# Patient Record
Sex: Male | Born: 1965 | ZIP: ~
Health system: Southern US, Community
[De-identification: ages and names within clinical notes are randomized; demographics above are authoritative.]

## PROBLEM LIST (undated history)

## (undated) DIAGNOSIS — K219 Gastro-esophageal reflux disease without esophagitis: Secondary | ICD-10-CM

## (undated) DIAGNOSIS — H35719 Central serous chorioretinopathy, unspecified eye: Secondary | ICD-10-CM

## (undated) DIAGNOSIS — J189 Pneumonia, unspecified organism: Secondary | ICD-10-CM

## (undated) DIAGNOSIS — E785 Hyperlipidemia, unspecified: Secondary | ICD-10-CM

## (undated) DIAGNOSIS — E859 Amyloidosis, unspecified: Secondary | ICD-10-CM

## (undated) DIAGNOSIS — I82890 Acute embolism and thrombosis of other specified veins: Secondary | ICD-10-CM

## (undated) DIAGNOSIS — R011 Cardiac murmur, unspecified: Secondary | ICD-10-CM

## (undated) DIAGNOSIS — H04129 Dry eye syndrome of unspecified lacrimal gland: Secondary | ICD-10-CM

## (undated) DIAGNOSIS — N289 Disorder of kidney and ureter, unspecified: Secondary | ICD-10-CM

## (undated) DIAGNOSIS — D759 Disease of blood and blood-forming organs, unspecified: Secondary | ICD-10-CM

## (undated) DIAGNOSIS — I10 Essential (primary) hypertension: Secondary | ICD-10-CM

## (undated) HISTORY — DX: Dry eye syndrome of unspecified lacrimal gland: H04.129

## (undated) HISTORY — DX: Central serous chorioretinopathy, unspecified eye: H35.719

## (undated) HISTORY — PX: EXPLORATORY LAPAROTOMY: SUR591

## (undated) HISTORY — PX: TONSILLECTOMY: SUR1361

## (undated) HISTORY — DX: Hyperlipidemia, unspecified: E78.5

---

## 2014-04-06 ENCOUNTER — Emergency Department: Payer: Self-pay | Admitting: Emergency Medicine

## 2014-04-06 LAB — BASIC METABOLIC PANEL
Anion Gap: 6 — ABNORMAL LOW (ref 7–16)
BUN: 39 mg/dL — AB (ref 7–18)
CALCIUM: 8.4 mg/dL — AB (ref 8.5–10.1)
Chloride: 111 mmol/L — ABNORMAL HIGH (ref 98–107)
Co2: 24 mmol/L (ref 21–32)
Creatinine: 2.76 mg/dL — ABNORMAL HIGH (ref 0.60–1.30)
EGFR (Non-African Amer.): 26 — ABNORMAL LOW
GFR CALC AF AMER: 32 — AB
Glucose: 104 mg/dL — ABNORMAL HIGH (ref 65–99)
Osmolality: 291 (ref 275–301)
Potassium: 5.2 mmol/L — ABNORMAL HIGH (ref 3.5–5.1)
Sodium: 141 mmol/L (ref 136–145)

## 2014-04-06 LAB — CBC WITH DIFFERENTIAL/PLATELET
BASOS ABS: 0.1 10*3/uL (ref 0.0–0.1)
Basophil %: 1.3 %
EOS ABS: 0.6 10*3/uL (ref 0.0–0.7)
Eosinophil %: 7.1 %
HCT: 30.7 % — ABNORMAL LOW (ref 40.0–52.0)
HGB: 9.9 g/dL — AB (ref 13.0–18.0)
Lymphocyte #: 2 10*3/uL (ref 1.0–3.6)
Lymphocyte %: 25.1 %
MCH: 27.2 pg (ref 26.0–34.0)
MCHC: 32.2 g/dL (ref 32.0–36.0)
MCV: 84 fL (ref 80–100)
MONOS PCT: 9 %
Monocyte #: 0.7 x10 3/mm (ref 0.2–1.0)
NEUTROS PCT: 57.5 %
Neutrophil #: 4.6 10*3/uL (ref 1.4–6.5)
PLATELETS: 273 10*3/uL (ref 150–440)
RBC: 3.64 10*6/uL — ABNORMAL LOW (ref 4.40–5.90)
RDW: 16 % — ABNORMAL HIGH (ref 11.5–14.5)
WBC: 8.1 10*3/uL (ref 3.8–10.6)

## 2014-04-06 LAB — MAGNESIUM: MAGNESIUM: 1.8 mg/dL

## 2014-04-06 LAB — PHOSPHORUS: Phosphorus: 3.9 mg/dL (ref 2.5–4.9)

## 2014-06-26 HISTORY — PX: IMPLANTATION / PLACEMENT PERMANENT EPIDURAL CATHETER: SUR689

## 2014-08-25 ENCOUNTER — Ambulatory Visit: Payer: Self-pay | Admitting: Nephrology

## 2014-08-28 ENCOUNTER — Ambulatory Visit (INDEPENDENT_AMBULATORY_CARE_PROVIDER_SITE_OTHER): Payer: BLUE CROSS/BLUE SHIELD | Admitting: Internal Medicine

## 2014-08-28 DIAGNOSIS — Z7189 Other specified counseling: Secondary | ICD-10-CM

## 2014-08-28 DIAGNOSIS — Z7184 Encounter for health counseling related to travel: Secondary | ICD-10-CM | POA: Insufficient documentation

## 2014-08-28 DIAGNOSIS — Z23 Encounter for immunization: Secondary | ICD-10-CM | POA: Diagnosis not present

## 2014-08-28 MED ORDER — DEXTRAN 70-DEXTROSE SOLN: 1.0000 mL | Freq: Once | Status: DC

## 2014-08-28 MED ORDER — PANTOPRAZOLE SODIUM 40 MG PO TBEC: 40.0000 mg | DELAYED_RELEASE_TABLET | Freq: Every day | ORAL | Status: DC

## 2014-08-28 MED ORDER — ROSUVASTATIN CALCIUM 10 MG PO TABS: 10.0000 mg | ORAL_TABLET | Freq: Every day | ORAL | Status: DC

## 2014-08-28 MED ORDER — CYCLOSPORINE 0.05 % OP EMUL: 1.0000 [drp] | Freq: Two times a day (BID) | OPHTHALMIC | Status: DC

## 2014-08-28 MED ORDER — FERROUS SULFATE 325 (65 FE) MG PO TABS: 325.0000 mg | ORAL_TABLET | Freq: Three times a day (TID) | ORAL | Status: DC

## 2014-08-28 MED ORDER — IRBESARTAN 150 MG PO TABS: 150.0000 mg | ORAL_TABLET | Freq: Every day | ORAL | Status: DC

## 2014-08-28 MED ORDER — AZITHROMYCIN 500 MG PO TABS: 1000.0000 mg | ORAL_TABLET | Freq: Once | ORAL | Status: DC

## 2014-08-28 NOTE — Patient Instructions (Signed)
[facility]                [address], [place]                   [place], [place] [address]                      Phone: [phone]                        Fax: [phone]   Planned departure date: August 29, 2014          Planned return date: September 12, 2014 Countries of travel: [place]   Guidelines for the Prevention & Treatment of Traveler's Diarrhea  Prevention: "Boil it, Peel it, Lacinda Axon it, or Forget it"   the fewer chances -> lower risk: try to stick to food & water precautions as much as possible"   If it's "piping hot"; it is probably okay, if not, it may not be   Treatment   1) You should always take care to drink lots of fluids in order to avoid dehydration   2) You should bring medications with you in case you come down with a case of diarrhea   3) OTC = bring pepto-bismol - can take with initial abdominal symptoms;                    Imodium - can help slow down your intestinal tract, can help relief cramps                    and diarrhea, can take if no bloody diarrhea  Use azithromycin if needed for traveler's diarrhea  Guidelines for the Prevention of Malaria  Avoidance:  -fewer mosquito bites = lower risk. Mosquitos can bite at night as well as daytime  -cover up (long sleeve clothing), mosquito nets, screens  -Insect repellent for your skin ( DEET containing lotion > 20%): for clothes ( permethrin spray)    medication not indicated for malaria prevention.   Immunizations received today: Typhoid (parenteral)  Future immunizations, if indicated none indicated   Prior to travel:  1) Be sure to pick up appropriate prescriptions, including medicine you take daily. Do not expect to be able to fill your prescriptions abroad.  2) Strongly consider obtaining traveler's insurance, including emergency evacuation insurance. Most plans in the [place] do not cover participants abroad. (see below for resources)  3) Register at  the appropriate [place] embassy or consulate with travel dates so they are aware of your presence in-country and for helpful advice during travel using the [employer] (STEP, GreenNylon.com.cy).  4) Leave contact information with a relative or friend.  5) Keep a Research officer, political party, credit cards in case they become lost or stolen  6) Inform your credit card company that you will be travelling abroad   During travel:  1) If you become ill and need medical advice, the [place] [employer]	[place] of the country you are traveling in general provides a list of [place] speaking doctors.  We are also available on MyChart for remote consultation if you register prior to travel. 2) Avoid motorcycles or scooters when at all possible. Traffic laws in many countries are lax and accidents occur frequently.  3) Do not take any unnecessary risks that you wouldn't do at home.   Resources:  -Country specific information:  BlindResource.ca or GreenNylon.com.cy  -Travel Supplies (DEET, mosquito nets): [employer], [employer] store, [employer], [place] insurance options: [place].com; http://clayton-rivera.info/; travelguard.com or [employer], gninsurance.com or info@gninsurance .com, H1235423.   Post Travel:  If you return from your trip ill, call your primary care doctor or our travel clinic @ [phone].   Enjoy your trip and know that with proper pre-travel preparation, most people have an enjoyable and uninterrupted trip!

## 2014-08-28 NOTE — Progress Notes (Signed)
Subjective:   Joseph [name] is a 49 y.o. male who presents to the Infectious Disease clinic for travel consultation. Planned departure date: March 5          Planned return date: March 19 Countries of travel: [place] Areas in country: rural   Accommodations: hotel Purpose of travel: vacation Prior travel out of [place]: yes     Objective:   Medications:reviewed    Assessment:    No contraindications to travel. none     Plan:    Issues discussed: altitude illness, environmental concerns, future shots, insect-borne illnesses, Japanese encephalitis, malaria, MVA safety, rabies, safe food/water, traveler's diarrhea, website/handouts for more information, what to do if ill upon return and what to do if ill while there. Immunizations recommended: Typhoid (parenteral). Malaria prophylaxis: not indicated Traveler's diarrhea prophylaxis: azithromycin. Total duration of visit: 1 Hour. Total time spent on education, counseling, coordination of care: 30 Minutes.

## 2015-03-22 ENCOUNTER — Other Ambulatory Visit (HOSPITAL_COMMUNITY): Payer: Self-pay | Admitting: Nephrology

## 2015-03-22 DIAGNOSIS — R809 Proteinuria, unspecified: Secondary | ICD-10-CM

## 2015-03-22 DIAGNOSIS — N184 Chronic kidney disease, stage 4 (severe): Secondary | ICD-10-CM

## 2015-04-05 ENCOUNTER — Encounter
Admission: RE | Admit: 2015-04-05 | Discharge: 2015-04-05 | Disposition: A | Discharge status: Home / Self Care | Payer: BLUE CROSS/BLUE SHIELD | Source: Ambulatory Visit | Attending: Nephrology | Admitting: Nephrology

## 2015-04-05 LAB — DIFFERENTIAL
Basophils Absolute: 0.1 10*3/uL (ref 0–0.1)
Basophils Relative: 1 %
EOS PCT: 7 %
Eosinophils Absolute: 0.7 10*3/uL (ref 0–0.7)
LYMPHS ABS: 2.5 10*3/uL (ref 1.0–3.6)
LYMPHS PCT: 26 %
MONO ABS: 0.6 10*3/uL (ref 0.2–1.0)
MONOS PCT: 7 %
Neutro Abs: 5.6 10*3/uL (ref 1.4–6.5)
Neutrophils Relative %: 59 %

## 2015-04-05 LAB — URINALYSIS COMPLETE WITH MICROSCOPIC (ARMC ONLY)
BACTERIA UA: NONE SEEN
Bilirubin Urine: NEGATIVE
GLUCOSE, UA: NEGATIVE mg/dL
Hgb urine dipstick: NEGATIVE
Ketones, ur: NEGATIVE mg/dL
Leukocytes, UA: NEGATIVE
Nitrite: NEGATIVE
PROTEIN: 100 mg/dL — AB
SQUAMOUS EPITHELIAL / LPF: NONE SEEN
Specific Gravity, Urine: 1.011 (ref 1.005–1.030)
pH: 5 (ref 5.0–8.0)

## 2015-04-05 LAB — COMPREHENSIVE METABOLIC PANEL
ALT: 29 U/L (ref 17–63)
AST: 20 U/L (ref 15–41)
Albumin: 4.1 g/dL (ref 3.5–5.0)
Alkaline Phosphatase: 129 U/L — ABNORMAL HIGH (ref 38–126)
Anion gap: 4 — ABNORMAL LOW (ref 5–15)
BUN: 49 mg/dL — AB (ref 6–20)
CHLORIDE: 114 mmol/L — AB (ref 101–111)
CO2: 23 mmol/L (ref 22–32)
Calcium: 9.3 mg/dL (ref 8.9–10.3)
Creatinine, Ser: 2.79 mg/dL — ABNORMAL HIGH (ref 0.61–1.24)
GFR calc non Af Amer: 25 mL/min — ABNORMAL LOW (ref >60)
GFR, EST AFRICAN AMERICAN: 29 mL/min — AB (ref >60)
Glucose, Bld: 106 mg/dL — ABNORMAL HIGH (ref 65–99)
Potassium: 5.3 mmol/L — ABNORMAL HIGH (ref 3.5–5.1)
Sodium: 141 mmol/L (ref 135–145)
Total Bilirubin: 0.5 mg/dL (ref 0.3–1.2)
Total Protein: 7.5 g/dL (ref 6.5–8.1)

## 2015-04-05 LAB — CBC
HEMATOCRIT: 32 % — AB (ref 40.0–52.0)
Hemoglobin: 10.2 g/dL — ABNORMAL LOW (ref 13.0–18.0)
MCH: 27.5 pg (ref 26.0–34.0)
MCHC: 31.8 g/dL — ABNORMAL LOW (ref 32.0–36.0)
MCV: 86.6 fL (ref 80.0–100.0)
PLATELETS: 246 10*3/uL (ref 150–440)
RBC: 3.7 MIL/uL — AB (ref 4.40–5.90)
RDW: 15 % — ABNORMAL HIGH (ref 11.5–14.5)
WBC: 9.4 10*3/uL (ref 3.8–10.6)

## 2015-04-05 LAB — PROTEIN / CREATININE RATIO, URINE
Creatinine, Urine: 111 mg/dL
PROTEIN CREATININE RATIO: 0.89 mg/mg{creat} — AB (ref 0.00–0.15)
Total Protein, Urine: 99 mg/dL

## 2015-04-05 LAB — PROTIME-INR
INR: 1.04
Prothrombin Time: 13.8 seconds (ref 11.4–15.0)

## 2015-04-05 LAB — ABO/RH: ABO/RH(D): O POS

## 2015-04-06 ENCOUNTER — Observation Stay: Payer: BLUE CROSS/BLUE SHIELD

## 2015-04-06 ENCOUNTER — Inpatient Hospital Stay
Admission: AD | Admit: 2015-04-06 | Discharge: 2015-04-21 | DRG: 907 | Disposition: A | Discharge status: Home / Self Care | Payer: BLUE CROSS/BLUE SHIELD | Source: Ambulatory Visit | Attending: Internal Medicine | Admitting: Internal Medicine

## 2015-04-06 DIAGNOSIS — N179 Acute kidney failure, unspecified: Secondary | ICD-10-CM | POA: Diagnosis present

## 2015-04-06 DIAGNOSIS — R109 Unspecified abdominal pain: Secondary | ICD-10-CM

## 2015-04-06 DIAGNOSIS — D65 Disseminated intravascular coagulation [defibrination syndrome]: Secondary | ICD-10-CM | POA: Diagnosis not present

## 2015-04-06 DIAGNOSIS — S37002A Unspecified injury of left kidney, initial encounter: Secondary | ICD-10-CM

## 2015-04-06 DIAGNOSIS — E875 Hyperkalemia: Secondary | ICD-10-CM | POA: Diagnosis present

## 2015-04-06 DIAGNOSIS — N059 Unspecified nephritic syndrome with unspecified morphologic changes: Secondary | ICD-10-CM

## 2015-04-06 DIAGNOSIS — E859 Amyloidosis, unspecified: Secondary | ICD-10-CM

## 2015-04-06 DIAGNOSIS — R809 Proteinuria, unspecified: Secondary | ICD-10-CM

## 2015-04-06 DIAGNOSIS — E872 Acidosis: Secondary | ICD-10-CM | POA: Diagnosis present

## 2015-04-06 DIAGNOSIS — Y838 Other surgical procedures as the cause of abnormal reaction of the patient, or of later complication, without mention of misadventure at the time of the procedure: Secondary | ICD-10-CM | POA: Diagnosis present

## 2015-04-06 DIAGNOSIS — Z8711 Personal history of peptic ulcer disease: Secondary | ICD-10-CM

## 2015-04-06 DIAGNOSIS — I120 Hypertensive chronic kidney disease with stage 5 chronic kidney disease or end stage renal disease: Secondary | ICD-10-CM | POA: Diagnosis present

## 2015-04-06 DIAGNOSIS — Z881 Allergy status to other antibiotic agents status: Secondary | ICD-10-CM

## 2015-04-06 DIAGNOSIS — E785 Hyperlipidemia, unspecified: Secondary | ICD-10-CM | POA: Diagnosis present

## 2015-04-06 DIAGNOSIS — I10 Essential (primary) hypertension: Secondary | ICD-10-CM | POA: Diagnosis not present

## 2015-04-06 DIAGNOSIS — N184 Chronic kidney disease, stage 4 (severe): Secondary | ICD-10-CM

## 2015-04-06 DIAGNOSIS — S37019A Minor contusion of unspecified kidney, initial encounter: Secondary | ICD-10-CM | POA: Diagnosis present

## 2015-04-06 DIAGNOSIS — J90 Pleural effusion, not elsewhere classified: Secondary | ICD-10-CM

## 2015-04-06 DIAGNOSIS — N99840 Postprocedural hematoma of a genitourinary system organ or structure following a genitourinary system procedure: Principal | ICD-10-CM | POA: Diagnosis present

## 2015-04-06 DIAGNOSIS — R05 Cough: Secondary | ICD-10-CM

## 2015-04-06 DIAGNOSIS — D631 Anemia in chronic kidney disease: Secondary | ICD-10-CM | POA: Diagnosis present

## 2015-04-06 DIAGNOSIS — Z87891 Personal history of nicotine dependence: Secondary | ICD-10-CM

## 2015-04-06 DIAGNOSIS — D62 Acute posthemorrhagic anemia: Secondary | ICD-10-CM | POA: Diagnosis not present

## 2015-04-06 DIAGNOSIS — R0902 Hypoxemia: Secondary | ICD-10-CM

## 2015-04-06 DIAGNOSIS — R739 Hyperglycemia, unspecified: Secondary | ICD-10-CM | POA: Diagnosis not present

## 2015-04-06 DIAGNOSIS — R578 Other shock: Secondary | ICD-10-CM | POA: Diagnosis not present

## 2015-04-06 DIAGNOSIS — R059 Cough, unspecified: Secondary | ICD-10-CM

## 2015-04-06 DIAGNOSIS — E854 Organ-limited amyloidosis: Secondary | ICD-10-CM | POA: Diagnosis present

## 2015-04-06 DIAGNOSIS — N186 End stage renal disease: Secondary | ICD-10-CM | POA: Diagnosis present

## 2015-04-06 DIAGNOSIS — S37012A Minor contusion of left kidney, initial encounter: Secondary | ICD-10-CM | POA: Diagnosis present

## 2015-04-06 HISTORY — PX: RENAL BIOPSY, PERCUTANEOUS: SUR144

## 2015-04-06 HISTORY — DX: Essential (primary) hypertension: I10

## 2015-04-06 HISTORY — DX: Cardiac murmur, unspecified: R01.1

## 2015-04-06 LAB — CBC
HCT: 21.3 % — ABNORMAL LOW (ref 40.0–52.0)
HEMATOCRIT: 28.8 % — AB (ref 40.0–52.0)
HEMOGLOBIN: 9.2 g/dL — AB (ref 13.0–18.0)
Hemoglobin: 6.6 g/dL — ABNORMAL LOW (ref 13.0–18.0)
MCH: 27.5 pg (ref 26.0–34.0)
MCH: 27.3 pg (ref 26.0–34.0)
MCHC: 31.8 g/dL — ABNORMAL LOW (ref 32.0–36.0)
MCHC: 31 g/dL — AB (ref 32.0–36.0)
MCV: 86.4 fL (ref 80.0–100.0)
MCV: 88 fL (ref 80.0–100.0)
PLATELETS: 190 10*3/uL (ref 150–440)
Platelets: 244 10*3/uL (ref 150–440)
RBC: 3.34 MIL/uL — AB (ref 4.40–5.90)
RBC: 2.42 MIL/uL — ABNORMAL LOW (ref 4.40–5.90)
RDW: 15.3 % — AB (ref 11.5–14.5)
RDW: 15.3 % — AB (ref 11.5–14.5)
WBC: 18.6 10*3/uL — AB (ref 3.8–10.6)
WBC: 15.6 10*3/uL — ABNORMAL HIGH (ref 3.8–10.6)

## 2015-04-06 LAB — PREPARE RBC (CROSSMATCH)

## 2015-04-06 MED ORDER — HYDROMORPHONE HCL 1 MG/ML IJ SOLN
1.0000 mg | INTRAMUSCULAR | Status: DC | PRN
  Administered 2015-04-06: 16:00:00 1 mg via INTRAVENOUS
  Filled 2015-04-06: qty 1

## 2015-04-06 MED ORDER — SODIUM CHLORIDE 0.9 % IV SOLN
Freq: Once | INTRAVENOUS | Status: AC
  Administered 2015-04-06: 23:00:00 via INTRAVENOUS

## 2015-04-06 MED ORDER — MORPHINE SULFATE (PF) 2 MG/ML IV SOLN: 2.0000 mg | INTRAVENOUS | Status: DC | PRN

## 2015-04-06 MED ORDER — ONDANSETRON HCL 4 MG/2ML IJ SOLN
4.0000 mg | Freq: Three times a day (TID) | INTRAMUSCULAR | Status: DC | PRN
  Administered 2015-04-06 (×2): 4 mg via INTRAVENOUS
  Administered 2015-04-07: 8 mg via INTRAVENOUS
  Administered 2015-04-07: 4 mg via INTRAVENOUS
  Filled 2015-04-06 (×3): qty 2

## 2015-04-06 MED ORDER — SODIUM CHLORIDE 0.9 % IV SOLN
INTRAVENOUS | Status: DC
  Administered 2015-04-06: 10:00:00 via INTRAVENOUS

## 2015-04-06 MED ORDER — MORPHINE SULFATE (PF) 2 MG/ML IV SOLN
INTRAVENOUS | Status: AC
  Filled 2015-04-06: qty 1

## 2015-04-06 MED ORDER — ACETAMINOPHEN 500 MG PO TABS: 500.0000 mg | ORAL_TABLET | Freq: Four times a day (QID) | ORAL | Status: DC | PRN

## 2015-04-06 MED ORDER — SODIUM CHLORIDE 0.9 % IV BOLUS (SEPSIS)
1000.0000 mL | Freq: Once | INTRAVENOUS | Status: AC
  Administered 2015-04-06: 22:00:00 1000 mL via INTRAVENOUS

## 2015-04-06 MED ORDER — HYDROMORPHONE HCL 1 MG/ML IJ SOLN
1.0000 mg | INTRAMUSCULAR | Status: DC | PRN
  Filled 2015-04-06: qty 2

## 2015-04-06 MED ORDER — SODIUM CHLORIDE 0.9 % IV SOLN
INTRAVENOUS | Status: DC
  Administered 2015-04-06: 15:00:00 via INTRAVENOUS

## 2015-04-06 MED ORDER — MORPHINE SULFATE (PF) 2 MG/ML IV SOLN
2.0000 mg | INTRAVENOUS | Status: DC | PRN
  Administered 2015-04-06: 2 mg via INTRAVENOUS

## 2015-04-06 MED ORDER — MORPHINE SULFATE (PF) 4 MG/ML IV SOLN
INTRAVENOUS | Status: AC
  Filled 2015-04-06: qty 1

## 2015-04-06 MED ORDER — ACETAMINOPHEN-CODEINE #3 300-30 MG PO TABS
1.0000 | ORAL_TABLET | ORAL | Status: DC | PRN
Start: 2015-04-06 — End: 2015-04-06
  Administered 2015-04-06: 13:00:00 2 via ORAL
  Filled 2015-04-06: qty 2

## 2015-04-06 MED ORDER — OXYCODONE-ACETAMINOPHEN 5-325 MG PO TABS
1.0000 | ORAL_TABLET | ORAL | Status: DC | PRN
  Administered 2015-04-10 – 2015-04-14 (×3): 1 via ORAL
  Administered 2015-04-15 (×2): 2 via ORAL
  Administered 2015-04-15: 1 via ORAL
  Administered 2015-04-15 – 2015-04-17 (×9): 2 via ORAL
  Administered 2015-04-17: 1 via ORAL
  Administered 2015-04-18 – 2015-04-20 (×12): 2 via ORAL
  Administered 2015-04-20: 1 via ORAL
  Administered 2015-04-20 – 2015-04-21 (×4): 2 via ORAL
  Filled 2015-04-06 (×5): qty 2
  Filled 2015-04-06: qty 1
  Filled 2015-04-06 (×4): qty 2
  Filled 2015-04-06: qty 1
  Filled 2015-04-06 (×10): qty 2
  Filled 2015-04-06: qty 1
  Filled 2015-04-06 (×8): qty 2
  Filled 2015-04-06: qty 1
  Filled 2015-04-06 (×2): qty 2

## 2015-04-06 MED ORDER — HYDROMORPHONE HCL 1 MG/ML IJ SOLN
1.0000 mg | INTRAMUSCULAR | Status: DC | PRN
  Administered 2015-04-07 – 2015-04-15 (×15): 1 mg via INTRAVENOUS
  Filled 2015-04-06 (×17): qty 1

## 2015-04-06 MED ORDER — MORPHINE SULFATE (PF) 4 MG/ML IV SOLN: 4.0000 mg | INTRAVENOUS | Status: DC | PRN

## 2015-04-06 MED ORDER — HYDROMORPHONE HCL 1 MG/ML IJ SOLN
1.5000 mg | INTRAMUSCULAR | Status: DC | PRN
  Administered 2015-04-06 (×3): 1.5 mg via INTRAVENOUS
  Filled 2015-04-06 (×3): qty 2

## 2015-04-06 NOTE — Consult Note (Signed)
[facility] NAME: Joseph [name]    MR#:  [id number]  DATE OF BIRTH:  21-Dec-1965  DATE OF ADMISSION:  04/06/2015  PRIMARY CARE PHYSICIAN: Pcp Not In System   REQUESTING/REFERRING PHYSICIAN: Lateef  CHIEF COMPLAINT:  No chief complaint on file. Hypotension after renal biopsy  HISTORY OF PRESENT ILLNESS:  Joseph [name]  is a 49 y.o. male with a known history of essential hypertension who originally presented 10/11 for routine renal biopsy. Underwent procedure without immediate complication however, unfortunately developed pain in the left flank CT imaging revealed expanding subcapsular hematoma. They had difficulty managing his pain throughout the day until he finally ended on 1.5 mg of Dilaudid IV every 2 hours. Fortunately this controlled his pain however he is now hypotensive.Evaluated At bedside he is currently without any complaints  PAST MEDICAL HISTORY:   Past Medical History  Diagnosis Date  . Allergy   . Heart murmur   . Hypertension     PAST SURGICAL HISTOIRY:   Past Surgical History  Procedure Laterality Date  . Renal biopsy, percutaneous  04/06/2015         SOCIAL HISTORY:   Social History  Substance Use Topics  . Smoking status: Former Smoker    Quit date: 11/04/2006  . Smokeless tobacco: Not on file  . Alcohol Use: Not on file    FAMILY HISTORY:  History reviewed. No pertinent family history.  DRUG ALLERGIES:   Allergies  Allergen Reactions  . Ciprofloxacin Hcl   . Doxycycline Hyclate     REVIEW OF SYSTEMS:  CONSTITUTIONAL: No fever, fatigue or weakness.  EYES: No blurred or double vision.  EARS, NOSE, AND THROAT: No tinnitus or ear pain.  RESPIRATORY: No cough, shortness of breath, wheezing or hemoptysis.  CARDIOVASCULAR: No chest pain, orthopnea, edema.  GASTROINTESTINAL: No nausea, vomiting, diarrhea or abdominal pain.  GENITOURINARY: No dysuria, hematuria.  ENDOCRINE: No polyuria,  nocturia,  HEMATOLOGY: No anemia, easy bruising or bleeding SKIN: No rash or lesion. MUSCULOSKELETAL: No joint pain or arthritis.   NEUROLOGIC: No tingling, numbness, weakness.  PSYCHIATRY: No anxiety or depression.   MEDICATIONS AT HOME:   Prior to Admission medications   Medication Sig Start Date End Date Taking? Authorizing Provider  cycloSPORINE (RESTASIS) 0.05 % ophthalmic emulsion Place 1 drop into both eyes 2 (two) times daily. 08/28/14  Yes Thayer Headings, MD  ferrous sulfate 325 (65 FE) MG tablet Take 1 tablet (325 mg total) by mouth 3 (three) times daily with meals. 08/28/14  Yes Thayer Headings, MD  irbesartan (AVAPRO) 150 MG tablet Take 1 tablet (150 mg total) by mouth daily. 08/28/14  Yes Thayer Headings, MD  pantoprazole (PROTONIX) 40 MG tablet Take 1 tablet (40 mg total) by mouth daily. 08/28/14  Yes Thayer Headings, MD  Patiromer Sorbitex Calcium 25.2 G PACK Take 25.2 mg by mouth 1 day or 1 dose.   Yes Historical Provider, MD  rosuvastatin (CRESTOR) 10 MG tablet Take 1 tablet (10 mg total) by mouth daily. 08/28/14  Yes Thayer Headings, MD  sodium bicarbonate 650 MG tablet Take 650 mg by mouth 4 (four) times daily.   Yes Historical Provider, MD      VITAL SIGNS:  Blood pressure 76/41, pulse 92, temperature 97.8 F (36.6 C), temperature source Oral, resp. rate 20, height 5\' 11"  (1.803 m), weight 198 lb (89.812 kg), SpO2 94 %.  PHYSICAL EXAMINATION:  GENERAL:  49 y.o.-year-old patient lying  in the bed with no acute distress.  EYES: Pupils equal, round, reactive to light and accommodation. No scleral icterus. Extraocular muscles intact.  HEENT: Head atraumatic, normocephalic. Oropharynx and nasopharynx clear.  NECK:  Supple, no jugular venous distention. No thyroid enlargement, no tenderness.  LUNGS: Normal breath sounds bilaterally, no wheezing, rales,rhonchi or crepitation. No use of accessory muscles of respiration.  CARDIOVASCULAR: S1, S2 normal. No murmurs, rubs, or gallops.   ABDOMEN: Soft, tender as expected without rebound or guarding, nondistended. Bowel sounds present. No organomegaly or mass.  EXTREMITIES: No pedal edema, cyanosis, or clubbing.  NEUROLOGIC: Cranial nerves II through XII are intact. Muscle strength 5/5 in all extremities. Sensation intact. Gait not checked.  PSYCHIATRIC: The patient is alert and oriented x 3.  SKIN: No obvious rash, lesion, or ulcer.   LABORATORY PANEL:   CBC  Recent Labs Lab 04/06/15 1458  WBC 18.6*  HGB 9.2*  HCT 28.8*  PLT 244   ------------------------------------------------------------------------------------------------------------------  Chemistries   Recent Labs Lab 04/05/15 0838  NA 141  K 5.3*  CL 114*  CO2 23  GLUCOSE 106*  BUN 49*  CREATININE 2.79*  CALCIUM 9.3  AST 20  ALT 29  ALKPHOS 129*  BILITOT 0.5   ------------------------------------------------------------------------------------------------------------------  Cardiac Enzymes No results for input(s): TROPONINI in the last 168 hours. ------------------------------------------------------------------------------------------------------------------  RADIOLOGY:  Ct Abdomen Pelvis Wo Contrast  04/06/2015   CLINICAL DATA:  Left sided severe pain after percutaneous renal biopsy today  EXAM: CT ABDOMEN AND PELVIS WITHOUT CONTRAST  TECHNIQUE: Multidetector CT imaging of the abdomen and pelvis was performed following the standard protocol without IV contrast.  COMPARISON:  Ultrasound from earlier the same day  FINDINGS: There is a moderately large mixed attenuation left subcapsular hematoma around the kidney laterally from upper to lower pole, measuring approximately 3.2 cm maximum thickness, resulting in mild compression of the left renal parenchyma. There are streaky left perinephric retroperitoneal opacities, extending primarily medially and superiorly toward the adrenal gland.  Visualized lung bases clear. Unremarkable liver,  gallbladder, spleen, pancreas, adrenal glands. Unenhanced CT was performed per clinician order. Lack of IV contrast limits sensitivity and specificity, especially for evaluation of abdominal/pelvic solid viscera. There is a fluid attenuation exophytic 3.9 lesion from the upper pole right kidney presumably renal cyst but incompletely characterized. No hydronephrosis. Stomach, small bowel, and colon are nondilated. Urinary bladder physiologically distended. Mild prostatic enlargement. Bilateral pelvic phleboliths. Scattered aortoiliac arterial plaque without aneurysm. Increased number of prominent para-aortic and aortocaval lymph nodes, none greater than 1 cm short axis diameter. No ascites. No free air. Minimal spondylitic changes in the lumbar spine. Prominent protrusion L1-2.  IMPRESSION: 1. Moderately large left subcapsular hematoma with mild compression of the left renal parenchyma. 2. Para-aortic and aortocaval retroperitoneal adenopathy, possibly reactive but nonspecific ; lymphoproliferative disease could have similar appearance. Critical Value/emergent results were called by telephone at the time of interpretation on 04/06/2015 at 3:11 pm to Dr. Inocente Salles LATEEF , who verbally acknowledged these results.   Electronically Signed   By: Lucrezia Europe M.D.   On: 04/06/2015 15:13   US Biopsy-no Radiologist  04/06/2015   CLINICAL DATA:    Ultrasound was utilized for biopsy by the requesting physician.     EKG:   Orders placed or performed in visit on 04/06/14  . EKG 12-Lead    IMPRESSION AND PLAN:   49 year old Caucasian gentleman history of essential hypertension presenting for routine kidney biopsy complicated by subcapsular hematoma  1. Subcapsular hematoma with hypotension:  Blood pressure is responsive to fluid bolus will continue IV fluids, follow blood pressure trend repeat CBC checking for worsening anemia. The fact that his pain is now well controlled and seems reassuring this is not worsening  of his bleeding. However if becomes unresponsive to IV fluids or drastically different CBC will have to repeat a CAT scan and possibly involve vascular surgery. In the meantime decrease the dose just pain medications as previously stated frontal water 1.5 mg 1 mg as he is narcotic nave. If pain control does become an issue started on PCA pump  2. GERD without esophagitis Protonix 3. Hyperlipidemia unspecified statin therapy 4. Venous thromboembolism prophylactic: SCDs avoid heparin given active bleed  All the records are reviewed and case discussed with Consulting provider. Management plans discussed with the patient, family and they are in agreement.  CODE STATUS: Full  TOTAL TIME TAKING CARE OF THIS PATIENT: 45 minutes.    Leeloo Silverthorne,  Karenann Cai.D on 04/06/2015 at 10:13 PM  Between 7am to 6pm - Pager - [phone]  After 6pm: House Pager: - [phone]  [facility]  Office  [phone]  CC: Primary care Physician: [place] Not In System

## 2015-04-06 NOTE — Progress Notes (Signed)
   04/06/15 0930  Clinical Encounter Type  Visited With Patient and family together  Visit Type Initial  Referral From Nurse  Consult/Referral To Chaplain  Spiritual Encounters  Spiritual Needs Literature  Advance Directives (For Healthcare)  Does patient have an advance directive? No  Would patient like information on creating an advanced directive? Yes - Educational materials given  Pt requested AD material. Instructions and material given. Pt will call when ready. Burley Saver [phone]

## 2015-04-06 NOTE — Plan of Care (Signed)
Problem: Discharge Progression Outcomes Goal: Other Discharge Outcomes/Goals Outcome: Progressing Plan of care progress to goal for: 1. Discharge Plan:         Plan to Return Home with wife after Discharge. 2. Pain:         Dilaudid 1.5mg  IV used for pain management after other pain alternative medications used without          success.   3. Hemodynamically Stable:         VSS.         Afebrile.         IVF's Initiated.           4. Complications:         Pain Management.         Zofran 4mg  IV given for N/V. 5. Diet:         Regular Diet. Zofran 4mg  IV given for N/V. 6. Activity:         Bedrest.

## 2015-04-06 NOTE — Progress Notes (Signed)
Pt seen at bedside, had severe acute left flank pain, CT scan abd/pelvis w/o contrast ordered, progression of hematoma noted, 3.2cm in maximal diameter.  Obtaining CBC now.  Was given morphine without much pain relief, just now gave dilaudid 1mg  IV x one.

## 2015-04-06 NOTE — Progress Notes (Addendum)
Informed by nursing staff patient asymptomatically hypotensive with systolic blood pressure into the 50s, with mild tachycardia heart rate of 103 He has been receiving IV Dilaudid 1.5 mg The patient has a known left subcapsular hematoma   IV fluid bolus, check CBC Possible etiology of hypotension includes pain medication versus worsening bleed We'll continue to follow closely  Patient evaluated at bedside - he is actually without complaint at this time Recheck blood pressure - while still receiving fluid bolus  Will decrease dosage of pain medicine to 1mg  as he is narcotic naive. If pain control become a problem will place on PCA pump

## 2015-04-06 NOTE — Procedures (Signed)
After obtaining informed consent, the patient was brought down to the ultrasound suite.  Subsequently the left kidney was identified under ultrasound.  The left flank was prepped and draped in standard sterile fashion.  Local anesthesia was achieved using 1% lidocaine.  Subsequently using an 18-gauge biopsy device and ultraound guidance, a total of three passes were made into the left kidney.  The specimens were submitted to pathology and deemed to be adequate for submission.  Small perinephric hematoma noted on post-biopsy images.    The patient tolerated the procedure very well.  He will return to his room for continued observation.   Estimated blood loss: None.  Complications:  None.

## 2015-04-07 ENCOUNTER — Observation Stay: Payer: BLUE CROSS/BLUE SHIELD

## 2015-04-07 DIAGNOSIS — N2889 Other specified disorders of kidney and ureter: Secondary | ICD-10-CM | POA: Diagnosis not present

## 2015-04-07 DIAGNOSIS — R809 Proteinuria, unspecified: Secondary | ICD-10-CM | POA: Diagnosis present

## 2015-04-07 DIAGNOSIS — J90 Pleural effusion, not elsewhere classified: Secondary | ICD-10-CM | POA: Diagnosis not present

## 2015-04-07 DIAGNOSIS — R578 Other shock: Secondary | ICD-10-CM | POA: Diagnosis not present

## 2015-04-07 DIAGNOSIS — N186 End stage renal disease: Secondary | ICD-10-CM | POA: Diagnosis present

## 2015-04-07 DIAGNOSIS — N99840 Postprocedural hematoma of a genitourinary system organ or structure following a genitourinary system procedure: Secondary | ICD-10-CM | POA: Diagnosis present

## 2015-04-07 DIAGNOSIS — Y838 Other surgical procedures as the cause of abnormal reaction of the patient, or of later complication, without mention of misadventure at the time of the procedure: Secondary | ICD-10-CM | POA: Diagnosis present

## 2015-04-07 DIAGNOSIS — N179 Acute kidney failure, unspecified: Secondary | ICD-10-CM | POA: Diagnosis present

## 2015-04-07 DIAGNOSIS — D631 Anemia in chronic kidney disease: Secondary | ICD-10-CM | POA: Diagnosis present

## 2015-04-07 DIAGNOSIS — J942 Hemothorax: Secondary | ICD-10-CM | POA: Diagnosis not present

## 2015-04-07 DIAGNOSIS — E875 Hyperkalemia: Secondary | ICD-10-CM | POA: Diagnosis present

## 2015-04-07 DIAGNOSIS — E785 Hyperlipidemia, unspecified: Secondary | ICD-10-CM | POA: Diagnosis present

## 2015-04-07 DIAGNOSIS — Z87891 Personal history of nicotine dependence: Secondary | ICD-10-CM | POA: Diagnosis not present

## 2015-04-07 DIAGNOSIS — R579 Shock, unspecified: Secondary | ICD-10-CM

## 2015-04-07 DIAGNOSIS — E854 Organ-limited amyloidosis: Secondary | ICD-10-CM | POA: Diagnosis present

## 2015-04-07 DIAGNOSIS — R5383 Other fatigue: Secondary | ICD-10-CM | POA: Diagnosis not present

## 2015-04-07 DIAGNOSIS — Z881 Allergy status to other antibiotic agents status: Secondary | ICD-10-CM | POA: Diagnosis not present

## 2015-04-07 DIAGNOSIS — D65 Disseminated intravascular coagulation [defibrination syndrome]: Secondary | ICD-10-CM | POA: Diagnosis not present

## 2015-04-07 DIAGNOSIS — R0902 Hypoxemia: Secondary | ICD-10-CM | POA: Diagnosis not present

## 2015-04-07 DIAGNOSIS — I120 Hypertensive chronic kidney disease with stage 5 chronic kidney disease or end stage renal disease: Secondary | ICD-10-CM | POA: Diagnosis present

## 2015-04-07 DIAGNOSIS — D500 Iron deficiency anemia secondary to blood loss (chronic): Secondary | ICD-10-CM | POA: Diagnosis present

## 2015-04-07 DIAGNOSIS — R0600 Dyspnea, unspecified: Secondary | ICD-10-CM | POA: Diagnosis not present

## 2015-04-07 DIAGNOSIS — R739 Hyperglycemia, unspecified: Secondary | ICD-10-CM | POA: Diagnosis not present

## 2015-04-07 DIAGNOSIS — I959 Hypotension, unspecified: Secondary | ICD-10-CM | POA: Diagnosis not present

## 2015-04-07 DIAGNOSIS — E872 Acidosis: Secondary | ICD-10-CM | POA: Diagnosis present

## 2015-04-07 DIAGNOSIS — Z8711 Personal history of peptic ulcer disease: Secondary | ICD-10-CM | POA: Diagnosis not present

## 2015-04-07 DIAGNOSIS — D62 Acute posthemorrhagic anemia: Secondary | ICD-10-CM

## 2015-04-07 DIAGNOSIS — S37019A Minor contusion of unspecified kidney, initial encounter: Secondary | ICD-10-CM | POA: Diagnosis present

## 2015-04-07 DIAGNOSIS — R531 Weakness: Secondary | ICD-10-CM | POA: Diagnosis not present

## 2015-04-07 DIAGNOSIS — S37012A Minor contusion of left kidney, initial encounter: Secondary | ICD-10-CM | POA: Diagnosis present

## 2015-04-07 LAB — COMPREHENSIVE METABOLIC PANEL
ALBUMIN: 3.4 g/dL — AB (ref 3.5–5.0)
ALT: 380 U/L — ABNORMAL HIGH (ref 17–63)
ANION GAP: 12 (ref 5–15)
AST: 223 U/L — AB (ref 15–41)
Alkaline Phosphatase: 92 U/L (ref 38–126)
BUN: 59 mg/dL — AB (ref 6–20)
CHLORIDE: 119 mmol/L — AB (ref 101–111)
CO2: 13 mmol/L — ABNORMAL LOW (ref 22–32)
Calcium: 8.2 mg/dL — ABNORMAL LOW (ref 8.9–10.3)
Creatinine, Ser: 4.86 mg/dL — ABNORMAL HIGH (ref 0.61–1.24)
GFR calc Af Amer: 15 mL/min — ABNORMAL LOW (ref >60)
GFR calc non Af Amer: 13 mL/min — ABNORMAL LOW (ref >60)
GLUCOSE: 161 mg/dL — AB (ref 65–99)
POTASSIUM: 7.4 mmol/L — AB (ref 3.5–5.1)
SODIUM: 144 mmol/L (ref 135–145)
Total Bilirubin: 0.4 mg/dL (ref 0.3–1.2)
Total Protein: 6.2 g/dL — ABNORMAL LOW (ref 6.5–8.1)

## 2015-04-07 LAB — CBC
HCT: 24.4 % — ABNORMAL LOW (ref 40.0–52.0)
HEMATOCRIT: 25.4 % — AB (ref 40.0–52.0)
HEMATOCRIT: 23 % — AB (ref 40.0–52.0)
HEMOGLOBIN: 8.2 g/dL — AB (ref 13.0–18.0)
HEMOGLOBIN: 7.5 g/dL — AB (ref 13.0–18.0)
Hemoglobin: 8.1 g/dL — ABNORMAL LOW (ref 13.0–18.0)
MCH: 27.7 pg (ref 26.0–34.0)
MCH: 28.3 pg (ref 26.0–34.0)
MCH: 28.1 pg (ref 26.0–34.0)
MCHC: 32.1 g/dL (ref 32.0–36.0)
MCHC: 32.4 g/dL (ref 32.0–36.0)
MCHC: 33.1 g/dL (ref 32.0–36.0)
MCV: 86.5 fL (ref 80.0–100.0)
MCV: 87.6 fL (ref 80.0–100.0)
MCV: 85 fL (ref 80.0–100.0)
PLATELETS: 93 10*3/uL — AB (ref 150–440)
Platelets: 180 10*3/uL (ref 150–440)
Platelets: 177 10*3/uL (ref 150–440)
RBC: 2.94 MIL/uL — AB (ref 4.40–5.90)
RBC: 2.63 MIL/uL — ABNORMAL LOW (ref 4.40–5.90)
RBC: 2.87 MIL/uL — AB (ref 4.40–5.90)
RDW: 15.6 % — ABNORMAL HIGH (ref 11.5–14.5)
RDW: 15.6 % — AB (ref 11.5–14.5)
RDW: 14.8 % — ABNORMAL HIGH (ref 11.5–14.5)
WBC: 16.4 10*3/uL — ABNORMAL HIGH (ref 3.8–10.6)
WBC: 13.7 10*3/uL — ABNORMAL HIGH (ref 3.8–10.6)
WBC: 17.4 10*3/uL — ABNORMAL HIGH (ref 3.8–10.6)

## 2015-04-07 LAB — RENAL FUNCTION PANEL
ALBUMIN: 3.4 g/dL — AB (ref 3.5–5.0)
ALBUMIN: 3 g/dL — AB (ref 3.5–5.0)
ANION GAP: 13 (ref 5–15)
Anion gap: 5 (ref 5–15)
BUN: 61 mg/dL — AB (ref 6–20)
BUN: 49 mg/dL — AB (ref 6–20)
CALCIUM: 8 mg/dL — AB (ref 8.9–10.3)
CO2: 11 mmol/L — AB (ref 22–32)
CO2: 26 mmol/L (ref 22–32)
CREATININE: 4.3 mg/dL — AB (ref 0.61–1.24)
Calcium: 8 mg/dL — ABNORMAL LOW (ref 8.9–10.3)
Chloride: 119 mmol/L — ABNORMAL HIGH (ref 101–111)
Chloride: 110 mmol/L (ref 101–111)
Creatinine, Ser: 5.11 mg/dL — ABNORMAL HIGH (ref 0.61–1.24)
GFR calc Af Amer: 14 mL/min — ABNORMAL LOW (ref >60)
GFR calc Af Amer: 17 mL/min — ABNORMAL LOW (ref >60)
GFR calc non Af Amer: 12 mL/min — ABNORMAL LOW (ref >60)
GFR, EST NON AFRICAN AMERICAN: 15 mL/min — AB (ref >60)
GLUCOSE: 157 mg/dL — AB (ref 65–99)
GLUCOSE: 124 mg/dL — AB (ref 65–99)
PHOSPHORUS: 5 mg/dL — AB (ref 2.5–4.6)
POTASSIUM: 7.4 mmol/L — AB (ref 3.5–5.1)
Phosphorus: 5.6 mg/dL — ABNORMAL HIGH (ref 2.5–4.6)
Potassium: 4.7 mmol/L (ref 3.5–5.1)
SODIUM: 143 mmol/L (ref 135–145)
SODIUM: 141 mmol/L (ref 135–145)

## 2015-04-07 LAB — FIBRIN DERIVATIVES D-DIMER (ARMC ONLY)

## 2015-04-07 LAB — MAGNESIUM
MAGNESIUM: 1.8 mg/dL (ref 1.7–2.4)
Magnesium: 1.6 mg/dL — ABNORMAL LOW (ref 1.7–2.4)
Magnesium: 2.3 mg/dL (ref 1.7–2.4)

## 2015-04-07 LAB — PROTIME-INR
INR: 1.28
INR: 1.34
PROTHROMBIN TIME: 16.2 s — AB (ref 11.4–15.0)
PROTHROMBIN TIME: 16.8 s — AB (ref 11.4–15.0)

## 2015-04-07 LAB — PREPARE RBC (CROSSMATCH)

## 2015-04-07 LAB — GLUCOSE, CAPILLARY: GLUCOSE-CAPILLARY: 107 mg/dL — AB (ref 65–99)

## 2015-04-07 LAB — PLATELET COUNT: PLATELETS: 148 10*3/uL — AB (ref 150–440)

## 2015-04-07 LAB — FIBRIN DEGRADATION PROD.(ARMC ONLY): FIBRIN DEGRADATION PROD.: POSITIVE ug/mL — AB (ref <10)

## 2015-04-07 LAB — APTT: aPTT: 28 seconds (ref 24–36)

## 2015-04-07 LAB — MRSA PCR SCREENING: MRSA BY PCR: NEGATIVE

## 2015-04-07 LAB — FIBRINOGEN: Fibrinogen: 332 mg/dL (ref 210–470)

## 2015-04-07 MED ORDER — SODIUM CHLORIDE 0.9 % IV SOLN
8.0000 mg | Freq: Once | INTRAVENOUS | Status: AC
  Administered 2015-04-07: 8 mg via INTRAVENOUS
  Filled 2015-04-07: qty 4

## 2015-04-07 MED ORDER — SODIUM CHLORIDE 0.9 % IV SOLN
8.0000 mg | Freq: Three times a day (TID) | INTRAVENOUS | Status: DC | PRN
  Filled 2015-04-07: qty 4

## 2015-04-07 MED ORDER — PUREFLOW DIALYSIS SOLUTION
INTRAVENOUS | Status: DC
  Administered 2015-04-07: 15000 mL via INTRAVENOUS_CENTRAL

## 2015-04-07 MED ORDER — SODIUM CHLORIDE 0.9 % IV BOLUS (SEPSIS)
1000.0000 mL | Freq: Once | INTRAVENOUS | Status: AC
  Administered 2015-04-07: 1000 mL via INTRAVENOUS

## 2015-04-07 MED ORDER — SODIUM CHLORIDE 0.9 % IV SOLN
Freq: Once | INTRAVENOUS | Status: AC
  Administered 2015-04-07: 11:00:00 via INTRAVENOUS

## 2015-04-07 MED ORDER — HEPARIN SODIUM (PORCINE) 1000 UNIT/ML DIALYSIS
1000.0000 [IU] | INTRAMUSCULAR | Status: DC | PRN
  Administered 2015-04-09: 1000 [IU] via INTRAVENOUS_CENTRAL
  Filled 2015-04-07 (×5): qty 6

## 2015-04-07 MED ORDER — MAGNESIUM SULFATE 2 GM/50ML IV SOLN
2.0000 g | Freq: Once | INTRAVENOUS | Status: AC
  Administered 2015-04-07: 2 g via INTRAVENOUS
  Filled 2015-04-07: qty 50

## 2015-04-07 MED ORDER — STERILE WATER FOR INJECTION IV SOLN
INTRAVENOUS | Status: DC
  Administered 2015-04-07 (×2): via INTRAVENOUS
  Filled 2015-04-07 (×4): qty 850

## 2015-04-07 MED ORDER — SODIUM CHLORIDE 0.9 % IV SOLN
1.0000 g | Freq: Once | INTRAVENOUS | Status: AC
  Administered 2015-04-07: 06:00:00 1 g via INTRAVENOUS
  Filled 2015-04-07: qty 10

## 2015-04-07 MED ORDER — CETYLPYRIDINIUM CHLORIDE 0.05 % MT LIQD
7.0000 mL | Freq: Two times a day (BID) | OROMUCOSAL | Status: DC
  Administered 2015-04-08 – 2015-04-20 (×19): 7 mL via OROMUCOSAL

## 2015-04-07 MED ORDER — INFLUENZA VAC SPLIT QUAD 0.5 ML IM SUSY: 0.5000 mL | PREFILLED_SYRINGE | INTRAMUSCULAR | Status: DC

## 2015-04-07 MED ORDER — PUREFLOW DIALYSIS SOLUTION
INTRAVENOUS | Status: DC
  Administered 2015-04-07 – 2015-04-08 (×2): via INTRAVENOUS_CENTRAL

## 2015-04-07 MED ORDER — ONDANSETRON HCL 4 MG/2ML IJ SOLN
INTRAMUSCULAR | Status: AC
  Administered 2015-04-07: 8 mg via INTRAVENOUS
  Filled 2015-04-07: qty 4

## 2015-04-07 MED ORDER — SODIUM BICARBONATE 8.4 % IV SOLN
100.0000 meq | INTRAVENOUS | Status: AC
  Administered 2015-04-07: 100 meq via INTRAVENOUS
  Filled 2015-04-07 (×2): qty 100

## 2015-04-07 NOTE — Progress Notes (Signed)
Spoke with Dr. Holley Raring about pt hemoglobin increase to 8.2 but BP is 63/44. Stat CT scan of abdomen and pelvis , CMP ordered and another 1L NS bolus. Per MD, may consider transferring pt to ICU.

## 2015-04-07 NOTE — Progress Notes (Addendum)
[place] [facility] [place] NAME: Joseph [name]    MR#:  [id number]  DATE OF BIRTH:  [birth year]  SUBJECTIVE:  CHIEF COMPLAINT:  No chief complaint on file.  Nausea, but no abdominal pain or flank pain, no dysuria or hematuria. REVIEW OF SYSTEMS:  CONSTITUTIONAL: No fever, generalized weakness.  EYES: No blurred or double vision.  EARS, NOSE, AND THROAT: No tinnitus or ear pain.  RESPIRATORY: No cough, shortness of breath, wheezing or hemoptysis.  CARDIOVASCULAR: No chest pain, orthopnea, edema.  GASTROINTESTINAL: has nausea, no vomiting, diarrhea or abdominal pain.  GENITOURINARY: No dysuria, hematuria.  ENDOCRINE: No polyuria, nocturia,  HEMATOLOGY: No anemia, easy bruising or bleeding SKIN: No rash or lesion. MUSCULOSKELETAL: No joint pain or arthritis.   NEUROLOGIC: No tingling, numbness, weakness.  PSYCHIATRY: No anxiety or depression.   DRUG ALLERGIES:   Allergies  Allergen Reactions  . Ciprofloxacin Hcl   . Doxycycline Hyclate     VITALS:  Blood pressure 91/64, pulse 94, temperature 97.6 F (36.4 C), temperature source Axillary, resp. rate 21, height 5\' 11"  (1.803 m), weight 92.2 kg (203 lb 4.2 oz), SpO2 97 %.  PHYSICAL EXAMINATION:  GENERAL:  49 y.o.-year-old patient lying in the bed with no acute distress.  EYES: Pupils equal, round, reactive to light and accommodation. No scleral icterus. Extraocular muscles intact.  HEENT: Head atraumatic, normocephalic. Oropharynx and nasopharynx clear. Moist oral mucosa. NECK:  Supple, no jugular venous distention. No thyroid enlargement, no tenderness.  LUNGS: Normal breath sounds bilaterally, no wheezing, rales,rhonchi or crepitation. No use of accessory muscles of respiration.  CARDIOVASCULAR: S1, S2 normal. No murmurs, rubs, or gallops.  ABDOMEN: Soft, nontender, nondistended. Bowel sounds present. No organomegaly or mass.  EXTREMITIES: No pedal edema, cyanosis, or clubbing.   NEUROLOGIC: Cranial nerves II through XII are intact. Muscle strength 4/5 in all extremities. Sensation intact. Gait not checked.  PSYCHIATRIC: The patient is alert and oriented x 3.  SKIN: No obvious rash, lesion, or ulcer.    LABORATORY PANEL:   CBC  Recent Labs Lab 04/07/15 0439 04/07/15 1055  WBC 13.7*  --   HGB 7.5*  --   HCT 23.0*  --   PLT 177 148*   ------------------------------------------------------------------------------------------------------------------  Chemistries   Recent Labs Lab 04/07/15 0439 04/07/15 1055  NA 144 143  K 7.4* 7.4*  CL 119* 119*  CO2 13* 11*  GLUCOSE 161* 157*  BUN 59* 61*  CREATININE 4.86* 5.11*  CALCIUM 8.2* 8.0*  MG 1.6* 2.3  AST 223*  --   ALT 380*  --   ALKPHOS 92  --   BILITOT 0.4  --    ------------------------------------------------------------------------------------------------------------------  Cardiac Enzymes No results for input(s): TROPONINI in the last 168 hours. ------------------------------------------------------------------------------------------------------------------  RADIOLOGY:  Ct Abdomen Pelvis Wo Contrast  04/07/2015  CLINICAL DATA:  Decreased blood pressure. Flank pain. Left renal biopsy yesterday. EXAM: CT ABDOMEN AND PELVIS WITHOUT CONTRAST TECHNIQUE: Multidetector CT imaging of the abdomen and pelvis was performed following the standard protocol without IV contrast. COMPARISON:  04/06/2015 FINDINGS: New development of small left pleural effusion with atelectasis in the left lung base. Linear atelectasis in both lung bases. Probable focal calcification in the left pleural space. Near circumferential acute appearing subcapsular hematoma around the left kidney. Maximal depth measures about 2.5 cm. Hematoma appears slightly smaller than on previous study. There is hemorrhage and infiltration diffusely around the left perirenal fat. This is also similar to prior study. Moderately enlarged lymph  nodes  in the retroperitoneum and periaortic region. These are nonspecific and could be reactive or metastatic. There is progression of hematoma and infiltration into the upper aspect of the retroperitoneum and in the gastrohepatic ligament as well as around the crus of the diaphragm and posterior subdiaphragmatic space since previous study. Small amount of new free fluid along the pericolic gutters and around the liver edge suggesting development of intraperitoneal hemorrhage. Small amount of fluid in the pelvis. The unenhanced appearance of the liver, spleen, gallbladder, pancreas, inferior vena cava are unremarkable. Calcification of the abdominal aorta without aneurysm. Exophytic cyst off the right kidney. No hydronephrosis in either kidney. Calcification of the abdominal aorta without aneurysm. Stomach, small bowel, and colon are not abnormally distended. Residual contrast material in the colon. No free air in the abdomen. Right adrenal gland is not enlarged. Left adrenal gland is obscured by the hemorrhage. Pelvis: Prostate gland appears mildly enlarged. Bladder wall is not thickened. No loculated pelvic fluid collections. No pelvic mass or lymphadenopathy. Appendix is not identified. No evidence of diverticulitis. With no destructive bone lesions. IMPRESSION: Moderately large subcapsular hematoma around the left kidney appears about the same as yesterday. Hemorrhagic stranding in the left perirenal fat is about the same. There is increasing hemorrhage in the retroperitoneum, celiac axis, gastrohepatic ligament, posterior subdiaphragmatic space, and with development of free fluid, likely intraperitoneal hemorrhage around the liver and extending into the pelvis. Enlarged retroperitoneal lymph nodes similar to previous study. New left pleural effusion with basilar atelectasis. These results were called by telephone at the time of interpretation on 04/07/2015 at 6:24 am to Dr. Anthonette Legato , who verbally acknowledged  these results. Electronically Signed   By: Lucienne Capers M.D.   On: 04/07/2015 06:31   Ct Abdomen Pelvis Wo Contrast  04/06/2015  CLINICAL DATA:  Left sided severe pain after percutaneous renal biopsy today EXAM: CT ABDOMEN AND PELVIS WITHOUT CONTRAST TECHNIQUE: Multidetector CT imaging of the abdomen and pelvis was performed following the standard protocol without IV contrast. COMPARISON:  Ultrasound from earlier the same day FINDINGS: There is a moderately large mixed attenuation left subcapsular hematoma around the kidney laterally from upper to lower pole, measuring approximately 3.2 cm maximum thickness, resulting in mild compression of the left renal parenchyma. There are streaky left perinephric retroperitoneal opacities, extending primarily medially and superiorly toward the adrenal gland. Visualized lung bases clear. Unremarkable liver, gallbladder, spleen, pancreas, adrenal glands. Unenhanced CT was performed per clinician order. Lack of IV contrast limits sensitivity and specificity, especially for evaluation of abdominal/pelvic solid viscera. There is a fluid attenuation exophytic 3.9 lesion from the upper pole right kidney presumably renal cyst but incompletely characterized. No hydronephrosis. Stomach, small bowel, and colon are nondilated. Urinary bladder physiologically distended. Mild prostatic enlargement. Bilateral pelvic phleboliths. Scattered aortoiliac arterial plaque without aneurysm. Increased number of prominent para-aortic and aortocaval lymph nodes, none greater than 1 cm short axis diameter. No ascites. No free air. Minimal spondylitic changes in the lumbar spine. Prominent protrusion L1-2. IMPRESSION: 1. Moderately large left subcapsular hematoma with mild compression of the left renal parenchyma. 2. Para-aortic and aortocaval retroperitoneal adenopathy, possibly reactive but nonspecific ; lymphoproliferative disease could have similar appearance. Critical Value/emergent results  were called by telephone at the time of interpretation on 04/06/2015 at 3:11 pm to Dr. Inocente Salles LATEEF , who verbally acknowledged these results. Electronically Signed   By: Lucrezia Europe M.D.   On: 04/06/2015 15:13   US Biopsy-no Radiologist  04/06/2015  CLINICAL DATA:  Ultrasound was utilized for biopsy by the requesting physician.    EKG:   Orders placed or performed in visit on 04/06/14  . EKG 12-Lead    ASSESSMENT AND PLAN:   1. Acute renal failure/CKD stage IV with left scapsular hematoma after renal biopsy.  Perinephric hematoma developed which was initially subcapsular, now with extracapsular bleeding.Per Dr. Holley Raring. Start CRRT today and may need to embolize kidney if bleeding doesn't stop.   * Hyperkalemia. K 7.4,  Calcium gluconate and bicarb iv, Continue CRRT and follow-up BMP Q4H.  2.  Hpotension: Blood pressure is better after fluid bolus.  3.Acute blood loss anemia due to 1. Hb increased from 6.6 to 7.5 after one unit prbc transfusion. Her CBC.  4. Hypomagnesemia. Improved after supplement.  I discussed with Dr. Holley Raring.  All the records are reviewed and case discussed with Care Management/Social Workerr. Management plans discussed with the patient, his wife and mother, and they are in agreement.  CODE STATUS: Full code.  TOTAL CRITICAL TIME TAKING CARE OF THIS PATIENT: 43 minutes.   POSSIBLE D/C IN 3 DAYS, DEPENDING ON CLINICAL CONDITION.   Demetrios Loll M.D on 04/07/2015 at 1:38 PM  Between 7am to 6pm - Pager - [phone]  After 6pm go to [email or web address] - password [email or web address] [facility]  Office  [phone]  CC: Primary care physician; Pcp Not In System

## 2015-04-07 NOTE — Progress Notes (Signed)
Paged and spoke with Dr Lavetta Nielsen about pt BP of 55/35. 1L NS  Bolus ordered and administered. CBC was ordered but pt already had CBC ordered for 2200. Lab was called and asked to expedite the blood draw.

## 2015-04-07 NOTE — Progress Notes (Signed)
Dr. Holley Raring came and saw pt and another liter bolus of NS was ordered and administered. Pt was transferred to [facility]. Report called and pt transported to the unit with mom in tow. Mom and wife is at bedside.

## 2015-04-07 NOTE — Plan of Care (Signed)
Problem: Discharge Progression Outcomes Goal: Other Discharge Outcomes/Goals Plan of Care Progress to Goal:   Pt BP was low all shift. Pt was asymptomatic. Pt report pain once during shift and was administered dilaudid. Pt hemoglobin was 6.6 and infuse 1 unit blood. Repeat CT of abdomen and pelvis was done this am. Pt was transfer to ICU at shift change this am.

## 2015-04-07 NOTE — Consult Note (Signed)
PULMONARY / CRITICAL CARE MEDICINE   Name: Joseph [name] MRN: [id number] DOB: 09-19-1965    ADMISSION DATE:  04/06/2015  INITIAL PRESENTATION:  67 M underwent elective L kidney bx 10/10 c/b severe hemorrhage and hemorrhagic shock  MAJOR EVENTS/TEST RESULTS: 10/11 CTAP: Moderately large left subcapsular hematoma with mild compression of the left renal parenchyma 10/12 CTAP: Moderately large subcapsular hematoma around the left kidney appears about the same as yesterday. Hemorrhagic stranding in the left perirenal fat is about the same. There is increasing hemorrhage in the retroperitoneum, celiac axis, gastrohepatic ligament, posterior subdiaphragmatic space, and with development of free fluid, likely intraperitoneal hemorrhage around the liver and extending into the pelvis. Enlarged retroperitoneal lymph nodes similar to previous study. New left pleural effusion with basilar atelectasis 10/12 CRRT initiated  INDWELLING DEVICES:: R femoral HS cath 10/11 >>    MICRO DATA:   ANTIMICROBIALS:    HISTORY OF PRESENT ILLNESS:   As above. Pt complains of flank and ab pain. Was in [place] prior to procedure  PAST MEDICAL HISTORY :   has a past medical history of Allergy; Heart murmur; and Hypertension.  has past surgical history that includes Renal biopsy, percutaneous (04/06/2015). Prior to Admission medications   Medication Sig Start Date End Date Taking? Authorizing Provider  cycloSPORINE (RESTASIS) 0.05 % ophthalmic emulsion Place 1 drop into both eyes 2 (two) times daily. 08/28/14  Yes Thayer Headings, MD  ferrous sulfate 325 (65 FE) MG tablet Take 1 tablet (325 mg total) by mouth 3 (three) times daily with meals. 08/28/14  Yes Thayer Headings, MD  irbesartan (AVAPRO) 150 MG tablet Take 1 tablet (150 mg total) by mouth daily. 08/28/14  Yes Thayer Headings, MD  pantoprazole (PROTONIX) 40 MG tablet Take 1 tablet (40 mg total) by mouth daily. 08/28/14  Yes Thayer Headings, MD  Patiromer Sorbitex Calcium  25.2 G PACK Take 25.2 mg by mouth 1 day or 1 dose.   Yes Historical Provider, MD  rosuvastatin (CRESTOR) 10 MG tablet Take 1 tablet (10 mg total) by mouth daily. 08/28/14  Yes Thayer Headings, MD  sodium bicarbonate 650 MG tablet Take 650 mg by mouth 4 (four) times daily.   Yes Historical Provider, MD   Allergies  Allergen Reactions  . Ciprofloxacin Hcl   . Doxycycline Hyclate     FAMILY HISTORY:  has no family status information on file.  SOCIAL HISTORY:  reports that he quit smoking about 8 years ago. He does not have any smokeless tobacco history on file. He reports that he does not drink alcohol.  REVIEW OF SYSTEMS:  Otherwise unremarkable  SUBJECTIVE:   VITAL SIGNS: Temp:  [97.6 F (36.4 C)-98.4 F (36.9 C)] 97.7 F (36.5 C) (10/12 1430) Pulse Rate:  [83-107] 96 (10/12 1500) Resp:  [10-31] 13 (10/12 1500) BP: (54-112)/(33-83) 112/63 mmHg (10/12 1500) SpO2:  [89 %-100 %] 99 % (10/12 1500) Weight:  [92.2 kg (203 lb 4.2 oz)] 92.2 kg (203 lb 4.2 oz) (10/12 0700) HEMODYNAMICS:   VENTILATOR SETTINGS:   INTAKE / OUTPUT:  Intake/Output Summary (Last 24 hours) at 04/07/15 1506 Last data filed at 04/07/15 1430  Gross per 24 hour  Intake 2366.67 ml  Output    450 ml  Net 1916.67 ml    PHYSICAL EXAMINATION: General: WDWN, NAD Neuro: No focal deficits HEENT: NCAT, WNL Cardiovascular: mildly tachy, reg, no M Lungs: clear anteriorly Abdomen: nondistended, mildy tender on L Ext: no edema  LABS:  CBC  Recent Labs  Lab 04/06/15 2158 04/07/15 0338 04/07/15 0439 04/07/15 1055  WBC 15.6* 16.4* 13.7*  --   HGB 6.6* 8.2* 7.5*  --   HCT 21.3* 25.4* 23.0*  --   PLT 190 180 177 148*   Coag's  Recent Labs Lab 04/05/15 0838 04/07/15 0439 04/07/15 1055  APTT  --   --  28  INR 1.04 1.28 1.34   BMET  Recent Labs Lab 04/05/15 0838 04/07/15 0439 04/07/15 1055  NA 141 144 143  K 5.3* 7.4* 7.4*  CL 114* 119* 119*  CO2 23 13* 11*  BUN 49* 59* 61*  CREATININE  2.79* 4.86* 5.11*  GLUCOSE 106* 161* 157*   Electrolytes  Recent Labs Lab 04/05/15 0838 04/07/15 0439 04/07/15 1055  CALCIUM 9.3 8.2* 8.0*  MG  --  1.6* 2.3  PHOS  --   --  5.6*   Sepsis Markers No results for input(s): LATICACIDVEN, PROCALCITON, O2SATVEN in the last 168 hours. ABG No results for input(s): PHART, PCO2ART, PO2ART in the last 168 hours. Liver Enzymes  Recent Labs Lab 04/05/15 0838 04/07/15 0439 04/07/15 1055  AST 20 223*  --   ALT 29 380*  --   ALKPHOS 129* 92  --   BILITOT 0.5 0.4  --   ALBUMIN 4.1 3.4* 3.4*   Cardiac Enzymes No results for input(s): TROPONINI, PROBNP in the last 168 hours. Glucose No results for input(s): GLUCAP in the last 168 hours.  CXR: no film    ASSESSMENT / PLAN:  PULMONARY A: No issues P:   Monitor SpO2  CARDIOVASCULAR A:  Hemorrhagic shock P:  Support with volume and blood products  RENAL A:   CKD - etiology unclear AKI due to shock P:   Monitor BMET intermittently Monitor I/Os Correct electrolytes as indicated CRRT per renal  GASTROINTESTINAL A:  Abd pain de to intra-abd hemorrhage Severe nausea P:   SUP: N/I PRN anti-emetics NPO for now  HEMATOLOGIC A:   Acute blood loss anemia Consumptive/dilutional coagulopathy P:  Transfuse PRBCs for shock and to maintain Hgb > 8.5 Transfuse FFP 2 units Keep PT INR < 1.4 Monitor cbc, coags  INFECTIOUS A:   No issues P:   Monitor temp, WBC count Micro and abx as above  ENDOCRINE A:   Mild stress induced hyperglycemia P:   Monitor glu on chem panels Consider SSI for glu > 180  NEUROLOGIC A:   Pain P:   PRN dilaudid   Merton Border, MD PCCM service Mobile [phone] Pager [phone]  04/07/2015, 3:06 PM

## 2015-04-07 NOTE — Progress Notes (Addendum)
Paged and spoke with Dr. Holley Raring about pt K 7.2. Calcium gluconate was ordered and administered.

## 2015-04-07 NOTE — Progress Notes (Signed)
MEDICATION RELATED CONSULT NOTE - INITIAL   Pharmacy Consult for CRRT dosing  Indication: CRRT dosing adjustments  Allergies  Allergen Reactions  . Ciprofloxacin Hcl   . Doxycycline Hyclate     Patient Measurements: Height: 5\' 11"  (180.3 cm) Weight: 198 lb (89.812 kg) IBW/kg (Calculated) : 75.3 Adjusted Body Weight:   Vital Signs: Temp: 98.2 F (36.8 C) (10/12 0510) Temp Source: Oral (10/12 0510) BP: 86/54 mmHg (10/12 0606) Pulse Rate: 91 (10/12 0606) Intake/Output from previous day: 10/11 0701 - 10/12 0700 In: 789.7 [P.O.:240; I.V.:229.7; Blood:320] Out: -  Intake/Output from this shift:    Labs:  Recent Labs  04/05/15 0838  04/06/15 2158 04/07/15 0338 04/07/15 0439  WBC 9.4  < > 15.6* 16.4* 13.7*  HGB 10.2*  < > 6.6* 8.2* 7.5*  HCT 32.0*  < > 21.3* 25.4* 23.0*  PLT 246  < > 190 180 177  CREATININE 2.79*  --   --   --   --   LABCREA 111  --   --   --   --   MG  --   --   --   --  1.6*  ALBUMIN 4.1  --   --   --   --   PROT 7.5  --   --   --   --   AST 20  --   --   --   --   ALT 29  --   --   --   --   ALKPHOS 129*  --   --   --   --   BILITOT 0.5  --   --   --   --   < > = values in this interval not displayed. Estimated Creatinine Clearance: 33.4 mL/min (by C-G formula based on Cr of 2.79).   Microbiology: No results found for this or any previous visit (from the past 720 hour(s)).  Medical History: Past Medical History  Diagnosis Date  . Allergy   . Heart murmur   . Hypertension     Medications:  Scheduled:  . ondansetron      . ondansetron (ZOFRAN) IV  8 mg Intravenous Once    Assessment: Patient has been transitioned to CRRT per nephrology  Goal of Therapy:  CRRT dosing adjustments  Plan:  Currently there are no medications needed dosing adjustments for CRRT.  Pharmacy to follow.   Coralyn Roselli D 04/07/2015,8:49 AM

## 2015-04-07 NOTE — Progress Notes (Signed)
[facility]  ROUNDING NOTE   Subjective:  Patient developed perinephric hematoma post left renal biopsy. There is some extracapsular bleeding now. Drop in hgb was noted yesterday, pt was transfused 1 unit PRBC. Hgb did come up to 8.2.  Hypotension also developed.  We transferred him to the CCU. Awaiting vascular surgery arrival to place temp HD catheter to proceed with CRRT given ARF and hyperkalemia. All this was explained to patient.    Objective:  Vital signs in last 24 hours:  Temp:  [97.7 F (36.5 C)-98.5 F (36.9 C)] 98.2 F (36.8 C) (10/12 0510) Pulse Rate:  [66-104] 91 (10/12 0606) Resp:  [13-20] 18 (10/12 0510) BP: (54-137)/(33-85) 86/54 mmHg (10/12 0606) SpO2:  [89 %-100 %] 99 % (10/12 0606) Weight:  [89.812 kg (198 lb)] 89.812 kg (198 lb) (10/11 0850)  Weight change:  Filed Weights   04/06/15 0850  Weight: 89.812 kg (198 lb)    Intake/Output: I/O last 3 completed shifts: In: 789.7 [P.O.:240; I.V.:229.7; Blood:320] Out: -    Intake/Output this shift:     Physical Exam: General: NAD  Head: Normocephalic, atraumatic. Moist oral mucosal membranes  Eyes: Anicteric, pallor noted  Neck: Supple, trachea midline  Lungs:  Clear to auscultation normal effort  Heart: Regular rate and rhythm no rubs  Abdomen:  Soft, mild left sided tenderness noted  Extremities:  no peripheral edema.  Neurologic: Nonfocal, moving all four extremities  Skin: No lesions  Access: None at the moment    Basic Metabolic Panel:  Recent Labs Lab 04/05/15 0838  NA 141  K 5.3*  CL 114*  CO2 23  GLUCOSE 106*  BUN 49*  CREATININE 2.79*  CALCIUM 9.3    Liver Function Tests:  Recent Labs Lab 04/05/15 0838  AST 20  ALT 29  ALKPHOS 129*  BILITOT 0.5  PROT 7.5  ALBUMIN 4.1   No results for input(s): LIPASE, AMYLASE in the last 168 hours. No results for input(s): AMMONIA in the last 168 hours.  CBC:  Recent Labs Lab 04/05/15 0838 04/06/15 1458  04/06/15 2158 04/07/15 0338  WBC 9.4 18.6* 15.6* 16.4*  NEUTROABS 5.6  --   --   --   HGB 10.2* 9.2* 6.6* 8.2*  HCT 32.0* 28.8* 21.3* 25.4*  MCV 86.6 86.4 88.0 86.5  PLT 246 244 190 180    Cardiac Enzymes: No results for input(s): CKTOTAL, CKMB, CKMBINDEX, TROPONINI in the last 168 hours.  BNP: Invalid input(s): POCBNP  CBG: No results for input(s): GLUCAP in the last 168 hours.  Microbiology: No results found for this or any previous visit.  Coagulation Studies:  Recent Labs  04/05/15 0838  LABPROT 13.8  INR 1.04    Urinalysis:  Recent Labs  04/05/15 0838  COLORURINE STRAW*  LABSPEC 1.011  PHURINE 5.0  GLUCOSEU NEGATIVE  HGBUR NEGATIVE  BILIRUBINUR NEGATIVE  KETONESUR NEGATIVE  PROTEINUR 100*  NITRITE NEGATIVE  LEUKOCYTESUR NEGATIVE      Imaging: Ct Abdomen Pelvis Wo Contrast  04/07/2015  CLINICAL DATA:  Decreased blood pressure. Flank pain. Left renal biopsy yesterday. EXAM: CT ABDOMEN AND PELVIS WITHOUT CONTRAST TECHNIQUE: Multidetector CT imaging of the abdomen and pelvis was performed following the standard protocol without IV contrast. COMPARISON:  04/06/2015 FINDINGS: New development of small left pleural effusion with atelectasis in the left lung base. Linear atelectasis in both lung bases. Probable focal calcification in the left pleural space. Near circumferential acute appearing subcapsular hematoma around the left kidney. Maximal depth measures about 2.5  cm. Hematoma appears slightly smaller than on previous study. There is hemorrhage and infiltration diffusely around the left perirenal fat. This is also similar to prior study. Moderately enlarged lymph nodes in the retroperitoneum and periaortic region. These are nonspecific and could be reactive or metastatic. There is progression of hematoma and infiltration into the upper aspect of the retroperitoneum and in the gastrohepatic ligament as well as around the crus of the diaphragm and posterior  subdiaphragmatic space since previous study. Small amount of new free fluid along the pericolic gutters and around the liver edge suggesting development of intraperitoneal hemorrhage. Small amount of fluid in the pelvis. The unenhanced appearance of the liver, spleen, gallbladder, pancreas, inferior vena cava are unremarkable. Calcification of the abdominal aorta without aneurysm. Exophytic cyst off the right kidney. No hydronephrosis in either kidney. Calcification of the abdominal aorta without aneurysm. Stomach, small bowel, and colon are not abnormally distended. Residual contrast material in the colon. No free air in the abdomen. Right adrenal gland is not enlarged. Left adrenal gland is obscured by the hemorrhage. Pelvis: Prostate gland appears mildly enlarged. Bladder wall is not thickened. No loculated pelvic fluid collections. No pelvic mass or lymphadenopathy. Appendix is not identified. No evidence of diverticulitis. With no destructive bone lesions. IMPRESSION: Moderately large subcapsular hematoma around the left kidney appears about the same as yesterday. Hemorrhagic stranding in the left perirenal fat is about the same. There is increasing hemorrhage in the retroperitoneum, celiac axis, gastrohepatic ligament, posterior subdiaphragmatic space, and with development of free fluid, likely intraperitoneal hemorrhage around the liver and extending into the pelvis. Enlarged retroperitoneal lymph nodes similar to previous study. New left pleural effusion with basilar atelectasis. These results were called by telephone at the time of interpretation on 04/07/2015 at 6:24 am to Dr. Anthonette Legato , who verbally acknowledged these results. Electronically Signed   By: Lucienne Capers M.D.   On: 04/07/2015 06:31   Ct Abdomen Pelvis Wo Contrast  04/06/2015  CLINICAL DATA:  Left sided severe pain after percutaneous renal biopsy today EXAM: CT ABDOMEN AND PELVIS WITHOUT CONTRAST TECHNIQUE: Multidetector CT  imaging of the abdomen and pelvis was performed following the standard protocol without IV contrast. COMPARISON:  Ultrasound from earlier the same day FINDINGS: There is a moderately large mixed attenuation left subcapsular hematoma around the kidney laterally from upper to lower pole, measuring approximately 3.2 cm maximum thickness, resulting in mild compression of the left renal parenchyma. There are streaky left perinephric retroperitoneal opacities, extending primarily medially and superiorly toward the adrenal gland. Visualized lung bases clear. Unremarkable liver, gallbladder, spleen, pancreas, adrenal glands. Unenhanced CT was performed per clinician order. Lack of IV contrast limits sensitivity and specificity, especially for evaluation of abdominal/pelvic solid viscera. There is a fluid attenuation exophytic 3.9 lesion from the upper pole right kidney presumably renal cyst but incompletely characterized. No hydronephrosis. Stomach, small bowel, and colon are nondilated. Urinary bladder physiologically distended. Mild prostatic enlargement. Bilateral pelvic phleboliths. Scattered aortoiliac arterial plaque without aneurysm. Increased number of prominent para-aortic and aortocaval lymph nodes, none greater than 1 cm short axis diameter. No ascites. No free air. Minimal spondylitic changes in the lumbar spine. Prominent protrusion L1-2. IMPRESSION: 1. Moderately large left subcapsular hematoma with mild compression of the left renal parenchyma. 2. Para-aortic and aortocaval retroperitoneal adenopathy, possibly reactive but nonspecific ; lymphoproliferative disease could have similar appearance. Critical Value/emergent results were called by telephone at the time of interpretation on 04/06/2015 at 3:11 pm to Dr. Inocente Salles Achaia Garlock ,  who verbally acknowledged these results. Electronically Signed   By: Lucrezia Europe M.D.   On: 04/06/2015 15:13   US Biopsy-no Radiologist  04/06/2015  CLINICAL DATA:  Ultrasound was  utilized for biopsy by the requesting physician.     Medications:   . sodium chloride 40 mL/hr at 04/06/15 1455  . pureflow     . calcium gluconate  1 g Intravenous Once   acetaminophen, heparin, HYDROmorphone (DILAUDID) injection, ondansetron (ZOFRAN) IV, oxyCODONE-acetaminophen  Assessment/ Plan:  49 y.o. male with past medical history of hypertension, hyperlipidemia, anemia chronic kidney disease, peptic ulcer disease, CKD stage IV, anemia of CKD proteinuria, hyperkalemia s/p renal bioy 04/06/15 with subsequent left perinephric hematoma.  1.  Acute renal failure/CKD stage IV/Proteinuria:  Pt s/p left renal biopsy. Perinephric hematoma developed which was initially subcapsular, now with extracapsular bleeding.   -K has risen quite high, renal function also worse, will proceed with CRRT, risks/benefits/alternatives discussed, pt wishes to proceed at this time.    2.  Left perinephric hematoma:  Noted post biopsy, was initially subcapsular, now extracapsular.  Discussed in depth with pt and Dr. Lucky Cowboy.  May need to embolize kidney if bleeding doesn't stop.  Will follow hgb q6 hours for now.    3.  Post hemorrhagic anemia:  Hgb was as low as 6.6, was given one unit prbc transfusion, will check another CBC now and every 6 hours.  If hgb continues to drift down will transfuse.    4.  Hyperkalemia:  Will dialzye against at 2k bath on CRRT.    5.  Will follow very closely today, will also consult pulm/cc.  Dr. Lucky Cowboy already on case.      LOS: 0 Annely Sliva 10/12/20167:01 AM

## 2015-04-07 NOTE — Op Note (Signed)
  OPERATIVE NOTE   PROCEDURE: 1. Ultrasound guidance for vascular access right femoral vein 2. Placement of a 30 cm Trialysis type dialysis catheter right femoral vein  PRE-OPERATIVE DIAGNOSIS: 1. Acute renal failure 2. Hemorrhage from renal biopsy  POST-OPERATIVE DIAGNOSIS: Same  SURGEON: Leotis Pain, MD  ASSISTANT(S): None  ANESTHESIA: local  ESTIMATED BLOOD LOSS: Minimal   FINDING(S): 1. None  SPECIMEN(S): None  INDICATIONS:  Patient is a 49 yo male who presents with hemorrhage from a renal biopsy and now has hyperkalemia and ARF and needs dialysis.  Risks and benefits were discussed, and informed consent was obtained..  DESCRIPTION: After obtaining full informed written consent, the patient was laid flat in the bed. The right groin was sterilely prepped and draped in a sterile surgical field was created. The right femoral vein was visualized with ultrasound and found to be widely patent. It was then accessed under direct guidance without difficulty with a Seldinger needle and a permanent image was recorded. A J-wire was then placed. After skin nick and dilatation, a 30 cm Trialysis dialysis catheter was placed over the wire and the wire was removed. The lumens withdrew dark red nonpulsatile blood and flushed easily with sterile saline. The catheter was secured to the skin with 3 nylon sutures. Sterile dressing was placed.  COMPLICATIONS: None  CONDITION: Stable  Whitney Hillegass 04/07/2015 8:15 AM

## 2015-04-07 NOTE — Progress Notes (Addendum)
Paged and spopke with Dr. Holley Raring about pt BP 54/34 and Hemoglobin of 6.6. 1 unit pRBC ordered and administered.

## 2015-04-07 NOTE — H&P (Signed)
[facility] Consult Note  MRN : [id number]  Joseph [name] is a 49 y.o. (1965/10/18) male who presents with chief complaint of No chief complaint on file. Marland Kitchen  History of Present Illness: Patient is admitted to the hospital with renal biopsy and has developed acute renal failure.  The patient reports pain after his biopsy, and has developed hemorrhage after the procedure.  He has gotten transfusions and has developed hypotension and hyperkalemia.  The patient is critically ill with the hemorrhage, and now needs dialysis. The nephrology service has decided to initiate dialysis at this time, and we are asked to place a temporary dialysis catheter for immediate dialysis use.    Current Facility-Administered Medications  Medication Dose Route Frequency Provider Last Rate Last Dose  . 0.9 %  sodium chloride infusion   Intravenous Continuous Munsoor Lateef, MD 40 mL/hr at 04/06/15 1455    . acetaminophen (TYLENOL) tablet 500-1,000 mg  500-1,000 mg Oral Q6H PRN Munsoor Lateef, MD      . heparin injection 1,000-6,000 Units  1,000-6,000 Units CRRT PRN Munsoor Lateef, MD      . HYDROmorphone (DILAUDID) injection 1 mg  1 mg Intravenous Q2H PRN Lytle Butte, MD      . ondansetron [facility]) injection 4 mg  4 mg Intravenous Q8H PRN Munsoor Lateef, MD   4 mg at 04/07/15 0448  . oxyCODONE-acetaminophen (PERCOCET/ROXICET) 5-325 MG per tablet 1-2 tablet  1-2 tablet Oral Q4H PRN Munsoor Lateef, MD      . pureflow IV solution for Dialysis   CRRT Continuous Munsoor Lateef, MD        Past Medical History  Diagnosis Date  . Allergy   . Heart murmur   . Hypertension     Past Surgical History  Procedure Laterality Date  . Renal biopsy, percutaneous  04/06/2015         Social History Social History  Substance Use Topics  . Smoking status: Former Smoker    Quit date: 11/04/2006  . Smokeless tobacco: None  . Alcohol Use: No  No IVDU  Family History Family History   Problem Relation Age of Onset  . Hypertension Other   father with renal failure No bleeding or clotting disorders, no aneurysms  Allergies  Allergen Reactions  . Ciprofloxacin Hcl   . Doxycycline Hyclate      REVIEW OF SYSTEMS (Negative unless checked)  Constitutional: [] Weight loss  [] Fever  [] Chills Cardiac: [] Chest pain   [] Chest pressure   [] Palpitations   [] Shortness of breath when laying flat   [] Shortness of breath at rest   [] Shortness of breath with exertion. Vascular:  [] Pain in legs with walking   [] Pain in legs at rest   [] Pain in legs when laying flat   [] Claudication   [] Pain in feet when walking  [] Pain in feet at rest  [] Pain in feet when laying flat   [] History of DVT   [] Phlebitis   [] Swelling in legs   [] Varicose veins   [] Non-healing ulcers Pulmonary:   [] Uses home oxygen   [] Productive cough   [] Hemoptysis   [] Wheeze  [] COPD   [] Asthma Neurologic:  [] Dizziness  [] Blackouts   [] Seizures   [] History of stroke   [] History of TIA  [] Aphasia   [] Temporary blindness   [] Dysphagia   [] Weakness or numbness in arms   [] Weakness or numbness in legs Musculoskeletal:  [] Arthritis   [] Joint swelling   [] Joint pain   [] Low back pain Hematologic:  [] Easy bruising  []   Easy bleeding   [] Hypercoagulable state   [x] Anemic  [] Hepatitis Gastrointestinal:  [] Blood in stool   [] Vomiting blood  [] Gastroesophageal reflux/heartburn   [] Difficulty swallowing. Genitourinary:  [x] Chronic kidney disease   [] Difficult urination  [] Frequent urination  [] Burning with urination   [] Blood in urine Skin:  [] Rashes   [] Ulcers   [] Wounds Psychological:  [] History of anxiety   []  History of major depression.       Physical Examination  Filed Vitals:   04/07/15 0323 04/07/15 0510 04/07/15 0525 04/07/15 0606  BP: 63/44 72/48 84/58  86/54  Pulse: 104 84 87 91  Temp:  98.2 F (36.8 C)    TempSrc:  Oral    Resp:  18    Height:      Weight:      SpO2: 96% 97%  99%   Body mass index is 27.63  kg/(m^2). Gen: WD/WN Head: [place]/AT, No temporalis wasting Ear/Nose/Throat: Hearing grossly intact, nares w/o erythema or drainage,  Eyes: PERRLA, EOMI.  Neck: Supple, no nuchal rigidity.  No bruit or JVD.  Pulmonary:  Good air movement, clear to auscultation bilaterally.  Cardiac: RRR, normal S1, S2, no Murmurs, rubs or gallops.  Gastrointestinal: soft, non-tender/non-distended. No guarding/reflex.  Musculoskeletal: M/S 5/5 throughout.  Extremities without ischemic changes.  No deformity or atrophy. Mild Edema in the lower extremities bilaterally Neurologic: awake, alert, follows commands and moves all four without obvious deficits Psychiatric: normal affect and mood Dermatologic: No rashes or ulcers noted.   Lymph : No Cervical, Axillary, or Inguinal lymphadenopathy.       CBC Lab Results  Component Value Date   WBC 13.7* 04/07/2015   HGB 7.5* 04/07/2015   HCT 23.0* 04/07/2015   MCV 87.6 04/07/2015   PLT 177 04/07/2015    BMET    Component Value Date/Time   NA 141 04/05/2015 0838   NA 141 04/06/2014 0754   K 5.3* 04/05/2015 0838   K 5.2* 04/06/2014 0754   CL 114* 04/05/2015 0838   CL 111* 04/06/2014 0754   CO2 23 04/05/2015 0838   CO2 24 04/06/2014 0754   GLUCOSE 106* 04/05/2015 0838   GLUCOSE 104* 04/06/2014 0754   BUN 49* 04/05/2015 0838   BUN 39* 04/06/2014 0754   CREATININE 2.79* 04/05/2015 0838   CREATININE 2.76* 04/06/2014 0754   CALCIUM 9.3 04/05/2015 0838   CALCIUM 8.4* 04/06/2014 0754   GFRNONAA 25* 04/05/2015 0838   GFRNONAA 26* 04/06/2014 0754   GFRAA 29* 04/05/2015 0838   GFRAA 32* 04/06/2014 0754   Estimated Creatinine Clearance: 33.4 mL/min (by C-G formula based on Cr of 2.79).  COAG Lab Results  Component Value Date   INR 1.04 04/05/2015    Radiology Ct Abdomen Pelvis Wo Contrast  04/07/2015  CLINICAL DATA:  Decreased blood pressure. Flank pain. Left renal biopsy yesterday. EXAM: CT ABDOMEN AND PELVIS WITHOUT CONTRAST TECHNIQUE:  Multidetector CT imaging of the abdomen and pelvis was performed following the standard protocol without IV contrast. COMPARISON:  04/06/2015 FINDINGS: New development of small left pleural effusion with atelectasis in the left lung base. Linear atelectasis in both lung bases. Probable focal calcification in the left pleural space. Near circumferential acute appearing subcapsular hematoma around the left kidney. Maximal depth measures about 2.5 cm. Hematoma appears slightly smaller than on previous study. There is hemorrhage and infiltration diffusely around the left perirenal fat. This is also similar to prior study. Moderately enlarged lymph nodes in the retroperitoneum and periaortic region. These are nonspecific and could be reactive  or metastatic. There is progression of hematoma and infiltration into the upper aspect of the retroperitoneum and in the gastrohepatic ligament as well as around the crus of the diaphragm and posterior subdiaphragmatic space since previous study. Small amount of new free fluid along the pericolic gutters and around the liver edge suggesting development of intraperitoneal hemorrhage. Small amount of fluid in the pelvis. The unenhanced appearance of the liver, spleen, gallbladder, pancreas, inferior vena cava are unremarkable. Calcification of the abdominal aorta without aneurysm. Exophytic cyst off the right kidney. No hydronephrosis in either kidney. Calcification of the abdominal aorta without aneurysm. Stomach, small bowel, and colon are not abnormally distended. Residual contrast material in the colon. No free air in the abdomen. Right adrenal gland is not enlarged. Left adrenal gland is obscured by the hemorrhage. Pelvis: Prostate gland appears mildly enlarged. Bladder wall is not thickened. No loculated pelvic fluid collections. No pelvic mass or lymphadenopathy. Appendix is not identified. No evidence of diverticulitis. With no destructive bone lesions. IMPRESSION: Moderately  large subcapsular hematoma around the left kidney appears about the same as yesterday. Hemorrhagic stranding in the left perirenal fat is about the same. There is increasing hemorrhage in the retroperitoneum, celiac axis, gastrohepatic ligament, posterior subdiaphragmatic space, and with development of free fluid, likely intraperitoneal hemorrhage around the liver and extending into the pelvis. Enlarged retroperitoneal lymph nodes similar to previous study. New left pleural effusion with basilar atelectasis. These results were called by telephone at the time of interpretation on 04/07/2015 at 6:24 am to Dr. Anthonette Legato , who verbally acknowledged these results. Electronically Signed   By: Lucienne Capers M.D.   On: 04/07/2015 06:31   Ct Abdomen Pelvis Wo Contrast  04/06/2015  CLINICAL DATA:  Left sided severe pain after percutaneous renal biopsy today EXAM: CT ABDOMEN AND PELVIS WITHOUT CONTRAST TECHNIQUE: Multidetector CT imaging of the abdomen and pelvis was performed following the standard protocol without IV contrast. COMPARISON:  Ultrasound from earlier the same day FINDINGS: There is a moderately large mixed attenuation left subcapsular hematoma around the kidney laterally from upper to lower pole, measuring approximately 3.2 cm maximum thickness, resulting in mild compression of the left renal parenchyma. There are streaky left perinephric retroperitoneal opacities, extending primarily medially and superiorly toward the adrenal gland. Visualized lung bases clear. Unremarkable liver, gallbladder, spleen, pancreas, adrenal glands. Unenhanced CT was performed per clinician order. Lack of IV contrast limits sensitivity and specificity, especially for evaluation of abdominal/pelvic solid viscera. There is a fluid attenuation exophytic 3.9 lesion from the upper pole right kidney presumably renal cyst but incompletely characterized. No hydronephrosis. Stomach, small bowel, and colon are nondilated. Urinary  bladder physiologically distended. Mild prostatic enlargement. Bilateral pelvic phleboliths. Scattered aortoiliac arterial plaque without aneurysm. Increased number of prominent para-aortic and aortocaval lymph nodes, none greater than 1 cm short axis diameter. No ascites. No free air. Minimal spondylitic changes in the lumbar spine. Prominent protrusion L1-2. IMPRESSION: 1. Moderately large left subcapsular hematoma with mild compression of the left renal parenchyma. 2. Para-aortic and aortocaval retroperitoneal adenopathy, possibly reactive but nonspecific ; lymphoproliferative disease could have similar appearance. Critical Value/emergent results were called by telephone at the time of interpretation on 04/06/2015 at 3:11 pm to Dr. Inocente Salles LATEEF , who verbally acknowledged these results. Electronically Signed   By: Lucrezia Europe M.D.   On: 04/06/2015 15:13   US Biopsy-no Radiologist  04/06/2015  CLINICAL DATA:  Ultrasound was utilized for biopsy by the requesting physician.  Assessment/Plan 1. ARF 2. Hemorrhage from renal biopsy.  There is blood in retroperitoneum, and he is likely still bleeding somewhat.  His clinical condition has stabilized, and have discussed with Nephrology service the plan.  Will monitor hemodynamics closely and follow CBC.  If bleeding continues, may require embolization to stop.  This would result in loss of renal function permanently in that kidney, so will try to avoid that if possible 3. Acute blood loss anemia.  From number two.  Transfuse as needed.   We will proceed with temporary dialysis catheter placement at this time.  Risks and benefits discussed with patient and/or family, and the catheter will be placed to allow immediate initiation of dialysis.  If the patient's renal function does not improve throughout the hospital course, we will be happy to place a tunneled dialysis catheter for long term use prior to discharge.     Clennon Nasca, MD  04/07/2015 8:03  AM

## 2015-04-07 NOTE — Progress Notes (Signed)
K still noted to be high.  Will switch to 0K bath now.

## 2015-04-07 NOTE — Progress Notes (Addendum)
Initial Nutrition Assessment   INTERVENTION:   Meals and Snacks: Cater to patient preferences Medical Food Supplement Therapy: pt may benefit from Nepro on follow if intake poor   NUTRITION DIAGNOSIS:   Increased nutrient needs related to acute illness as evidenced by estimated needs.  GOAL:   Patient will meet greater than or equal to 90% of their needs  MONITOR:    (Energy Intake, Electrolyte and Renal Profile, Anthropometrics, UOP, Digestive system)  REASON FOR ASSESSMENT:   Malnutrition Screening Tool    ASSESSMENT:   Pt admitted yesterday for renal biopsy, developed hemorrhage with blood in retroperitoneum after procedure. Pt transferred to ICU with acute renal failure and hypotensive, now requiring tempcath placement and intitiation of CRRT per MD note.  Past Medical History  Diagnosis Date  . Allergy   . Heart murmur   . Hypertension      Diet Order:  Diet regular Room service appropriate?: Yes; Fluid consistency:: Thin    Current Nutrition: Unable to clarify with pt this am on visit, no family present. RD notes pt ate 100% of lunch yesterday per documentation.  Food/Nutrition-Related History: Per MST pt with decreased appetite PTA.   Medications: NS at 64m.hr, Zofran, dilaudid, MgS, calcium gluconate  Electrolyte/Renal Profile and Glucose Profile:   Recent Labs Lab 04/05/15 0838 04/07/15 0439 04/07/15 1055  NA 141 144 143  K 5.3* 7.4* 7.4*  CL 114* 119* 119*  CO2 23 13* 11*  BUN 49* 59* 61*  CREATININE 2.79* 4.86* 5.11*  CALCIUM 9.3 8.2* 8.0*  MG  --  1.6* 2.3  PHOS  --   --  5.6*  GLUCOSE 106* 161* 157*   Protein Profile:  Recent Labs Lab 04/05/15 0838 04/07/15 0439 04/07/15 1055  ALBUMIN 4.1 3.4* 3.4*   Nutritional Anemia Profile:  CBC Latest Ref Rng 04/07/2015 04/07/2015 04/07/2015  WBC 3.8 - 10.6 K/uL - 13.7(H) 16.4(H)  Hemoglobin 13.0 - 18.0 g/dL - 7.5(L) 8.2(L)  Hematocrit 40.0 - 52.0 % - 23.0(L) 25.4(L)  Platelets 150 -  440 K/uL 148(L) 177 180   Hepatic Profile: Hepatic Function Latest Ref Rng 04/07/2015 04/07/2015 04/05/2015  Total Protein 6.5 - 8.1 g/dL - 6.2(L) 7.5  Albumin 3.5 - 5.0 g/dL 3.4(L) 3.4(L) 4.1  AST 15 - 41 U/L - 223(H) 20  ALT 17 - 63 U/L - 380(H) 29  Alk Phosphatase 38 - 126 U/L - 92 129(H)  Total Bilirubin 0.3 - 1.2 mg/dL - 0.4 0.5    Gastrointestinal Profile: Last BM:  04/04/2015   Nutrition-Focused Physical Exam Findings:  Unable to complete Nutrition-Focused physical exam at this time.     Weight Change: Per MST pt with weight loss PTA. No further weights in [facility]. RD notes weight recorded yesterday of 198lbs.   Skin:  Reviewed, no issues   Height:   Ht Readings from Last 1 Encounters:  04/06/15 5' 11"  (1.803 m)    Weight:   Wt Readings from Last 1 Encounters:  04/07/15 203 lb 4.2 oz (92.2 kg)    BMI:  Body mass index is 28.36 kg/(m^2).   Estimated Nutritional Needs:   Kcal:  BEE: 1610kcals, TEE: (IF 1.1-1.3)(AF 1.2) 2125-2511kcals  Protein:  107-135g protein (1.2-1.5g/kg)  Fluid:  UOP+1L   EDUCATION NEEDS:   Education needs no appropriate at this time   HColman RD, LDN Pager [phone]

## 2015-04-07 NOTE — Progress Notes (Signed)
Mary nurse called to inform me of the patient's systolic blood pressure which is still 60 following a liter bolus earlier in the evening. The patient's pressure had decreased following multiple doses of Dilaudid for flank pain. He is currently invading well and does not feel faint. Heart rate remains between 100 - 110. Instructed nurse to give another liter bolus and place patient in Trendelenburg. Discussed case with Dr. Holley Raring who was ordered a stat CT scan to check for retroperitoneal bleeding. Pressure does not respond to fluid resuscitation or if bleeding found we'll moved to ICU and initiate pressors.

## 2015-04-08 LAB — CBC
HCT: 23.4 % — ABNORMAL LOW (ref 40.0–52.0)
HEMATOCRIT: 21.6 % — AB (ref 40.0–52.0)
HEMATOCRIT: 22.2 % — AB (ref 40.0–52.0)
HEMATOCRIT: 23.3 % — AB (ref 40.0–52.0)
HEMATOCRIT: 23.5 % — AB (ref 40.0–52.0)
HEMOGLOBIN: 7 g/dL — AB (ref 13.0–18.0)
HEMOGLOBIN: 7.7 g/dL — AB (ref 13.0–18.0)
HEMOGLOBIN: 7.6 g/dL — AB (ref 13.0–18.0)
Hemoglobin: 7.8 g/dL — ABNORMAL LOW (ref 13.0–18.0)
Hemoglobin: 7.1 g/dL — ABNORMAL LOW (ref 13.0–18.0)
MCH: 28.2 pg (ref 26.0–34.0)
MCH: 27.8 pg (ref 26.0–34.0)
MCH: 27.6 pg (ref 26.0–34.0)
MCH: 28.2 pg (ref 26.0–34.0)
MCH: 27.7 pg (ref 26.0–34.0)
MCHC: 32 g/dL (ref 32.0–36.0)
MCHC: 32.5 g/dL (ref 32.0–36.0)
MCHC: 32.6 g/dL (ref 32.0–36.0)
MCHC: 33 g/dL (ref 32.0–36.0)
MCHC: 33.1 g/dL (ref 32.0–36.0)
MCV: 85.1 fL (ref 80.0–100.0)
MCV: 85.3 fL (ref 80.0–100.0)
MCV: 85.3 fL (ref 80.0–100.0)
MCV: 85.5 fL (ref 80.0–100.0)
MCV: 86 fL (ref 80.0–100.0)
PLATELETS: 73 10*3/uL — AB (ref 150–440)
PLATELETS: 64 10*3/uL — AB (ref 150–440)
Platelets: 89 10*3/uL — ABNORMAL LOW (ref 150–440)
Platelets: 78 10*3/uL — ABNORMAL LOW (ref 150–440)
Platelets: 65 10*3/uL — ABNORMAL LOW (ref 150–440)
RBC: 2.52 MIL/uL — AB (ref 4.40–5.90)
RBC: 2.58 MIL/uL — AB (ref 4.40–5.90)
RBC: 2.74 MIL/uL — ABNORMAL LOW (ref 4.40–5.90)
RBC: 2.75 MIL/uL — AB (ref 4.40–5.90)
RBC: 2.76 MIL/uL — ABNORMAL LOW (ref 4.40–5.90)
RDW: 14.4 % (ref 11.5–14.5)
RDW: 14.6 % — AB (ref 11.5–14.5)
RDW: 14.8 % — AB (ref 11.5–14.5)
RDW: 14.8 % — AB (ref 11.5–14.5)
RDW: 15.2 % — ABNORMAL HIGH (ref 11.5–14.5)
WBC: 17.3 10*3/uL — AB (ref 3.8–10.6)
WBC: 17.5 10*3/uL — AB (ref 3.8–10.6)
WBC: 18.1 10*3/uL — AB (ref 3.8–10.6)
WBC: 18.4 10*3/uL — AB (ref 3.8–10.6)
WBC: 18.4 10*3/uL — ABNORMAL HIGH (ref 3.8–10.6)

## 2015-04-08 LAB — RENAL FUNCTION PANEL
ALBUMIN: 3 g/dL — AB (ref 3.5–5.0)
ALBUMIN: 2.8 g/dL — AB (ref 3.5–5.0)
ALBUMIN: 3 g/dL — AB (ref 3.5–5.0)
ALBUMIN: 3 g/dL — AB (ref 3.5–5.0)
ANION GAP: 6 (ref 5–15)
ANION GAP: 7 (ref 5–15)
ANION GAP: 6 (ref 5–15)
Albumin: 3.1 g/dL — ABNORMAL LOW (ref 3.5–5.0)
Anion gap: 4 — ABNORMAL LOW (ref 5–15)
Anion gap: 5 (ref 5–15)
BUN: 29 mg/dL — AB (ref 6–20)
BUN: 30 mg/dL — AB (ref 6–20)
BUN: 33 mg/dL — ABNORMAL HIGH (ref 6–20)
BUN: 36 mg/dL — AB (ref 6–20)
BUN: 44 mg/dL — ABNORMAL HIGH (ref 6–20)
CALCIUM: 8 mg/dL — AB (ref 8.9–10.3)
CALCIUM: 8.2 mg/dL — AB (ref 8.9–10.3)
CALCIUM: 8.4 mg/dL — AB (ref 8.9–10.3)
CHLORIDE: 107 mmol/L (ref 101–111)
CHLORIDE: 102 mmol/L (ref 101–111)
CO2: 27 mmol/L (ref 22–32)
CO2: 29 mmol/L (ref 22–32)
CO2: 30 mmol/L (ref 22–32)
CO2: 31 mmol/L (ref 22–32)
CO2: 31 mmol/L (ref 22–32)
CREATININE: 2.87 mg/dL — AB (ref 0.61–1.24)
CREATININE: 2.91 mg/dL — AB (ref 0.61–1.24)
Calcium: 7.9 mg/dL — ABNORMAL LOW (ref 8.9–10.3)
Calcium: 8.2 mg/dL — ABNORMAL LOW (ref 8.9–10.3)
Chloride: 103 mmol/L (ref 101–111)
Chloride: 105 mmol/L (ref 101–111)
Chloride: 105 mmol/L (ref 101–111)
Creatinine, Ser: 3.88 mg/dL — ABNORMAL HIGH (ref 0.61–1.24)
Creatinine, Ser: 3.28 mg/dL — ABNORMAL HIGH (ref 0.61–1.24)
Creatinine, Ser: 3.07 mg/dL — ABNORMAL HIGH (ref 0.61–1.24)
GFR calc Af Amer: 19 mL/min — ABNORMAL LOW (ref >60)
GFR calc Af Amer: 25 mL/min — ABNORMAL LOW (ref >60)
GFR calc Af Amer: 27 mL/min — ABNORMAL LOW (ref >60)
GFR calc non Af Amer: 17 mL/min — ABNORMAL LOW (ref >60)
GFR calc non Af Amer: 23 mL/min — ABNORMAL LOW (ref >60)
GFR, EST AFRICAN AMERICAN: 24 mL/min — AB (ref >60)
GFR, EST AFRICAN AMERICAN: 28 mL/min — AB (ref >60)
GFR, EST NON AFRICAN AMERICAN: 20 mL/min — AB (ref >60)
GFR, EST NON AFRICAN AMERICAN: 22 mL/min — AB (ref >60)
GFR, EST NON AFRICAN AMERICAN: 24 mL/min — AB (ref >60)
GLUCOSE: 119 mg/dL — AB (ref 65–99)
GLUCOSE: 119 mg/dL — AB (ref 65–99)
GLUCOSE: 133 mg/dL — AB (ref 65–99)
Glucose, Bld: 116 mg/dL — ABNORMAL HIGH (ref 65–99)
Glucose, Bld: 125 mg/dL — ABNORMAL HIGH (ref 65–99)
PHOSPHORUS: 3.9 mg/dL (ref 2.5–4.6)
PHOSPHORUS: 3.5 mg/dL (ref 2.5–4.6)
PHOSPHORUS: 3.2 mg/dL (ref 2.5–4.6)
POTASSIUM: 4 mmol/L (ref 3.5–5.1)
POTASSIUM: 4.6 mmol/L (ref 3.5–5.1)
Phosphorus: 4.2 mg/dL (ref 2.5–4.6)
Phosphorus: 2.6 mg/dL (ref 2.5–4.6)
Potassium: 4.5 mmol/L (ref 3.5–5.1)
Potassium: 4.9 mmol/L (ref 3.5–5.1)
Potassium: 4.6 mmol/L (ref 3.5–5.1)
SODIUM: 140 mmol/L (ref 135–145)
SODIUM: 140 mmol/L (ref 135–145)
Sodium: 138 mmol/L (ref 135–145)
Sodium: 140 mmol/L (ref 135–145)
Sodium: 140 mmol/L (ref 135–145)

## 2015-04-08 LAB — PREPARE FRESH FROZEN PLASMA
UNIT DIVISION: 0
Unit division: 0

## 2015-04-08 LAB — PREPARE RBC (CROSSMATCH)

## 2015-04-08 LAB — GLUCOSE, CAPILLARY
Glucose-Capillary: 117 mg/dL — ABNORMAL HIGH (ref 65–99)
Glucose-Capillary: 135 mg/dL — ABNORMAL HIGH (ref 65–99)
Glucose-Capillary: 130 mg/dL — ABNORMAL HIGH (ref 65–99)
Glucose-Capillary: 125 mg/dL — ABNORMAL HIGH (ref 65–99)

## 2015-04-08 LAB — MAGNESIUM
MAGNESIUM: 1.7 mg/dL (ref 1.7–2.4)
MAGNESIUM: 1.7 mg/dL (ref 1.7–2.4)
Magnesium: 1.6 mg/dL — ABNORMAL LOW (ref 1.7–2.4)
Magnesium: 1.6 mg/dL — ABNORMAL LOW (ref 1.7–2.4)

## 2015-04-08 LAB — APTT: aPTT: 37 seconds — ABNORMAL HIGH (ref 24–36)

## 2015-04-08 MED ORDER — PANTOPRAZOLE SODIUM 40 MG IV SOLR
40.0000 mg | INTRAVENOUS | Status: DC
  Administered 2015-04-08: 40 mg via INTRAVENOUS
  Filled 2015-04-08: qty 40

## 2015-04-08 MED ORDER — ONDANSETRON HCL 4 MG/2ML IJ SOLN
INTRAMUSCULAR | Status: AC
  Administered 2015-04-08: 4 mg via INTRAVENOUS
  Filled 2015-04-08: qty 2

## 2015-04-08 MED ORDER — PUREFLOW DIALYSIS SOLUTION
INTRAVENOUS | Status: DC
  Administered 2015-04-08 (×2): 15000 mL via INTRAVENOUS_CENTRAL

## 2015-04-08 MED ORDER — SODIUM CHLORIDE 0.9 % IV SOLN
Freq: Once | INTRAVENOUS | Status: AC
  Administered 2015-04-08: 12:00:00 via INTRAVENOUS

## 2015-04-08 MED ORDER — ONDANSETRON HCL 4 MG/2ML IJ SOLN
4.0000 mg | Freq: Once | INTRAMUSCULAR | Status: AC
  Administered 2015-04-08: 4 mg via INTRAVENOUS

## 2015-04-08 NOTE — Progress Notes (Signed)
[facility]  Daily Progress Note   Subjective  - * No surgery found *   Patient feeling better. Less pain. Hemoglobin seems to have stabilized. On CRRT. No other major events overnight  Objective Filed Vitals:   04/08/15 1400 04/08/15 1415 04/08/15 1500 04/08/15 1600  BP: 121/76 128/80 133/80 120/78  Pulse: 91 94 96 95  Temp:  98.5 F (36.9 C)  98.4 F (36.9 C)  TempSrc:  Oral  Oral  Resp: 17 27 24 13   Height:      Weight:      SpO2: 95% 95% 90% 90%    Intake/Output Summary (Last 24 hours) at 04/08/15 1723 Last data filed at 04/08/15 1230  Gross per 24 hour  Intake 2131.67 ml  Output    400 ml  Net 1731.67 ml    PULM  CTAB CV  RRR   Laboratory CBC    Component Value Date/Time   WBC 18.4* 04/08/2015 0952   WBC 8.1 04/06/2014 0754   HGB 7.0* 04/08/2015 0952   HGB 9.9* 04/06/2014 0754   HCT 21.6* 04/08/2015 0952   HCT 30.7* 04/06/2014 0754   PLT 78* 04/08/2015 0952   PLT 273 04/06/2014 0754    BMET    Component Value Date/Time   NA 140 04/08/2015 0952   NA 141 04/06/2014 0754   K 4.6 04/08/2015 0952   K 5.2* 04/06/2014 0754   CL 103 04/08/2015 0952   CL 111* 04/06/2014 0754   CO2 31 04/08/2015 0952   CO2 24 04/06/2014 0754   GLUCOSE 133* 04/08/2015 0952   GLUCOSE 104* 04/06/2014 0754   BUN 33* 04/08/2015 0952   BUN 39* 04/06/2014 0754   CREATININE 3.07* 04/08/2015 0952   CREATININE 2.76* 04/06/2014 0754   CALCIUM 8.2* 04/08/2015 0952   CALCIUM 8.4* 04/06/2014 0754   GFRNONAA 22* 04/08/2015 0952   GFRNONAA 26* 04/06/2014 0754   GFRAA 25* 04/08/2015 0952   GFRAA 32* 04/06/2014 0754    Assessment/Planning: Renal failure. Doing better with CRRT. K down. Acidosis better   Hemorrhage from kidney biopsy. Bleeding seems to have slowed or stopped. Platelets are down, and if they do not improve I would consider transfusion if his hemoglobin continues to drift.  No role for embolization at  current.    Ethie Curless  04/08/2015, 5:23 PM

## 2015-04-08 NOTE — Progress Notes (Signed)
Pt rested overnight, CRRT running. Labs improving, potassium normal, BUN and Cr coming down. Urine output marginal. BP stable. No acute issues.

## 2015-04-08 NOTE — Progress Notes (Signed)
[facility] [place] NAME: Joseph [name]    MR#:  [id number]  DATE OF BIRTH:  Oct 25, 1965  SUBJECTIVE:  CHIEF COMPLAINT:  No chief complaint on file.  Back pain  REVIEW OF SYSTEMS:  CONSTITUTIONAL: No fever, generalized weakness.  EYES: No blurred or double vision.  EARS, NOSE, AND THROAT: No tinnitus or ear pain.  RESPIRATORY: No cough, shortness of breath, wheezing or hemoptysis.  CARDIOVASCULAR: No chest pain, orthopnea, edema.  GASTROINTESTINAL: has nausea, no vomiting, diarrhea or abdominal pain.  GENITOURINARY: No dysuria, hematuria.  ENDOCRINE: No polyuria, nocturia,  HEMATOLOGY: No anemia, easy bruising or bleeding SKIN: No rash or lesion. MUSCULOSKELETAL: back pain.   NEUROLOGIC: No tingling, numbness, weakness.  PSYCHIATRY: No anxiety or depression.   DRUG ALLERGIES:   Allergies  Allergen Reactions  . Ciprofloxacin Hcl   . Doxycycline Hyclate     VITALS:  Blood pressure 122/72, pulse 92, temperature 98.2 F (36.8 C), temperature source Oral, resp. rate 17, height 5\' 11"  (1.803 m), weight 96.8 kg (213 lb 6.5 oz), SpO2 97 %.  PHYSICAL EXAMINATION:  GENERAL:  49 y.o.-year-old patient lying in the bed with no acute distress.  EYES: Pupils equal, round, reactive to light and accommodation. No scleral icterus. Extraocular muscles intact.  HEENT: Head atraumatic, normocephalic. Oropharynx and nasopharynx clear. Moist oral mucosa. NECK:  Supple, no jugular venous distention. No thyroid enlargement, no tenderness.  LUNGS: Normal breath sounds bilaterally, no wheezing, rales,rhonchi or crepitation. No use of accessory muscles of respiration.  CARDIOVASCULAR: S1, S2 normal. No murmurs, rubs, or gallops.  ABDOMEN: Soft, nontender, nondistended. Bowel sounds present. No organomegaly or mass.  EXTREMITIES: No pedal edema, cyanosis, or clubbing.  NEUROLOGIC: Cranial nerves II through XII are intact. Muscle strength 4/5 in all  extremities. Sensation intact. Gait not checked.  PSYCHIATRIC: The patient is alert and oriented x 3.  SKIN: No obvious rash, lesion, or ulcer.    LABORATORY PANEL:   CBC  Recent Labs Lab 04/08/15 0620  WBC 18.1*  HGB 7.1*  HCT 22.2*  PLT 73*   ------------------------------------------------------------------------------------------------------------------  Chemistries   Recent Labs Lab 04/07/15 0439  04/08/15 0952  NA 144  < > 140  K 7.4*  < > 4.6  CL 119*  < > 103  CO2 13*  < > 31  GLUCOSE 161*  < > 133*  BUN 59*  < > 33*  CREATININE 4.86*  < > 3.07*  CALCIUM 8.2*  < > 8.2*  MG 1.6*  < > 1.6*  AST 223*  --   --   ALT 380*  --   --   ALKPHOS 92  --   --   BILITOT 0.4  --   --   < > = values in this interval not displayed. ------------------------------------------------------------------------------------------------------------------  Cardiac Enzymes No results for input(s): TROPONINI in the last 168 hours. ------------------------------------------------------------------------------------------------------------------  RADIOLOGY:  Ct Abdomen Pelvis Wo Contrast  04/07/2015  CLINICAL DATA:  Decreased blood pressure. Flank pain. Left renal biopsy yesterday. EXAM: CT ABDOMEN AND PELVIS WITHOUT CONTRAST TECHNIQUE: Multidetector CT imaging of the abdomen and pelvis was performed following the standard protocol without IV contrast. COMPARISON:  04/06/2015 FINDINGS: New development of small left pleural effusion with atelectasis in the left lung base. Linear atelectasis in both lung bases. Probable focal calcification in the left pleural space. Near circumferential acute appearing subcapsular hematoma around the left kidney. Maximal depth measures about 2.5 cm. Hematoma appears slightly smaller than  on previous study. There is hemorrhage and infiltration diffusely around the left perirenal fat. This is also similar to prior study. Moderately enlarged lymph nodes in the  retroperitoneum and periaortic region. These are nonspecific and could be reactive or metastatic. There is progression of hematoma and infiltration into the upper aspect of the retroperitoneum and in the gastrohepatic ligament as well as around the crus of the diaphragm and posterior subdiaphragmatic space since previous study. Small amount of new free fluid along the pericolic gutters and around the liver edge suggesting development of intraperitoneal hemorrhage. Small amount of fluid in the pelvis. The unenhanced appearance of the liver, spleen, gallbladder, pancreas, inferior vena cava are unremarkable. Calcification of the abdominal aorta without aneurysm. Exophytic cyst off the right kidney. No hydronephrosis in either kidney. Calcification of the abdominal aorta without aneurysm. Stomach, small bowel, and colon are not abnormally distended. Residual contrast material in the colon. No free air in the abdomen. Right adrenal gland is not enlarged. Left adrenal gland is obscured by the hemorrhage. Pelvis: Prostate gland appears mildly enlarged. Bladder wall is not thickened. No loculated pelvic fluid collections. No pelvic mass or lymphadenopathy. Appendix is not identified. No evidence of diverticulitis. With no destructive bone lesions. IMPRESSION: Moderately large subcapsular hematoma around the left kidney appears about the same as yesterday. Hemorrhagic stranding in the left perirenal fat is about the same. There is increasing hemorrhage in the retroperitoneum, celiac axis, gastrohepatic ligament, posterior subdiaphragmatic space, and with development of free fluid, likely intraperitoneal hemorrhage around the liver and extending into the pelvis. Enlarged retroperitoneal lymph nodes similar to previous study. New left pleural effusion with basilar atelectasis. These results were called by telephone at the time of interpretation on 04/07/2015 at 6:24 am to Dr. Anthonette Legato , who verbally acknowledged these  results. Electronically Signed   By: Lucienne Capers M.D.   On: 04/07/2015 06:31   Ct Abdomen Pelvis Wo Contrast  04/06/2015  CLINICAL DATA:  Left sided severe pain after percutaneous renal biopsy today EXAM: CT ABDOMEN AND PELVIS WITHOUT CONTRAST TECHNIQUE: Multidetector CT imaging of the abdomen and pelvis was performed following the standard protocol without IV contrast. COMPARISON:  Ultrasound from earlier the same day FINDINGS: There is a moderately large mixed attenuation left subcapsular hematoma around the kidney laterally from upper to lower pole, measuring approximately 3.2 cm maximum thickness, resulting in mild compression of the left renal parenchyma. There are streaky left perinephric retroperitoneal opacities, extending primarily medially and superiorly toward the adrenal gland. Visualized lung bases clear. Unremarkable liver, gallbladder, spleen, pancreas, adrenal glands. Unenhanced CT was performed per clinician order. Lack of IV contrast limits sensitivity and specificity, especially for evaluation of abdominal/pelvic solid viscera. There is a fluid attenuation exophytic 3.9 lesion from the upper pole right kidney presumably renal cyst but incompletely characterized. No hydronephrosis. Stomach, small bowel, and colon are nondilated. Urinary bladder physiologically distended. Mild prostatic enlargement. Bilateral pelvic phleboliths. Scattered aortoiliac arterial plaque without aneurysm. Increased number of prominent para-aortic and aortocaval lymph nodes, none greater than 1 cm short axis diameter. No ascites. No free air. Minimal spondylitic changes in the lumbar spine. Prominent protrusion L1-2. IMPRESSION: 1. Moderately large left subcapsular hematoma with mild compression of the left renal parenchyma. 2. Para-aortic and aortocaval retroperitoneal adenopathy, possibly reactive but nonspecific ; lymphoproliferative disease could have similar appearance. Critical Value/emergent results were  called by telephone at the time of interpretation on 04/06/2015 at 3:11 pm to Dr. Inocente Salles LATEEF , who verbally acknowledged these results.  Electronically Signed   By: Lucrezia Europe M.D.   On: 04/06/2015 15:13    EKG:   Orders placed or performed in visit on 04/06/14  . EKG 12-Lead    ASSESSMENT AND PLAN:   1. Acute renal failure/CKD stage IV with left scapsular hematoma after renal biopsy.  Perinephric hematoma developed which was initially subcapsular, later with extracapsular bleeding.Per Dr. Holley Raring. continue CRRT today and may need to embolize kidney if bleeding doesn't stop. It appears bleeding stabilized, doesn't appear we will need to embolize left kidney at this time  per Dr. Holley Raring.   * Hyperkalemia. Improved. K 4.7.  2.  Hpotension: improved.  3.Acute blood loss anemia due to 1. Hb increased from 6.6 to 7.5 after one unit prbc transfusion, but down to 7.1 today. PRBC tranfusion and F/u CBC.  4. Hypomagnesemia. Mag supplement.f/u level.  * Leukocytosis. Possible due to hematoma. F/u CBC. * Thrombocytopenia. Worsening, f/u CBC.  I discussed with Dr. Holley Raring.  All the records are reviewed and case discussed with Care Management/Social Workerr. Management plans discussed with the patient, his mother, and they are in agreement.  CODE STATUS: Full code.  TOTAL TIME TAKING CARE OF THIS PATIENT: 38 minutes.   POSSIBLE D/C IN 3 DAYS, DEPENDING ON CLINICAL CONDITION.   Demetrios Loll M.D on 04/08/2015 at 2:12 PM  Between 7am to 6pm - Pager - [phone]  After 6pm go to [email or web address] - password [email or web address] [place]  Office  [phone]  CC: Primary care physician; Pcp Not In System

## 2015-04-08 NOTE — Progress Notes (Signed)
PULMONARY / CRITICAL CARE MEDICINE   Name: Joseph [name] MRN: [id number] DOB: 06-12-1966    ADMISSION DATE:  04/06/2015  INITIAL PRESENTATION:  42 M underwent elective L kidney bx 10/10 c/b severe hemorrhage and hemorrhagic shock  MAJOR EVENTS/TEST RESULTS: 10/11 CTAP: Moderately large left subcapsular hematoma with mild compression of the left renal parenchyma 10/12 CTAP: Moderately large subcapsular hematoma around the left kidney appears about the same as yesterday. Hemorrhagic stranding in the left perirenal fat is about the same. There is increasing hemorrhage in the retroperitoneum, celiac axis, gastrohepatic ligament, posterior subdiaphragmatic space, and with development of free fluid, likely intraperitoneal hemorrhage around the liver and extending into the pelvis. Enlarged retroperitoneal lymph nodes similar to previous study. New left pleural effusion with basilar atelectasis 10/12 CRRT initiated  INDWELLING DEVICES:: R femoral HS cath 10/11 >>    MICRO DATA:   ANTIMICROBIALS:    SUBJECTIVE:  No new complaints. Still with mild flank pain and abd pain   VITAL SIGNS: Temp:  [97.7 F (36.5 C)-98.7 F (37.1 C)] 98.7 F (37.1 C) (10/13 0700) Pulse Rate:  [87-111] 100 (10/13 1200) Resp:  [9-30] 28 (10/13 1200) BP: (87-133)/(58-89) 121/84 mmHg (10/13 1200) SpO2:  [92 %-100 %] 98 % (10/13 1200) Weight:  [96.8 kg (213 lb 6.5 oz)] 96.8 kg (213 lb 6.5 oz) (10/13 0500) HEMODYNAMICS:   VENTILATOR SETTINGS:   INTAKE / OUTPUT:  Intake/Output Summary (Last 24 hours) at 04/08/15 1239 Last data filed at 04/08/15 0919  Gross per 24 hour  Intake 2771.67 ml  Output    775 ml  Net 1996.67 ml    PHYSICAL EXAMINATION: General: WDWN, NAD Neuro: No focal deficits HEENT: NCAT, WNL Cardiovascular: mildly tachy, reg, no M Lungs: clear anteriorly Abdomen: nondistended, mildy tender on L Ext: no edema  LABS:  CBC  Recent Labs Lab 04/07/15 1809 04/08/15 0030  04/08/15 0620  WBC 17.4* 18.4* 18.1*  HGB 8.1* 7.8* 7.1*  HCT 24.4* 23.5* 22.2*  PLT 93* 89* 73*   Coag's  Recent Labs Lab 04/05/15 0838 04/07/15 0439 04/07/15 1055 04/08/15 0620  APTT  --   --  28 37*  INR 1.04 1.28 1.34  --    BMET  Recent Labs Lab 04/08/15 0030 04/08/15 0620 04/08/15 0952  NA 140 138 140  K 4.5 4.0 4.6  CL 107 102 103  CO2 27 29 31   BUN 44* 36* 33*  CREATININE 3.88* 3.28* 3.07*  GLUCOSE 119* 116* 133*   Electrolytes  Recent Labs Lab 04/08/15 0030 04/08/15 0620 04/08/15 0952  CALCIUM 8.0* 7.9* 8.2*  MG 1.7 1.6* 1.6*  PHOS 4.2 3.9 3.5   Sepsis Markers No results for input(s): LATICACIDVEN, PROCALCITON, O2SATVEN in the last 168 hours. ABG No results for input(s): PHART, PCO2ART, PO2ART in the last 168 hours. Liver Enzymes  Recent Labs Lab 04/05/15 0838 04/07/15 0439  04/08/15 0030 04/08/15 0620 04/08/15 0952  AST 20 223*  --   --   --   --   ALT 29 380*  --   --   --   --   ALKPHOS 129* 92  --   --   --   --   BILITOT 0.5 0.4  --   --   --   --   ALBUMIN 4.1 3.4*  < > 3.1* 3.0* 2.8*  < > = values in this interval not displayed. Cardiac Enzymes No results for input(s): TROPONINI, PROBNP in the last 168 hours. Glucose  Recent Labs Lab  04/07/15 1625 04/08/15 0748 04/08/15 1142  GLUCAP 107* 117* 135*    CXR: no film   ASSESSMENT / PLAN: CARDIOVASCULAR A:  Hemorrhagic shock, resolved P:  Support with volume and blood products  RENAL A:   CKD - etiology unclear AKI due to shock P:   Monitor BMET intermittently Monitor I/Os Correct electrolytes as indicated CRRT through today per renal  GASTROINTESTINAL A:  Abd pain de to intra-abd hemorrhage Severe nausea P:   SUP: N/I Cont PRN anti-emetics Reg diet ordered  HEMATOLOGIC A:   Acute blood loss anemia Thrombocytopenia  Consumptive/dilutional coagulopathy P:  Transfuse PRBCs for shock and to maintain Hgb > 8.0 Consider transfusing platelets if  evidence of ongoing bleeding on subsequent CBCs  INFECTIOUS A:   No issues P:   Monitor temp, WBC count Micro and abx as above  ENDOCRINE A:   Mild stress induced hyperglycemia P:   Monitor glu on chem panels Consider SSI for glu > 180  NEUROLOGIC A:   Pain P:   Cont PRN dilaudid   Merton Border, MD PCCM service Mobile [phone] Pager [phone]  04/08/2015, 12:39 PM

## 2015-04-08 NOTE — Clinical Documentation Improvement (Addendum)
Internal Medicine  (if you agree please document your response in the progress notes and discharge summary, not on the query form itself.)  Please document if a condition below provides greater specificity regarding the patient's "AKI due to shock":  Possible Clinical Conditions:  - Acute Tubular Necrosis  (ATN)  - Other condition  - Unable to clinically determine  Clinical Information: "AKI due to shock' is documented by Dr. Simonds in PCCM consult note 04/07/15 at 3:18 pm.  BUNCR/GFR trend this admission    (white male) Component     Latest Ref Rng 04/05/2015 04/07/2015 04/07/2015 04/07/2015          4:39 AM 10:55 AM  6:09 PM  BUN     6 - 20 mg/dL 49 (H) 59 (H) 61 (H) 49 (H)  Creatinine     0.61 - 1.24 mg/dL 2.79 (H) 4.86 (H) 5.11 (H) 4.30 (H)  EGFR (Non-African Amer.)     >60 mL/min 25 (L) 13 (L) 12 (L) 15 (L)   Component     Latest Ref Rng 04/08/2015 04/08/2015        12:30 AM  6:20 AM  BUN     6 - 20 mg/dL 44 (H) 36 (H)  Creatinine     0.61 - 1.24 mg/dL 3.88 (H) 3.28 (H)  EGFR (Non-African Amer.)     >60 mL/min 17 (L) 20 (L)    Please exercise your independent, professional judgment when responding. A specific answer is not anticipated or expected.   Thank You,  R   RN BSN CCDS [phone] Health Information Management [place]      

## 2015-04-08 NOTE — Progress Notes (Signed)
[facility]  ROUNDING NOTE   Subjective:  Significant improvement noted today. Still on CRRT. K has normalized now.  Acidosis also corrected.  No abdominal pain at the moment.  Last hgb was 7.8.   Awaiting repeat hgb this AM.   Objective:  Vital signs in last 24 hours:  Temp:  [97.6 F (36.4 C)-98.7 F (37.1 C)] 98.7 F (37.1 C) (10/13 0700) Pulse Rate:  [83-111] 100 (10/13 0700) Resp:  [9-30] 19 (10/13 0700) BP: (61-133)/(43-83) 116/77 mmHg (10/13 0700) SpO2:  [92 %-100 %] 95 % (10/13 0700) Weight:  [96.8 kg (213 lb 6.5 oz)] 96.8 kg (213 lb 6.5 oz) (10/13 0500)  Weight change: 6.988 kg (15 lb 6.5 oz) Filed Weights   04/06/15 0850 04/07/15 0700 04/08/15 0500  Weight: 89.812 kg (198 lb) 92.2 kg (203 lb 4.2 oz) 96.8 kg (213 lb 6.5 oz)    Intake/Output: I/O last 3 completed shifts: In: 3954 [I.V.:2374; Blood:1430; Other:100; IV Piggyback:50] Out: [id number] [Urine:775]   Intake/Output this shift:     Physical Exam: General: NAD  Head: Normocephalic, atraumatic. Moist oral mucosal membranes  Eyes: Anicteric, pallor noted  Neck: Supple, trachea midline  Lungs:  Clear to auscultation normal effort  Heart: Regular rate and rhythm no rubs  Abdomen:  Soft, NTND, BS present  Extremities:  no peripheral edema.  Neurologic: Nonfocal, moving all four extremities  Skin: No lesions  Access: Right femoral temp HD catheter.    Basic Metabolic Panel:  Recent Labs Lab 04/07/15 0439 04/07/15 1055 04/07/15 1809 04/08/15 0030 04/08/15 0620  NA 144 143 141 140 138  K 7.4* 7.4* 4.7 4.5 4.0  CL 119* 119* 110 107 102  CO2 13* 11* 26 27 29   GLUCOSE 161* 157* 124* 119* 116*  BUN 59* 61* 49* 44* 36*  CREATININE 4.86* 5.11* 4.30* 3.88* 3.28*  CALCIUM 8.2* 8.0* 8.0* 8.0* 7.9*  MG 1.6* 2.3 1.8 1.7 1.6*  PHOS  --  5.6* 5.0* 4.2 3.9    Liver Function Tests:  Recent Labs Lab 04/05/15 0838 04/07/15 0439 04/07/15 1055 04/07/15 1809 04/08/15 0030 04/08/15 0620  AST  20 223*  --   --   --   --   ALT 29 380*  --   --   --   --   ALKPHOS 129* 92  --   --   --   --   BILITOT 0.5 0.4  --   --   --   --   PROT 7.5 6.2*  --   --   --   --   ALBUMIN 4.1 3.4* 3.4* 3.0* 3.1* 3.0*   No results for input(s): LIPASE, AMYLASE in the last 168 hours. No results for input(s): AMMONIA in the last 168 hours.  CBC:  Recent Labs Lab 04/05/15 0838  04/06/15 2158 04/07/15 0338 04/07/15 0439 04/07/15 1055 04/07/15 1809 04/08/15 0030  WBC 9.4  < > 15.6* 16.4* 13.7*  --  17.4* 18.4*  NEUTROABS 5.6  --   --   --   --   --   --   --   HGB 10.2*  < > 6.6* 8.2* 7.5*  --  8.1* 7.8*  HCT 32.0*  < > 21.3* 25.4* 23.0*  --  24.4* 23.5*  MCV 86.6  < > 88.0 86.5 87.6  --  85.0 85.3  PLT 246  < > 190 180 177 148* 93* 89*  < > = values in this interval not displayed.  Cardiac Enzymes:  No results for input(s): CKTOTAL, CKMB, CKMBINDEX, TROPONINI in the last 168 hours.  BNP: Invalid input(s): POCBNP  CBG:  Recent Labs Lab 04/07/15 1625 04/08/15 0748  GLUCAP 107* 117*    Microbiology: Results for orders placed or performed during the hospital encounter of 04/06/15  MRSA PCR Screening     Status: None   Collection Time: 04/07/15  9:49 AM  Result Value Ref Range Status   MRSA by PCR NEGATIVE NEGATIVE Final    Comment:        The GeneXpert MRSA Assay (FDA approved for NASAL specimens only), is one component of a comprehensive MRSA colonization surveillance program. It is not intended to diagnose MRSA infection nor to guide or monitor treatment for MRSA infections.     Coagulation Studies:  Recent Labs  04/07/15 0439 04/07/15 1055  LABPROT 16.2* 16.8*  INR 1.28 1.34    Urinalysis: No results for input(s): COLORURINE, LABSPEC, PHURINE, GLUCOSEU, HGBUR, BILIRUBINUR, KETONESUR, PROTEINUR, UROBILINOGEN, NITRITE, LEUKOCYTESUR in the last 72 hours.  Invalid input(s): APPERANCEUR    Imaging: Ct Abdomen Pelvis Wo Contrast  04/07/2015  CLINICAL DATA:   Decreased blood pressure. Flank pain. Left renal biopsy yesterday. EXAM: CT ABDOMEN AND PELVIS WITHOUT CONTRAST TECHNIQUE: Multidetector CT imaging of the abdomen and pelvis was performed following the standard protocol without IV contrast. COMPARISON:  04/06/2015 FINDINGS: New development of small left pleural effusion with atelectasis in the left lung base. Linear atelectasis in both lung bases. Probable focal calcification in the left pleural space. Near circumferential acute appearing subcapsular hematoma around the left kidney. Maximal depth measures about 2.5 cm. Hematoma appears slightly smaller than on previous study. There is hemorrhage and infiltration diffusely around the left perirenal fat. This is also similar to prior study. Moderately enlarged lymph nodes in the retroperitoneum and periaortic region. These are nonspecific and could be reactive or metastatic. There is progression of hematoma and infiltration into the upper aspect of the retroperitoneum and in the gastrohepatic ligament as well as around the crus of the diaphragm and posterior subdiaphragmatic space since previous study. Small amount of new free fluid along the pericolic gutters and around the liver edge suggesting development of intraperitoneal hemorrhage. Small amount of fluid in the pelvis. The unenhanced appearance of the liver, spleen, gallbladder, pancreas, inferior vena cava are unremarkable. Calcification of the abdominal aorta without aneurysm. Exophytic cyst off the right kidney. No hydronephrosis in either kidney. Calcification of the abdominal aorta without aneurysm. Stomach, small bowel, and colon are not abnormally distended. Residual contrast material in the colon. No free air in the abdomen. Right adrenal gland is not enlarged. Left adrenal gland is obscured by the hemorrhage. Pelvis: Prostate gland appears mildly enlarged. Bladder wall is not thickened. No loculated pelvic fluid collections. No pelvic mass or  lymphadenopathy. Appendix is not identified. No evidence of diverticulitis. With no destructive bone lesions. IMPRESSION: Moderately large subcapsular hematoma around the left kidney appears about the same as yesterday. Hemorrhagic stranding in the left perirenal fat is about the same. There is increasing hemorrhage in the retroperitoneum, celiac axis, gastrohepatic ligament, posterior subdiaphragmatic space, and with development of free fluid, likely intraperitoneal hemorrhage around the liver and extending into the pelvis. Enlarged retroperitoneal lymph nodes similar to previous study. New left pleural effusion with basilar atelectasis. These results were called by telephone at the time of interpretation on 04/07/2015 at 6:24 am to Dr. Anthonette Legato , who verbally acknowledged these results. Electronically Signed   By: Oren Beckmann.D.  On: 04/07/2015 06:31   Ct Abdomen Pelvis Wo Contrast  04/06/2015  CLINICAL DATA:  Left sided severe pain after percutaneous renal biopsy today EXAM: CT ABDOMEN AND PELVIS WITHOUT CONTRAST TECHNIQUE: Multidetector CT imaging of the abdomen and pelvis was performed following the standard protocol without IV contrast. COMPARISON:  Ultrasound from earlier the same day FINDINGS: There is a moderately large mixed attenuation left subcapsular hematoma around the kidney laterally from upper to lower pole, measuring approximately 3.2 cm maximum thickness, resulting in mild compression of the left renal parenchyma. There are streaky left perinephric retroperitoneal opacities, extending primarily medially and superiorly toward the adrenal gland. Visualized lung bases clear. Unremarkable liver, gallbladder, spleen, pancreas, adrenal glands. Unenhanced CT was performed per clinician order. Lack of IV contrast limits sensitivity and specificity, especially for evaluation of abdominal/pelvic solid viscera. There is a fluid attenuation exophytic 3.9 lesion from the upper pole right  kidney presumably renal cyst but incompletely characterized. No hydronephrosis. Stomach, small bowel, and colon are nondilated. Urinary bladder physiologically distended. Mild prostatic enlargement. Bilateral pelvic phleboliths. Scattered aortoiliac arterial plaque without aneurysm. Increased number of prominent para-aortic and aortocaval lymph nodes, none greater than 1 cm short axis diameter. No ascites. No free air. Minimal spondylitic changes in the lumbar spine. Prominent protrusion L1-2. IMPRESSION: 1. Moderately large left subcapsular hematoma with mild compression of the left renal parenchyma. 2. Para-aortic and aortocaval retroperitoneal adenopathy, possibly reactive but nonspecific ; lymphoproliferative disease could have similar appearance. Critical Value/emergent results were called by telephone at the time of interpretation on 04/06/2015 at 3:11 pm to Dr. Inocente Salles Zariana Strub , who verbally acknowledged these results. Electronically Signed   By: Lucrezia Europe M.D.   On: 04/06/2015 15:13   US Biopsy-no Radiologist  04/06/2015  CLINICAL DATA:  Ultrasound was utilized for biopsy by the requesting physician.     Medications:   . pureflow 2,500 mL/hr at 04/08/15 0220  .  sodium bicarbonate 150 mEq in sterile water 1000 mL infusion 100 mL/hr at 04/07/15 2348   . antiseptic oral rinse  7 mL Mouth Rinse BID  . Influenza vac split quadrivalent PF  0.5 mL Intramuscular Tomorrow-1000   acetaminophen, heparin, HYDROmorphone (DILAUDID) injection, ondansetron (ZOFRAN) IV, oxyCODONE-acetaminophen  Assessment/ Plan:  49 y.o. male with past medical history of hypertension, hyperlipidemia, anemia chronic kidney disease, peptic ulcer disease, CKD stage IV, anemia of CKD proteinuria, hyperkalemia s/p renal bioy 04/06/15 with subsequent left perinephric hematoma.  1.  Acute renal failure/CKD stage IV/Proteinuria:  Pt s/p left renal biopsy. Perinephric hematoma developed which was initially subcapsular,  subsequently extracapsular bleeding noted. -metabolic parameters all improved, K down to 4.0, acidosis corrected.  UOP 775cc thus far.  Will continue CRRT for the moment.  Hopefully UOP will improve during the course of the day.  Suspect bleeding has stopped.   2.  Left perinephric hematoma:  Noted post biopsy, was initially subcapsular, now extracapsular.   -It appears bleeding stabilized.  Will continue to monitor H/H for now.  Transfuse prbcs/ffp as necessary.  Doesn't appear we will need to embolize left kidney at this time, vascular surgery following.   3.  Post hemorrhagic anemia:  hgb at last check was 7.8, awaiting new CBC this AM.    4.  Hyperkalemia:  Resolved, remains on 2k bath during CRRT.      LOS: 1 Soul Deveney 10/13/20168:59 AM

## 2015-04-09 LAB — CBC
HCT: 20.6 % — ABNORMAL LOW (ref 40.0–52.0)
HEMATOCRIT: 22.7 % — AB (ref 40.0–52.0)
Hemoglobin: 7.5 g/dL — ABNORMAL LOW (ref 13.0–18.0)
Hemoglobin: 6.7 g/dL — ABNORMAL LOW (ref 13.0–18.0)
MCH: 28.5 pg (ref 26.0–34.0)
MCH: 28 pg (ref 26.0–34.0)
MCHC: 33.1 g/dL (ref 32.0–36.0)
MCHC: 32.7 g/dL (ref 32.0–36.0)
MCV: 86.2 fL (ref 80.0–100.0)
MCV: 85.6 fL (ref 80.0–100.0)
PLATELETS: 89 10*3/uL — AB (ref 150–440)
Platelets: 57 10*3/uL — ABNORMAL LOW (ref 150–440)
RBC: 2.64 MIL/uL — ABNORMAL LOW (ref 4.40–5.90)
RBC: 2.4 MIL/uL — AB (ref 4.40–5.90)
RDW: 14.5 % (ref 11.5–14.5)
RDW: 14.4 % (ref 11.5–14.5)
WBC: 17.4 10*3/uL — ABNORMAL HIGH (ref 3.8–10.6)
WBC: 12.5 10*3/uL — AB (ref 3.8–10.6)

## 2015-04-09 LAB — RENAL FUNCTION PANEL
ANION GAP: 4 — AB (ref 5–15)
Albumin: 2.9 g/dL — ABNORMAL LOW (ref 3.5–5.0)
BUN: 30 mg/dL — ABNORMAL HIGH (ref 6–20)
CALCIUM: 8.3 mg/dL — AB (ref 8.9–10.3)
CHLORIDE: 108 mmol/L (ref 101–111)
CO2: 30 mmol/L (ref 22–32)
Creatinine, Ser: 2.94 mg/dL — ABNORMAL HIGH (ref 0.61–1.24)
GFR calc Af Amer: 27 mL/min — ABNORMAL LOW (ref >60)
GFR calc non Af Amer: 23 mL/min — ABNORMAL LOW (ref >60)
GLUCOSE: 119 mg/dL — AB (ref 65–99)
Phosphorus: 2.6 mg/dL (ref 2.5–4.6)
Potassium: 4.5 mmol/L (ref 3.5–5.1)
SODIUM: 142 mmol/L (ref 135–145)

## 2015-04-09 LAB — GLUCOSE, CAPILLARY
GLUCOSE-CAPILLARY: 110 mg/dL — AB (ref 65–99)
GLUCOSE-CAPILLARY: 110 mg/dL — AB (ref 65–99)
GLUCOSE-CAPILLARY: 164 mg/dL — AB (ref 65–99)
Glucose-Capillary: 103 mg/dL — ABNORMAL HIGH (ref 65–99)
Glucose-Capillary: 113 mg/dL — ABNORMAL HIGH (ref 65–99)

## 2015-04-09 LAB — PREPARE RBC (CROSSMATCH)

## 2015-04-09 LAB — PROTIME-INR
INR: 1.54
Prothrombin Time: 18.7 seconds — ABNORMAL HIGH (ref 11.4–15.0)

## 2015-04-09 LAB — APTT: aPTT: 43 seconds — ABNORMAL HIGH (ref 24–36)

## 2015-04-09 LAB — MAGNESIUM: MAGNESIUM: 1.7 mg/dL (ref 1.7–2.4)

## 2015-04-09 MED ORDER — LIDOCAINE HCL (PF) 1 % IJ SOLN: 5.0000 mL | INTRAMUSCULAR | Status: DC | PRN

## 2015-04-09 MED ORDER — PENTAFLUOROPROP-TETRAFLUOROETH EX AERO: 1.0000 "application " | INHALATION_SPRAY | CUTANEOUS | Status: DC | PRN

## 2015-04-09 MED ORDER — SODIUM CHLORIDE 0.9 % IV SOLN
Freq: Once | INTRAVENOUS | Status: AC
  Administered 2015-04-09: 22:00:00 via INTRAVENOUS

## 2015-04-09 MED ORDER — LIDOCAINE-PRILOCAINE 2.5-2.5 % EX CREA: 1.0000 "application " | TOPICAL_CREAM | CUTANEOUS | Status: DC | PRN

## 2015-04-09 MED ORDER — SODIUM CHLORIDE 0.9 % IV SOLN: 100.0000 mL | INTRAVENOUS | Status: DC | PRN

## 2015-04-09 MED ORDER — SODIUM CHLORIDE 0.9 % IV SOLN
Freq: Once | INTRAVENOUS | Status: AC
  Administered 2015-04-09: 10:00:00 via INTRAVENOUS

## 2015-04-09 MED ORDER — ALTEPLASE 2 MG IJ SOLR: 2.0000 mg | Freq: Once | INTRAMUSCULAR | Status: DC | PRN

## 2015-04-09 MED ORDER — PANTOPRAZOLE SODIUM 40 MG IV SOLR
40.0000 mg | Freq: Two times a day (BID) | INTRAVENOUS | Status: DC
  Administered 2015-04-09 – 2015-04-12 (×7): 40 mg via INTRAVENOUS
  Filled 2015-04-09 (×7): qty 40

## 2015-04-09 MED ORDER — HEPARIN SODIUM (PORCINE) 1000 UNIT/ML DIALYSIS
1000.0000 [IU] | INTRAMUSCULAR | Status: DC | PRN
  Administered 2015-04-11: 2800 [IU] via INTRAVENOUS_CENTRAL

## 2015-04-09 MED ORDER — HEPARIN SODIUM (PORCINE) 1000 UNIT/ML IJ SOLN
1000.0000 [IU] | Freq: Once | INTRAMUSCULAR | Status: AC
  Administered 2015-04-09: 3400 [IU]

## 2015-04-09 MED ORDER — SODIUM CHLORIDE 0.9 % IV SOLN
100.0000 mL | INTRAVENOUS | Status: DC | PRN
  Administered 2015-04-10: 100 mL via INTRAVENOUS

## 2015-04-09 NOTE — Progress Notes (Signed)
[facility] [place] NAME: Joseph [name]    MR#:  [id number]  DATE OF BIRTH:  [birth year]  SUBJECTIVE:  CHIEF COMPLAINT:  No chief complaint on file.  no complaint REVIEW OF SYSTEMS:  CONSTITUTIONAL: No fever, no weakness.  EYES: No blurred or double vision.  EARS, NOSE, AND THROAT: No tinnitus or ear pain.  RESPIRATORY: No cough, shortness of breath, wheezing or hemoptysis.  CARDIOVASCULAR: No chest pain, orthopnea, edema.  GASTROINTESTINAL: has nausea, no vomiting, diarrhea or abdominal pain.  GENITOURINARY: No dysuria, hematuria.  ENDOCRINE: No polyuria, nocturia,  HEMATOLOGY: No anemia, easy bruising or bleeding SKIN: No rash or lesion. MUSCULOSKELETAL: back pain.   NEUROLOGIC: No tingling, numbness, weakness.  PSYCHIATRY: No anxiety or depression.   DRUG ALLERGIES:   Allergies  Allergen Reactions  . Ciprofloxacin Hcl   . Doxycycline Hyclate     VITALS:  Blood pressure 133/83, pulse 104, temperature 98.4 F (36.9 C), temperature source Oral, resp. rate 30, height 5\' 11"  (1.803 m), weight 97.7 kg (215 lb 6.2 oz), SpO2 91 %.  PHYSICAL EXAMINATION:  GENERAL:  49 y.o.-year-old patient lying in the bed with no acute distress.  EYES: Pupils equal, round, reactive to light and accommodation. No scleral icterus. Extraocular muscles intact.  HEENT: Head atraumatic, normocephalic. Oropharynx and nasopharynx clear. Moist oral mucosa. NECK:  Supple, no jugular venous distention. No thyroid enlargement, no tenderness.  LUNGS: Normal breath sounds bilaterally, no wheezing, rales,rhonchi or crepitation. No use of accessory muscles of respiration.  CARDIOVASCULAR: S1, S2 normal. No murmurs, rubs, or gallops.  ABDOMEN: Soft, nontender, nondistended. Bowel sounds present. No organomegaly or mass.  EXTREMITIES: No pedal edema, cyanosis, or clubbing.  NEUROLOGIC: Cranial nerves II through XII are intact. Muscle strength 4/5 in all  extremities. Sensation intact. Gait not checked.  PSYCHIATRIC: The patient is alert and oriented x 3.  SKIN: No obvious rash, lesion, or ulcer.    LABORATORY PANEL:   CBC  Recent Labs Lab 04/09/15 0431  WBC 17.4*  HGB 7.5*  HCT 22.7*  PLT 57*   ------------------------------------------------------------------------------------------------------------------  Chemistries   Recent Labs Lab 04/07/15 0439  04/09/15 0431 04/09/15 0432  NA 144  < >  --  142  K 7.4*  < >  --  4.5  CL 119*  < >  --  108  CO2 13*  < >  --  30  GLUCOSE 161*  < >  --  119*  BUN 59*  < >  --  30*  CREATININE 4.86*  < >  --  2.94*  CALCIUM 8.2*  < >  --  8.3*  MG 1.6*  < > 1.7  --   AST 223*  --   --   --   ALT 380*  --   --   --   ALKPHOS 92  --   --   --   BILITOT 0.4  --   --   --   < > = values in this interval not displayed. ------------------------------------------------------------------------------------------------------------------  Cardiac Enzymes No results for input(s): TROPONINI in the last 168 hours. ------------------------------------------------------------------------------------------------------------------  RADIOLOGY:  No results found.  EKG:   Orders placed or performed in visit on 04/06/14  . EKG 12-Lead    ASSESSMENT AND PLAN:   1. Acute renal failure/CKD stage IV with left scapsular hematoma after renal biopsy.  Perinephric hematoma developed which was initially subcapsular, later with extracapsular bleeding.Per Dr. Holley Raring. continue CRRT and need  HD. It appears bleeding stabilized, no need to embolize left kidney at this time  per Dr. Holley Raring.   * Hyperkalemia. Improved. K 4.5.  2.  Hpotension: improved.  3.Acute blood loss anemia due to 1. Hb increased from 6.6 to 7.5 after 2 unit PRBC tranfusion and F/u CBC.  4. Hypomagnesemia. Mag supplement. Improved.  * Leukocytosis. Possible due to hematoma. F/u CBC. * Thrombocytopenia. Worsening, f/u  CBC.  I discussed with Dr. Holley Raring.  All the records are reviewed and case discussed with Care Management/Social Workerr. Management plans discussed with the patient, his wife and mother, and they are in agreement.  CODE STATUS: Full code.  TOTAL TIME TAKING CARE OF THIS PATIENT: 37 minutes.   POSSIBLE D/C IN 3 DAYS, DEPENDING ON CLINICAL CONDITION.   Demetrios Loll M.D on 04/09/2015 at 1:06 PM  Between 7am to 6pm - Pager - [phone]  After 6pm go to [email or web address] - password [email or web address] [facility]  Office  [phone]  CC: Primary care physician; Pcp Not In System

## 2015-04-09 NOTE — Progress Notes (Signed)
[facility]  ROUNDING NOTE   Subjective:  Pt seen at bedside.  Doing well. Reports some reflux symptoms. Denies abdominal pain at the moment. Hgb slowly drifts down. Platelets also down. Case discussed with Dr. Alva Garnet.   Objective:  Vital signs in last 24 hours:  Temp:  [98.2 F (36.8 C)-99.2 F (37.3 C)] 98.4 F (36.9 C) (10/14 0729) Pulse Rate:  [83-107] 106 (10/14 0800) Resp:  [11-30] 25 (10/14 0800) BP: (111-136)/(72-91) 136/81 mmHg (10/14 0800) SpO2:  [90 %-99 %] 98 % (10/14 0800) Weight:  [97.7 kg (215 lb 6.2 oz)] 97.7 kg (215 lb 6.2 oz) (10/14 0427)  Weight change: 0.9 kg (1 lb 15.8 oz) Filed Weights   04/07/15 0700 04/08/15 0500 04/09/15 0427  Weight: 92.2 kg (203 lb 4.2 oz) 96.8 kg (213 lb 6.5 oz) 97.7 kg (215 lb 6.2 oz)    Intake/Output: I/O last 3 completed shifts: In: 2771.7 [P.O.:640; I.V.:1741.7; Blood:290; Other:100] Out: 1240 [Urine:1240]   Intake/Output this shift:     Physical Exam: General: NAD  Head: Normocephalic, atraumatic. Moist oral mucosal membranes  Eyes: Anicteric, pallor noted  Neck: Supple, trachea midline  Lungs:  Clear to auscultation normal effort  Heart: Regular rate and rhythm no rubs  Abdomen:  Soft, NTND, BS present  Extremities:  no peripheral edema.  Neurologic: Nonfocal, moving all four extremities  Skin: No lesions  Access: Right femoral temp HD catheter.    Basic Metabolic Panel:  Recent Labs Lab 04/08/15 0030 04/08/15 0620 04/08/15 0952 04/08/15 1724 04/08/15 2231 04/09/15 0431 04/09/15 0432  NA 140 138 140 140 140  --  142  K 4.5 4.0 4.6 4.9 4.6  --  4.5  CL 107 102 103 105 105  --  108  CO2 27 29 31 31 30   --  30  GLUCOSE 119* 116* 133* 125* 119*  --  119*  BUN 44* 36* 33* 29* 30*  --  30*  CREATININE 3.88* 3.28* 3.07* 2.87* 2.91*  --  2.94*  CALCIUM 8.0* 7.9* 8.2* 8.2* 8.4*  --  8.3*  MG 1.7 1.6* 1.6* 1.7  --  1.7  --   PHOS 4.2 3.9 3.5 3.2 2.6  --  2.6    Liver Function  Tests:  Recent Labs Lab 04/05/15 0838 04/07/15 0439  04/08/15 0620 04/08/15 0952 04/08/15 1724 04/08/15 2231 04/09/15 0432  AST 20 223*  --   --   --   --   --   --   ALT 29 380*  --   --   --   --   --   --   ALKPHOS 129* 92  --   --   --   --   --   --   BILITOT 0.5 0.4  --   --   --   --   --   --   PROT 7.5 6.2*  --   --   --   --   --   --   ALBUMIN 4.1 3.4*  < > 3.0* 2.8* 3.0* 3.0* 2.9*  < > = values in this interval not displayed. No results for input(s): LIPASE, AMYLASE in the last 168 hours. No results for input(s): AMMONIA in the last 168 hours.  CBC:  Recent Labs Lab 04/05/15 0838  04/08/15 0620 04/08/15 0952 04/08/15 1724 04/08/15 2231 04/09/15 0431  WBC 9.4  < > 18.1* 18.4* 17.5* 17.3* 17.4*  NEUTROABS 5.6  --   --   --   --   --   --  HGB 10.2*  < > 7.1* 7.0* 7.7* 7.6* 7.5*  HCT 32.0*  < > 22.2* 21.6* 23.3* 23.4* 22.7*  MCV 86.6  < > 86.0 85.5 85.3 85.1 86.2  PLT 246  < > 73* 78* 65* 64* 57*  < > = values in this interval not displayed.  Cardiac Enzymes: No results for input(s): CKTOTAL, CKMB, CKMBINDEX, TROPONINI in the last 168 hours.  BNP: Invalid input(s): POCBNP  CBG:  Recent Labs Lab 04/08/15 1557 04/08/15 1948 04/08/15 2333 04/09/15 0411 04/09/15 0745  GLUCAP 130* 125* 103* 110* 110*    Microbiology: Results for orders placed or performed during the hospital encounter of 04/06/15  MRSA PCR Screening     Status: None   Collection Time: 04/07/15  9:49 AM  Result Value Ref Range Status   MRSA by PCR NEGATIVE NEGATIVE Final    Comment:        The GeneXpert MRSA Assay (FDA approved for NASAL specimens only), is one component of a comprehensive MRSA colonization surveillance program. It is not intended to diagnose MRSA infection nor to guide or monitor treatment for MRSA infections.     Coagulation Studies:  Recent Labs  04/07/15 0439 04/07/15 1055 04/09/15 0431  LABPROT 16.2* 16.8* 18.7*  INR 1.28 1.34 1.54     Urinalysis: No results for input(s): COLORURINE, LABSPEC, PHURINE, GLUCOSEU, HGBUR, BILIRUBINUR, KETONESUR, PROTEINUR, UROBILINOGEN, NITRITE, LEUKOCYTESUR in the last 72 hours.  Invalid input(s): APPERANCEUR    Imaging: No results found.   Medications:   . pureflow 15,000 mL (04/08/15 1600)   . sodium chloride   Intravenous Once  . antiseptic oral rinse  7 mL Mouth Rinse BID  . Influenza vac split quadrivalent PF  0.5 mL Intramuscular Tomorrow-1000  . pantoprazole (PROTONIX) IV  40 mg Intravenous Q24H   acetaminophen, heparin, HYDROmorphone (DILAUDID) injection, ondansetron (ZOFRAN) IV, oxyCODONE-acetaminophen  Assessment/ Plan:  49 y.o. male with past medical history of hypertension, hyperlipidemia, anemia chronic kidney disease, peptic ulcer disease, CKD stage IV, anemia of CKD proteinuria, hyperkalemia s/p renal bioy 04/06/15 with subsequent left perinephric hematoma.  1.  Acute renal failure/CKD stage IV/Proteinuria:  Pt s/p left renal biopsy. Perinephric hematoma developed which was initially subcapsular, subsequently extracapsular bleeding noted. -UOP improved over the past 24 hours.  Will stop CRRT today after platelets infused.  Thereafter will convert to IHD today and then plan to hold dialysis over the weekend unless metabolic deranagements noted.  Awaiting result of renal biopsy.   2.  Left perinephric hematoma:  Noted post biopsy, was initially subcapsular, now extracapsular.   -Hgb slowly drifting down.  Has received multiple units of blood.  Pt to receive platelet transfusion today.  3.  Post hemorrhagic anemia:  Most recent hgb slightly lower at 7.5, no plan for prbc transfusion today, will plan to give platelets as above.  4.  Hyperkalemia:  Resolved with CRRT.       LOS: 2 Joseph [name] 10/14/20169:13 AM

## 2015-04-09 NOTE — Progress Notes (Signed)
At 3am patients CRRT filter clotted and unable to return patients blood.  Had to heparinize vascath while getting new cartridge set up.  Unable to recover readings for 2am and no readings for 3am d/t getting new cartridge set up.  Pt remained stable through this process and CRRT has resumed at same settings.

## 2015-04-09 NOTE — Care Management Note (Signed)
Case Management Note  Patient Details  Name: Joseph [name] MRN: [id number] Date of Birth: 03/14/1966  Subjective/Objective:   Admitted for elective renal biopsy. Developed hemorrhage from a renal biopsy, hyperkalemia and ARF requiring  dialysis. Currently receiving CRRT. HGB 7.5. Has received transfusions. Iran Sizer, dialysis coordinator notified of current dialysis so to monitor for further needs.                   Action/Plan:   Expected Discharge Date:  04/07/15               Expected Discharge Plan:     In-House Referral:     Discharge planning Services  CM Consult  Post Acute Care Choice:    Choice offered to:     DME Arranged:    DME Agency:     HH Arranged:    HH Agency:     Status of Service:  In process, will continue to follow  Medicare Important Message Given:    Date Medicare IM Given:    Medicare IM give by:    Date Additional Medicare IM Given:    Additional Medicare Important Message give by:     If discussed at [place] of Stay Meetings, dates discussed:    Additional Comments:  Jolly Mango, RN 04/09/2015, 8:58 AM

## 2015-04-09 NOTE — Progress Notes (Signed)
Nutrition Follow-up     INTERVENTION:  Meals and snacks: Cater to pt preferences   NUTRITION DIAGNOSIS:   Increased nutrient needs related to acute illness as evidenced by estimated needs, being addressed with on po diet order in place    GOAL:   Patient will meet greater than or equal to 90% of their needs  Progressing towards goal  MONITOR:    (Energy Intake, Electrolyte and Renal Profile, Anthropometrics, UOP, Digestive system)  REASON FOR ASSESSMENT:   Malnutrition Screening Tool    ASSESSMENT:     Planning to stop CRRT today and transition to HD today, holdiing on dialysis over the weekend pending labs   Current Nutrition: no breakfast eaten secondary to reflux, medication given   Food/Nutrition History: Pt reports decreased intake since being on medical leave starting in August   Gastrointestinal Profile: Last BM: 10/9   Medications: protonix  Electrolyte/Renal Profile and Glucose Profile:   Recent Labs Lab 04/08/15 0952 04/08/15 1724 04/08/15 2231 04/09/15 0431 04/09/15 0432  NA 140 140 140  --  142  K 4.6 4.9 4.6  --  4.5  CL 103 105 105  --  108  CO2 31 31 30   --  30  BUN 33* 29* 30*  --  30*  CREATININE 3.07* 2.87* 2.91*  --  2.94*  CALCIUM 8.2* 8.2* 8.4*  --  8.3*  MG 1.6* 1.7  --  1.7  --   PHOS 3.5 3.2 2.6  --  2.6  GLUCOSE 133* 125* 119*  --  119*   Protein Profile:  Recent Labs Lab 04/08/15 1724 04/08/15 2231 04/09/15 0432  ALBUMIN 3.0* 3.0* 2.9*     Nutrition-Focused Physical Exam Findings: Nutrition-Focused physical exam completed. Findings are WDL for fat depletion, muscle depletion, and edema.    Weight loss:  Pt reports wt of 210 in August and wt loss down to 197 pounds.  Current wt trends during admission in the 200s.   Weight Trend since Admission: Filed Weights   04/07/15 0700 04/08/15 0500 04/09/15 0427  Weight: 203 lb 4.2 oz (92.2 kg) 213 lb 6.5 oz (96.8 kg) 215 lb 6.2 oz (97.7 kg)      Diet Order:   Diet regular Room service appropriate?: Yes; Fluid consistency:: Thin  Skin:  Reviewed, no issues  Last BM:  04/04/2015  Height:   Ht Readings from Last 1 Encounters:  04/06/15 5\' 11"  (1.803 m)    Weight:   Wt Readings from Last 1 Encounters:  04/09/15 215 lb 6.2 oz (97.7 kg)     BMI:  Body mass index is 30.05 kg/(m^2).  Estimated Nutritional Needs:   Kcal:  BEE: 1610kcals, TEE: (IF 1.1-1.3)(AF 1.2) 2125-2511kcals  Protein:  107-135g protein (1.2-1.5g/kg)  Fluid:  UOP+1L  EDUCATION NEEDS:   Education needs no appropriate at this time  LOW Care Level  Joseph [name] B. Zenia Resides, Start, [place] (pager)

## 2015-04-09 NOTE — Progress Notes (Signed)
Discussed care plan with Dr Holley Raring inc rec to transfuse plts today. His shock has resolved. He is SUD status today and will likely transfer out tomorrow  PCCM will sign off. Please call if we can be of further assistance  Joseph Border, MD PCCM service Mobile [phone] Pager [phone]

## 2015-04-09 NOTE — Progress Notes (Signed)
CRRT discontinued per order. Both ports of catheter flushed with heparin 1.7 ml.

## 2015-04-09 NOTE — Care Management Note (Signed)
Case Management Note  Patient Details  Name: Joseph [name] MRN: [id number] Date of Birth: August 03, 1965  Subjective/Objective:   CRRT to stop today and HD begin                 Action/Plan:   Expected Discharge Date:  04/07/15               Expected Discharge Plan:     In-House Referral:     Discharge planning Services  CM Consult  Post Acute Care Choice:    Choice offered to:     DME Arranged:    DME Agency:     HH Arranged:    [place] Agency:     Status of Service:  In process, will continue to follow  Medicare Important Message Given:    Date Medicare IM Given:    Medicare IM give by:    Date Additional Medicare IM Given:    Additional Medicare Important Message give by:     If discussed at [place] of Stay Meetings, dates discussed:    Additional Comments:  Jolly Mango, RN 04/09/2015, 11:19 AM

## 2015-04-10 ENCOUNTER — Inpatient Hospital Stay: Payer: BLUE CROSS/BLUE SHIELD

## 2015-04-10 DIAGNOSIS — I959 Hypotension, unspecified: Secondary | ICD-10-CM

## 2015-04-10 DIAGNOSIS — N2889 Other specified disorders of kidney and ureter: Secondary | ICD-10-CM

## 2015-04-10 LAB — HEPATITIS B SURFACE ANTIBODY, QUANTITATIVE: Hepatitis B-Post: 54.6 m[IU]/mL (ref >9.9)

## 2015-04-10 LAB — CBC
HEMATOCRIT: 26.6 % — AB (ref 40.0–52.0)
Hemoglobin: 9.1 g/dL — ABNORMAL LOW (ref 13.0–18.0)
MCH: 29.6 pg (ref 26.0–34.0)
MCHC: 34.3 g/dL (ref 32.0–36.0)
MCV: 86.2 fL (ref 80.0–100.0)
PLATELETS: 92 10*3/uL — AB (ref 150–440)
RBC: 3.09 MIL/uL — AB (ref 4.40–5.90)
RDW: 14.3 % (ref 11.5–14.5)
WBC: 18.1 10*3/uL — AB (ref 3.8–10.6)

## 2015-04-10 LAB — PREPARE RBC (CROSSMATCH)

## 2015-04-10 LAB — HEMOGLOBIN AND HEMATOCRIT, BLOOD
HCT: 26.2 % — ABNORMAL LOW (ref 40.0–52.0)
HCT: 23.5 % — ABNORMAL LOW (ref 40.0–52.0)
HEMOGLOBIN: 8.8 g/dL — AB (ref 13.0–18.0)
Hemoglobin: 7.8 g/dL — ABNORMAL LOW (ref 13.0–18.0)

## 2015-04-10 LAB — PREPARE PLATELET PHERESIS: UNIT DIVISION: 0

## 2015-04-10 LAB — HEPATITIS C ANTIBODY: HCV Ab: <0.1 s/co ratio (ref 0.0–0.9)

## 2015-04-10 LAB — HEPATITIS B CORE ANTIBODY, TOTAL: HEP B C TOTAL AB: NEGATIVE

## 2015-04-10 LAB — RENAL FUNCTION PANEL
ALBUMIN: 2.6 g/dL — AB (ref 3.5–5.0)
Anion gap: 8 (ref 5–15)
BUN: 27 mg/dL — AB (ref 6–20)
CO2: 31 mmol/L (ref 22–32)
Calcium: 7.7 mg/dL — ABNORMAL LOW (ref 8.9–10.3)
Chloride: 100 mmol/L — ABNORMAL LOW (ref 101–111)
Creatinine, Ser: 2.89 mg/dL — ABNORMAL HIGH (ref 0.61–1.24)
GFR, EST AFRICAN AMERICAN: 27 mL/min — AB (ref >60)
GFR, EST NON AFRICAN AMERICAN: 24 mL/min — AB (ref >60)
Glucose, Bld: 112 mg/dL — ABNORMAL HIGH (ref 65–99)
POTASSIUM: 3.9 mmol/L (ref 3.5–5.1)
Phosphorus: 2.3 mg/dL — ABNORMAL LOW (ref 2.5–4.6)
Sodium: 139 mmol/L (ref 135–145)

## 2015-04-10 LAB — MAGNESIUM: Magnesium: 1.8 mg/dL (ref 1.7–2.4)

## 2015-04-10 LAB — HEPATITIS B SURFACE ANTIGEN: HEP B S AG: NEGATIVE

## 2015-04-10 MED ORDER — SODIUM CHLORIDE 0.9 % IV SOLN: Freq: Once | INTRAVENOUS | Status: DC

## 2015-04-10 MED ORDER — NALOXONE HCL 0.4 MG/ML IJ SOLN
0.4000 mg | INTRAMUSCULAR | Status: DC | PRN
Start: 2015-04-10 — End: 2015-04-12

## 2015-04-10 MED ORDER — HYDROMORPHONE HCL 1 MG/ML IJ SOLN
1.0000 mg | INTRAMUSCULAR | Status: AC
  Administered 2015-04-10: 1 mg via INTRAVENOUS

## 2015-04-10 MED ORDER — HYDRALAZINE HCL 20 MG/ML IJ SOLN
INTRAMUSCULAR | Status: AC
  Administered 2015-04-10: 10 mg via INTRAVENOUS
  Filled 2015-04-10: qty 1

## 2015-04-10 MED ORDER — VITAMIN K1 10 MG/ML IJ SOLN
10.0000 mg | Freq: Once | INTRAMUSCULAR | Status: AC
  Administered 2015-04-10: 10 mg via SUBCUTANEOUS
  Filled 2015-04-10: qty 1

## 2015-04-10 MED ORDER — SODIUM CHLORIDE 0.9 % IV SOLN
20.0000 ug | Freq: Two times a day (BID) | INTRAVENOUS | Status: AC
Start: 2015-04-10 — End: 2015-04-10
  Administered 2015-04-10 (×2): 20 ug via INTRAVENOUS
  Filled 2015-04-10 (×2): qty 5

## 2015-04-10 MED ORDER — HYDROMORPHONE 0.3 MG/ML IV SOLN
INTRAVENOUS | Status: DC
  Administered 2015-04-10: 9.67 mg via INTRAVENOUS
  Administered 2015-04-10: 0.3 mL via INTRAVENOUS
  Administered 2015-04-11 (×3): via INTRAVENOUS
  Administered 2015-04-12: 0.3 mg via INTRAVENOUS
  Filled 2015-04-10 (×4): qty 25

## 2015-04-10 MED ORDER — DIPHENHYDRAMINE HCL 50 MG/ML IJ SOLN: 12.5000 mg | Freq: Four times a day (QID) | INTRAMUSCULAR | Status: DC | PRN

## 2015-04-10 MED ORDER — METOPROLOL TARTRATE 1 MG/ML IV SOLN
INTRAVENOUS | Status: AC
  Filled 2015-04-10: qty 5

## 2015-04-10 MED ORDER — SODIUM CHLORIDE 0.9 % IV SOLN
Freq: Once | INTRAVENOUS | Status: AC
  Administered 2015-04-10: 11:00:00 via INTRAVENOUS

## 2015-04-10 MED ORDER — SODIUM CHLORIDE 0.9 % IJ SOLN: 9.0000 mL | INTRAMUSCULAR | Status: DC | PRN

## 2015-04-10 MED ORDER — DIPHENHYDRAMINE HCL 12.5 MG/5ML PO ELIX: 12.5000 mg | ORAL_SOLUTION | Freq: Four times a day (QID) | ORAL | Status: DC | PRN

## 2015-04-10 MED ORDER — HYDRALAZINE HCL 20 MG/ML IJ SOLN
10.0000 mg | Freq: Once | INTRAMUSCULAR | Status: AC
  Administered 2015-04-10: 10 mg via INTRAVENOUS

## 2015-04-10 NOTE — Progress Notes (Signed)
81 Dr. Delana Meyer updated on latest H&H . Opted to get repeat lab work  in 12 hours.

## 2015-04-10 NOTE — Progress Notes (Signed)
   04/10/15 0900  Clinical Encounter Type  Visited With Patient and family together  Visit Type Initial;Spiritual support  Referral From Nurse  Consult/Referral To Chaplain  Spiritual Encounters  Spiritual Needs Prayer  Stress Factors  Patient Stress Factors Exhausted;Health changes  Family Stress Factors Exhausted;Family relationships  Met w/patient & family. Provided pastoral care & prayer.  Chap. Anahy Esh G. [place]

## 2015-04-10 NOTE — Progress Notes (Signed)
[facility]  ROUNDING NOTE   Subjective:  Renal biopsy prelim results back. Has possible AA amyloidosis.   Would explain prolonged bleeding.  Developed severe pain again today.  CT scan abd/pelvis repeated.  Worsening hematoma noted.   Case discussed with pt and with vascular surgeon Dr. Delana Meyer.  May need to consider embolization of the left kidney.   Objective:  Vital signs in last 24 hours:  Temp:  [98.4 F (36.9 C)-99.9 F (37.7 C)] 98.9 F (37.2 C) (10/15 0700) Pulse Rate:  [83-111] 94 (10/15 0700) Resp:  [12-30] 19 (10/15 0700) BP: (112-174)/(74-94) 174/93 mmHg (10/15 0936) SpO2:  [87 %-100 %] 94 % (10/15 0700) Weight:  [97.1 kg (214 lb 1.1 oz)] 97.1 kg (214 lb 1.1 oz) (10/15 0424)  Weight change: -0.6 kg (-1 lb 5.2 oz) Filed Weights   04/08/15 0500 04/09/15 0427 04/10/15 0424  Weight: 96.8 kg (213 lb 6.5 oz) 97.7 kg (215 lb 6.2 oz) 97.1 kg (214 lb 1.1 oz)    Intake/Output: I/O last 3 completed shifts: In: 1113 [P.O.:300; Blood:813] Out: [id number] [Urine:1515]   Intake/Output this shift:     Physical Exam: General: Moderate distress from left flank pain  Head: Normocephalic, atraumatic. Moist oral mucosal membranes  Eyes: Anicteric, pallor noted  Neck: Supple, trachea midline  Lungs:  Clear to auscultation normal effort  Heart: Regular rate and rhythm no rubs  Abdomen:  Soft, left flank pain noted  Extremities: no peripheral edema.  Neurologic: Nonfocal, moving all four extremities  Skin: No lesions  Access: Right femoral temp HD catheter.    Basic Metabolic Panel:  Recent Labs Lab 04/08/15 0620 04/08/15 0952 04/08/15 1724 04/08/15 2231 04/09/15 0431 04/09/15 0432 04/10/15 0448  NA 138 140 140 140  --  142 139  K 4.0 4.6 4.9 4.6  --  4.5 3.9  CL 102 103 105 105  --  108 100*  CO2 29 31 31 30   --  30 31  GLUCOSE 116* 133* 125* 119*  --  119* 112*  BUN 36* 33* 29* 30*  --  30* 27*  CREATININE 3.28* 3.07* 2.87* 2.91*  --  2.94* 2.89*   CALCIUM 7.9* 8.2* 8.2* 8.4*  --  8.3* 7.7*  MG 1.6* 1.6* 1.7  --  1.7  --  1.8  PHOS 3.9 3.5 3.2 2.6  --  2.6 2.3*    Liver Function Tests:  Recent Labs Lab 04/05/15 [id number] 04/07/15 0439  04/08/15 0952 04/08/15 1724 04/08/15 2231 04/09/15 0432 04/10/15 0448  AST 20 223*  --   --   --   --   --   --   ALT 29 380*  --   --   --   --   --   --   ALKPHOS 129* 92  --   --   --   --   --   --   BILITOT 0.5 0.4  --   --   --   --   --   --   PROT 7.5 6.2*  --   --   --   --   --   --   ALBUMIN 4.1 3.4*  < > 2.8* 3.0* 3.0* 2.9* 2.6*  < > = values in this interval not displayed. No results for input(s): LIPASE, AMYLASE in the last 168 hours. No results for input(s): AMMONIA in the last 168 hours.  CBC:  Recent Labs Lab 04/05/15 0838  04/08/15 1724 04/08/15 2231 04/09/15 0431  04/09/15 1607 04/10/15 0448  WBC 9.4  < > 17.5* 17.3* 17.4* 12.5* 18.1*  NEUTROABS 5.6  --   --   --   --   --   --   HGB 10.2*  < > 7.7* 7.6* 7.5* 6.7* 9.1*  HCT 32.0*  < > 23.3* 23.4* 22.7* 20.6* 26.6*  MCV 86.6  < > 85.3 85.1 86.2 85.6 86.2  PLT 246  < > 65* 64* 57* 89* 92*  < > = values in this interval not displayed.  Cardiac Enzymes: No results for input(s): CKTOTAL, CKMB, CKMBINDEX, TROPONINI in the last 168 hours.  BNP: Invalid input(s): POCBNP  CBG:  Recent Labs Lab 04/08/15 2333 04/09/15 0411 04/09/15 0745 04/09/15 1152 04/09/15 1617  GLUCAP 103* 110* 110* 164* 113*    Microbiology: Results for orders placed or performed during the hospital encounter of 04/06/15  MRSA PCR Screening     Status: None   Collection Time: 04/07/15  9:49 AM  Result Value Ref Range Status   MRSA by PCR NEGATIVE NEGATIVE Final    Comment:        The GeneXpert MRSA Assay (FDA approved for NASAL specimens only), is one component of a comprehensive MRSA colonization surveillance program. It is not intended to diagnose MRSA infection nor to guide or monitor treatment for MRSA infections.      Coagulation Studies:  Recent Labs  04/07/15 1055 04/09/15 0431  LABPROT 16.8* 18.7*  INR 1.34 1.54    Urinalysis: No results for input(s): COLORURINE, LABSPEC, PHURINE, GLUCOSEU, HGBUR, BILIRUBINUR, KETONESUR, PROTEINUR, UROBILINOGEN, NITRITE, LEUKOCYTESUR in the last 72 hours.  Invalid input(s): APPERANCEUR    Imaging: Ct Abdomen Pelvis Wo Contrast  04/10/2015  CLINICAL DATA:  49 year old male with acute renal failure superimposed on chronic kidney disease. Status post recent renal biopsy complicated by subcapsular hematoma. Acute onset of diaphoresis and intense left-sided abdominal pain 1 hour ago. EXAM: CT ABDOMEN AND PELVIS WITHOUT CONTRAST TECHNIQUE: Multidetector CT imaging of the abdomen and pelvis was performed following the standard protocol without IV contrast. COMPARISON:  CT the abdomen and pelvis without contrast 04/07/2015. FINDINGS: Lower chest: Small right and moderate left pleural effusions. The left pleural effusion is associated with complete atelectasis of the visualized portions of the left lower lobe. There are patchy areas of ground-glass attenuation in the right lung base, most evident in the right lower lobe, concerning for either developing infection, hemorrhage or sequela of recent aspiration. Hepatobiliary: No definite cystic or solid hepatic lesions are identified within the liver on today's noncontrast CT examination. Unenhanced appearance of the gallbladder is normal. Pancreas: No pancreatic mass or definite pancreatic/ peripancreatic fluid collections identified on today's noncontrast CT examination. Spleen: Unremarkable. Adrenals/Urinary Tract: Significant interval enlargement of the previously noted subcapsular hematoma, which now appears to be extending into the perinephric soft tissues. This currently measures up to 5.1 cm in thickness (previously 1.8 cm), and is associated with increasing compression of the left renal parenchyma. Other than an exophytic  a low-attenuation lesion in the upper pole of the right kidney (which is incompletely characterized, but likely a cyst), the right kidney normal in appearance on today's noncontrast CT examination. Right adrenal gland is normal in appearance. Left adrenal gland appears enlarged, and is high attenuation, suggesting recent adrenal hemorrhage. No hydroureteronephrosis. Urinary bladder is nearly completely decompressed with a Foley balloon catheter in place. Stomach/Bowel: Unenhanced appearance of the stomach is normal. No pathologic dilatation of small bowel or colon. Vascular/Lymphatic: Atherosclerotic calcifications throughout the  abdominal and pelvic vasculature, without definite aneurysm. Arm catheter injuring the right femoral vein extending into the IVC (terminating below the level of the renal veins). Numerous borderline enlarged and mildly enlarged retroperitoneal lymph nodes, largest of which is in the left para-aortic nodal station adjacent to the left renal hilum, likely reactive. Reproductive: Prostate gland and seminal vesicles are unremarkable in appearance. Other: Small volume of intermediate attenuation ascites, likely with proteinaceous or hemorrhagic contents. No pneumoperitoneum. Musculoskeletal: There are no aggressive appearing lytic or blastic lesions noted in the visualized portions of the skeleton. IMPRESSION: 1. Enlarging left renal subcapsular hematoma common now with extension into the perinephric soft tissues of the left retroperitoneum, and increasing compression of the left renal parenchyma. Clinical correlation for signs and symptoms of worsening renovascular hypertension is suggested. 2. Associated with this, there is now enlargement and high attenuation in the left adrenal gland, presumably from spontaneous adrenal hemorrhage. 3. Moderate left pleural effusion associated with complete passive atelectasis of the visualized portions of the left lower lobe. In addition, there appears to be  extensive airspace consolidation in the right lung base (most severe in the right lower lobe), which could reflect developing infection, areas of alveolar hemorrhage, or sequela of aspiration. 4. Additional incidental findings, as above. These results will be called to the ordering clinician or representative by the Radiologist Assistant, and communication documented in the PACS or zVision Dashboard. Electronically Signed   By: Vinnie Langton M.D.   On: 04/10/2015 09:40     Medications:   . pureflow 15,000 mL (04/08/15 1600)   . sodium chloride   Intravenous Once  . antiseptic oral rinse  7 mL Mouth Rinse BID  . desmopressin (DDAVP) IV  20 mcg Intravenous BID  . Influenza vac split quadrivalent PF  0.5 mL Intramuscular Tomorrow-1000  . pantoprazole (PROTONIX) IV  40 mg Intravenous Q12H  . phytonadione  10 mg Subcutaneous Once   sodium chloride, sodium chloride, acetaminophen, alteplase, heparin, heparin, HYDROmorphone (DILAUDID) injection, lidocaine (PF), lidocaine-prilocaine, ondansetron (ZOFRAN) IV, oxyCODONE-acetaminophen, pentafluoroprop-tetrafluoroeth  Assessment/ Plan:  49 y.o. male with past medical history of hypertension, hyperlipidemia, anemia chronic kidney disease, peptic ulcer disease, CKD stage IV, anemia of CKD proteinuria, hyperkalemia s/p renal bioy 04/06/15 with subsequent left perinephric hematoma.  1.  Acute renal failure/CKD stage IV/Proteinuria:  Pt s/p left renal biopsy. Perinephric hematoma developed which was initially subcapsular, subsequently extracapsular bleeding noted.  Possible AA amyloid noted on prelim renal biopsy, but official final report still pending. -UOP actually increased over the past 24 hours, had HD yesterday.  Will hold off on HD today for now.  Continue to monitor electrolytes and UOP closely.  If embolization of the left kidney occurs he will likely progress to ESRD.   2.  Left perinephric hematoma:  Noted post biopsy, was initially subcapsular,  now extracapsular.   -has developed acute left flank pain, worsening of hematoma noted, along with possible adrenal gland hemorrhage.  Discussed in depth with pt and Dr. Delana Meyer. May need to have left kidney embolization.  We will follow his progress closely and monitor h/h closely.  3.  Post hemorrhagic anemia:  hgb has gone from 9.1 to 8.8 this AM, will follow h/h closely.  If hgb drops to 7 there is high probability we will proceed with embolization.  4.  Hyperkalemia:  Resolved with CRRT.       LOS: 3 Bonniejean Piano 10/15/20169:55 AM

## 2015-04-10 NOTE — Progress Notes (Signed)
Temporary duo lumen vas.cath. In right groin

## 2015-04-10 NOTE — Progress Notes (Signed)
[facility]  Daily Progress Note   Subjective  - s/p renal biopsy on 04/06/2015 The patient developed subcapsular hematoma following the biopsy. Thus far he has been successfully managed conservatively. For the past 24 hours the patient has developed recurrence of the left flank pain this is led to a follow-up CT scan without contrast which demonstrates an increase in the size of the subcapsular hematoma but there is now marked heterogeneity consistent with a organizing hematoma. His vital signs of been otherwise stable throughout. Thursday his hemoglobin was noted to be 7 and yesterday it was 6 he received a transfusion and this morning his hemoglobin is approximately 9.  Objective Filed Vitals:   04/10/15 1415 04/10/15 1430 04/10/15 1445 04/10/15 1500  BP: 127/90 133/101 134/103 122/95  Pulse: 99 102 105 101  Temp:      TempSrc:      Resp: 17 20  10   Height:      Weight:      SpO2: 100% 100%  100%    Intake/Output Summary (Last 24 hours) at 04/10/15 1528 Last data filed at 04/10/15 1200  Gross per 24 hour  Intake    615 ml  Output   1351 ml  Net   -736 ml    PULM  Normal effort , no use of accessory muscles CV  No JVD, RRR Abd      No distended, tender to palpation particularly on the left VASC  the patient has IVs in both wrists. There is a double lumen dialysis catheter in the right femoral position.  Laboratory CBC    Component Value Date/Time   WBC 18.1* 04/10/2015 0448   WBC 8.1 04/06/2014 0754   HGB 8.8* 04/10/2015 0944   HGB 9.9* 04/06/2014 0754   HCT 26.2* 04/10/2015 0944   HCT 30.7* 04/06/2014 0754   PLT 92* 04/10/2015 0448   PLT 273 04/06/2014 0754    BMET    Component Value Date/Time   NA 139 04/10/2015 0448   NA 141 04/06/2014 0754   K 3.9 04/10/2015 0448   K 5.2* 04/06/2014 0754   CL 100* 04/10/2015 0448   CL 111* 04/06/2014 0754   CO2 31 04/10/2015 0448   CO2 24 04/06/2014 0754   GLUCOSE 112* 04/10/2015 0448   GLUCOSE  104* 04/06/2014 0754   BUN 27* 04/10/2015 0448   BUN 39* 04/06/2014 0754   CREATININE 2.89* 04/10/2015 0448   CREATININE 2.76* 04/06/2014 0754   CALCIUM 7.7* 04/10/2015 0448   CALCIUM 8.4* 04/06/2014 0754   GFRNONAA 24* 04/10/2015 0448   GFRNONAA 26* 04/06/2014 0754   GFRAA 27* 04/10/2015 0448   GFRAA 32* 04/06/2014 0754    Assessment/Planning:  1.  Left perinephric hematoma: Noted post biopsy, was initially subcapsular, now extracapsular.  -has developed acute left flank pain, enlargement of hematoma noted, also noted is heterogeneity within the hematoma consistent with organization. This secondary episode of pain as occurring at a time when secondary expansion is frequently seen secondary to the degradation of red blood cells leading to the release of the electrolytes and his osmotic expansion causing the pain. At this time his change in blood count is marginal his vital signs of been stable and I do not believe that he is suffering from a significant acute blood loss. Given the fact that his original creatinine clearance was 25 Rushing to embolize his kidney would most certainly commit him to dialysis for the rest of his life whereas with conservative therapies  if he is able to resolve these issues with a few transfusions this creatinine clearance may return back near 25 and he may be off dialysis for many years. For now I will follow his hemoglobins I've discussed this in great detail the patient is aware the risks of dialysis with embolization. The length of discussion with the patient and family as well as the coordination of care is 50 minutes   2.Acute renal failure/CKD stage IV/Proteinuria: Pt s/p left renal biopsy. Perinephric hematoma developed which was initially subcapsular, subsequently extracapsular bleeding noted. Possible AA amyloid noted on prelim renal biopsy, but official final report still pending. -UOP actually increased over the past 24 hours, had HD yesterday. Will  hold off on HD today for now. Continue to monitor electrolytes and UOP closely. If embolization of the left kidney occurs he will likely progress to ESRD.   3. Post hemorrhagic anemia: hgb has gone from 9.1 to 8.8 this AM, will follow h/h closely.At the present time I'm not convinced that he is dramatically rebleeding. If hgb drops to below 7 there is high probability we will proceed with embolization.  4. Hyperkalemia: Resolved with CRRT    Katha Cabal  04/10/2015, 3:28 PM

## 2015-04-10 NOTE — Progress Notes (Signed)
1000am Continues in severe pain- labs drawn. Dr Holley Raring explaining stat CT results to family. Orders received for PCA dilaudid to help alleviate pain. 1032am PCA Dilaudid set up.Patient instructed on how to operate PCA. ETCO2 monitor also set up and explained to patient. 1100am Patient finally experiencing some pain relief.

## 2015-04-10 NOTE — Progress Notes (Addendum)
[facility] NAME: Joseph [name]    MR#:  [id number]  DATE OF BIRTH:  12-14-1965  SUBJECTIVE:  CHIEF COMPLAINT:  No chief complaint on file.  worsening back pain and abdominal pain this morning REVIEW OF SYSTEMS:  CONSTITUTIONAL: No fever, no weakness.  EYES: No blurred or double vision.  EARS, NOSE, AND THROAT: No tinnitus or ear pain.  RESPIRATORY: No cough, shortness of breath, wheezing or hemoptysis.  CARDIOVASCULAR: No chest pain, orthopnea, edema.  GASTROINTESTINAL: has nausea, no vomiting, diarrhea, but has abdominal pain.  GENITOURINARY: No dysuria, hematuria.  ENDOCRINE: No polyuria, nocturia,  HEMATOLOGY: No anemia, easy bruising or bleeding SKIN: No rash or lesion. MUSCULOSKELETAL: Worsening back pain.   NEUROLOGIC: No tingling, numbness, weakness.  PSYCHIATRY: No anxiety or depression.   DRUG ALLERGIES:   Allergies  Allergen Reactions  . Ciprofloxacin Hcl   . Doxycycline Hyclate     VITALS:  Blood pressure 147/92, pulse 100, temperature 98.9 F (37.2 C), temperature source Oral, resp. rate 22, height 5\' 11"  (1.803 m), weight 97.1 kg (214 lb 1.1 oz), SpO2 98 %.  PHYSICAL EXAMINATION:  GENERAL:  49 y.o.-year-old patient lying in the bed with no acute distress.  EYES: Pupils equal, round, reactive to light and accommodation. No scleral icterus. Extraocular muscles intact.  HEENT: Head atraumatic, normocephalic. Oropharynx and nasopharynx clear. Moist oral mucosa. NECK:  Supple, no jugular venous distention. No thyroid enlargement, no tenderness.  LUNGS: Normal breath sounds bilaterally, no wheezing, rales,rhonchi or crepitation. No use of accessory muscles of respiration.  CARDIOVASCULAR: S1, S2 normal. No murmurs, rubs, or gallops.  ABDOMEN: Soft, nontender, nondistended. Bowel sounds present. No organomegaly or mass.  EXTREMITIES: No pedal edema, cyanosis, or clubbing.  NEUROLOGIC: Cranial nerves II  through XII are intact. Muscle strength 4/5 in all extremities. Sensation intact. Gait not checked.  PSYCHIATRIC: The patient is alert and oriented x 3.  SKIN: No obvious rash, lesion, or ulcer.    LABORATORY PANEL:   CBC  Recent Labs Lab 04/10/15 0448 04/10/15 0944  WBC 18.1*  --   HGB 9.1* 8.8*  HCT 26.6* 26.2*  PLT 92*  --    ------------------------------------------------------------------------------------------------------------------  Chemistries   Recent Labs Lab 04/07/15 0439  04/10/15 0448  NA 144  < > 139  K 7.4*  < > 3.9  CL 119*  < > 100*  CO2 13*  < > 31  GLUCOSE 161*  < > 112*  BUN 59*  < > 27*  CREATININE 4.86*  < > 2.89*  CALCIUM 8.2*  < > 7.7*  MG 1.6*  < > 1.8  AST 223*  --   --   ALT 380*  --   --   ALKPHOS 92  --   --   BILITOT 0.4  --   --   < > = values in this interval not displayed. ------------------------------------------------------------------------------------------------------------------  Cardiac Enzymes No results for input(s): TROPONINI in the last 168 hours. ------------------------------------------------------------------------------------------------------------------  RADIOLOGY:  Ct Abdomen Pelvis Wo Contrast  04/10/2015  CLINICAL DATA:  49 year old male with acute renal failure superimposed on chronic kidney disease. Status post recent renal biopsy complicated by subcapsular hematoma. Acute onset of diaphoresis and intense left-sided abdominal pain 1 hour ago. EXAM: CT ABDOMEN AND PELVIS WITHOUT CONTRAST TECHNIQUE: Multidetector CT imaging of the abdomen and pelvis was performed following the standard protocol without IV contrast. COMPARISON:  CT the abdomen and pelvis without contrast 04/07/2015. FINDINGS: Lower chest:  Small right and moderate left pleural effusions. The left pleural effusion is associated with complete atelectasis of the visualized portions of the left lower lobe. There are patchy areas of ground-glass  attenuation in the right lung base, most evident in the right lower lobe, concerning for either developing infection, hemorrhage or sequela of recent aspiration. Hepatobiliary: No definite cystic or solid hepatic lesions are identified within the liver on today's noncontrast CT examination. Unenhanced appearance of the gallbladder is normal. Pancreas: No pancreatic mass or definite pancreatic/ peripancreatic fluid collections identified on today's noncontrast CT examination. Spleen: Unremarkable. Adrenals/Urinary Tract: Significant interval enlargement of the previously noted subcapsular hematoma, which now appears to be extending into the perinephric soft tissues. This currently measures up to 5.1 cm in thickness (previously 1.8 cm), and is associated with increasing compression of the left renal parenchyma. Other than an exophytic a low-attenuation lesion in the upper pole of the right kidney (which is incompletely characterized, but likely a cyst), the right kidney normal in appearance on today's noncontrast CT examination. Right adrenal gland is normal in appearance. Left adrenal gland appears enlarged, and is high attenuation, suggesting recent adrenal hemorrhage. No hydroureteronephrosis. Urinary bladder is nearly completely decompressed with a Foley balloon catheter in place. Stomach/Bowel: Unenhanced appearance of the stomach is normal. No pathologic dilatation of small bowel or colon. Vascular/Lymphatic: Atherosclerotic calcifications throughout the abdominal and pelvic vasculature, without definite aneurysm. Arm catheter injuring the right femoral vein extending into the IVC (terminating below the level of the renal veins). Numerous borderline enlarged and mildly enlarged retroperitoneal lymph nodes, largest of which is in the left para-aortic nodal station adjacent to the left renal hilum, likely reactive. Reproductive: Prostate gland and seminal vesicles are unremarkable in appearance. Other: Small  volume of intermediate attenuation ascites, likely with proteinaceous or hemorrhagic contents. No pneumoperitoneum. Musculoskeletal: There are no aggressive appearing lytic or blastic lesions noted in the visualized portions of the skeleton. IMPRESSION: 1. Enlarging left renal subcapsular hematoma common now with extension into the perinephric soft tissues of the left retroperitoneum, and increasing compression of the left renal parenchyma. Clinical correlation for signs and symptoms of worsening renovascular hypertension is suggested. 2. Associated with this, there is now enlargement and high attenuation in the left adrenal gland, presumably from spontaneous adrenal hemorrhage. 3. Moderate left pleural effusion associated with complete passive atelectasis of the visualized portions of the left lower lobe. In addition, there appears to be extensive airspace consolidation in the right lung base (most severe in the right lower lobe), which could reflect developing infection, areas of alveolar hemorrhage, or sequela of aspiration. 4. Additional incidental findings, as above. These results will be called to the ordering clinician or representative by the Radiologist Assistant, and communication documented in the PACS or zVision Dashboard. Electronically Signed   By: Vinnie Langton M.D.   On: 04/10/2015 09:40    EKG:   Orders placed or performed in visit on 04/06/14  . EKG 12-Lead    ASSESSMENT AND PLAN:   1. Acute renal failure/CKD stage IV with left scapsular hematoma after renal biopsy.  Perinephric hematoma developed which was initially subcapsular, later with extracapsular bleeding.He got CRRT for the past 3 days and need HD. Per Dr. Holley Raring , Will hold off on HD today for now. Continue to monitor electrolytes and UOP closely. If embolization of the left kidney occurs he will likely progress to ESRD.   *  left scapsular hematoma after renal biopsy.  Per CT abd today, worsening of hematoma,  along  with possible adrenal gland hemorrhage. Dr. Holley Raring discussed in depth with pt and Dr. Delana Meyer. May need to have left kidney embolization. Per Dr. Delana Meyer, At the present time I'm not convinced that he is dramatically rebleeding. If hgb drops to below 7 there is high probability we will proceed with embolization.  * Hyperkalemia. Improved.  2.  Hpotension: improved.  3.Acute blood loss anemia due to 1. Hb increased from 6.6 to 7.5 after 2 unit PRBC transfusion. Hb down to 6.7 yesterday pm, given 2 unit PRBC, Hb 9.1 this am and repeat Hb 8.8. Follow-up Hb q6h. Now Hb is 7.8.  4. Hypomagnesemia. Mag supplement. Improved.  * Leukocytosis. Possible due to hematoma. F/u CBC.  * Thrombocytopenia. Worsening, f/u CBC. He got platelet transfusion and vitamin K. We will give 20 mg of DDAVP IV twice a day today per Dr. Isidore Moos. follow-up CBC.  I discussed with Dr. Holley Raring and Dr. Isidore Moos.  All the records are reviewed and case discussed with Care Management/Social Workerr. Management plans discussed with the patient, his wife and mother, and they are in agreement.  CODE STATUS: Full code.  TOTAL CRITICAL TIME TAKING CARE OF THIS PATIENT: 57 minutes.   POSSIBLE D/C IN >3 DAYS, DEPENDING ON CLINICAL CONDITION.   Demetrios Loll M.D on 04/10/2015 at 11:27 AM  Between 7am to 6pm - Pager - [phone]  After 6pm go to [email or web address] - password [email or web address] [facility]  Office  [phone]  CC: Primary care physician; Pcp Not In System

## 2015-04-10 NOTE — Progress Notes (Signed)
0803am complains of left flank pain. Biopsy site( left flank clean,dry and intact)  Remains clean and dry. Pain level of 4.   Given 1 po roxicet for pain. 0828am States pain level has skyrocketed to 10+. Anxious, restless and very diaphorectic. Given 1mg  iv Dilaudid. Remained with patient. 0830 BP165/99-100-32. Panting with pain. Up to [facility] with help. Had hard bm. Green in color. No old blood or new blood noted in  Stool .Dr. Juanell Fairly and Dr Holley Raring paged. 59 Dr. Juanell Fairly assessing patient at bedside. Patient writhing in pain. Unable to communicate with anyone.  0847Stat 1 mg of dilaudid given per Claris Gower RN.  [phone] Dr. Holley Raring in to see patient 0900 Stat CT of abd with contrast ordered. [id number] ingesting oral contrast for CT. 0920 Remains in severe pain of 10+-down to CT via bed. 0930 returns to room. Dr. Holley Raring and Dr. Juanell Fairly on floor checking on patient.

## 2015-04-10 NOTE — Progress Notes (Signed)
Dr. Holley Raring notified of Ct of abd and pelvis results he is currently speaking with Dr. Delana Meyer about plan of care

## 2015-04-10 NOTE — Progress Notes (Signed)
PULMONARY / CRITICAL CARE MEDICINE   Name: Sawyer Sutherby MRN: [id number] DOB: [birth year]    ADMISSION DATE:  04/06/2015  INITIAL PRESENTATION:  1 M underwent elective L kidney bx 10/10 c/b severe hemorrhage and hemorrhagic shock  MAJOR EVENTS/TEST RESULTS: 10/11 CTAP: Moderately large left subcapsular hematoma with mild compression of the left renal parenchyma 10/12 CTAP: Moderately large subcapsular hematoma around the left kidney appears about the same as yesterday. Hemorrhagic stranding in the left perirenal fat is about the same. There is increasing hemorrhage in the retroperitoneum, celiac axis, gastrohepatic ligament, posterior subdiaphragmatic space, and with development of free fluid, likely intraperitoneal hemorrhage around the liver and extending into the pelvis. Enlarged retroperitoneal lymph nodes similar to previous study. New left pleural effusion with basilar atelectasis 10/12 CRRT initiated  INDWELLING DEVICES:: R femoral HS cath 10/11 >>    MICRO DATA:   ANTIMICROBIALS:    SUBJECTIVE:  No new complaints. Still with mild flank pain and abd pain   VITAL SIGNS: Temp:  [98.4 F (36.9 C)-99.9 F (37.7 C)] 98.9 F (37.2 C) (10/15 0700) Pulse Rate:  [83-111] 94 (10/15 0700) Resp:  [12-30] 19 (10/15 0700) BP: (112-151)/(74-94) 140/85 mmHg (10/15 0700) SpO2:  [87 %-100 %] 94 % (10/15 0700) Weight:  [97.1 kg (214 lb 1.1 oz)] 97.1 kg (214 lb 1.1 oz) (10/15 0424) HEMODYNAMICS:   VENTILATOR SETTINGS:   INTAKE / OUTPUT:  Intake/Output Summary (Last 24 hours) at 04/10/15 0915 Last data filed at 04/10/15 0516  Gross per 24 hour  Intake   1053 ml  Output   1050 ml  Net      3 ml    PHYSICAL EXAMINATION: General: WDWN, NAD Neuro: No focal deficits HEENT: NCAT, WNL Cardiovascular: mildly tachy, reg, no M Lungs: clear anteriorly Abdomen: nondistended, mildy tender on L Ext: no edema  LABS:  CBC  Recent Labs Lab 04/09/15 0431 04/09/15 1607  04/10/15 0448  WBC 17.4* 12.5* 18.1*  HGB 7.5* 6.7* 9.1*  HCT 22.7* 20.6* 26.6*  PLT 57* 89* 92*   Coag's  Recent Labs Lab 04/07/15 0439 04/07/15 1055 04/08/15 0620 04/09/15 0431  APTT  --  28 37* 43*  INR 1.28 1.34  --  1.54   BMET  Recent Labs Lab 04/08/15 2231 04/09/15 0432 04/10/15 0448  NA 140 142 139  K 4.6 4.5 3.9  CL 105 108 100*  CO2 30 30 31   BUN 30* 30* 27*  CREATININE 2.91* 2.94* 2.89*  GLUCOSE 119* 119* 112*   Electrolytes  Recent Labs Lab 04/08/15 1724 04/08/15 2231 04/09/15 0431 04/09/15 0432 04/10/15 0448  CALCIUM 8.2* 8.4*  --  8.3* 7.7*  MG 1.7  --  1.7  --  1.8  PHOS 3.2 2.6  --  2.6 2.3*   Sepsis Markers No results for input(s): LATICACIDVEN, PROCALCITON, O2SATVEN in the last 168 hours. ABG No results for input(s): PHART, PCO2ART, PO2ART in the last 168 hours. Liver Enzymes  Recent Labs Lab 04/05/15 0838 04/07/15 0439  04/08/15 2231 04/09/15 0432 04/10/15 0448  AST 20 223*  --   --   --   --   ALT 29 380*  --   --   --   --   ALKPHOS 129* 92  --   --   --   --   BILITOT 0.5 0.4  --   --   --   --   ALBUMIN 4.1 3.4*  < > 3.0* 2.9* 2.6*  < > = values in  this interval not displayed. Cardiac Enzymes No results for input(s): TROPONINI, PROBNP in the last 168 hours. Glucose  Recent Labs Lab 04/08/15 1948 04/08/15 2333 04/09/15 0411 04/09/15 0745 04/09/15 1152 04/09/15 1617  GLUCAP 125* 103* 110* 110* 164* 113*    CXR: no film   ASSESSMENT / PLAN: CARDIOVASCULAR A:  Hemorrhagic shock, resolved P:  Support with volume and blood products  RENAL A:   CKD - etiology unclear AKI due to shock P:   Monitor BMET intermittently Monitor I/Os Correct electrolytes as indicated CRRT through today per renal  GASTROINTESTINAL A:  Abd pain de to intra-abd hemorrhage--with severe left flank pain this am.   P:   NPO; stat CT abdomen/pelvis    HEMATOLOGIC A:   Acute blood loss anemia-- Thrombocytopenia   Consumptive/dilutional coagulopathy P:  Transfuse PRBCs for shock and to maintain Hgb > 8.0 --Will transfuse platelets, give Vitamin K.  --We'll give 20 mg of DDAVP IV twice a day today. -Discussed with nephrology, the patient has received multiple blood products with continually recurrent anemia and again developed acute abdominal pain today. Review of the CT scan compared with previous shows increased subcapsular hematoma which is likely the cause of the patient's pain. We will be consulting for possible embolization.  INFECTIOUS A:   No issues P:   Monitor temp, WBC count Micro and abx as above  ENDOCRINE A:   Mild stress induced hyperglycemia P:   Monitor glu on chem panels Consider SSI for glu > 180  NEUROLOGIC A:   Pain P:   Cont PRN dilaudid  Deep Ashby Dawes, M.D.  PCCM service  Pager [phone]  04/10/2015, 9:15 AM  Critical Care Attestation.  I have personally obtained a history, examined the patient, evaluated laboratory and imaging results, formulated the assessment and plan and placed orders. Case was discussed with nephrologist, critical care RN, patient, and patient's family at bedside. The Patient requires high complexity decision making for assessment and support, frequent evaluation and titration of therapies, application of advanced monitoring technologies and extensive interpretation of multiple databases. The patient has critical illness that could lead imminently to failure of 1 or more organ systems and requires the highest level of physician preparedness to intervene.  Critical Care Time devoted to patient care services described in this note is 35 minutes and is exclusive of time spent in procedures.

## 2015-04-11 ENCOUNTER — Encounter
Admission: AD | Disposition: A | Discharge status: Home / Self Care | Payer: Self-pay | Source: Ambulatory Visit | Attending: Internal Medicine

## 2015-04-11 HISTORY — PX: PERIPHERAL VASCULAR CATHETERIZATION: SHX172C

## 2015-04-11 HISTORY — PX: AORTOGRAM: SHX6300

## 2015-04-11 LAB — TYPE AND SCREEN
ABO/RH(D): O POS
ANTIBODY SCREEN: NEGATIVE
UNIT DIVISION: 0
UNIT DIVISION: 0
Unit division: 0
Unit division: 0
Unit division: 0
Unit division: 0

## 2015-04-11 LAB — MAGNESIUM: Magnesium: 1.9 mg/dL (ref 1.7–2.4)

## 2015-04-11 LAB — HEMOGLOBIN AND HEMATOCRIT, BLOOD
HCT: 22.6 % — ABNORMAL LOW (ref 40.0–52.0)
HCT: 21.6 % — ABNORMAL LOW (ref 40.0–52.0)
HEMOGLOBIN: 7.5 g/dL — AB (ref 13.0–18.0)
Hemoglobin: 7.3 g/dL — ABNORMAL LOW (ref 13.0–18.0)

## 2015-04-11 LAB — CBC
HEMATOCRIT: 20.1 % — AB (ref 40.0–52.0)
Hemoglobin: 6.8 g/dL — ABNORMAL LOW (ref 13.0–18.0)
MCH: 29.2 pg (ref 26.0–34.0)
MCHC: 34 g/dL (ref 32.0–36.0)
MCV: 85.9 fL (ref 80.0–100.0)
Platelets: 158 10*3/uL (ref 150–440)
RBC: 2.34 MIL/uL — AB (ref 4.40–5.90)
RDW: 14.6 % — ABNORMAL HIGH (ref 11.5–14.5)
WBC: 17.3 10*3/uL — AB (ref 3.8–10.6)

## 2015-04-11 LAB — RENAL FUNCTION PANEL
ANION GAP: 9 (ref 5–15)
Albumin: 2.6 g/dL — ABNORMAL LOW (ref 3.5–5.0)
BUN: 54 mg/dL — ABNORMAL HIGH (ref 6–20)
CHLORIDE: 103 mmol/L (ref 101–111)
CO2: 30 mmol/L (ref 22–32)
CREATININE: 4.68 mg/dL — AB (ref 0.61–1.24)
Calcium: 7.5 mg/dL — ABNORMAL LOW (ref 8.9–10.3)
GFR calc non Af Amer: 13 mL/min — ABNORMAL LOW (ref >60)
GFR, EST AFRICAN AMERICAN: 15 mL/min — AB (ref >60)
Glucose, Bld: 122 mg/dL — ABNORMAL HIGH (ref 65–99)
Phosphorus: 4.8 mg/dL — ABNORMAL HIGH (ref 2.5–4.6)
Potassium: 4.2 mmol/L (ref 3.5–5.1)
Sodium: 142 mmol/L (ref 135–145)

## 2015-04-11 LAB — PREPARE RBC (CROSSMATCH)

## 2015-04-11 LAB — PROTIME-INR
INR: 1.3
Prothrombin Time: 16.4 seconds — ABNORMAL HIGH (ref 11.4–15.0)

## 2015-04-11 SURGERY — EMBOLIZATION
Anesthesia: Moderate Sedation | Laterality: Left

## 2015-04-11 MED ORDER — DEXTROSE 5 % IV SOLN
INTRAVENOUS | Status: AC
  Filled 2015-04-11: qty 1.5

## 2015-04-11 MED ORDER — SODIUM CHLORIDE 0.9 % IV SOLN
Freq: Once | INTRAVENOUS | Status: AC
  Administered 2015-04-11: 07:00:00 via INTRAVENOUS

## 2015-04-11 MED ORDER — HEPARIN (PORCINE) IN NACL 2-0.9 UNIT/ML-% IJ SOLN
INTRAMUSCULAR | Status: AC
  Filled 2015-04-11: qty 1000

## 2015-04-11 MED ORDER — CEFAZOLIN SODIUM 1-5 GM-% IV SOLN
1.0000 g | INTRAVENOUS | Status: AC
  Filled 2015-04-11: qty 50

## 2015-04-11 MED ORDER — IOHEXOL 300 MG/ML  SOLN
INTRAMUSCULAR | Status: DC | PRN
  Administered 2015-04-11: 50 mL via INTRAVENOUS

## 2015-04-11 MED ORDER — MIDAZOLAM HCL 2 MG/2ML IJ SOLN
INTRAMUSCULAR | Status: DC | PRN
  Administered 2015-04-11 (×2): 1 mg via INTRAVENOUS

## 2015-04-11 MED ORDER — FENTANYL CITRATE (PF) 100 MCG/2ML IJ SOLN
INTRAMUSCULAR | Status: DC | PRN
  Administered 2015-04-11 (×2): 25 ug via INTRAVENOUS

## 2015-04-11 MED ORDER — HEPARIN SODIUM (PORCINE) 1000 UNIT/ML IJ SOLN
INTRAMUSCULAR | Status: AC
  Filled 2015-04-11: qty 1

## 2015-04-11 MED ORDER — MIDAZOLAM HCL 5 MG/5ML IJ SOLN
INTRAMUSCULAR | Status: AC
  Filled 2015-04-11: qty 5

## 2015-04-11 MED ORDER — LIDOCAINE HCL (PF) 1 % IJ SOLN
INTRAMUSCULAR | Status: AC
  Filled 2015-04-11: qty 10

## 2015-04-11 MED ORDER — DEXTROSE 5 % IV SOLN
1.5000 g | Freq: Once | INTRAVENOUS | Status: AC
  Administered 2015-04-11: 1.5 g via INTRAVENOUS
  Filled 2015-04-11: qty 1.5

## 2015-04-11 MED ORDER — FENTANYL CITRATE (PF) 100 MCG/2ML IJ SOLN
INTRAMUSCULAR | Status: AC
  Filled 2015-04-11: qty 2

## 2015-04-11 SURGICAL SUPPLY — 18 items
CATH GLIDE EXCHANG 5FR (CATHETERS) ×4 IMPLANT
CATH MHK1 5FR 80CM (CATHETERS) ×4 IMPLANT
CATH PIG 70CM (CATHETERS) ×4 IMPLANT
COIL MREYE 5X8 (Vascular Products) ×4 IMPLANT
COIL NESTER 14X8 (Vascular Products) ×8 IMPLANT
COIL TORNADO 4X3 (Vascular Products) ×16 IMPLANT
DEVICE STARCLOSE SE CLOSURE (Vascular Products) ×4 IMPLANT
GUIDEWIRE ANGLED .035 180CM (WIRE) ×4 IMPLANT
MICROCATH PROGREAT 2.8F 110 CM (CATHETERS) ×4 IMPLANT
PACK ANGIOGRAPHY (CUSTOM PROCEDURE TRAY) ×4 IMPLANT
SET INTRO CAPELLA COAXIAL (SET/KITS/TRAYS/PACK) ×4 IMPLANT
SHEATH ANL 5FRX45 (SHEATH) ×4 IMPLANT
SHEATH BRITE TIP 5FRX11 (SHEATH) ×4 IMPLANT
SYR MEDRAD MARK V 150ML (SYRINGE) ×4 IMPLANT
TUBING CONTRAST HIGH PRESS 72 (TUBING) ×4 IMPLANT
WIRE G 018X200 V18 (WIRE) ×4 IMPLANT
WIRE J 3MM .035X145CM (WIRE) ×4 IMPLANT
WIRE MAGIC TOR.035 180C (WIRE) ×4 IMPLANT

## 2015-04-11 NOTE — Progress Notes (Signed)
PULMONARY / CRITICAL CARE MEDICINE   Name: Joseph [name] MRN: [id number] DOB: 1966-04-08    ADMISSION DATE:  04/06/2015  INITIAL PRESENTATION:  21 M underwent elective L kidney bx 10/10 c/b severe hemorrhage and hemorrhagic shock  MAJOR EVENTS/TEST RESULTS: 10/11 CTAP: Moderately large left subcapsular hematoma with mild compression of the left renal parenchyma 10/12 CTAP: Moderately large subcapsular hematoma around the left kidney appears about the same as yesterday. Hemorrhagic stranding in the left perirenal fat is about the same. There is increasing hemorrhage in the retroperitoneum, celiac axis, gastrohepatic ligament, posterior subdiaphragmatic space, and with development of free fluid, likely intraperitoneal hemorrhage around the liver and extending into the pelvis. Enlarged retroperitoneal lymph nodes similar to previous study. New left pleural effusion with basilar atelectasis 10/12 CRRT initiated 10/15: Acute severe left flank pain, stat CT abdomen pelvis showed worsening hematoma in the left kidney.  INDWELLING DEVICES:: R femoral HS cath 10/11 >>    SUBJECTIVE:  No new complaints. Still with mild flank pain and abd pain. The patient was started on a Dilaudid PCA yesterday with improvement of his pain. He appears to be much more comfortable today.   VITAL SIGNS: Temp:  [98 F (36.7 C)-99.5 F (37.5 C)] 99.3 F (37.4 C) (10/16 0700) Pulse Rate:  [90-112] 111 (10/16 0710) Resp:  [8-42] 17 (10/16 0710) BP: (102-174)/(71-120) 122/81 mmHg (10/16 0710) SpO2:  [92 %-100 %] 95 % (10/16 0710) Weight:  [95 kg (209 lb 7 oz)] 95 kg (209 lb 7 oz) (10/16 0439) HEMODYNAMICS:   VENTILATOR SETTINGS:   INTAKE / OUTPUT:  Intake/Output Summary (Last 24 hours) at 04/11/15 0733 Last data filed at 04/11/15 0600  Gross per 24 hour  Intake 715.84 ml  Output   1301 ml  Net -585.16 ml    PHYSICAL EXAMINATION: General: WDWN, NAD Neuro: No focal deficits HEENT: NCAT,  WNL Cardiovascular: mildly tachy, reg, no M Lungs: clear anteriorly Abdomen: nondistended, mildy tender on L Ext: no edema  LABS:  CBC  Recent Labs Lab 04/09/15 1607 04/10/15 0448 04/10/15 0944 04/10/15 1537 04/11/15 0430  WBC 12.5* 18.1*  --   --  17.3*  HGB 6.7* 9.1* 8.8* 7.8* 6.8*  HCT 20.6* 26.6* 26.2* 23.5* 20.1*  PLT 89* 92*  --   --  158   Coag's  Recent Labs Lab 04/07/15 1055 04/08/15 0620 04/09/15 0431 04/11/15 0430  APTT 28 37* 43*  --   INR 1.34  --  1.54 1.30   BMET  Recent Labs Lab 04/09/15 0432 04/10/15 0448 04/11/15 0430  NA 142 139 142  K 4.5 3.9 4.2  CL 108 100* 103  CO2 30 31 30   BUN 30* 27* 54*  CREATININE 2.94* 2.89* 4.68*  GLUCOSE 119* 112* 122*   Electrolytes  Recent Labs Lab 04/09/15 0431 04/09/15 0432 04/10/15 0448 04/11/15 0430  CALCIUM  --  8.3* 7.7* 7.5*  MG 1.7  --  1.8 1.9  PHOS  --  2.6 2.3* 4.8*   Sepsis Markers No results for input(s): LATICACIDVEN, PROCALCITON, O2SATVEN in the last 168 hours. ABG No results for input(s): PHART, PCO2ART, PO2ART in the last 168 hours. Liver Enzymes  Recent Labs Lab 04/05/15 0838 04/07/15 0439  04/09/15 0432 04/10/15 0448 04/11/15 0430  AST 20 223*  --   --   --   --   ALT 29 380*  --   --   --   --   ALKPHOS 129* 92  --   --   --   --  BILITOT 0.5 0.4  --   --   --   --   ALBUMIN 4.1 3.4*  < > 2.9* 2.6* 2.6*  < > = values in this interval not displayed. Cardiac Enzymes No results for input(s): TROPONINI, PROBNP in the last 168 hours. Glucose  Recent Labs Lab 04/08/15 1948 04/08/15 2333 04/09/15 0411 04/09/15 0745 04/09/15 1152 04/09/15 1617  GLUCAP 125* 103* 110* 110* 164* 113*    CXR: no film   ASSESSMENT / PLAN: CARDIOVASCULAR A:  Hemorrhagic shock, no further hypotension. However, hemoglobin continues to fall. P:  Support with volume and blood products.  RENAL A:   CKD, biopsy consistent with amyloidosis. P:   Monitor BMET  intermittently Monitor I/Os Correct electrolytes as indicated   GASTROINTESTINAL A:  -  P:   Protonix for GI prophylaxis.   HEMATOLOGIC A:   Acute blood loss anemia-- Status post Thrombocytopenia   -Vascular surgery consulted yesterday for possible embolization, evaluation pending.  P:  Transfuse PRBCs for shock and to maintain Hgb > 8.0 -- 20 mg of DDAVP IV twice yesterday.   INFECTIOUS A:   No issues P:   Monitor temp, WBC count Micro and abx as above  ENDOCRINE A:   Mild stress induced hyperglycemia P:   Monitor glu on chem panels Consider SSI for glu > 180  NEUROLOGIC A:   Pain P:   Cont PRN dilaudid  Deep Ashby Dawes, M.D.  PCCM service  Pager [phone]  04/11/2015, 7:33 AM  Critical Care Attestation.  I have personally obtained a history, examined the patient, evaluated laboratory and imaging results, formulated the assessment and plan and placed orders. Case was discussed with nephrologist, critical care RN, patient, and patient's family at bedside. The Patient requires high complexity decision making for assessment and support, frequent evaluation and titration of therapies, application of advanced monitoring technologies and extensive interpretation of multiple databases. The patient has critical illness that could lead imminently to failure of 1 or more organ systems and requires the highest level of physician preparedness to intervene.  Critical Care Time devoted to patient care services described in this note is 35 minutes and is exclusive of time spent in procedures.

## 2015-04-11 NOTE — Progress Notes (Signed)
Spoke to Dr. Holley Raring regarding patient's hemoglobin this morning 6.8.  Per Dr. Holley Raring, will transfuse 1 unit pRBCs at this time while keeping the 2nd unit ready.  Dr. Holley Raring stated that he will be talking to Dr. Delana Meyer regarding patient's hemoglobin and will be here within the next few hours to speak to patient.

## 2015-04-11 NOTE — Progress Notes (Addendum)
[facility] NAME: Joseph [name]    MR#:  [id number]  DATE OF BIRTH:  Jan 03, 1966  SUBJECTIVE:  CHIEF COMPLAINT:  No chief complaint on file.  still back pain and abdominal pain, Hb down to 6.8 this am. On O2 [place] 4 L. Tachycardia at 111. REVIEW OF SYSTEMS:  CONSTITUTIONAL: No fever, generalized weakness.  EYES: No blurred or double vision.  EARS, NOSE, AND THROAT: No tinnitus or ear pain.  RESPIRATORY: No cough, shortness of breath, wheezing or hemoptysis.  CARDIOVASCULAR: No chest pain, orthopnea, edema.  GASTROINTESTINAL: has nausea, no vomiting, diarrhea, but has abdominal pain.  GENITOURINARY: No dysuria, hematuria.  ENDOCRINE: No polyuria, nocturia,  HEMATOLOGY: No anemia, easy bruising or bleeding SKIN: No rash or lesion. MUSCULOSKELETAL: Worsening back pain.   NEUROLOGIC: No tingling, numbness, weakness.  PSYCHIATRY: No anxiety or depression.   DRUG ALLERGIES:   Allergies  Allergen Reactions  . Ciprofloxacin Hcl   . Doxycycline Hyclate     VITALS:  Blood pressure 130/82, pulse 107, temperature 98.3 F (36.8 C), temperature source Oral, resp. rate 20, height 5\' 11"  (1.803 m), weight 95 kg (209 lb 7 oz), SpO2 96 %.  PHYSICAL EXAMINATION:  GENERAL:  49 y.o.-year-old patient lying in the bed with no acute distress.  EYES: Pupils equal, round, reactive to light and accommodation. No scleral icterus. Extraocular muscles intact.  HEENT: Head atraumatic, normocephalic. Oropharynx and nasopharynx clear. Moist oral mucosa. NECK:  Supple, no jugular venous distention. No thyroid enlargement, no tenderness.  LUNGS: Normal breath sounds bilaterally, no wheezing, rales,rhonchi or crepitation. No use of accessory muscles of respiration.  CARDIOVASCULAR: S1, S2 normal. No murmurs, rubs, or gallops.  ABDOMEN: Soft, nontender, nondistended. Bowel sounds present. No organomegaly or mass.  EXTREMITIES: No pedal edema, cyanosis, or  clubbing.  NEUROLOGIC: Cranial nerves II through XII are intact. Muscle strength 4/5 in all extremities. Sensation intact. Gait not checked.  PSYCHIATRIC: The patient is alert and oriented x 3.  SKIN: No obvious rash, lesion, or ulcer.    LABORATORY PANEL:   CBC  Recent Labs Lab 04/11/15 0430  WBC 17.3*  HGB 6.8*  HCT 20.1*  PLT 158   ------------------------------------------------------------------------------------------------------------------  Chemistries   Recent Labs Lab 04/07/15 0439  04/11/15 0430  NA 144  < > 142  K 7.4*  < > 4.2  CL 119*  < > 103  CO2 13*  < > 30  GLUCOSE 161*  < > 122*  BUN 59*  < > 54*  CREATININE 4.86*  < > 4.68*  CALCIUM 8.2*  < > 7.5*  MG 1.6*  < > 1.9  AST 223*  --   --   ALT 380*  --   --   ALKPHOS 92  --   --   BILITOT 0.4  --   --   < > = values in this interval not displayed. ------------------------------------------------------------------------------------------------------------------  Cardiac Enzymes No results for input(s): TROPONINI in the last 168 hours. ------------------------------------------------------------------------------------------------------------------  RADIOLOGY:  Ct Abdomen Pelvis Wo Contrast  04/10/2015  CLINICAL DATA:  49 year old male with acute renal failure superimposed on chronic kidney disease. Status post recent renal biopsy complicated by subcapsular hematoma. Acute onset of diaphoresis and intense left-sided abdominal pain 1 hour ago. EXAM: CT ABDOMEN AND PELVIS WITHOUT CONTRAST TECHNIQUE: Multidetector CT imaging of the abdomen and pelvis was performed following the standard protocol without IV contrast. COMPARISON:  CT the abdomen and pelvis without contrast 04/07/2015. FINDINGS:  Lower chest: Small right and moderate left pleural effusions. The left pleural effusion is associated with complete atelectasis of the visualized portions of the left lower lobe. There are patchy areas of ground-glass  attenuation in the right lung base, most evident in the right lower lobe, concerning for either developing infection, hemorrhage or sequela of recent aspiration. Hepatobiliary: No definite cystic or solid hepatic lesions are identified within the liver on today's noncontrast CT examination. Unenhanced appearance of the gallbladder is normal. Pancreas: No pancreatic mass or definite pancreatic/ peripancreatic fluid collections identified on today's noncontrast CT examination. Spleen: Unremarkable. Adrenals/Urinary Tract: Significant interval enlargement of the previously noted subcapsular hematoma, which now appears to be extending into the perinephric soft tissues. This currently measures up to 5.1 cm in thickness (previously 1.8 cm), and is associated with increasing compression of the left renal parenchyma. Other than an exophytic a low-attenuation lesion in the upper pole of the right kidney (which is incompletely characterized, but likely a cyst), the right kidney normal in appearance on today's noncontrast CT examination. Right adrenal gland is normal in appearance. Left adrenal gland appears enlarged, and is high attenuation, suggesting recent adrenal hemorrhage. No hydroureteronephrosis. Urinary bladder is nearly completely decompressed with a Foley balloon catheter in place. Stomach/Bowel: Unenhanced appearance of the stomach is normal. No pathologic dilatation of small bowel or colon. Vascular/Lymphatic: Atherosclerotic calcifications throughout the abdominal and pelvic vasculature, without definite aneurysm. Arm catheter injuring the right femoral vein extending into the IVC (terminating below the level of the renal veins). Numerous borderline enlarged and mildly enlarged retroperitoneal lymph nodes, largest of which is in the left para-aortic nodal station adjacent to the left renal hilum, likely reactive. Reproductive: Prostate gland and seminal vesicles are unremarkable in appearance. Other: Small  volume of intermediate attenuation ascites, likely with proteinaceous or hemorrhagic contents. No pneumoperitoneum. Musculoskeletal: There are no aggressive appearing lytic or blastic lesions noted in the visualized portions of the skeleton. IMPRESSION: 1. Enlarging left renal subcapsular hematoma common now with extension into the perinephric soft tissues of the left retroperitoneum, and increasing compression of the left renal parenchyma. Clinical correlation for signs and symptoms of worsening renovascular hypertension is suggested. 2. Associated with this, there is now enlargement and high attenuation in the left adrenal gland, presumably from spontaneous adrenal hemorrhage. 3. Moderate left pleural effusion associated with complete passive atelectasis of the visualized portions of the left lower lobe. In addition, there appears to be extensive airspace consolidation in the right lung base (most severe in the right lower lobe), which could reflect developing infection, areas of alveolar hemorrhage, or sequela of aspiration. 4. Additional incidental findings, as above. These results will be called to the ordering clinician or representative by the Radiologist Assistant, and communication documented in the PACS or zVision Dashboard. Electronically Signed   By: Vinnie Langton M.D.   On: 04/10/2015 09:40    EKG:   Orders placed or performed in visit on 04/06/14  . EKG 12-Lead    ASSESSMENT AND PLAN:   1. Acute renal failure/CKD stage IV progressed to ESRD. He got CRRT for 3 days and need HD. Per Dr. Holley Raring , Will hold off on HD for now. Continue to monitor electrolytes and UOP closely.Possible AA amyloid noted on prelim renal biopsy, but official final report still pending per Dr. Holley Raring.  *  left scapsular hematoma after renal biopsy.  Perinephric hematoma developed which was initially subcapsular, later with extracapsular bleeding. Per CT abd yesterday, worsening of hematoma, along with  possible adrenal gland hemorrhage.  Hb down to 6.8. Planning for embolization of left kidney today, awaiting further input from Dr. Delana Meyer this AM.  * Hyperkalemia. Improved.  2.  Hpotension: improved.  3.Acute blood loss anemia due to 1. Hb increased from 6.6 to 7.5 after 2 unit PRBC transfusion. Hb down to 6.7 the day before yesterday pm, given 2 unit PRBC. Hb was  9.1 yesterday am but decreased to 6.8 this am. On PRBC transfusion now. Follow-up Hb q6h.  4. Hypomagnesemia. Mag supplement. Improved.  * Leukocytosis. Possible due to hematoma. F/u CBC.  * Thrombocytopenia. Improved after given platelet transfusion and vitamin K. He got 20 mg of DDAVP IV twice a day yesterday per Dr. Isidore Moos. follow-up CBC.  Hypoxia and tachycardia, due to symptomatic anemia/ESRD. On O2 [place] 4L. Getting PRBC transfusion now.  I discussed with Dr. Holley Raring and Dr. Isidore Moos.  All the records are reviewed and case discussed with Care Management/Social Workerr. Management plans discussed with the patient, his wife and mother, and they are in agreement.  CODE STATUS: Full code.  TOTAL CRITICAL TIME TAKING CARE OF THIS PATIENT: 43 minutes.   POSSIBLE D/C IN >3 DAYS, DEPENDING ON CLINICAL CONDITION.   Demetrios Loll M.D on 04/11/2015 at 10:03 AM  Between 7am to 6pm - Pager - [phone]  After 6pm go to [email or web address] - password [email or web address] [facility]  Office  [phone]  CC: Primary care physician; Pcp Not In System

## 2015-04-11 NOTE — Op Note (Signed)
[facility]  Percutaneous Study/Intervention Procedural Note   Date of Surgery: 04/11/2015  Surgeon:Eliu Batch, [name]   Pre-operative Diagnosis: Left renal hemorrhage post procedure; amyloid nephropathy; anemia of blood loss Post-operative diagnosis:  Same  Procedure(s) Performed:  1.  Selective contrast injection of left adrenal artery second-order catheter placement  2.  Selective contrast injection of the left main renal artery with selection of a inferior pole branch additional second-order catheter placement  3.  Coil embolization of the left adrenal artery  4.  Coil embolization of the inferior pole left renal artery  5.  Coil embolization of the main renal artery  6.  Ultrasound-guided access to the right common femoral artery  7.  Closure of right femoral arteriotomy with a Star close device  Anesthesia: Conscious sedation with IV Versed and fentanyl  Sheath: 5 [place] Ansell right common femoral  Contrast: 50  Fluoroscopy Time: Approximately 11.5 minutes  Indications:  Patient underwent left renal biopsy and developed bleeding post procedure. He has been monitored in the intensive care unit unfortunately his bleeding has not subsided and at this point it is the best option to embolize the kidney to prevent further hemorrhage. The risks and benefits have been reviewed in detail with the patient and his wife. Discussion today as well as discussion yesterday were both in excess of 50 minutes. The patient has agreed to proceed with angiography and embolization. Given the finding on CT from yesterday the left adrenal will also need to be addressed.  Procedure:  Joseph Plonskiis a 49 y.o. male who was identified and appropriate procedural time out was performed.  The patient was then placed supine on the table and prepped and draped in the usual sterile fashion.  Ultrasound was used to evaluate the right common femoral artery.  It was patent .  A digital  ultrasound image was acquired.  A micropuncture needle was used to access the right common femoral artery under direct ultrasound guidance and a permanent image was performed.  A microwire was then advanced under fluoroscopy followed by a micro-sheath. A 0.035 J wire was advanced without resistance and a 5Fr sheath was placed.    Pigtail catheter was positioned at the level of T12 and imaging of the aorta was obtained. 2 separate injections were required to localize the left renal artery. It was identified at the level of L1 and subsequently selected with AV S1 catheter and a floppy Glidewire. The Glidewire was then exchanged for a Magic torque and subsequently a 5 [place] Ansell sheath was advanced and positioned with the tip within the origin of the left renal artery. Imaging was then obtained under magnified view which demonstrated the adrenal which had its origin very close to the origin of the left renal as well as a 2-3 mm inferior pole artery which had its origin right next to the adrenal. These 2 arteries were so close to the aorta that coiling the main renal artery would not adequately treat these and therefore they would have to be super selected and treated individually. This is exactly what was done. Using the floppy Glidewire and a straight 5 French glide catheter first the inferior pole artery was selected angiography was performed and subsequently to 5 x 3 coils were deployed without difficulty.  The 18 wire was then advanced across the coils and the 5 [place] catheter removed and the sheath was then pulled back so that the tip was just barely in the renal artery right at the  edge of the aorta. With the VAT wire securing access to the renal artery as a buddy wire the Magic torque wire was reintroduced and then negotiated into the adrenal. Once appropriate purchase with the Magic torque had been achieved within the adrenal the VAT wire was removed and the 5 [place] glide catheter was advanced over the  wire into the adrenal sheath was repositioned slightly to the edge of the adrenal artery the wire was removed angiography was then performed and a single 5 x 3 coil was deployed within the adrenal.  Having super selected both of these 2 arteries attention was then turned to the main renal artery. Again using the buddy wire technique the VAT and was advanced into the adrenal the 5 French glide catheter removed the sheath pulled back so that it was just at the edge of the aorta and the Magic torque wire was then advanced into the main renal. Once appropriate purchase of been attained the VAT wire was removed and the catheter was advanced over the Magic torque and then the sheath was advanced so that appropriate purchase was secured.  A total of 3 coils were now placed within the main renal artery to 8 x 14 and 18.5 x 7. Follow-up imaging through the sheath at the origin of the renal artery now demonstrated absence of flow distally in the main renal artery and absence of flow in both the adrenal and the lower pole artery.  Sheath was then pulled into the right external oblique view was obtained and a Star close device deployed. There were no immediate complications.  Findings:   Aortogram and left renal artery:  Initial images demonstrate the left renal artery is patent on selective images both the above-noted adrenal as well as a lower pole artery are identified. These 2 arteries are first selected and then coiled. And subsequently the sheath is repositioned in the main left renal artery and this too was coiled successfully. Final injection demonstrates absence of flow to the left kidney as well as the adrenal.  Successful embolization of the left renal and adrenal as described above to prevent further hemorrhage    Disposition: Patient was taken to the recovery room in stable condition having tolerated the procedure well.  Taylee Gunnells, [name] 04/11/2015,3:54 PM

## 2015-04-11 NOTE — Progress Notes (Signed)
[facility]  ROUNDING NOTE   Subjective:  Hemoglobin has dropped to 6.8 this AM.  Discussed with Dr. Delana Meyer. We are currently planning for embolization of left kidney. Pt wishes to discuss this further with Dr. Delana Meyer and I both present at the same time.   Objective:  Vital signs in last 24 hours:  Temp:  [98 F (36.7 C)-99.5 F (37.5 C)] 98.3 F (36.8 C) (10/16 0800) Pulse Rate:  [96-112] 107 (10/16 0800) Resp:  [8-39] 20 (10/16 0800) BP: (102-156)/(71-120) 130/82 mmHg (10/16 0800) SpO2:  [92 %-100 %] 96 % (10/16 0800) Weight:  [95 kg (209 lb 7 oz)] 95 kg (209 lb 7 oz) (10/16 0439)  Weight change: -2.1 kg (-4 lb 10.1 oz) Filed Weights   04/09/15 0427 04/10/15 0424 04/11/15 0439  Weight: 97.7 kg (215 lb 6.2 oz) 97.1 kg (214 lb 1.1 oz) 95 kg (209 lb 7 oz)    Intake/Output: I/O last 3 completed shifts: In: 1330.8 [I.V.:180.8; Blood:1040; Other:10; IV Piggyback:100] Out: 1901 [Urine:1900; Stool:1]   Intake/Output this shift:  Total I/O In: -  Out: 175 [Urine:175]  Physical Exam: General: No acute distress  Head: Normocephalic, atraumatic. Moist oral mucosal membranes  Eyes: Anicteric, pallor noted  Neck: Supple, trachea midline  Lungs:  Clear to auscultation normal effort  Heart: Regular rate and rhythm no rubs  Abdomen:  Soft, mild left sided tenderness, no rebound, BS present  Extremities: no peripheral edema.  Neurologic: Nonfocal, moving all four extremities  Skin: No lesions  Access: Right femoral temp HD catheter.    Basic Metabolic Panel:  Recent Labs Lab 04/08/15 0952 04/08/15 1724 04/08/15 2231 04/09/15 0431 04/09/15 0432 04/10/15 0448 04/11/15 0430  NA 140 140 140  --  142 139 142  K 4.6 4.9 4.6  --  4.5 3.9 4.2  CL 103 105 105  --  108 100* 103  CO2 31 31 30   --  30 31 30   GLUCOSE 133* 125* 119*  --  119* 112* 122*  BUN 33* 29* 30*  --  30* 27* 54*  CREATININE 3.07* 2.87* 2.91*  --  2.94* 2.89* 4.68*  CALCIUM 8.2* 8.2* 8.4*   --  8.3* 7.7* 7.5*  MG 1.6* 1.7  --  1.7  --  1.8 1.9  PHOS 3.5 3.2 2.6  --  2.6 2.3* 4.8*    Liver Function Tests:  Recent Labs Lab 04/05/15 0838 04/07/15 0439  04/08/15 1724 04/08/15 2231 04/09/15 0432 04/10/15 0448 04/11/15 0430  AST 20 223*  --   --   --   --   --   --   ALT 29 380*  --   --   --   --   --   --   ALKPHOS 129* 92  --   --   --   --   --   --   BILITOT 0.5 0.4  --   --   --   --   --   --   PROT 7.5 6.2*  --   --   --   --   --   --   ALBUMIN 4.1 3.4*  < > 3.0* 3.0* 2.9* 2.6* 2.6*  < > = values in this interval not displayed. No results for input(s): LIPASE, AMYLASE in the last 168 hours. No results for input(s): AMMONIA in the last 168 hours.  CBC:  Recent Labs Lab 04/05/15 0838  04/08/15 2231 04/09/15 0431 04/09/15 1607 04/10/15 0448 04/10/15  [id number] 04/10/15 1537 04/11/15 0430  WBC 9.4  < > 17.3* 17.4* 12.5* 18.1*  --   --  17.3*  NEUTROABS 5.6  --   --   --   --   --   --   --   --   HGB 10.2*  < > 7.6* 7.5* 6.7* 9.1* 8.8* 7.8* 6.8*  HCT 32.0*  < > 23.4* 22.7* 20.6* 26.6* 26.2* 23.5* 20.1*  MCV 86.6  < > 85.1 86.2 85.6 86.2  --   --  85.9  PLT 246  < > 64* 57* 89* 92*  --   --  158  < > = values in this interval not displayed.  Cardiac Enzymes: No results for input(s): CKTOTAL, CKMB, CKMBINDEX, TROPONINI in the last 168 hours.  BNP: Invalid input(s): POCBNP  CBG:  Recent Labs Lab 04/08/15 2333 04/09/15 0411 04/09/15 0745 04/09/15 1152 04/09/15 1617  GLUCAP 103* 110* 110* 164* 113*    Microbiology: Results for orders placed or performed during the hospital encounter of 04/06/15  MRSA PCR Screening     Status: None   Collection Time: 04/07/15  9:49 AM  Result Value Ref Range Status   MRSA by PCR NEGATIVE NEGATIVE Final    Comment:        The GeneXpert MRSA Assay (FDA approved for NASAL specimens only), is one component of a comprehensive MRSA colonization surveillance program. It is not intended to diagnose MRSA infection  nor to guide or monitor treatment for MRSA infections.     Coagulation Studies:  Recent Labs  04/09/15 0431 04/11/15 0430  LABPROT 18.7* 16.4*  INR 1.54 1.30    Urinalysis: No results for input(s): COLORURINE, LABSPEC, PHURINE, GLUCOSEU, HGBUR, BILIRUBINUR, KETONESUR, PROTEINUR, UROBILINOGEN, NITRITE, LEUKOCYTESUR in the last 72 hours.  Invalid input(s): APPERANCEUR    Imaging: Ct Abdomen Pelvis Wo Contrast  04/10/2015  CLINICAL DATA:  49 year old male with acute renal failure superimposed on chronic kidney disease. Status post recent renal biopsy complicated by subcapsular hematoma. Acute onset of diaphoresis and intense left-sided abdominal pain 1 hour ago. EXAM: CT ABDOMEN AND PELVIS WITHOUT CONTRAST TECHNIQUE: Multidetector CT imaging of the abdomen and pelvis was performed following the standard protocol without IV contrast. COMPARISON:  CT the abdomen and pelvis without contrast 04/07/2015. FINDINGS: Lower chest: Small right and moderate left pleural effusions. The left pleural effusion is associated with complete atelectasis of the visualized portions of the left lower lobe. There are patchy areas of ground-glass attenuation in the right lung base, most evident in the right lower lobe, concerning for either developing infection, hemorrhage or sequela of recent aspiration. Hepatobiliary: No definite cystic or solid hepatic lesions are identified within the liver on today's noncontrast CT examination. Unenhanced appearance of the gallbladder is normal. Pancreas: No pancreatic mass or definite pancreatic/ peripancreatic fluid collections identified on today's noncontrast CT examination. Spleen: Unremarkable. Adrenals/Urinary Tract: Significant interval enlargement of the previously noted subcapsular hematoma, which now appears to be extending into the perinephric soft tissues. This currently measures up to 5.1 cm in thickness (previously 1.8 cm), and is associated with increasing  compression of the left renal parenchyma. Other than an exophytic a low-attenuation lesion in the upper pole of the right kidney (which is incompletely characterized, but likely a cyst), the right kidney normal in appearance on today's noncontrast CT examination. Right adrenal gland is normal in appearance. Left adrenal gland appears enlarged, and is high attenuation, suggesting recent adrenal hemorrhage. No hydroureteronephrosis. Urinary bladder is nearly completely  decompressed with a Foley balloon catheter in place. Stomach/Bowel: Unenhanced appearance of the stomach is normal. No pathologic dilatation of small bowel or colon. Vascular/Lymphatic: Atherosclerotic calcifications throughout the abdominal and pelvic vasculature, without definite aneurysm. Arm catheter injuring the right femoral vein extending into the IVC (terminating below the level of the renal veins). Numerous borderline enlarged and mildly enlarged retroperitoneal lymph nodes, largest of which is in the left para-aortic nodal station adjacent to the left renal hilum, likely reactive. Reproductive: Prostate gland and seminal vesicles are unremarkable in appearance. Other: Small volume of intermediate attenuation ascites, likely with proteinaceous or hemorrhagic contents. No pneumoperitoneum. Musculoskeletal: There are no aggressive appearing lytic or blastic lesions noted in the visualized portions of the skeleton. IMPRESSION: 1. Enlarging left renal subcapsular hematoma common now with extension into the perinephric soft tissues of the left retroperitoneum, and increasing compression of the left renal parenchyma. Clinical correlation for signs and symptoms of worsening renovascular hypertension is suggested. 2. Associated with this, there is now enlargement and high attenuation in the left adrenal gland, presumably from spontaneous adrenal hemorrhage. 3. Moderate left pleural effusion associated with complete passive atelectasis of the visualized  portions of the left lower lobe. In addition, there appears to be extensive airspace consolidation in the right lung base (most severe in the right lower lobe), which could reflect developing infection, areas of alveolar hemorrhage, or sequela of aspiration. 4. Additional incidental findings, as above. These results will be called to the ordering clinician or representative by the Radiologist Assistant, and communication documented in the PACS or zVision Dashboard. Electronically Signed   By: Vinnie Langton M.D.   On: 04/10/2015 09:40     Medications:   . pureflow 15,000 mL (04/08/15 1600)   . sodium chloride   Intravenous Once  . antiseptic oral rinse  7 mL Mouth Rinse BID  .  ceFAZolin (ANCEF) IV  1 g Intravenous On Call  . HYDROmorphone PCA 0.3 mg/mL   Intravenous 6 times per day  . Influenza vac split quadrivalent PF  0.5 mL Intramuscular Tomorrow-1000  . pantoprazole (PROTONIX) IV  40 mg Intravenous Q12H   sodium chloride, sodium chloride, acetaminophen, alteplase, diphenhydrAMINE **OR** diphenhydrAMINE, heparin, heparin, HYDROmorphone (DILAUDID) injection, lidocaine (PF), lidocaine-prilocaine, naloxone **AND** sodium chloride, ondansetron (ZOFRAN) IV, oxyCODONE-acetaminophen, pentafluoroprop-tetrafluoroeth  Assessment/ Plan:  49 y.o. male with past medical history of hypertension, hyperlipidemia, anemia chronic kidney disease, peptic ulcer disease, CKD stage IV, anemia of CKD proteinuria, hyperkalemia s/p renal bioy 04/06/15 with subsequent left perinephric hematoma.  1.  Acute renal failure/CKD stage IV/Proteinuria:  Pt s/p left renal biopsy. Perinephric hematoma developed which was initially subcapsular, subsequently extracapsular bleeding noted.  Possible AA amyloid noted on prelim renal biopsy, but official final report still pending. -Suspect pt has reached ESRD even prior to embolization.  We are currently planning for embolization of left kidney, awaiting further discussion with  pt and Dr. Delana Meyer.    2.  Left perinephric hematoma:  Noted post biopsy, was initially subcapsular, now extracapsular.   -CT scan showed progression yesterday, planning for embolization of left kidney today, awaiting further input from Dr. Delana Meyer this AM.  3.  Post hemorrhagic anemia:  hgb down to 6.8 this AM, pt being given additional PRBC infusion today.   4.  Hyperkalemia:  K currently 4.2 and acceptable.       LOS: 4 Avant Printy 10/16/20169:49 AM

## 2015-04-11 NOTE — Progress Notes (Signed)
S/p renal artery embolization. S/p 2 units PRBC transfusion this am. Back to ICU. VS is stable. Discussed withy Dr. Delana Meyer. F/u Hb.

## 2015-04-12 ENCOUNTER — Inpatient Hospital Stay: Payer: BLUE CROSS/BLUE SHIELD

## 2015-04-12 LAB — PREPARE PLATELET PHERESIS: UNIT DIVISION: 0

## 2015-04-12 LAB — COMPREHENSIVE METABOLIC PANEL
ALBUMIN: 2.5 g/dL — AB (ref 3.5–5.0)
ALT: 119 U/L — ABNORMAL HIGH (ref 17–63)
AST: 32 U/L (ref 15–41)
Alkaline Phosphatase: 195 U/L — ABNORMAL HIGH (ref 38–126)
Anion gap: 6 (ref 5–15)
BILIRUBIN TOTAL: 4.1 mg/dL — AB (ref 0.3–1.2)
BUN: 40 mg/dL — AB (ref 6–20)
CHLORIDE: 100 mmol/L — AB (ref 101–111)
CO2: 32 mmol/L (ref 22–32)
Calcium: 7.4 mg/dL — ABNORMAL LOW (ref 8.9–10.3)
Creatinine, Ser: 3.66 mg/dL — ABNORMAL HIGH (ref 0.61–1.24)
GFR calc Af Amer: 21 mL/min — ABNORMAL LOW (ref >60)
GFR calc non Af Amer: 18 mL/min — ABNORMAL LOW (ref >60)
GLUCOSE: 129 mg/dL — AB (ref 65–99)
POTASSIUM: 3.5 mmol/L (ref 3.5–5.1)
Sodium: 138 mmol/L (ref 135–145)
TOTAL PROTEIN: 5.4 g/dL — AB (ref 6.5–8.1)

## 2015-04-12 LAB — HEMOGLOBIN AND HEMATOCRIT, BLOOD
HCT: 21 % — ABNORMAL LOW (ref 40.0–52.0)
HEMATOCRIT: 20.5 % — AB (ref 40.0–52.0)
Hemoglobin: 7 g/dL — ABNORMAL LOW (ref 13.0–18.0)
Hemoglobin: 6.7 g/dL — ABNORMAL LOW (ref 13.0–18.0)

## 2015-04-12 LAB — CBC
HEMATOCRIT: 21.4 % — AB (ref 40.0–52.0)
Hemoglobin: 7.2 g/dL — ABNORMAL LOW (ref 13.0–18.0)
MCH: 28.8 pg (ref 26.0–34.0)
MCHC: 33.5 g/dL (ref 32.0–36.0)
MCV: 86 fL (ref 80.0–100.0)
Platelets: 158 10*3/uL (ref 150–440)
RBC: 2.49 MIL/uL — ABNORMAL LOW (ref 4.40–5.90)
RDW: 14.8 % — AB (ref 11.5–14.5)
WBC: 16.7 10*3/uL — AB (ref 3.8–10.6)

## 2015-04-12 LAB — FERRITIN: Ferritin: 911 ng/mL — ABNORMAL HIGH (ref 24–336)

## 2015-04-12 LAB — RENAL FUNCTION PANEL
ANION GAP: 7 (ref 5–15)
Albumin: 2.5 g/dL — ABNORMAL LOW (ref 3.5–5.0)
BUN: 39 mg/dL — ABNORMAL HIGH (ref 6–20)
CHLORIDE: 99 mmol/L — AB (ref 101–111)
CO2: 31 mmol/L (ref 22–32)
CREATININE: 3.67 mg/dL — AB (ref 0.61–1.24)
Calcium: 7.2 mg/dL — ABNORMAL LOW (ref 8.9–10.3)
GFR, EST AFRICAN AMERICAN: 21 mL/min — AB (ref >60)
GFR, EST NON AFRICAN AMERICAN: 18 mL/min — AB (ref >60)
Glucose, Bld: 125 mg/dL — ABNORMAL HIGH (ref 65–99)
POTASSIUM: 3.4 mmol/L — AB (ref 3.5–5.1)
Phosphorus: 3.4 mg/dL (ref 2.5–4.6)
Sodium: 137 mmol/L (ref 135–145)

## 2015-04-12 LAB — IRON AND TIBC
IRON: 11 ug/dL — AB (ref 45–182)
Saturation Ratios: 6 % — ABNORMAL LOW (ref 17.9–39.5)
TIBC: 191 ug/dL — AB (ref 250–450)
UIBC: 180 ug/dL

## 2015-04-12 LAB — MAGNESIUM: MAGNESIUM: 2 mg/dL (ref 1.7–2.4)

## 2015-04-12 MED ORDER — NALOXONE HCL 0.4 MG/ML IJ SOLN: 0.4000 mg | INTRAMUSCULAR | Status: DC | PRN

## 2015-04-12 MED ORDER — DIPHENHYDRAMINE HCL 50 MG/ML IJ SOLN: 12.5000 mg | Freq: Four times a day (QID) | INTRAMUSCULAR | Status: DC | PRN

## 2015-04-12 MED ORDER — ONDANSETRON HCL 4 MG/2ML IJ SOLN
4.0000 mg | Freq: Four times a day (QID) | INTRAMUSCULAR | Status: DC | PRN
  Administered 2015-04-12: 4 mg via INTRAVENOUS
  Filled 2015-04-12: qty 2

## 2015-04-12 MED ORDER — HYDROMORPHONE 1 MG/ML IV SOLN
INTRAVENOUS | Status: DC
  Administered 2015-04-12: 16:00:00 via INTRAVENOUS
  Administered 2015-04-12: 1.7 mg via INTRAVENOUS
  Administered 2015-04-13 (×3): 1.8 mg via INTRAVENOUS
  Administered 2015-04-13: 1.5 mg via INTRAVENOUS
  Administered 2015-04-13: 1.2 mg via INTRAVENOUS
  Administered 2015-04-13: 3.6 mg via INTRAVENOUS
  Administered 2015-04-14: 0.6 mg via INTRAVENOUS
  Administered 2015-04-14: 3 mg via INTRAVENOUS
  Administered 2015-04-14: 11:00:00 via INTRAVENOUS
  Administered 2015-04-14: 0.9 mg via INTRAVENOUS
  Administered 2015-04-14: 1.2 mg via INTRAVENOUS
  Filled 2015-04-12: qty 25

## 2015-04-12 MED ORDER — DIPHENHYDRAMINE HCL 12.5 MG/5ML PO ELIX
12.5000 mg | ORAL_SOLUTION | Freq: Four times a day (QID) | ORAL | Status: DC | PRN
  Administered 2015-04-13 (×2): 12.5 mg via ORAL
  Filled 2015-04-12 (×2): qty 5

## 2015-04-12 MED ORDER — SODIUM CHLORIDE 0.9 % IJ SOLN: 9.0000 mL | INTRAMUSCULAR | Status: DC | PRN

## 2015-04-12 NOTE — Progress Notes (Signed)
Report called to Santiago Glad, RN on [place]. Patient moved to room 201. PCA confirmed with Santiago Glad, RN. Family present when patient informed of transfer.

## 2015-04-12 NOTE — Progress Notes (Signed)
Subjective:  Results for Joseph [name], Joseph [name] (MRN [id number]) as of 04/12/2015 08:49  Ref. Range 04/10/2015 15:37 04/11/2015 04:30 04/11/2015 13:29 04/11/2015 21:46 04/12/2015 04:18  Hemoglobin Latest Ref Range: 13.0-18.0 g/dL 7.8 (L) 6.8 (L) 7.5 (L) 7.3 (L) 7.2 (L)   Underwent embolization of left kidney Able to tolerate clears UOP 725 cc  .   Objective:  Vital signs in last 24 hours:  Temp:  [98.3 F (36.8 C)-99.7 F (37.6 C)] 98.3 F (36.8 C) (10/17 0732) Pulse Rate:  [96-118] 98 (10/17 0831) Resp:  [9-36] 11 (10/17 0831) BP: (82-154)/(66-122) 124/78 mmHg (10/17 0800) SpO2:  [87 %-100 %] 87 % (10/17 0831) FiO2 (%):  [91 %] 91 % (10/17 0442) Weight:  [96.6 kg (212 lb 15.4 oz)] 96.6 kg (212 lb 15.4 oz) (10/17 0428)  Weight change: 1.6 kg (3 lb 8.4 oz) Filed Weights   04/10/15 0424 04/11/15 0439 04/12/15 0428  Weight: 97.1 kg (214 lb 1.1 oz) 95 kg (209 lb 7 oz) 96.6 kg (212 lb 15.4 oz)    Intake/Output: I/O last 3 completed shifts: In: 704.5 [I.V.:290.5; Blood:304; Other:10; IV Piggyback:100] Out: [id number] [Urine:1165]   Intake/Output this shift:  Total I/O In: -  Out: 30 [Urine:30]  Physical Exam: General: No acute distress  Head: Normocephalic, atraumatic. Moist oral mucosal membranes  Eyes: Anicteric, pallor noted  Neck: Supple, trachea midline  Lungs:  Clear to auscultation normal effort  Heart: Regular rate and rhythm no rubs  Abdomen:  Soft, mild left sided tenderness, no rebound, BS present  Extremities: no peripheral edema.  Neurologic: Nonfocal, moving all four extremities  Skin: No lesions  Access: Right femoral temp HD catheter.    Basic Metabolic Panel:  Recent Labs Lab 04/08/15 1724 04/08/15 2231 04/09/15 0431 04/09/15 0432 04/10/15 0448 04/11/15 0430 04/12/15 0418  NA 140 140  --  142 139 142 137  138  K 4.9 4.6  --  4.5 3.9 4.2 3.4*  3.5  CL 105 105  --  108 100* 103 99*  100*  CO2 31 30  --  30 31 30 31   32  GLUCOSE 125* 119*  --  119*  112* 122* 125*  129*  BUN 29* 30*  --  30* 27* 54* 39*  40*  CREATININE 2.87* 2.91*  --  2.94* 2.89* 4.68* 3.67*  3.66*  CALCIUM 8.2* 8.4*  --  8.3* 7.7* 7.5* 7.2*  7.4*  MG 1.7  --  1.7  --  1.8 1.9 2.0  PHOS 3.2 2.6  --  2.6 2.3* 4.8* 3.4    Liver Function Tests:  Recent Labs Lab 04/07/15 0439  04/08/15 2231 04/09/15 0432 04/10/15 0448 04/11/15 0430 04/12/15 0418  AST 223*  --   --   --   --   --  32  ALT 380*  --   --   --   --   --  119*  ALKPHOS 92  --   --   --   --   --  195*  BILITOT 0.4  --   --   --   --   --  4.1*  PROT 6.2*  --   --   --   --   --  5.4*  ALBUMIN 3.4*  < > 3.0* 2.9* 2.6* 2.6* 2.5*  2.5*  < > = values in this interval not displayed. No results for input(s): LIPASE, AMYLASE in the last 168 hours. No results for input(s): AMMONIA in the last 168 hours.  CBC:  Recent Labs Lab 04/09/15 0431 04/09/15 1607 04/10/15 0448  04/10/15 1537 04/11/15 0430 04/11/15 1329 04/11/15 2146 04/12/15 0418  WBC 17.4* 12.5* 18.1*  --   --  17.3*  --   --  16.7*  HGB 7.5* 6.7* 9.1*  < > 7.8* 6.8* 7.5* 7.3* 7.2*  HCT 22.7* 20.6* 26.6*  < > 23.5* 20.1* 22.6* 21.6* 21.4*  MCV 86.2 85.6 86.2  --   --  85.9  --   --  86.0  PLT 57* 89* 92*  --   --  158  --   --  158  < > = values in this interval not displayed.  Cardiac Enzymes: No results for input(s): CKTOTAL, CKMB, CKMBINDEX, TROPONINI in the last 168 hours.  BNP: Invalid input(s): POCBNP  CBG:  Recent Labs Lab 04/08/15 2333 04/09/15 0411 04/09/15 0745 04/09/15 1152 04/09/15 1617  GLUCAP 103* 110* 110* 164* 113*    Microbiology: Results for orders placed or performed during the hospital encounter of 04/06/15  MRSA PCR Screening     Status: None   Collection Time: 04/07/15  9:49 AM  Result Value Ref Range Status   MRSA by PCR NEGATIVE NEGATIVE Final    Comment:        The GeneXpert MRSA Assay (FDA approved for NASAL specimens only), is one component of a comprehensive MRSA  colonization surveillance program. It is not intended to diagnose MRSA infection nor to guide or monitor treatment for MRSA infections.     Coagulation Studies:  Recent Labs  04/11/15 0430  LABPROT 16.4*  INR 1.30    Urinalysis: No results for input(s): COLORURINE, LABSPEC, PHURINE, GLUCOSEU, HGBUR, BILIRUBINUR, KETONESUR, PROTEINUR, UROBILINOGEN, NITRITE, LEUKOCYTESUR in the last 72 hours.  Invalid input(s): APPERANCEUR    Imaging: Ct Abdomen Pelvis Wo Contrast  04/10/2015  CLINICAL DATA:  49 year old male with acute renal failure superimposed on chronic kidney disease. Status post recent renal biopsy complicated by subcapsular hematoma. Acute onset of diaphoresis and intense left-sided abdominal pain 1 hour ago. EXAM: CT ABDOMEN AND PELVIS WITHOUT CONTRAST TECHNIQUE: Multidetector CT imaging of the abdomen and pelvis was performed following the standard protocol without IV contrast. COMPARISON:  CT the abdomen and pelvis without contrast 04/07/2015. FINDINGS: Lower chest: Small right and moderate left pleural effusions. The left pleural effusion is associated with complete atelectasis of the visualized portions of the left lower lobe. There are patchy areas of ground-glass attenuation in the right lung base, most evident in the right lower lobe, concerning for either developing infection, hemorrhage or sequela of recent aspiration. Hepatobiliary: No definite cystic or solid hepatic lesions are identified within the liver on today's noncontrast CT examination. Unenhanced appearance of the gallbladder is normal. Pancreas: No pancreatic mass or definite pancreatic/ peripancreatic fluid collections identified on today's noncontrast CT examination. Spleen: Unremarkable. Adrenals/Urinary Tract: Significant interval enlargement of the previously noted subcapsular hematoma, which now appears to be extending into the perinephric soft tissues. This currently measures up to 5.1 cm in thickness  (previously 1.8 cm), and is associated with increasing compression of the left renal parenchyma. Other than an exophytic a low-attenuation lesion in the upper pole of the right kidney (which is incompletely characterized, but likely a cyst), the right kidney normal in appearance on today's noncontrast CT examination. Right adrenal gland is normal in appearance. Left adrenal gland appears enlarged, and is high attenuation, suggesting recent adrenal hemorrhage. No hydroureteronephrosis. Urinary bladder is nearly completely decompressed with a Foley balloon catheter in place.  Stomach/Bowel: Unenhanced appearance of the stomach is normal. No pathologic dilatation of small bowel or colon. Vascular/Lymphatic: Atherosclerotic calcifications throughout the abdominal and pelvic vasculature, without definite aneurysm. Arm catheter injuring the right femoral vein extending into the IVC (terminating below the level of the renal veins). Numerous borderline enlarged and mildly enlarged retroperitoneal lymph nodes, largest of which is in the left para-aortic nodal station adjacent to the left renal hilum, likely reactive. Reproductive: Prostate gland and seminal vesicles are unremarkable in appearance. Other: Small volume of intermediate attenuation ascites, likely with proteinaceous or hemorrhagic contents. No pneumoperitoneum. Musculoskeletal: There are no aggressive appearing lytic or blastic lesions noted in the visualized portions of the skeleton. IMPRESSION: 1. Enlarging left renal subcapsular hematoma common now with extension into the perinephric soft tissues of the left retroperitoneum, and increasing compression of the left renal parenchyma. Clinical correlation for signs and symptoms of worsening renovascular hypertension is suggested. 2. Associated with this, there is now enlargement and high attenuation in the left adrenal gland, presumably from spontaneous adrenal hemorrhage. 3. Moderate left pleural effusion  associated with complete passive atelectasis of the visualized portions of the left lower lobe. In addition, there appears to be extensive airspace consolidation in the right lung base (most severe in the right lower lobe), which could reflect developing infection, areas of alveolar hemorrhage, or sequela of aspiration. 4. Additional incidental findings, as above. These results will be called to the ordering clinician or representative by the Radiologist Assistant, and communication documented in the PACS or zVision Dashboard. Electronically Signed   By: Vinnie Langton M.D.   On: 04/10/2015 09:40     Medications:   . pureflow 15,000 mL (04/08/15 1600)   . sodium chloride   Intravenous Once  . antiseptic oral rinse  7 mL Mouth Rinse BID  . HYDROmorphone PCA 0.3 mg/mL   Intravenous 6 times per day  . Influenza vac split quadrivalent PF  0.5 mL Intramuscular Tomorrow-1000  . pantoprazole (PROTONIX) IV  40 mg Intravenous Q12H   sodium chloride, sodium chloride, acetaminophen, alteplase, diphenhydrAMINE **OR** diphenhydrAMINE, heparin, heparin, HYDROmorphone (DILAUDID) injection, lidocaine (PF), lidocaine-prilocaine, naloxone **AND** sodium chloride, ondansetron (ZOFRAN) IV, oxyCODONE-acetaminophen, pentafluoroprop-tetrafluoroeth  Assessment/ Plan:  49 y.o. male with past medical history of hypertension, hyperlipidemia, anemia chronic kidney disease, peptic ulcer disease, CKD stage IV, anemia of CKD proteinuria, hyperkalemia s/p renal bioy 04/06/15 with subsequent left perinephric hematoma.  1.  Acute renal failure/CKD stage IV/Proteinuria:  Pt s/p left renal biopsy. Perinephric hematoma developed which was initially subcapsular, subsequently extracapsular bleeding noted.  Possible AA amyloid noted on prelim renal biopsy, but official final report still pending. -Suspect pt has reached ESRD even prior to embolization.  We are currently planning for embolization of left kidney, awaiting further  discussion with pt and Dr. Delana Meyer.   - No indication for dialysis today - monitor UOP, await final Kidney biopsy results  2.  Left perinephric hematoma:  Noted post biopsy, was initially subcapsular, now extracapsular.   -CT scan showed progression yesterday, planning for embolization of left kidney today, awaiting further input from Dr. Delana Meyer this AM.  3.  Post hemorrhagic anemia:  hgb trending down slowly Check iron studies with next labs  4.  Hyperkalemia:  K currently 3.4 and acceptable.       LOS: 5 Joseph [name] 10/17/20168:48 AM

## 2015-04-12 NOTE — Progress Notes (Signed)
[facility]  Daily Progress Note   Subjective  - 1 Day Post-Op status post embolization left kidney  Patient notes his pain is significantly improved and rather minimal at this time  Objective Filed Vitals:   04/12/15 1500 04/12/15 1605 04/12/15 1646 04/12/15 1748  BP: 137/83   111/73  Pulse: 99  101 104  Temp:   99 F (37.2 C) 98.4 F (36.9 C)  TempSrc:   Oral Oral  Resp: 12 20 15 16   Height:      Weight:      SpO2: 95% 91% 95% 90%    Intake/Output Summary (Last 24 hours) at 04/12/15 1922 Last data filed at 04/12/15 1605  Gross per 24 hour  Intake  268.5 ml  Output    305 ml  Net  -36.5 ml    PULM  Normal effort , no use of accessory muscles CV  No JVD, RRR Abd      No distended, nontender VASC  groin site clean dry and intact  Laboratory CBC    Component Value Date/Time   WBC 16.7* 04/12/2015 0418   WBC 8.1 04/06/2014 0754   HGB 7.0* 04/12/2015 1752   HGB 9.9* 04/06/2014 0754   HCT 21.0* 04/12/2015 1752   HCT 30.7* 04/06/2014 0754   PLT 158 04/12/2015 0418   PLT 273 04/06/2014 0754    BMET    Component Value Date/Time   NA 137 04/12/2015 0418   NA 138 04/12/2015 0418   NA 141 04/06/2014 0754   K 3.4* 04/12/2015 0418   K 3.5 04/12/2015 0418   K 5.2* 04/06/2014 0754   CL 99* 04/12/2015 0418   CL 100* 04/12/2015 0418   CL 111* 04/06/2014 0754   CO2 31 04/12/2015 0418   CO2 32 04/12/2015 0418   CO2 24 04/06/2014 0754   GLUCOSE 125* 04/12/2015 0418   GLUCOSE 129* 04/12/2015 0418   GLUCOSE 104* 04/06/2014 0754   BUN 39* 04/12/2015 0418   BUN 40* 04/12/2015 0418   BUN 39* 04/06/2014 0754   CREATININE 3.67* 04/12/2015 0418   CREATININE 3.66* 04/12/2015 0418   CREATININE 2.76* 04/06/2014 0754   CALCIUM 7.2* 04/12/2015 0418   CALCIUM 7.4* 04/12/2015 0418   CALCIUM 8.4* 04/06/2014 0754   GFRNONAA 18* 04/12/2015 0418   GFRNONAA 18* 04/12/2015 0418   GFRNONAA 26* 04/06/2014 0754   GFRAA 21* 04/12/2015 0418   GFRAA 21*  04/12/2015 0418   GFRAA 32* 04/06/2014 0754    Assessment/Planning: POD #1 s/p embolization of the left kidney   His hemoglobin is now stable and his pain is markedly reduced suggesting the hematomas been depressurized also indicating successful embolization. We'll continue to support his renal function nephrology will continue to dialyze the patient has needed    Katha Cabal  04/12/2015, 7:22 PM

## 2015-04-12 NOTE — Progress Notes (Signed)
Patient resting comfortably overnight, wife at bedside. PCA in use with positive relief. Patient resting between care, vital signs stable, occasionally ST with exertion. Patient continues to wear 4L [place]. Will continue to assess for changes/need.

## 2015-04-12 NOTE — Progress Notes (Signed)
Spoke with Dr Candiss Norse concerning lab results and oxygen sats. No new orders at this time. Will continue to monitor.

## 2015-04-13 ENCOUNTER — Inpatient Hospital Stay
Admission: AD | Admit: 2015-04-13 | Discharge: 2015-04-13 | Disposition: A | Discharge status: Home / Self Care | Payer: BLUE CROSS/BLUE SHIELD | Source: Ambulatory Visit | Attending: Nephrology | Admitting: Nephrology

## 2015-04-13 DIAGNOSIS — D509 Iron deficiency anemia, unspecified: Secondary | ICD-10-CM

## 2015-04-13 DIAGNOSIS — R5383 Other fatigue: Secondary | ICD-10-CM

## 2015-04-13 DIAGNOSIS — N186 End stage renal disease: Secondary | ICD-10-CM

## 2015-04-13 DIAGNOSIS — Z79899 Other long term (current) drug therapy: Secondary | ICD-10-CM

## 2015-04-13 DIAGNOSIS — D72829 Elevated white blood cell count, unspecified: Secondary | ICD-10-CM

## 2015-04-13 DIAGNOSIS — R531 Weakness: Secondary | ICD-10-CM

## 2015-04-13 DIAGNOSIS — E854 Organ-limited amyloidosis: Secondary | ICD-10-CM

## 2015-04-13 LAB — RENAL FUNCTION PANEL
ALBUMIN: 2.4 g/dL — AB (ref 3.5–5.0)
Anion gap: 9 (ref 5–15)
BUN: 60 mg/dL — AB (ref 6–20)
CALCIUM: 7.2 mg/dL — AB (ref 8.9–10.3)
CO2: 28 mmol/L (ref 22–32)
CREATININE: 5.18 mg/dL — AB (ref 0.61–1.24)
Chloride: 97 mmol/L — ABNORMAL LOW (ref 101–111)
GFR calc Af Amer: 14 mL/min — ABNORMAL LOW (ref >60)
GFR, EST NON AFRICAN AMERICAN: 12 mL/min — AB (ref >60)
GLUCOSE: 114 mg/dL — AB (ref 65–99)
PHOSPHORUS: 4.1 mg/dL (ref 2.5–4.6)
Potassium: 3.6 mmol/L (ref 3.5–5.1)
SODIUM: 134 mmol/L — AB (ref 135–145)

## 2015-04-13 LAB — TRANSFERRIN: Transferrin: 124 mg/dL — ABNORMAL LOW (ref 180–329)

## 2015-04-13 LAB — HEMOGLOBIN AND HEMATOCRIT, BLOOD
HCT: 20.1 % — ABNORMAL LOW (ref 40.0–52.0)
HCT: 27.3 % — ABNORMAL LOW (ref 40.0–52.0)
Hemoglobin: 6.6 g/dL — ABNORMAL LOW (ref 13.0–18.0)
Hemoglobin: 9.3 g/dL — ABNORMAL LOW (ref 13.0–18.0)

## 2015-04-13 LAB — SURGICAL PATHOLOGY

## 2015-04-13 LAB — MAGNESIUM: MAGNESIUM: 2 mg/dL (ref 1.7–2.4)

## 2015-04-13 LAB — PREPARE RBC (CROSSMATCH)

## 2015-04-13 MED ORDER — IRON SUCROSE 20 MG/ML IV SOLN
200.0000 mg | Freq: Once | INTRAVENOUS | Status: AC
  Administered 2015-04-13: 200 mg via INTRAVENOUS
  Filled 2015-04-13: qty 10

## 2015-04-13 MED ORDER — PANTOPRAZOLE SODIUM 40 MG PO TBEC
40.0000 mg | DELAYED_RELEASE_TABLET | Freq: Two times a day (BID) | ORAL | Status: DC
  Administered 2015-04-13 – 2015-04-21 (×16): 40 mg via ORAL
  Filled 2015-04-13 (×17): qty 1

## 2015-04-13 MED ORDER — ROSUVASTATIN CALCIUM 10 MG PO TABS
10.0000 mg | ORAL_TABLET | Freq: Every day | ORAL | Status: DC
  Administered 2015-04-13 – 2015-04-20 (×8): 10 mg via ORAL
  Filled 2015-04-13 (×10): qty 1

## 2015-04-13 NOTE — Progress Notes (Signed)
*  PRELIMINARY RESULTS* Echocardiogram 2D Echocardiogram has been performed.  Joseph [name] 04/13/2015, 5:09 PM

## 2015-04-13 NOTE — Consult Note (Signed)
[place]  [phone] [phone]  ID: Joseph [name] OB: [birth year]  MR#: [id number]  QHU#:[id number]  Patient Care Team: Pcp Not In System as PCP - General (Emergency Medicine)  CHIEF COMPLAINT: Amyloidosis of the kidney.  INTERVAL HISTORY: Patient is a 49 year old male with end-stage renal disease who recently had a kidney biopsy that was complicated by significant bleeding and hematoma. Preliminary biopsy results from his kidney revealed extensive amyloidosis. Currently, patient is at dialysis. He has increased weakness and fatigue, but otherwise feels well. He denies any fevers he does not complain of chest pain or shortness of breath. He has no neurologic complaints. He has a fair appetite. He denies any nausea, vomiting, constipation, or diarrhea. Patient otherwise feels well and offers no further specific complaints.  REVIEW OF SYSTEMS:   Review of Systems  Constitutional: Positive for malaise/fatigue. Negative for fever.  Cardiovascular: Negative.   Gastrointestinal: Negative.   Musculoskeletal: Negative.   Neurological: Positive for weakness.  Psychiatric/Behavioral: Negative.     As per HPI. Otherwise, a complete review of systems is negatve.  PAST MEDICAL HISTORY: Past Medical History  Diagnosis Date  . Allergy   . Heart murmur   . Hypertension     PAST SURGICAL HISTORY: Past Surgical History  Procedure Laterality Date  . Renal biopsy, percutaneous  04/06/2015         FAMILY HISTORY Family History  Problem Relation Age of Onset  . Hypertension Other        ADVANCED DIRECTIVES:    HEALTH MAINTENANCE: Social History  Substance Use Topics  . Smoking status: Former Smoker    Quit date: 11/04/2006  . Smokeless tobacco: None  . Alcohol Use: No     Colonoscopy:  PAP:  Bone density:  Lipid panel:  Allergies  Allergen Reactions  . Ciprofloxacin Hcl   . Doxycycline Hyclate     Current Facility-Administered  Medications  Medication Dose Route Frequency Provider Last Rate Last Dose  . 0.9 %  sodium chloride infusion  100 mL Intravenous PRN Munsoor Lateef, MD      . 0.9 %  sodium chloride infusion  100 mL Intravenous PRN Munsoor Lateef, MD 10 mL/hr at 04/11/15 0000 100 mL at 04/11/15 0000  . 0.9 %  sodium chloride infusion   Intravenous Once Katha Cabal, MD      . acetaminophen (TYLENOL) tablet 500-1,000 mg  500-1,000 mg Oral Q6H PRN Munsoor Lateef, MD      . antiseptic oral rinse (CPC / CETYLPYRIDINIUM CHLORIDE 0.05%) solution 7 mL  7 mL Mouth Rinse BID Wilhelmina Mcardle, MD   7 mL at 04/11/15 1000  . diphenhydrAMINE (BENADRYL) injection 12.5 mg  12.5 mg Intravenous Q6H PRN Charlett Nose, RPH       Or  . diphenhydrAMINE (BENADRYL) 12.5 MG/5ML elixir 12.5 mg  12.5 mg Oral Q6H PRN Charlett Nose, RPH   12.5 mg at 04/13/15 1508  . heparin injection 1,000-6,000 Units  1,000-6,000 Units CRRT PRN Munsoor Lateef, MD   1,000 Units at 04/09/15 0319  . HYDROmorphone (DILAUDID) 1 mg/mL PCA injection   Intravenous 6 times per day Charlett Nose, [facility]      . HYDROmorphone (DILAUDID) injection 1 mg  1 mg Intravenous Q2H PRN Lytle Butte, MD   1 mg at 04/10/15 4656  . Influenza vac split quadrivalent PF (FLUARIX) injection 0.5 mL  0.5 mL Intramuscular Tomorrow-1000 Munsoor Lateef, MD      . iron sucrose (VENOFER)  200 mg in sodium chloride 0.9 % 150 mL IVPB  200 mg Intravenous Once Murlean Iba, MD   200 mg at 04/13/15 1451  . naloxone [facility]) injection 0.4 mg  0.4 mg Intravenous PRN Charlett Nose, [facility]       And  . sodium chloride 0.9 % injection 9 mL  9 mL Intravenous PRN Charlett Nose, RPH      . ondansetron (ZOFRAN) 8 mg in sodium chloride 0.9 % 50 mL IVPB  8 mg Intravenous Q8H PRN Munsoor Lateef, MD      . ondansetron (ZOFRAN) injection 4 mg  4 mg Intravenous Q6H PRN Charlett Nose, RPH   4 mg at 04/12/15 1553  . oxyCODONE-acetaminophen (PERCOCET/ROXICET) 5-325 MG per tablet 1-2 tablet   1-2 tablet Oral Q4H PRN Munsoor Lateef, MD   1 tablet at 04/10/15 0803  . pantoprazole (PROTONIX) EC tablet 40 mg  40 mg Oral BID AC Crystal G Scarpena, RPH   40 mg at 04/13/15 1456  . rosuvastatin (CRESTOR) tablet 10 mg  10 mg Oral q1800 Vaughan Basta, MD        OBJECTIVE: Filed Vitals:   04/13/15 1428  BP:   Pulse:   Temp:   Resp: 18     Body mass index is 30.42 kg/(m^2).    ECOG FS:1 - Symptomatic but completely ambulatory  General: Well-developed, well-nourished, no acute distress. Eyes: Pink conjunctiva, anicteric sclera. HEENT: Normocephalic, moist mucous membranes, clear oropharnyx. Lungs: Clear to auscultation bilaterally. Heart: Regular rate and rhythm. No rubs, murmurs, or gallops. Abdomen: Soft, nontender, nondistended. No organomegaly noted, normoactive bowel sounds. Musculoskeletal: No edema, cyanosis, or clubbing. Neuro: Alert, answering all questions appropriately. Cranial nerves grossly intact. Skin: No rashes or petechiae noted. Psych: Normal affect. Lymphatics: No cervical, calvicular, axillary or inguinal LAD.   LAB RESULTS:  Lab Results  Component Value Date   NA 134* 04/13/2015   K 3.6 04/13/2015   CL 97* 04/13/2015   CO2 28 04/13/2015   GLUCOSE 114* 04/13/2015   BUN 60* 04/13/2015   CREATININE 5.18* 04/13/2015   CALCIUM 7.2* 04/13/2015   PROT 5.4* 04/12/2015   ALBUMIN 2.4* 04/13/2015   AST 32 04/12/2015   ALT 119* 04/12/2015   ALKPHOS 195* 04/12/2015   BILITOT 4.1* 04/12/2015   GFRNONAA 12* 04/13/2015   GFRAA 14* 04/13/2015    Lab Results  Component Value Date   WBC 16.7* 04/12/2015   NEUTROABS 5.6 04/05/2015   HGB 6.6* 04/13/2015   HCT 20.1* 04/13/2015   MCV 86.0 04/12/2015   PLT 158 04/12/2015     STUDIES: Ct Abdomen Pelvis Wo Contrast  04/10/2015  CLINICAL DATA:  49 year old male with acute renal failure superimposed on chronic kidney disease. Status post recent renal biopsy complicated by subcapsular hematoma. Acute  onset of diaphoresis and intense left-sided abdominal pain 1 hour ago. EXAM: CT ABDOMEN AND PELVIS WITHOUT CONTRAST TECHNIQUE: Multidetector CT imaging of the abdomen and pelvis was performed following the standard protocol without IV contrast. COMPARISON:  CT the abdomen and pelvis without contrast 04/07/2015. FINDINGS: Lower chest: Small right and moderate left pleural effusions. The left pleural effusion is associated with complete atelectasis of the visualized portions of the left lower lobe. There are patchy areas of ground-glass attenuation in the right lung base, most evident in the right lower lobe, concerning for either developing infection, hemorrhage or sequela of recent aspiration. Hepatobiliary: No definite cystic or solid hepatic lesions are identified within the liver on today's noncontrast CT  examination. Unenhanced appearance of the gallbladder is normal. Pancreas: No pancreatic mass or definite pancreatic/ peripancreatic fluid collections identified on today's noncontrast CT examination. Spleen: Unremarkable. Adrenals/Urinary Tract: Significant interval enlargement of the previously noted subcapsular hematoma, which now appears to be extending into the perinephric soft tissues. This currently measures up to 5.1 cm in thickness (previously 1.8 cm), and is associated with increasing compression of the left renal parenchyma. Other than an exophytic a low-attenuation lesion in the upper pole of the right kidney (which is incompletely characterized, but likely a cyst), the right kidney normal in appearance on today's noncontrast CT examination. Right adrenal gland is normal in appearance. Left adrenal gland appears enlarged, and is high attenuation, suggesting recent adrenal hemorrhage. No hydroureteronephrosis. Urinary bladder is nearly completely decompressed with a Foley balloon catheter in place. Stomach/Bowel: Unenhanced appearance of the stomach is normal. No pathologic dilatation of small bowel  or colon. Vascular/Lymphatic: Atherosclerotic calcifications throughout the abdominal and pelvic vasculature, without definite aneurysm. Arm catheter injuring the right femoral vein extending into the IVC (terminating below the level of the renal veins). Numerous borderline enlarged and mildly enlarged retroperitoneal lymph nodes, largest of which is in the left para-aortic nodal station adjacent to the left renal hilum, likely reactive. Reproductive: Prostate gland and seminal vesicles are unremarkable in appearance. Other: Small volume of intermediate attenuation ascites, likely with proteinaceous or hemorrhagic contents. No pneumoperitoneum. Musculoskeletal: There are no aggressive appearing lytic or blastic lesions noted in the visualized portions of the skeleton. IMPRESSION: 1. Enlarging left renal subcapsular hematoma common now with extension into the perinephric soft tissues of the left retroperitoneum, and increasing compression of the left renal parenchyma. Clinical correlation for signs and symptoms of worsening renovascular hypertension is suggested. 2. Associated with this, there is now enlargement and high attenuation in the left adrenal gland, presumably from spontaneous adrenal hemorrhage. 3. Moderate left pleural effusion associated with complete passive atelectasis of the visualized portions of the left lower lobe. In addition, there appears to be extensive airspace consolidation in the right lung base (most severe in the right lower lobe), which could reflect developing infection, areas of alveolar hemorrhage, or sequela of aspiration. 4. Additional incidental findings, as above. These results will be called to the ordering clinician or representative by the Radiologist Assistant, and communication documented in the PACS or zVision Dashboard. Electronically Signed   By: Vinnie Langton M.D.   On: 04/10/2015 09:40   Ct Abdomen Pelvis Wo Contrast  04/07/2015  CLINICAL DATA:  Decreased blood  pressure. Flank pain. Left renal biopsy yesterday. EXAM: CT ABDOMEN AND PELVIS WITHOUT CONTRAST TECHNIQUE: Multidetector CT imaging of the abdomen and pelvis was performed following the standard protocol without IV contrast. COMPARISON:  04/06/2015 FINDINGS: New development of small left pleural effusion with atelectasis in the left lung base. Linear atelectasis in both lung bases. Probable focal calcification in the left pleural space. Near circumferential acute appearing subcapsular hematoma around the left kidney. Maximal depth measures about 2.5 cm. Hematoma appears slightly smaller than on previous study. There is hemorrhage and infiltration diffusely around the left perirenal fat. This is also similar to prior study. Moderately enlarged lymph nodes in the retroperitoneum and periaortic region. These are nonspecific and could be reactive or metastatic. There is progression of hematoma and infiltration into the upper aspect of the retroperitoneum and in the gastrohepatic ligament as well as around the crus of the diaphragm and posterior subdiaphragmatic space since previous study. Small amount of new free fluid along the  pericolic gutters and around the liver edge suggesting development of intraperitoneal hemorrhage. Small amount of fluid in the pelvis. The unenhanced appearance of the liver, spleen, gallbladder, pancreas, inferior vena cava are unremarkable. Calcification of the abdominal aorta without aneurysm. Exophytic cyst off the right kidney. No hydronephrosis in either kidney. Calcification of the abdominal aorta without aneurysm. Stomach, small bowel, and colon are not abnormally distended. Residual contrast material in the colon. No free air in the abdomen. Right adrenal gland is not enlarged. Left adrenal gland is obscured by the hemorrhage. Pelvis: Prostate gland appears mildly enlarged. Bladder wall is not thickened. No loculated pelvic fluid collections. No pelvic mass or lymphadenopathy. Appendix  is not identified. No evidence of diverticulitis. With no destructive bone lesions. IMPRESSION: Moderately large subcapsular hematoma around the left kidney appears about the same as yesterday. Hemorrhagic stranding in the left perirenal fat is about the same. There is increasing hemorrhage in the retroperitoneum, celiac axis, gastrohepatic ligament, posterior subdiaphragmatic space, and with development of free fluid, likely intraperitoneal hemorrhage around the liver and extending into the pelvis. Enlarged retroperitoneal lymph nodes similar to previous study. New left pleural effusion with basilar atelectasis. These results were called by telephone at the time of interpretation on 04/07/2015 at 6:24 am to Dr. Anthonette Legato , who verbally acknowledged these results. Electronically Signed   By: Lucienne Capers M.D.   On: 04/07/2015 06:31   Ct Abdomen Pelvis Wo Contrast  04/06/2015  CLINICAL DATA:  Left sided severe pain after percutaneous renal biopsy today EXAM: CT ABDOMEN AND PELVIS WITHOUT CONTRAST TECHNIQUE: Multidetector CT imaging of the abdomen and pelvis was performed following the standard protocol without IV contrast. COMPARISON:  Ultrasound from earlier the same day FINDINGS: There is a moderately large mixed attenuation left subcapsular hematoma around the kidney laterally from upper to lower pole, measuring approximately 3.2 cm maximum thickness, resulting in mild compression of the left renal parenchyma. There are streaky left perinephric retroperitoneal opacities, extending primarily medially and superiorly toward the adrenal gland. Visualized lung bases clear. Unremarkable liver, gallbladder, spleen, pancreas, adrenal glands. Unenhanced CT was performed per clinician order. Lack of IV contrast limits sensitivity and specificity, especially for evaluation of abdominal/pelvic solid viscera. There is a fluid attenuation exophytic 3.9 lesion from the upper pole right kidney presumably renal cyst  but incompletely characterized. No hydronephrosis. Stomach, small bowel, and colon are nondilated. Urinary bladder physiologically distended. Mild prostatic enlargement. Bilateral pelvic phleboliths. Scattered aortoiliac arterial plaque without aneurysm. Increased number of prominent para-aortic and aortocaval lymph nodes, none greater than 1 cm short axis diameter. No ascites. No free air. Minimal spondylitic changes in the lumbar spine. Prominent protrusion L1-2. IMPRESSION: 1. Moderately large left subcapsular hematoma with mild compression of the left renal parenchyma. 2. Para-aortic and aortocaval retroperitoneal adenopathy, possibly reactive but nonspecific ; lymphoproliferative disease could have similar appearance. Critical Value/emergent results were called by telephone at the time of interpretation on 04/06/2015 at 3:11 pm to Dr. Inocente Salles LATEEF , who verbally acknowledged these results. Electronically Signed   By: Lucrezia Europe M.D.   On: 04/06/2015 15:13   Dg Chest 2 View  04/12/2015  CLINICAL DATA:  Followup hypoxia. EXAM: CHEST  2 VIEW COMPARISON:  None FINDINGS: Normal heart size. There is a large left pleural effusion. This is similar to 04/10/2015. No right pleural fluid noted. The right lung is clear. IMPRESSION: 1. Large left pleural effusion. Electronically Signed   By: Kerby Moors M.D.   On: 04/12/2015 16:07  US Biopsy-no Radiologist  04/06/2015  CLINICAL DATA:  Ultrasound was utilized for biopsy by the requesting physician.    ASSESSMENT: Renal amyloidosis.  PLAN:    1. Amyloidosis: Preliminary renal biopsy "does not show evidence for AL amyloid with negative staining for kappa and lambda light chains. Congo red staining, electron microscopy and additional studies to try to identify the molecular origin of the amyloid are pending." This is possible AA amyloid.  Will complete workup to assess for systemic amyloid including a cardiac echo and bone marrow biopsy. Patient had a  negative SPEP and UPEP in February of this year, but will repeat for completeness. Once workup is complete, patient can follow-up as an outpatient for further evaluation, discussion of his results, and treatment planning. 2. Anemia: Multifactorial secondary to bleed, end-stage renal disease, and iron deficiency. Continue to transfuse to maintain hemoglobin above 7.0. Patient received a dose of IV Venofer today. Bone marrow biopsy as above. 3. Leukocytosis: Likely reactive, monitor.  Case was discussed at length with Dr. Candiss Norse.  Appreciate consult, will follow.  Patient expressed understanding and was in agreement with this plan. He also understands that He can call clinic at any time with any questions, concerns, or complaints.   No matching staging information was found for the patient.  Lloyd Huger, MD   04/13/2015 3:26 PM

## 2015-04-13 NOTE — Progress Notes (Signed)
HD tx start 

## 2015-04-13 NOTE — Progress Notes (Signed)
[place] [facility] [place] NAME: Joseph [name]    MR#:  [id number]  DATE OF BIRTH:  08-09-1965  SUBJECTIVE:  CHIEF COMPLAINT:  No chief complaint on file. pain under control with PCA drip, s/p embolization, Hb stable, on 4 ltr oxygen. No cough or sputum.   REVIEW OF SYSTEMS:  CONSTITUTIONAL: No fever, generalized weakness.  EYES: No blurred or double vision.  EARS, NOSE, AND THROAT: No tinnitus or ear pain.  RESPIRATORY: No cough, shortness of breath, wheezing or hemoptysis.  CARDIOVASCULAR: No chest pain, orthopnea, edema.  GASTROINTESTINAL: has nausea, no vomiting, diarrhea, but has abdominal pain.  GENITOURINARY: No dysuria, hematuria.  ENDOCRINE: No polyuria, nocturia,  HEMATOLOGY: No anemia, easy bruising or bleeding SKIN: No rash or lesion. MUSCULOSKELETAL: Worsening back pain.   NEUROLOGIC: No tingling, numbness, weakness.  PSYCHIATRY: No anxiety or depression.   DRUG ALLERGIES:   Allergies  Allergen Reactions  . Ciprofloxacin Hcl   . Doxycycline Hyclate     VITALS:  Blood pressure 123/80, pulse 89, temperature 99.3 F (37.4 C), temperature source Oral, resp. rate 19, height 5\' 11"  (1.803 m), weight 93.985 kg (207 lb 3.2 oz), SpO2 100 %.  PHYSICAL EXAMINATION:  GENERAL:  49 y.o.-year-old patient lying in the bed with no acute distress.  EYES: Pupils equal, round, reactive to light and accommodation. No scleral icterus. Extraocular muscles intact.  HEENT: Head atraumatic, normocephalic. Oropharynx and nasopharynx clear. Moist oral mucosa. NECK:  Supple, no jugular venous distention. No thyroid enlargement, no tenderness.  LUNGS: Normal breath sounds bilaterally, no wheezing, rales,rhonchi or crepitation. No use of accessory muscles of respiration.  CARDIOVASCULAR: S1, S2 normal. No murmurs, rubs, or gallops.  ABDOMEN: Soft, nontender, nondistended. Bowel sounds present. No organomegaly or mass.  EXTREMITIES: No pedal  edema, cyanosis, or clubbing.  NEUROLOGIC: Cranial nerves II through XII are intact. Muscle strength 4/5 in all extremities. Sensation intact. Gait not checked.  PSYCHIATRIC: The patient is alert and oriented x 3.  SKIN: No obvious rash, lesion, or ulcer.    LABORATORY PANEL:   CBC  Recent Labs Lab 04/12/15 0418  04/13/15 0501  WBC 16.7*  --   --   HGB 7.2*  < > 6.6*  HCT 21.4*  < > 20.1*  PLT 158  --   --   < > = values in this interval not displayed. ------------------------------------------------------------------------------------------------------------------  Chemistries   Recent Labs Lab 04/12/15 0418 04/13/15 0501  NA 137  138 134*  K 3.4*  3.5 3.6  CL 99*  100* 97*  CO2 31  32 28  GLUCOSE 125*  129* 114*  BUN 39*  40* 60*  CREATININE 3.67*  3.66* 5.18*  CALCIUM 7.2*  7.4* 7.2*  MG 2.0 2.0  AST 32  --   ALT 119*  --   ALKPHOS 195*  --   BILITOT 4.1*  --    ------------------------------------------------------------------------------------------------------------------  Cardiac Enzymes No results for input(s): TROPONINI in the last 168 hours. ------------------------------------------------------------------------------------------------------------------  RADIOLOGY:  Dg Chest 2 View  04/12/2015  CLINICAL DATA:  Followup hypoxia. EXAM: CHEST  2 VIEW COMPARISON:  None FINDINGS: Normal heart size. There is a large left pleural effusion. This is similar to 04/10/2015. No right pleural fluid noted. The right lung is clear. IMPRESSION: 1. Large left pleural effusion. Electronically Signed   By: Kerby Moors M.D.   On: 04/12/2015 16:07    EKG:   Orders placed or performed in visit on 04/06/14  .  EKG 12-Lead    ASSESSMENT AND PLAN:   * Acute renal failure/CKD stage IV progressed to ESRD.  Awaiting final diagnosis on type or reason of renal failure from nephrology team. He got CRRT for 3 days and need HD. Per Dr. Holley Raring , Will hold off on HD  for now. Continue to monitor electrolytes and UOP closely.Possible AA amyloid noted on prelim renal biopsy, but official final report still pending per Dr. Holley Raring.  *  left scapsular hematoma after renal biopsy.  Perinephric hematoma developed which was initially subcapsular, later with extracapsular bleeding. Per CT abd worsening of hematoma, along with possible adrenal gland hemorrhage.  Hb stable after  embolization of left kidney by vascular on 04/11/15.  * Hyperkalemia. Improved.  *  Hpotension: improved.  *Acute blood loss anemia due to 1. Hb increased from 6.6 to 7.5 after 2 unit PRBC transfusion.    Hb stable now.  * Hypomagnesemia. Mag supplement. Improved.  * Leukocytosis. Possible due to hematoma. F/u CBC.  * Thrombocytopenia. Improved after given platelet transfusion and vitamin K. He got 20 mg of DDAVP IV twice per Dr. Isidore Moos. follow-up CBC.  * Hypoxia and tachycardia, due to symptomatic anemia/ESRD. On O2 [place] 4L.   Some atelactesis on X ray and CT of chest- encouraged incentive spirometry.  I discussed with Dr. Candiss Norse and pt's mother present in room.  All the records are reviewed and case discussed with Care Management/Social Workerr. Management plans discussed with the patient, his wife and mother, and they are in agreement.  CODE STATUS: Full code.  TOTAL TIME TAKING CARE OF THIS PATIENT: 30 minutes.   POSSIBLE D/C IN >3 DAYS, DEPENDING ON CLINICAL CONDITION.   Vaughan Basta M.D on 04/13/2015 at 8:21 AM  Between 7am to 6pm - Pager - [phone]  After 6pm go to [email or web address] - password [email or web address] [facility]  Office  [phone]  CC: Primary care physician; Pcp Not In System

## 2015-04-13 NOTE — Progress Notes (Signed)
Pre-hd tx 

## 2015-04-13 NOTE — Progress Notes (Signed)
Discussed with Dr. Anselm Jungling, reviewed CXR, CT chest. Persistent left pleural effusion, with some loculations. Suspect that there is likely some blood extravasated from the abdominal cavity as well as atelectasis. Will monitor, may require thoracentesis.

## 2015-04-13 NOTE — Progress Notes (Signed)
HD tx completed.

## 2015-04-13 NOTE — Progress Notes (Signed)
Post hd tx 

## 2015-04-13 NOTE — Progress Notes (Signed)
Subjective:  Had difficulty sleeping at night Reports of desaturation with exertion. Sats dropped to 80% but sustained above 95% with oxygen supplementation Serum creatinine is higher at 5.18 today Iron saturation low at 6% Hemoglobin has decreased to 6.6 .   Objective:  Vital signs in last 24 hours:  Temp:  [98.1 F (36.7 C)-99.9 F (37.7 C)] 98.1 F (36.7 C) (10/18 1200) Pulse Rate:  [87-104] 88 (10/18 1200) Resp:  [8-24] 14 (10/18 1200) BP: (109-147)/(69-93) 136/86 mmHg (10/18 1200) SpO2:  [88 %-100 %] 94 % (10/18 1200) FiO2 (%):  [94 %] 94 % (10/18 0830) Weight:  [93.9 kg (207 lb 0.2 oz)-93.985 kg (207 lb 3.2 oz)] 93.9 kg (207 lb 0.2 oz) (10/18 0945)  Weight change: -2.615 kg (-5 lb 12.2 oz) Filed Weights   04/12/15 0428 04/13/15 0700 04/13/15 0945  Weight: 96.6 kg (212 lb 15.4 oz) 93.985 kg (207 lb 3.2 oz) 93.9 kg (207 lb 0.2 oz)    Intake/Output: I/O last 3 completed shifts: In: 837.5 [P.O.:460; I.V.:377.5] Out: 605 [Urine:605]   Intake/Output this shift:  Total I/O In: 550 [P.O.:200; Blood:350] Out: 400 [Urine:400]  Physical Exam: General: No acute distress  Head: Normocephalic, atraumatic. Moist oral mucosal membranes  Eyes: Anicteric, pallor noted  Neck: Supple, trachea midline  Lungs:  Clear to auscultation normal effort, O2  supplementation  Heart: Regular rate and rhythm no rubs  Abdomen:  Soft, mild left sided tenderness, no rebound, BS present  Extremities: no peripheral edema.  Neurologic: Nonfocal, moving all four extremities  Skin: No lesions, Some icteris notes  Access: Right femoral temp HD catheter.    Basic Metabolic Panel:  Recent Labs Lab 04/09/15 0431 04/09/15 0432 04/10/15 0448 04/11/15 0430 04/12/15 0418 04/13/15 0501  NA  --  142 139 142 137  138 134*  K  --  4.5 3.9 4.2 3.4*  3.5 3.6  CL  --  108 100* 103 99*  100* 97*  CO2  --  30 31 30 31   32 28  GLUCOSE  --  119* 112* 122* 125*  129* 114*  BUN  --  30* 27* 54*  39*  40* 60*  CREATININE  --  2.94* 2.89* 4.68* 3.67*  3.66* 5.18*  CALCIUM  --  8.3* 7.7* 7.5* 7.2*  7.4* 7.2*  MG 1.7  --  1.8 1.9 2.0 2.0  PHOS  --  2.6 2.3* 4.8* 3.4 4.1    Liver Function Tests:  Recent Labs Lab 04/07/15 0439  04/09/15 0432 04/10/15 0448 04/11/15 0430 04/12/15 0418 04/13/15 0501  AST 223*  --   --   --   --  32  --   ALT 380*  --   --   --   --  119*  --   ALKPHOS 92  --   --   --   --  195*  --   BILITOT 0.4  --   --   --   --  4.1*  --   PROT 6.2*  --   --   --   --  5.4*  --   ALBUMIN 3.4*  < > 2.9* 2.6* 2.6* 2.5*  2.5* 2.4*  < > = values in this interval not displayed. No results for input(s): LIPASE, AMYLASE in the last 168 hours. No results for input(s): AMMONIA in the last 168 hours.  CBC:  Recent Labs Lab 04/09/15 0431 04/09/15 1607 04/10/15 0448  04/11/15 0430  04/11/15 2146 04/12/15 0418 04/12/15 1752  04/12/15 2125 04/13/15 0501  WBC 17.4* 12.5* 18.1*  --  17.3*  --   --  16.7*  --   --   --   HGB 7.5* 6.7* 9.1*  < > 6.8*  < > 7.3* 7.2* 7.0* 6.7* 6.6*  HCT 22.7* 20.6* 26.6*  < > 20.1*  < > 21.6* 21.4* 21.0* 20.5* 20.1*  MCV 86.2 85.6 86.2  --  85.9  --   --  86.0  --   --   --   PLT 57* 89* 92*  --  158  --   --  158  --   --   --   < > = values in this interval not displayed.  Cardiac Enzymes: No results for input(s): CKTOTAL, CKMB, CKMBINDEX, TROPONINI in the last 168 hours.  BNP: Invalid input(s): POCBNP  CBG:  Recent Labs Lab 04/08/15 2333 04/09/15 0411 04/09/15 0745 04/09/15 1152 04/09/15 1617  GLUCAP 103* 110* 110* 164* 113*    Microbiology: Results for orders placed or performed during the hospital encounter of 04/06/15  MRSA PCR Screening     Status: None   Collection Time: 04/07/15  9:49 AM  Result Value Ref Range Status   MRSA by PCR NEGATIVE NEGATIVE Final    Comment:        The GeneXpert MRSA Assay (FDA approved for NASAL specimens only), is one component of a comprehensive MRSA  colonization surveillance program. It is not intended to diagnose MRSA infection nor to guide or monitor treatment for MRSA infections.     Coagulation Studies:  Recent Labs  04/11/15 0430  LABPROT 16.4*  INR 1.30    Urinalysis: No results for input(s): COLORURINE, LABSPEC, PHURINE, GLUCOSEU, HGBUR, BILIRUBINUR, KETONESUR, PROTEINUR, UROBILINOGEN, NITRITE, LEUKOCYTESUR in the last 72 hours.  Invalid input(s): APPERANCEUR    Imaging: Dg Chest 2 View  04/12/2015  CLINICAL DATA:  Followup hypoxia. EXAM: CHEST  2 VIEW COMPARISON:  None FINDINGS: Normal heart size. There is a large left pleural effusion. This is similar to 04/10/2015. No right pleural fluid noted. The right lung is clear. IMPRESSION: 1. Large left pleural effusion. Electronically Signed   By: Kerby Moors M.D.   On: 04/12/2015 16:07     Medications:     . sodium chloride   Intravenous Once  . antiseptic oral rinse  7 mL Mouth Rinse BID  . HYDROmorphone   Intravenous 6 times per day  . Influenza vac split quadrivalent PF  0.5 mL Intramuscular Tomorrow-1000  . iron sucrose  200 mg Intravenous Once  . pantoprazole  40 mg Oral BID AC   sodium chloride, sodium chloride, acetaminophen, diphenhydrAMINE **OR** diphenhydrAMINE, heparin, HYDROmorphone (DILAUDID) injection, naloxone **AND** sodium chloride, ondansetron (ZOFRAN) IV, ondansetron (ZOFRAN) IV, oxyCODONE-acetaminophen  Assessment/ Plan:  49 y.o. male with past medical history of hypertension, hyperlipidemia, anemia chronic kidney disease, peptic ulcer disease, CKD stage IV, anemia of CKD proteinuria, hyperkalemia s/p renal bioy 04/06/15 with subsequent left perinephric hematoma.  1.  Acute renal failure/CKD stage IV/Proteinuria:  Pt s/p left renal biopsy. Perinephric hematoma developed which was initially subcapsular, subsequently extracapsular bleeding noted.  Possible AA amyloid noted on prelim renal biopsy, but official final report still  pending. -Suspect pt has reached ESRD even prior to embolization.  - monitor UOP, await final Kidney biopsy results - Hematology consult for AA amyloid - case discussed with patient's wife and mother  2.  Left perinephric hematoma:  Noted post biopsy, was initially subcapsular, now extracapsular.   -  s/p embolization of left kidney (done on Sunday)  3.  Post hemorrhagic anemia:  hgb trending down slowly Iron deficiency- iv iron with HD - 2 units blood transfusion with HD today  4.  Hyperkalemia:  K currently 3.4 and acceptable.  - encourage regular diet      LOS: 6 Elisa Sorlie 10/18/201612:32 PM

## 2015-04-13 NOTE — Progress Notes (Signed)
[facility] NAME: Joseph [name]    MR#:  [id number]  DATE OF BIRTH:  July 14, 1965  SUBJECTIVE:  CHIEF COMPLAINT:  No chief complaint on file. pain under control with PCA drip, s/p embolization, Hb dropped again today, . No cough or sputum. More hypoxic- requiring 5 ltr oxygen now.   REVIEW OF SYSTEMS:  CONSTITUTIONAL: No fever, generalized weakness.  EYES: No blurred or double vision.  EARS, NOSE, AND THROAT: No tinnitus or ear pain.  RESPIRATORY: No cough, shortness of breath, wheezing or hemoptysis.  CARDIOVASCULAR: No chest pain, orthopnea, edema.  GASTROINTESTINAL: has nausea, no vomiting, diarrhea, but has abdominal pain.  GENITOURINARY: No dysuria, hematuria.  ENDOCRINE: No polyuria, nocturia,  HEMATOLOGY: No anemia, easy bruising or bleeding SKIN: No rash or lesion. MUSCULOSKELETAL: Worsening back pain.   NEUROLOGIC: No tingling, numbness, weakness.  PSYCHIATRY: No anxiety or depression.   DRUG ALLERGIES:   Allergies  Allergen Reactions  . Ciprofloxacin Hcl   . Doxycycline Hyclate     VITALS:  Blood pressure 130/83, pulse 95, temperature 98.8 F (37.1 C), temperature source Oral, resp. rate 18, height $RemoveBe'5\' 11"'tidhJdhbx$  (1.803 m), weight 98.884 kg (218 lb), SpO2 92 %.  PHYSICAL EXAMINATION:  GENERAL:  49 y.o.-year-old patient lying in the bed with no acute distress.  EYES: Pupils equal, round, reactive to light and accommodation. No scleral icterus. Extraocular muscles intact.  HEENT: Head atraumatic, normocephalic. Oropharynx and nasopharynx clear. Moist oral mucosa. NECK:  Supple, no jugular venous distention. No thyroid enlargement, no tenderness.  LUNGS: decreased breath sound on left side, no wheezing, rales,rhonchi or crepitation. No use of accessory muscles of respiration.  CARDIOVASCULAR: S1, S2 normal. No murmurs, rubs, or gallops.  ABDOMEN: Soft, nontender, nondistended. Bowel sounds present. No organomegaly or  mass.  EXTREMITIES: No pedal edema, cyanosis, or clubbing.  NEUROLOGIC: Cranial nerves II through XII are intact. Muscle strength 4/5 in all extremities. Sensation intact. Gait not checked.  PSYCHIATRIC: The patient is alert and oriented x 3.  SKIN: No obvious rash, lesion, or ulcer.    LABORATORY PANEL:   CBC  Recent Labs Lab 04/12/15 0418  04/13/15 1505  WBC 16.7*  --   --   HGB 7.2*  < > 9.3*  HCT 21.4*  < > 27.3*  PLT 158  --   --   < > = values in this interval not displayed. ------------------------------------------------------------------------------------------------------------------  Chemistries   Recent Labs Lab 04/12/15 0418 04/13/15 0501  NA 137  138 134*  K 3.4*  3.5 3.6  CL 99*  100* 97*  CO2 31  32 28  GLUCOSE 125*  129* 114*  BUN 39*  40* 60*  CREATININE 3.67*  3.66* 5.18*  CALCIUM 7.2*  7.4* 7.2*  MG 2.0 2.0  AST 32  --   ALT 119*  --   ALKPHOS 195*  --   BILITOT 4.1*  --    ------------------------------------------------------------------------------------------------------------------  Cardiac Enzymes No results for input(s): TROPONINI in the last 168 hours. ------------------------------------------------------------------------------------------------------------------  RADIOLOGY:  Dg Chest 2 View  04/12/2015  CLINICAL DATA:  Followup hypoxia. EXAM: CHEST  2 VIEW COMPARISON:  None FINDINGS: Normal heart size. There is a large left pleural effusion. This is similar to 04/10/2015. No right pleural fluid noted. The right lung is clear. IMPRESSION: 1. Large left pleural effusion. Electronically Signed   By: Kerby Moors M.D.   On: 04/12/2015 16:07     ASSESSMENT AND PLAN:   *  Acute renal failure/CKD stage IV progressed to ESRD. Due to Amyloid- as per primary biopsy report. He got CRRT for 3 days and need HD. Per Dr. Holley Raring ,  on HD for now. Continue to monitor electrolytes and UOP closely.Possible AA amyloid noted on prelim  renal biopsy, but official final report still pending per Dr. Candiss Norse.  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    Bone marrow biopsy planned by him.  *  left scapsular hematoma after renal biopsy.  Perinephric hematoma developed which was initially subcapsular, later with extracapsular bleeding. Per CT abd worsening of hematoma, along with possible adrenal gland hemorrhage.   embolization of left kidney by vascular on 04/11/15.  * Hyperkalemia. Improved.  *  Hpotension: improved.  *Acute blood loss anemia due to 1. Hb increased from 6.6 to 7.5 after 2 unit PRBC transfusion.    Hb dropped again- 2 more units PRBC transfusion on 04/13/15.  * Hypomagnesemia. Mag supplement. Improved.  * Leukocytosis. Possible due to hematoma. F/u CBC.  * Thrombocytopenia. Improved after given platelet transfusion and vitamin K. He got 20 mg of DDAVP IV twice per Dr. Isidore Moos. follow-up CBC.  * Hypoxia and tachycardia, due to symptomatic anemia/ESRD. On O2 [place] 5 L.   Some atelactesis on X ray and CT of chest- encouraged incentive spirometry.  Xray reported large pleural effusion, Spoke to Pulm and Nephro, trial of lasix for now, may need Thorocentesis if not resolved.  I discussed with Dr. Candiss Norse and pt's mother present in room.  All the records are reviewed and case discussed with Care Management/Social Workerr. Management plans discussed with the patient, his wife and mother, and they are in agreement.  CODE STATUS: Full code.  TOTAL TIME TAKING CARE OF THIS PATIENT: 40 minutes.   POSSIBLE D/C IN >3 DAYS, DEPENDING ON CLINICAL CONDITION.   Vaughan Basta M.D on 04/13/2015 at 4:38 PM  Between 7am to 6pm - Pager - [phone]  After 6pm go to [email or web address] - password [email or web address] [facility]  Office  [phone]  CC: Primary care physician; Pcp Not In System

## 2015-04-13 NOTE — Progress Notes (Signed)
  CONCERNING: IV to Oral Route Change Policy  RECOMMENDATION: This patient is receiving pantoprazole by the intravenous route.  Based on criteria approved by the Pharmacy and Therapeutics Committee, the intravenous medication(s) is/are being converted to the equivalent oral dose form(s).   DESCRIPTION: These criteria include:  The patient is eating (either orally or via tube) and/or has been taking other orally administered medications for a least 24 hours  The patient has no evidence of active gastrointestinal bleeding or impaired GI absorption (gastrectomy, short bowel, patient on TNA or NPO).  If you have questions about this conversion, please contact the Pharmacy Department  []   [phone] )  [facility] [x]   [phone] )  [facility] []   [phone] )  [facility] []   [phone] )  [facility] []   [phone] )  [place], [facility] 04/13/2015 8:53 AM

## 2015-04-14 ENCOUNTER — Inpatient Hospital Stay: Payer: BLUE CROSS/BLUE SHIELD

## 2015-04-14 ENCOUNTER — Encounter: Payer: Self-pay | Admitting: Vascular Surgery

## 2015-04-14 ENCOUNTER — Encounter: Payer: Self-pay | Admitting: Nephrology

## 2015-04-14 LAB — MAGNESIUM: Magnesium: 2 mg/dL (ref 1.7–2.4)

## 2015-04-14 LAB — RENAL FUNCTION PANEL
Albumin: 2.5 g/dL — ABNORMAL LOW (ref 3.5–5.0)
Anion gap: 7 (ref 5–15)
BUN: 43 mg/dL — AB (ref 6–20)
CHLORIDE: 100 mmol/L — AB (ref 101–111)
CO2: 31 mmol/L (ref 22–32)
CREATININE: 4.04 mg/dL — AB (ref 0.61–1.24)
Calcium: 7.4 mg/dL — ABNORMAL LOW (ref 8.9–10.3)
GFR calc Af Amer: 18 mL/min — ABNORMAL LOW (ref >60)
GFR calc non Af Amer: 16 mL/min — ABNORMAL LOW (ref >60)
GLUCOSE: 99 mg/dL (ref 65–99)
POTASSIUM: 3.5 mmol/L (ref 3.5–5.1)
Phosphorus: 3.1 mg/dL (ref 2.5–4.6)
Sodium: 138 mmol/L (ref 135–145)

## 2015-04-14 LAB — GLUCOSE, CAPILLARY: Glucose-Capillary: 121 mg/dL — ABNORMAL HIGH (ref 65–99)

## 2015-04-14 MED ORDER — CALAMINE EX LOTN
TOPICAL_LOTION | CUTANEOUS | Status: DC | PRN
  Administered 2015-04-14: 11:00:00 via TOPICAL
  Filled 2015-04-14: qty 118

## 2015-04-14 NOTE — Progress Notes (Signed)
Subjective:    Reports of desaturation with exertion but sustained above 95% with oxygen supplementation Given iv iron and PRBC during HD yesterday.   Objective:  Vital signs in last 24 hours:  Temp:  [98 F (36.7 C)-99.6 F (37.6 C)] 98.9 F (37.2 C) (10/19 0915) Pulse Rate:  [87-95] 90 (10/19 0915) Resp:  [14-20] 18 (10/19 0915) BP: (112-145)/(65-94) 144/83 mmHg (10/19 0915) SpO2:  [90 %-98 %] 98 % (10/19 0915) FiO2 (%):  [94 %-96 %] 94 % (10/18 1750) Weight:  [98.884 kg (218 lb)] 98.884 kg (218 lb) (10/18 1408)  Weight change: -0.085 kg (-3 oz) Filed Weights   04/13/15 0700 04/13/15 0945 04/13/15 1408  Weight: 93.985 kg (207 lb 3.2 oz) 93.9 kg (207 lb 0.2 oz) 98.884 kg (218 lb)    Intake/Output: I/O last 3 completed shifts: In: 1946 [P.O.:1120; I.V.:176; Blood:650] Out: 1225 [Urine:1225]   Intake/Output this shift:     Physical Exam: General: No acute distress  Head: Normocephalic, atraumatic. Moist oral mucosal membranes  Eyes: Anicteric, pallor noted  Neck: Supple, trachea midline  Lungs:  Clear to auscultation normal effort, O2 Webb supplementation  Heart: Regular rate and rhythm no rubs  Abdomen:  Soft, mild left sided tenderness, no rebound, BS present  Extremities: no peripheral edema.  Neurologic: Nonfocal, moving all four extremities  Skin: No lesions, Some icteris notes  Access: Right femoral temp HD catheter.    Basic Metabolic Panel:  Recent Labs Lab 04/10/15 0448 04/11/15 0430 04/12/15 0418 04/13/15 0501 04/14/15 0412  NA 139 142 137  138 134* 138  K 3.9 4.2 3.4*  3.5 3.6 3.5  CL 100* 103 99*  100* 97* 100*  CO2 31 30 31   32 28 31  GLUCOSE 112* 122* 125*  129* 114* 99  BUN 27* 54* 39*  40* 60* 43*  CREATININE 2.89* 4.68* 3.67*  3.66* 5.18* 4.04*  CALCIUM 7.7* 7.5* 7.2*  7.4* 7.2* 7.4*  MG 1.8 1.9 2.0 2.0 2.0  PHOS 2.3* 4.8* 3.4 4.1 3.1    Liver Function Tests:  Recent Labs Lab 04/10/15 0448 04/11/15 0430 04/12/15 0418  04/13/15 0501 04/14/15 0412  AST  --   --  32  --   --   ALT  --   --  119*  --   --   ALKPHOS  --   --  195*  --   --   BILITOT  --   --  4.1*  --   --   PROT  --   --  5.4*  --   --   ALBUMIN 2.6* 2.6* 2.5*  2.5* 2.4* 2.5*   No results for input(s): LIPASE, AMYLASE in the last 168 hours. No results for input(s): AMMONIA in the last 168 hours.  CBC:  Recent Labs Lab 04/09/15 0431 04/09/15 1607 04/10/15 0448  04/11/15 0430  04/12/15 0418 04/12/15 1752 04/12/15 2125 04/13/15 0501 04/13/15 1505  WBC 17.4* 12.5* 18.1*  --  17.3*  --  16.7*  --   --   --   --   HGB 7.5* 6.7* 9.1*  < > 6.8*  < > 7.2* 7.0* 6.7* 6.6* 9.3*  HCT 22.7* 20.6* 26.6*  < > 20.1*  < > 21.4* 21.0* 20.5* 20.1* 27.3*  MCV 86.2 85.6 86.2  --  85.9  --  86.0  --   --   --   --   PLT 57* 89* 92*  --  158  --  158  --   --   --   --   < > =  values in this interval not displayed.  Cardiac Enzymes: No results for input(s): CKTOTAL, CKMB, CKMBINDEX, TROPONINI in the last 168 hours.  BNP: Invalid input(s): POCBNP  CBG:  Recent Labs Lab 04/08/15 2333 04/09/15 0411 04/09/15 0745 04/09/15 1152 04/09/15 1617  GLUCAP 103* 110* 110* 164* 113*    Microbiology: Results for orders placed or performed during the hospital encounter of 04/06/15  MRSA PCR Screening     Status: None   Collection Time: 04/07/15  9:49 AM  Result Value Ref Range Status   MRSA by PCR NEGATIVE NEGATIVE Final    Comment:        The GeneXpert MRSA Assay (FDA approved for NASAL specimens only), is one component of a comprehensive MRSA colonization surveillance program. It is not intended to diagnose MRSA infection nor to guide or monitor treatment for MRSA infections.     Coagulation Studies: No results for input(s): LABPROT, INR in the last 72 hours.  Urinalysis: No results for input(s): COLORURINE, LABSPEC, PHURINE, GLUCOSEU, HGBUR, BILIRUBINUR, KETONESUR, PROTEINUR, UROBILINOGEN, NITRITE, LEUKOCYTESUR in the last 72  hours.  Invalid input(s): APPERANCEUR    Imaging: Dg Chest 2 View  04/14/2015  CLINICAL DATA:  Renal biopsy 1 week ago with bleeding subsequently, followup pleural effusion EXAM: CHEST  2 VIEW COMPARISON:  04/12/2015 FINDINGS: Right lung remains clear. Stable cardiac enlargement. Fairly extensive hazy density over the mid to lower left lung zone unchanged. Lateral view indicates that this is related to a large left pleural effusion, with no interval change. IMPRESSION: Large left pleural effusion unchanged Electronically Signed   By: Skipper Cliche M.D.   On: 04/14/2015 10:06   Dg Chest 2 View  04/12/2015  CLINICAL DATA:  Followup hypoxia. EXAM: CHEST  2 VIEW COMPARISON:  None FINDINGS: Normal heart size. There is a large left pleural effusion. This is similar to 04/10/2015. No right pleural fluid noted. The right lung is clear. IMPRESSION: 1. Large left pleural effusion. Electronically Signed   By: Kerby Moors M.D.   On: 04/12/2015 16:07     Medications:     . sodium chloride   Intravenous Once  . antiseptic oral rinse  7 mL Mouth Rinse BID  . HYDROmorphone   Intravenous 6 times per day  . Influenza vac split quadrivalent PF  0.5 mL Intramuscular Tomorrow-1000  . pantoprazole  40 mg Oral BID AC  . rosuvastatin  10 mg Oral q1800   sodium chloride, sodium chloride, acetaminophen, calamine, diphenhydrAMINE **OR** diphenhydrAMINE, HYDROmorphone (DILAUDID) injection, naloxone **AND** sodium chloride, ondansetron (ZOFRAN) IV, ondansetron (ZOFRAN) IV, oxyCODONE-acetaminophen  Assessment/ Plan:  49 y.o. male with past medical history of hypertension, hyperlipidemia, anemia chronic kidney disease, peptic ulcer disease, CKD stage IV, anemia of CKD proteinuria, hyperkalemia s/p renal bioy 04/06/15 with subsequent left perinephric hematoma.  1.  Acute renal failure/CKD stage IV/Proteinuria:  Pt s/p left renal biopsy. Perinephric hematoma developed which was initially subcapsular, subsequently  extracapsular bleeding noted.  Possible AA amyloid noted on prelim renal biopsy, but official final report still pending. -Suspect pt has reached ESRD even prior to embolization.  - monitor UOP, await final Kidney biopsy results - Hematology consult for amyloid - case discussed with patient's wife and mother - d/c foley cathter  2.  Left perinephric hematoma:  Noted post biopsy, was initially subcapsular, now extracapsular.   -s/p embolization of left kidney (done on Sunday)  3.  Post hemorrhagic anemia:  hgb trending down slowly Iron deficiency- iv iron with HD - 2 units blood  transfusion with HD   4.  Hyperkalemia:  K currently 3.5 and acceptable.  - encourage regular diet  5. Large left pleural effusion] - may need a tap if continues to be symptomatic    LOS: 7 Linsi Humann 10/19/201610:58 AM

## 2015-04-14 NOTE — Care Management (Addendum)
Dicussed case with Iran Sizer at [facility].  Stated no H and P in chart will contact attending.  Paln is for [employer], TTS 11PM. Spoke with Dr Candiss Norse regarding H and P . Use Consult Note.

## 2015-04-14 NOTE — Progress Notes (Signed)
 Vein and Vascular Surgery  Daily Progress Note   Subjective  -   Patient has done well on dialysis however his kidney function does not appear to be recovering. His flank pain has largely resolved  Objective Filed Vitals:   04/14/15 1122 04/14/15 1200 04/14/15 1205 04/14/15 1600  BP:   142/96   Pulse:   81   Temp:   99.2 F (37.3 C)   TempSrc:   Oral   Resp: _0 Height:      Weight:      SpO2: 90% 100% 100% 97%    Intake/Output Summary (Last 24 hours) at 04/14/15 1755 Last data filed at 04/14/15 1654  Gross per 24 hour  Intake    427 ml  Output    575 ml  Net   -148 ml    PULM  Normal effort , no use of accessory muscles CV  No JVD, RRR Abd      No distended, nontender VASC  right groin clean dry and intact with temporal catheter no drainage no erythema  Laboratory CBC    Component Value Date/Time   WBC 16.7* 04/12/2015 0418   WBC 8.1 04/06/2014 0754   HGB 9.3* 04/13/2015 1505   HGB 9.9* 04/06/2014 0754   HCT 27.3* 04/13/2015 1505   HCT 30.7* 04/06/2014 0754   PLT 158 04/12/2015 0418   PLT 273 04/06/2014 0754    BMET    Component Value Date/Time   NA 138 04/14/2015 0412   NA 141 04/06/2014 0754   K 3.5 04/14/2015 0412   K 5.2* 04/06/2014 0754   CL 100* 04/14/2015 0412   CL 111* 04/06/2014 0754   CO2 31 04/14/2015 0412   CO2 24 04/06/2014 0754   GLUCOSE 99 04/14/2015 0412   GLUCOSE 104* 04/06/2014 0754   BUN 43* 04/14/2015 0412   BUN 39* 04/06/2014 0754   CREATININE 4.04* 04/14/2015 0412   CREATININE 2.76* 04/06/2014 0754   CALCIUM 7.4* 04/14/2015 0412   CALCIUM 8.4* 04/06/2014 0754   GFRNONAA 16* 04/14/2015 0412   GFRNONAA 26* 04/06/2014 0754   GFRAA 18* 04/14/2015 0412   GFRAA 32* 04/06/2014 0754    Assessment/Planning:   Patient is status post embolization of the left kidney which has resolved the bleeding issues and he is doing much better. His renal function does not appear to be improving as is expected and therefore  plans will be made for placement of a tunneled catheter. The risks and benefits of been reviewed with the patient all questions of been answered and he agrees to proceed. He is scheduled for thoracentesis and possible bone marrow biopsy tomorrow therefore I will plan for catheter placement on Friday    Katha Cabal  04/14/2015, 5:55 PM

## 2015-04-14 NOTE — Progress Notes (Signed)
[place] [facility] [place] NAME: Joseph [name]    MR#:  [id number]  DATE OF BIRTH:  May 08, 1966  SUBJECTIVE:  CHIEF COMPLAINT:  No chief complaint on file. pain under control with PCA drip, s/p embolization, Hb stable today, . No cough or sputum. More hypoxic- requiring 5 ltr oxygen now.   REVIEW OF SYSTEMS:  CONSTITUTIONAL: No fever, generalized weakness.  EYES: No blurred or double vision.  EARS, NOSE, AND THROAT: No tinnitus or ear pain.  RESPIRATORY: No cough,positive for shortness of breath, no wheezing or hemoptysis.  CARDIOVASCULAR: No chest pain, orthopnea, edema.  GASTROINTESTINAL: has nausea, no vomiting, diarrhea, but has abdominal pain.  GENITOURINARY: No dysuria, hematuria.  ENDOCRINE: No polyuria, nocturia,  HEMATOLOGY: No anemia, easy bruising or bleeding SKIN: No rash or lesion. MUSCULOSKELETAL: Worsening back pain.   NEUROLOGIC: No tingling, numbness, weakness.  PSYCHIATRY: No anxiety or depression.   DRUG ALLERGIES:   Allergies  Allergen Reactions  . Ciprofloxacin Hcl   . Doxycycline Hyclate     VITALS:  Blood pressure 138/83, pulse 90, temperature 98.6 F (37 C), temperature source Oral, resp. rate 17, height $RemoveBe'5\' 11"'nVClKmvIr$  (1.803 m), weight 98.884 kg (218 lb), SpO2 99 %.  PHYSICAL EXAMINATION:  GENERAL:  49 y.o.-year-old patient lying in the bed with no acute distress.  EYES: Pupils equal, round, reactive to light and accommodation. No scleral icterus. Extraocular muscles intact.  HEENT: Head atraumatic, normocephalic. Oropharynx and nasopharynx clear. Moist oral mucosa. NECK:  Supple, no jugular venous distention. No thyroid enlargement, no tenderness.  LUNGS: decreased breath sound on left side, no wheezing, rales,rhonchi or crepitation. No use of accessory muscles of respiration.  CARDIOVASCULAR: S1, S2 normal. No murmurs, rubs, or gallops.  ABDOMEN: Soft, nontender, nondistended. Bowel sounds present. No  organomegaly or mass.  EXTREMITIES: No pedal edema, cyanosis, or clubbing.  NEUROLOGIC: Cranial nerves II through XII are intact. Muscle strength 4/5 in all extremities. Sensation intact. Gait not checked.  PSYCHIATRIC: The patient is alert and oriented x 3.  SKIN: No obvious rash, lesion, or ulcer.    LABORATORY PANEL:   CBC  Recent Labs Lab 04/12/15 0418  04/13/15 1505  WBC 16.7*  --   --   HGB 7.2*  < > 9.3*  HCT 21.4*  < > 27.3*  PLT 158  --   --   < > = values in this interval not displayed. ------------------------------------------------------------------------------------------------------------------  Chemistries   Recent Labs Lab 04/12/15 0418  04/14/15 0412  NA 137  138  < > 138  K 3.4*  3.5  < > 3.5  CL 99*  100*  < > 100*  CO2 31  32  < > 31  GLUCOSE 125*  129*  < > 99  BUN 39*  40*  < > 43*  CREATININE 3.67*  3.66*  < > 4.04*  CALCIUM 7.2*  7.4*  < > 7.4*  MG 2.0  < > 2.0  AST 32  --   --   ALT 119*  --   --   ALKPHOS 195*  --   --   BILITOT 4.1*  --   --   < > = values in this interval not displayed. ------------------------------------------------------------------------------------------------------------------  Cardiac Enzymes No results for input(s): TROPONINI in the last 168 hours. ------------------------------------------------------------------------------------------------------------------  RADIOLOGY:  Dg Chest 2 View  04/14/2015  CLINICAL DATA:  Renal biopsy 1 week ago with bleeding subsequently, followup pleural effusion EXAM: CHEST  2 VIEW  COMPARISON:  04/12/2015 FINDINGS: Right lung remains clear. Stable cardiac enlargement. Fairly extensive hazy density over the mid to lower left lung zone unchanged. Lateral view indicates that this is related to a large left pleural effusion, with no interval change. IMPRESSION: Large left pleural effusion unchanged Electronically Signed   By: Skipper Cliche M.D.   On: 04/14/2015 10:06      ASSESSMENT AND PLAN:   * Acute renal failure/CKD stage IV progressed to ESRD. Due to Amyloid- as per primary biopsy report. He got CRRT for 3 days and need HD. Per Dr. Holley Raring ,  on HD for now. Continue to monitor electrolytes and UOP closely.Possible AA amyloid noted on prelim renal biopsy per Dr. Candiss Norse.  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    Bone marrow biopsy planned .  *  left scapsular hematoma after renal biopsy.  Perinephric hematoma developed which was initially subcapsular, later with extracapsular bleeding. Per CT abd worsening of hematoma, along with possible adrenal gland hemorrhage.   embolization of left kidney by vascular on 04/11/15.  * Hyperkalemia. Improved.  *  Hpotension: improved.  *Acute blood loss anemia due to 1. Hb increased from 6.6 to 7.5 after 2 unit PRBC transfusion.    Hb dropped again- 2 more units PRBC transfusion on 04/13/15.   Follow closely.  * Hypomagnesemia. Mag supplement. Improved.  * Leukocytosis. Possible due to hematoma. F/u CBC.  * Thrombocytopenia. Improved after given platelet transfusion and vitamin K. He got 20 mg of DDAVP IV twice per Dr. Isidore Moos. follow-up CBC.  * Hypoxia and tachycardia, due to symptomatic anemia/ESRD. On O2 [place] 5 L.   Some atelactesis on X ray and CT of chest- encouraged incentive spirometry.  Xray reported large pleural effusion, Spoke to Pulm and Nephro, trial of lasix for now, may need Thorocentesis as still same on follow up xray.  Pleural tap tomorrow am.  I discussed with Dr. Candiss Norse and pt's mother present in room. Pt and his mother had some questions  About amyloid diagnosis and need for BM biopsy.   They want to discuss more with Dr. Grayland Ormond- I informed him.  All the records are reviewed and case discussed with Care Management/Social Workerr. Management plans discussed with the patient,  and mother, and they are in agreement.  CODE  STATUS: Full code.  TOTAL TIME TAKING CARE OF THIS PATIENT: 40 minutes.   POSSIBLE D/C IN >3 DAYS, DEPENDING ON CLINICAL CONDITION.   Vaughan Basta M.D on 04/14/2015 at 9:35 PM  Between 7am to 6pm - Pager - [phone]  After 6pm go to [email or web address] - password [email or web address] [facility]  Office  [phone]  CC: Primary care physician; Pcp Not In System

## 2015-04-14 NOTE — Care Management Note (Signed)
I have spoke with Dr. Candiss Norse this morning about patients new ESRD diagnosis.  He has asked that I proceed with placement to [place].  This will allow for patient to remain under the care of his current nephrologist that has been following him prior to admission. I am gathering his medical records this morning and will update if there is any missing required records.  [facility] Liaison   [phone]

## 2015-04-15 ENCOUNTER — Inpatient Hospital Stay: Payer: BLUE CROSS/BLUE SHIELD

## 2015-04-15 DIAGNOSIS — J942 Hemothorax: Secondary | ICD-10-CM

## 2015-04-15 DIAGNOSIS — J90 Pleural effusion, not elsewhere classified: Secondary | ICD-10-CM

## 2015-04-15 LAB — CBC
HCT: 27.8 % — ABNORMAL LOW (ref 40.0–52.0)
HCT: 29.1 % — ABNORMAL LOW (ref 40.0–52.0)
HEMOGLOBIN: 9.9 g/dL — AB (ref 13.0–18.0)
Hemoglobin: 9.2 g/dL — ABNORMAL LOW (ref 13.0–18.0)
MCH: 28.9 pg (ref 26.0–34.0)
MCH: 29.5 pg (ref 26.0–34.0)
MCHC: 33.2 g/dL (ref 32.0–36.0)
MCHC: 33.9 g/dL (ref 32.0–36.0)
MCV: 87.1 fL (ref 80.0–100.0)
MCV: 87 fL (ref 80.0–100.0)
PLATELETS: 288 10*3/uL (ref 150–440)
PLATELETS: 334 10*3/uL (ref 150–440)
RBC: 3.2 MIL/uL — ABNORMAL LOW (ref 4.40–5.90)
RBC: 3.35 MIL/uL — ABNORMAL LOW (ref 4.40–5.90)
RDW: 14.8 % — AB (ref 11.5–14.5)
RDW: 14.6 % — ABNORMAL HIGH (ref 11.5–14.5)
WBC: 15.1 10*3/uL — AB (ref 3.8–10.6)
WBC: 15.3 10*3/uL — ABNORMAL HIGH (ref 3.8–10.6)

## 2015-04-15 LAB — BASIC METABOLIC PANEL
Anion gap: 8 (ref 5–15)
BUN: 53 mg/dL — AB (ref 6–20)
CALCIUM: 7.5 mg/dL — AB (ref 8.9–10.3)
CO2: 32 mmol/L (ref 22–32)
Chloride: 98 mmol/L — ABNORMAL LOW (ref 101–111)
Creatinine, Ser: 4.82 mg/dL — ABNORMAL HIGH (ref 0.61–1.24)
GFR calc Af Amer: 15 mL/min — ABNORMAL LOW (ref >60)
GFR, EST NON AFRICAN AMERICAN: 13 mL/min — AB (ref >60)
GLUCOSE: 100 mg/dL — AB (ref 65–99)
Potassium: 3.4 mmol/L — ABNORMAL LOW (ref 3.5–5.1)
SODIUM: 138 mmol/L (ref 135–145)

## 2015-04-15 LAB — BODY FLUID CELL COUNT WITH DIFFERENTIAL
Eos, Fluid: 0 %
LYMPHS FL: 13 %
MONOCYTE-MACROPHAGE-SEROUS FLUID: 20 %
NEUTROPHIL FLUID: 67 %
OTHER CELLS FL: 0 %
Total Nucleated Cell Count, Fluid: 2874 cu mm

## 2015-04-15 LAB — TYPE AND SCREEN
ABO/RH(D): O POS
ANTIBODY SCREEN: NEGATIVE
UNIT DIVISION: 0
UNIT DIVISION: 0
Unit division: 0

## 2015-04-15 LAB — PROTEIN, BODY FLUID

## 2015-04-15 LAB — IGG, IGA, IGM
IGM, SERUM: 133 mg/dL (ref 20–172)
IgA: 150 mg/dL (ref 90–386)
IgG (Immunoglobin G), Serum: 783 mg/dL (ref 700–1600)

## 2015-04-15 LAB — PROTIME-INR
INR: 1.07
Prothrombin Time: 14.1 seconds (ref 11.4–15.0)

## 2015-04-15 LAB — GLUCOSE, SEROUS FLUID: GLUCOSE FL: 99 mg/dL

## 2015-04-15 LAB — RENAL FUNCTION PANEL
ALBUMIN: 2.5 g/dL — AB (ref 3.5–5.0)
ANION GAP: 6 (ref 5–15)
BUN: 54 mg/dL — ABNORMAL HIGH (ref 6–20)
CALCIUM: 7.5 mg/dL — AB (ref 8.9–10.3)
CO2: 32 mmol/L (ref 22–32)
CREATININE: 4.87 mg/dL — AB (ref 0.61–1.24)
Chloride: 100 mmol/L — ABNORMAL LOW (ref 101–111)
GFR calc non Af Amer: 13 mL/min — ABNORMAL LOW (ref >60)
GFR, EST AFRICAN AMERICAN: 15 mL/min — AB (ref >60)
GLUCOSE: 99 mg/dL (ref 65–99)
PHOSPHORUS: 3.3 mg/dL (ref 2.5–4.6)
Potassium: 3.4 mmol/L — ABNORMAL LOW (ref 3.5–5.1)
SODIUM: 138 mmol/L (ref 135–145)

## 2015-04-15 LAB — LACTATE DEHYDROGENASE, PLEURAL OR PERITONEAL FLUID: LD FL: 578 U/L — AB (ref 3–23)

## 2015-04-15 LAB — TROPONIN I: TROPONIN I: 0.08 ng/mL — AB (ref <0.031)

## 2015-04-15 LAB — MAGNESIUM: Magnesium: 2 mg/dL (ref 1.7–2.4)

## 2015-04-15 MED ORDER — CEFAZOLIN SODIUM 1-5 GM-% IV SOLN
1.0000 g | INTRAVENOUS | Status: AC
Start: 2015-04-16 — End: 2015-04-15
  Administered 2015-04-15: 1 g via INTRAVENOUS
  Filled 2015-04-15: qty 50

## 2015-04-15 MED ORDER — HYDROMORPHONE HCL 1 MG/ML IJ SOLN
2.0000 mg | INTRAMUSCULAR | Status: DC | PRN
  Administered 2015-04-15 – 2015-04-16 (×3): 2 mg via INTRAVENOUS
  Filled 2015-04-15 (×3): qty 2

## 2015-04-15 MED ORDER — HYDROMORPHONE HCL 1 MG/ML IJ SOLN
1.0000 mg | INTRAMUSCULAR | Status: AC
  Administered 2015-04-15: 1 mg via INTRAVENOUS

## 2015-04-15 NOTE — Progress Notes (Signed)
Dr. Anselm Jungling was notified of a positive troponin 0.08. No new order at this time.   Joseph [name]

## 2015-04-15 NOTE — Progress Notes (Signed)
Subjective:    Patient underwent thoracentesis today. He has acute pain postprocedure. Bloody fluid was noted. Family at bedside  Objective:  Vital signs in last 24 hours:  Temp:  [98.2 F (36.8 C)-98.6 F (37 C)] 98.3 F (36.8 C) (10/20 1035) Pulse Rate:  [77-97] 89 (10/20 1035) Resp:  [17-26] 26 (10/20 1002) BP: (138-166)/(83-103) 149/85 mmHg (10/20 1035) SpO2:  [97 %-100 %] 97 % (10/20 1035) Weight:  [97.115 kg (214 lb 1.6 oz)] 97.115 kg (214 lb 1.6 oz) (10/20 0500)  Weight change: 3.215 kg (7 lb 1.4 oz) Filed Weights   04/13/15 1408 04/14/15 2100 04/15/15 0500  Weight: 98.884 kg (218 lb) 97.115 kg (214 lb 1.6 oz) 97.115 kg (214 lb 1.6 oz)    Intake/Output: I/O last 3 completed shifts: In: 15 [P.O.:360; I.V.:67] Out: 1050 [Urine:1050]   Intake/Output this shift:     Physical Exam: General: No acute distress  Head: Normocephalic, atraumatic. Moist oral mucosal membranes  Eyes: Anicteric, pallor noted  Neck: Supple, trachea midline  Lungs:  Clear to auscultation normal effort, O2 [place] supplementation  Heart: Regular rate and rhythm no rubs  Abdomen:  Soft, No rebound, BS present  Extremities: no peripheral edema.  Neurologic: Nonfocal, moving all four extremities  Skin:  reddish blanching rash over the back, Some icterus notes  Access: Right femoral temp HD catheter.    Basic Metabolic Panel:  Recent Labs Lab 04/11/15 0430 04/12/15 0418 04/13/15 0501 04/14/15 0412 04/15/15 0440  NA 142 137  138 134* 138 138  138  K 4.2 3.4*  3.5 3.6 3.5 3.4*  3.4*  CL 103 99*  100* 97* 100* 100*  98*  CO2 30 31  32 28 31 32  32  GLUCOSE 122* 125*  129* 114* 99 99  100*  BUN 54* 39*  40* 60* 43* 54*  53*  CREATININE 4.68* 3.67*  3.66* 5.18* 4.04* 4.87*  4.82*  CALCIUM 7.5* 7.2*  7.4* 7.2* 7.4* 7.5*  7.5*  MG 1.9 2.0 2.0 2.0 2.0  PHOS 4.8* 3.4 4.1 3.1 3.3    Liver Function Tests:  Recent Labs Lab 04/11/15 0430 04/12/15 0418 04/13/15 0501  04/14/15 0412 04/15/15 0440  AST  --  32  --   --   --   ALT  --  119*  --   --   --   ALKPHOS  --  195*  --   --   --   BILITOT  --  4.1*  --   --   --   PROT  --  5.4*  --   --   --   ALBUMIN 2.6* 2.5*  2.5* 2.4* 2.5* 2.5*   No results for input(s): LIPASE, AMYLASE in the last 168 hours. No results for input(s): AMMONIA in the last 168 hours.  CBC:  Recent Labs Lab 04/09/15 1607 04/10/15 0448  04/11/15 0430  04/12/15 0418 04/12/15 1752 04/12/15 2125 04/13/15 0501 04/13/15 1505 04/15/15 0440  WBC 12.5* 18.1*  --  17.3*  --  16.7*  --   --   --   --  15.1*  HGB 6.7* 9.1*  < > 6.8*  < > 7.2* 7.0* 6.7* 6.6* 9.3* 9.2*  HCT 20.6* 26.6*  < > 20.1*  < > 21.4* 21.0* 20.5* 20.1* 27.3* 27.8*  MCV 85.6 86.2  --  85.9  --  86.0  --   --   --   --  87.1  PLT 89* 92*  --  158  --  158  --   --   --   --  288  < > = values in this interval not displayed.  Cardiac Enzymes: No results for input(s): CKTOTAL, CKMB, CKMBINDEX, TROPONINI in the last 168 hours.  BNP: Invalid input(s): POCBNP  CBG:  Recent Labs Lab 04/08/15 2333 04/09/15 0411 04/09/15 0745 04/09/15 1152 04/09/15 1617  GLUCAP 103* 110* 110* 164* 113*    Microbiology: Results for orders placed or performed during the hospital encounter of 04/06/15  MRSA PCR Screening     Status: None   Collection Time: 04/07/15  9:49 AM  Result Value Ref Range Status   MRSA by PCR NEGATIVE NEGATIVE Final    Comment:        The GeneXpert MRSA Assay (FDA approved for NASAL specimens only), is one component of a comprehensive MRSA colonization surveillance program. It is not intended to diagnose MRSA infection nor to guide or monitor treatment for MRSA infections.     Coagulation Studies:  Recent Labs  04/15/15 1146  LABPROT 14.1  INR 1.07    Urinalysis: No results for input(s): COLORURINE, LABSPEC, PHURINE, GLUCOSEU, HGBUR, BILIRUBINUR, KETONESUR, PROTEINUR, UROBILINOGEN, NITRITE, LEUKOCYTESUR in the last 72  hours.  Invalid input(s): APPERANCEUR    Imaging: Dg Chest 2 View  04/14/2015  CLINICAL DATA:  Renal biopsy 1 week ago with bleeding subsequently, followup pleural effusion EXAM: CHEST  2 VIEW COMPARISON:  04/12/2015 FINDINGS: Right lung remains clear. Stable cardiac enlargement. Fairly extensive hazy density over the mid to lower left lung zone unchanged. Lateral view indicates that this is related to a large left pleural effusion, with no interval change. IMPRESSION: Large left pleural effusion unchanged Electronically Signed   By: Skipper Cliche M.D.   On: 04/14/2015 10:06     Medications:     . antiseptic oral rinse  7 mL Mouth Rinse BID  . Influenza vac split quadrivalent PF  0.5 mL Intramuscular Tomorrow-1000  . pantoprazole  40 mg Oral BID AC  . rosuvastatin  10 mg Oral q1800   sodium chloride, sodium chloride, acetaminophen, calamine, HYDROmorphone (DILAUDID) injection, ondansetron (ZOFRAN) IV, oxyCODONE-acetaminophen  Assessment/ Plan:  49 y.o. male with past medical history of hypertension, hyperlipidemia, anemia chronic kidney disease, peptic ulcer disease, CKD stage IV, anemia of CKD proteinuria, hyperkalemia s/p renal bioy 04/06/15 with subsequent left perinephric hematoma.  1.  Acute renal failure/CKD stage IV/Proteinuria:  Pt s/p left renal biopsy. Perinephric hematoma developed which was initially subcapsular, subsequently extracapsular bleeding noted.  Possible AA amyloid noted on prelim renal biopsy, but official final report still pending. - Suspect pt has reached ESRD even prior to embolization.  - monitor UOP, await final Kidney biopsy results - Hematology consult for amyloid - case discussed with patient's wife and mother - Patient was to defer any further procedures. However, we will continue to keep him on schedule for a PermCath tomorrow morning in case he is doing better and changes his mind. - Possibly next dialysis tomorrow  2.  Left perinephric hematoma:   Noted post biopsy, was initially subcapsular, now extracapsular.   -s/p embolization of left kidney (done on Sunday)  3.  Post hemorrhagic anemia:  hgb trending down slowly Iron deficiency- iv iron with HD - 2 units blood transfusion with HD   4.  Hyperkalemia:  K currently 3.5 and acceptable.  - encourage regular diet  5. Large left pleural effusion - s/p pleural tap  - ? Reactive from intra-abdominal bleed  LOS: 8 Joseph [name] 10/20/201612:50 PM

## 2015-04-15 NOTE — Care Management (Signed)
Spoke with patient spouse Claudine about discharge plan. Patient is from home and independent prior to hospitalization.  Here for complication from renal biopsy. Will need Hemodialysis.  Permacath placement scheduled for tomorrow. [place] Sizer from patient pathways folllowing. More to follow.

## 2015-04-15 NOTE — Progress Notes (Signed)
[place] at [place] NAME: Joseph [name]    MR#:  [id number]  DATE OF BIRTH:  October 13, 1965  SUBJECTIVE:  CHIEF COMPLAINT:  No chief complaint on file. pain was under control with PCA drip- so started on percocet and drip d/ced yesterday, s/p embolization, Hb stable today, s/p pleural tap today- reported 1 ltr bloody fluid, sent for lab report, Post procedure Xray does nto show Pneumothorax. Pt has c/o severe pain in chest on left side during injecting anesthesia for procedure and after the procedure. Family in room during my encounter- and Dr. Candiss Norse also in room. Any movements , even deep breath giving him extreme pain.   REVIEW OF SYSTEMS:  CONSTITUTIONAL: No fever, generalized weakness.  EYES: No blurred or double vision.  EARS, NOSE, AND THROAT: No tinnitus or ear pain.  RESPIRATORY: No cough,positive for shortness of breath, no wheezing or hemoptysis.  CARDIOVASCULAR: have severe chest pain- after procedure , no orthopnea, edema.  GASTROINTESTINAL: has nausea, no vomiting, diarrhea, but has abdominal pain.  GENITOURINARY: No dysuria, hematuria.  ENDOCRINE: No polyuria, nocturia,  HEMATOLOGY: No anemia, easy bruising or bleeding SKIN: No rash or lesion. MUSCULOSKELETAL: Worsening back pain.   NEUROLOGIC: No tingling, numbness, weakness.  PSYCHIATRY: No anxiety or depression.   DRUG ALLERGIES:   Allergies  Allergen Reactions  . Ciprofloxacin Hcl   . Doxycycline Hyclate     VITALS:  Blood pressure 149/85, pulse 89, temperature 98.3 F (36.8 C), temperature source Oral, resp. rate 26, height 5' 11" (1.803 m), weight 97.115 kg (214 lb 1.6 oz), SpO2 97 %.  PHYSICAL EXAMINATION:  GENERAL:  49 y.o.-year-old patient lying in the bed with acute distress.  EYES: Pupils equal, round, reactive to light and accommodation. No scleral icterus. Extraocular muscles intact.  HEENT: Head atraumatic, normocephalic. Oropharynx and nasopharynx  clear. Moist oral mucosa. NECK:  Supple, no jugular venous distention. No thyroid enlargement, no tenderness.  LUNGS: decreased breath sound on left side, no wheezing, rales,rhonchi or crepitation. No use of accessory muscles of respiration. Having shallow breaths to prevent pain, no bleeding noted from dressing site- post procedure. I tried to sit him up to examine his back at the site- which caused him extreme pain. CARDIOVASCULAR: S1, S2 normal.tachycardia with pain, No murmurs, rubs, or gallops.  ABDOMEN: Soft, nontender, nondistended. Bowel sounds present. No organomegaly or mass.  EXTREMITIES: No pedal edema, cyanosis, or clubbing.  NEUROLOGIC: Cranial nerves II through XII are intact. Muscle strength 4/5 in all extremities. Sensation intact. Gait not checked.  PSYCHIATRIC: The patient is alert and oriented x 3.  SKIN: No obvious rash, lesion, or ulcer.    LABORATORY PANEL:   CBC  Recent Labs Lab 04/15/15 0440  WBC 15.1*  HGB 9.2*  HCT 27.8*  PLT 288   ------------------------------------------------------------------------------------------------------------------  Chemistries   Recent Labs Lab 04/12/15 0418  04/15/15 0440  NA 137  138  < > 138  138  K 3.4*  3.5  < > 3.4*  3.4*  CL 99*  100*  < > 100*  98*  CO2 31  32  < > 32  32  GLUCOSE 125*  129*  < > 99  100*  BUN 39*  40*  < > 54*  53*  CREATININE 3.67*  3.66*  < > 4.87*  4.82*  CALCIUM 7.2*  7.4*  < > 7.5*  7.5*  MG 2.0  < > 2.0  AST 32  --   --  ALT 119*  --   --   ALKPHOS 195*  --   --   BILITOT 4.1*  --   --   < > = values in this interval not displayed. ------------------------------------------------------------------------------------------------------------------  Cardiac Enzymes No results for input(s): TROPONINI in the last 168 hours. ------------------------------------------------------------------------------------------------------------------  RADIOLOGY:  Dg Chest 2  View  04/14/2015  CLINICAL DATA:  Renal biopsy 1 week ago with bleeding subsequently, followup pleural effusion EXAM: CHEST  2 VIEW COMPARISON:  04/12/2015 FINDINGS: Right lung remains clear. Stable cardiac enlargement. Fairly extensive hazy density over the mid to lower left lung zone unchanged. Lateral view indicates that this is related to a large left pleural effusion, with no interval change. IMPRESSION: Large left pleural effusion unchanged Electronically Signed   By: Skipper Cliche M.D.   On: 04/14/2015 10:06     ASSESSMENT AND PLAN:   * Acute renal failure/CKD stage IV progressed to ESRD. Due to Amyloid- as per primary biopsy report. He got CRRT for 3 days and need HD. Per Dr. Holley Raring ,  on HD for now. Continue to monitor electrolytes and UOP closely.Possible AA amyloid noted on prelim renal biopsy per Dr. Candiss Norse.  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    Bone marrow biopsy suggested by Dr. Grayland Ormond for staging , pt does not want it now- want to feel little better first.     Informed hematologist- that pt would like to know more about staging, treatment options and out come of this disease.  *  left scapsular hematoma after renal biopsy.   Perinephric hematoma developed which was initially subcapsular, later with extracapsular bleeding.  Per CT abd worsening of hematoma, along with possible adrenal gland hemorrhage.   embolization of left kidney by vascular on 04/11/15.  * pleural effusion    S/p US guided tap- 04/15/15- caused 1 ltr bloody fluid removal.    Followed by severe pain, vitals stable.    No pneumothorax.    Will do stat CBC and INR.    Check Troponin.    Repeat Xray chest in evening.    Talked to pulmonologist for follow up.    Dilaudid inj for pain as needed for now.  * Hyperkalemia. Improved.  *  Hpotension: improved.  *Acute blood loss anemia due to 1. Hb increased from 6.6 to 7.5 after 2 unit PRBC  transfusion.    Hb dropped again- 2 more units PRBC transfusion on 04/13/15.   Follow closely.  * Hypomagnesemia. Mag supplement. Improved.  * Leukocytosis. Possible due to hematoma. F/u CBC.  * Thrombocytopenia. Improved after given platelet transfusion and vitamin K. He got 20 mg of DDAVP IV twice per Dr. Isidore Moos. follow-up CBC.  * Hypoxia and tachycardia, due to symptomatic anemia/ESRD. On O2 [place] 5 L.   Some atelactesis on X ray and CT of chest- encouraged incentive spirometry.  Xray reported large pleural effusion, s/p pleural tap now on 04/15/15.  I discussed with Dr. Candiss Norse and pt's mother and wife present in room. Condition is critical due to severe chst pain - post procedure, I called and discussed with radilogy technician about the events during procedure, Dr. Grayland Ormond, Dr. Mortimer Fries about the condition.  All the records are reviewed and case discussed with Care Management/Social Workerr. Management plans discussed with the patient,  and mother, and they are in agreement.  CODE STATUS: Full code.  TOTAL Critical care TIME TAKING CARE OF THIS PATIENT: 60 minutes.   POSSIBLE  D/C IN >3 DAYS, DEPENDING ON CLINICAL CONDITION.   Vaughan Basta M.D on 04/15/2015 at 11:45 AM  Between 7am to 6pm - Pager - [phone]  After 6pm go to [email or web address] - password [email or web address] [facility]  Office  [phone]  CC: Primary care physician; Pcp Not In System

## 2015-04-15 NOTE — Progress Notes (Signed)
* [facility]     Assessment and Plan:  Left hemorrhagic pleural effusion. -Suspect that this is secondary to the patient's recent left kidney hematoma. The patient is now status post thoracentesis with 1 L of pleural fluid which was apparently bloody. We will await further testing of the pleural fluid and monitor the patient's status. The patient has no history of trauma to suggest that he might have a primary spontaneous pneumothorax. -Repeat chest x-ray, I splinted. The patient and his family that he may require another thoracentesis if the fluid recurs over the next few days and weeks.  Left pleuritic chest pain. -Likely secondary to above. We will treat symptomatically, the patient was reassured that this will improve over the next 24-48 hours. I will increase the patient's dose of Dilaudid. He has been on Dilaudid since the time of admission and has been on a Dilaudid PCA, I suspect he may have some degree of tachyphylaxis and does require higher doses of opioid medications. I have increased his Dilaudid dose from 1 mg every 2 hours as needed, from 2 mg every 2 hours as needed. -No NSAIDs at this point due to the patient's acute renal failure.   Acute renal failure. -Consistent with the patient's kidney biopsy which showed amyloidosis, nephrology is following.  Left renal hematoma. -Status post left kidney biopsy, the patient developed postoperative hemorrhagic shock requiring multiple blood products and embolization. This now appears to be stable. The patient's hemoglobins have stabilized.   Date: 04/15/2015  MRN# [id number] Joseph [name] 10/18/1965   Joseph [name] is a 49 y.o. old male seen in follow up for chief complaint of  No chief complaint on file.    HPI:   The patient had a thoracentesis this morning with removal of 1 L of bloody fluid. Since that time, he is had severe left-sided chest pain which is made worse by inspiration and movement and  improved by laying still. It is also improved with pain medications, though still not completely under control. He is currently laying in bed, grabbing his left side and is noticeably uncomfortable.    Allergies:  Ciprofloxacin hcl and Doxycycline hyclate  Review of Systems: Gen:  Denies  fever, sweats. HEENT: Denies blurred vision. Cvc:  No dizziness, chest pain or heaviness Resp:   Denies cough or sputum porduction. Gi: Denies swallowing difficulty, stomach pain. constipation, bowel incontinence Gu:  Denies bladder incontinence, burning urine Ext:   No Joint pain, stiffness. Skin: No skin rash, easy bruising. Endoc:  No polyuria, polydipsia. Psych: No depression, insomnia. Other:  All other systems were reviewed and found to be negative other than what is mentioned in the HPI.   Physical Examination:   VS: BP 149/85 mmHg  Pulse 89  Temp(Src) 98.3 F (36.8 C) (Oral)  Resp 26  Ht 5\' 11"  (1.803 m)  Wt 214 lb 1.6 oz (97.115 kg)  BMI 29.87 kg/m2  SpO2 97%  General Appearance: No distress  Neuro:without focal findings,  speech normal,  HEENT: PERRLA, EOM intact. Pulmonary: normal breath sounds, No wheezing.   CardiovascularNormal S1,S2.  No m/r/g.   Abdomen: Benign, Soft, non-tender. Renal:  No costovertebral tenderness  GU:  Not performed at this time. Endoc: No evident thyromegaly, no signs of acromegaly. Skin:   warm, no rash. Extremities: normal, no cyanosis, clubbing.   LABORATORY PANEL:   CBC  Recent Labs Lab 04/15/15 0440  WBC 15.1*  HGB 9.2*  HCT 27.8*  PLT 288   ------------------------------------------------------------------------------------------------------------------  Chemistries   Recent Labs Lab 04/12/15 0418  04/15/15 0440  NA 137  138  < > 138  138  K 3.4*  3.5  < > 3.4*  3.4*  CL 99*  100*  < > 100*  98*  CO2 31  32  < > 32  32  GLUCOSE 125*  129*  < > 99  100*  BUN 39*  40*  < > 54*  53*  CREATININE 3.67*  3.66*  <  > 4.87*  4.82*  CALCIUM 7.2*  7.4*  < > 7.5*  7.5*  MG 2.0  < > 2.0  AST 32  --   --   ALT 119*  --   --   ALKPHOS 195*  --   --   BILITOT 4.1*  --   --   < > = values in this interval not displayed. ------------------------------------------------------------------------------------------------------------------  Cardiac Enzymes No results for input(s): TROPONINI in the last 168 hours. ------------------------------------------------------------  RADIOLOGY:   No results found for this or any previous visit. Results for orders placed during the hospital encounter of 04/06/15  DG Chest 2 View   Narrative CLINICAL DATA:  Renal biopsy 1 week ago with bleeding subsequently, followup pleural effusion  EXAM: CHEST  2 VIEW  COMPARISON:  04/12/2015  FINDINGS: Right lung remains clear. Stable cardiac enlargement. Fairly extensive hazy density over the mid to lower left lung zone unchanged. Lateral view indicates that this is related to a large left pleural effusion, with no interval change.  IMPRESSION: Large left pleural effusion unchanged   Electronically Signed   By: Skipper Cliche M.D.   On: 04/14/2015 10:06    ------------------------------------------------------------------------------------------------------------------  Thank  you for allowing [facility] to assist in the care of your patient. Our recommendations are noted above.  Please contact us if we can be of further service.   Marda Stalker, MD.  [facility] Office Number: [phone]  Patricia Pesa, M.D.  Vilinda Boehringer, M.D.  Merton Border, M.D

## 2015-04-16 ENCOUNTER — Encounter
Admission: AD | Disposition: A | Discharge status: Home / Self Care | Payer: Self-pay | Source: Ambulatory Visit | Attending: Internal Medicine

## 2015-04-16 ENCOUNTER — Ambulatory Visit: Payer: BLUE CROSS/BLUE SHIELD

## 2015-04-16 HISTORY — PX: PERIPHERAL VASCULAR CATHETERIZATION: SHX172C

## 2015-04-16 LAB — PROTEIN ELECTROPHORESIS, SERUM
A/G RATIO SPE: 0.9 (ref 0.7–1.7)
ALBUMIN ELP: 2.4 g/dL — AB (ref 2.9–4.4)
ALPHA-1-GLOBULIN: 0.2 g/dL (ref 0.0–0.4)
Alpha-2-Globulin: 0.7 g/dL (ref 0.4–1.0)
Beta Globulin: 0.8 g/dL (ref 0.7–1.3)
GAMMA GLOBULIN: 0.8 g/dL (ref 0.4–1.8)
Globulin, Total: 2.6 g/dL (ref 2.2–3.9)
TOTAL PROTEIN ELP: 5 g/dL — AB (ref 6.0–8.5)

## 2015-04-16 LAB — RENAL FUNCTION PANEL
ALBUMIN: 2.4 g/dL — AB (ref 3.5–5.0)
Anion gap: 7 (ref 5–15)
BUN: 60 mg/dL — AB (ref 6–20)
CHLORIDE: 100 mmol/L — AB (ref 101–111)
CO2: 32 mmol/L (ref 22–32)
Calcium: 7.5 mg/dL — ABNORMAL LOW (ref 8.9–10.3)
Creatinine, Ser: 5.22 mg/dL — ABNORMAL HIGH (ref 0.61–1.24)
GFR calc Af Amer: 13 mL/min — ABNORMAL LOW (ref >60)
GFR calc non Af Amer: 12 mL/min — ABNORMAL LOW (ref >60)
GLUCOSE: 103 mg/dL — AB (ref 65–99)
POTASSIUM: 3.4 mmol/L — AB (ref 3.5–5.1)
Phosphorus: 4.5 mg/dL (ref 2.5–4.6)
Sodium: 139 mmol/L (ref 135–145)

## 2015-04-16 LAB — BASIC METABOLIC PANEL
Anion gap: 8 (ref 5–15)
BUN: 59 mg/dL — AB (ref 6–20)
CHLORIDE: 100 mmol/L — AB (ref 101–111)
CO2: 31 mmol/L (ref 22–32)
CREATININE: 5.31 mg/dL — AB (ref 0.61–1.24)
Calcium: 7.5 mg/dL — ABNORMAL LOW (ref 8.9–10.3)
GFR calc Af Amer: 13 mL/min — ABNORMAL LOW (ref >60)
GFR calc non Af Amer: 11 mL/min — ABNORMAL LOW (ref >60)
Glucose, Bld: 103 mg/dL — ABNORMAL HIGH (ref 65–99)
POTASSIUM: 3.4 mmol/L — AB (ref 3.5–5.1)
Sodium: 139 mmol/L (ref 135–145)

## 2015-04-16 LAB — CBC
HEMATOCRIT: 29.2 % — AB (ref 40.0–52.0)
HEMOGLOBIN: 9.6 g/dL — AB (ref 13.0–18.0)
MCH: 28.6 pg (ref 26.0–34.0)
MCHC: 32.8 g/dL (ref 32.0–36.0)
MCV: 87.3 fL (ref 80.0–100.0)
Platelets: 370 10*3/uL (ref 150–440)
RBC: 3.34 MIL/uL — AB (ref 4.40–5.90)
RDW: 15 % — ABNORMAL HIGH (ref 11.5–14.5)
WBC: 14.1 10*3/uL — ABNORMAL HIGH (ref 3.8–10.6)

## 2015-04-16 LAB — HEMATOCRIT, BODY FLUID: HEMATOCRIT FL: 14.5 %

## 2015-04-16 LAB — MAGNESIUM: Magnesium: 2 mg/dL (ref 1.7–2.4)

## 2015-04-16 SURGERY — DIALYSIS/PERMA CATHETER INSERTION
Anesthesia: Moderate Sedation

## 2015-04-16 MED ORDER — LIDOCAINE-EPINEPHRINE (PF) 1 %-1:200000 IJ SOLN
INTRAMUSCULAR | Status: AC
  Filled 2015-04-16: qty 30

## 2015-04-16 MED ORDER — SODIUM CHLORIDE 0.9 % IV SOLN
INTRAVENOUS | Status: DC
  Administered 2015-04-16: 1 mL via INTRAVENOUS
  Administered 2015-04-16: 10:00:00 via INTRAVENOUS

## 2015-04-16 MED ORDER — MIDAZOLAM HCL 5 MG/5ML IJ SOLN
INTRAMUSCULAR | Status: AC
  Filled 2015-04-16: qty 5

## 2015-04-16 MED ORDER — HEPARIN (PORCINE) IN NACL 2-0.9 UNIT/ML-% IJ SOLN
INTRAMUSCULAR | Status: AC
  Filled 2015-04-16: qty 500

## 2015-04-16 MED ORDER — FENTANYL CITRATE (PF) 100 MCG/2ML IJ SOLN
INTRAMUSCULAR | Status: DC | PRN
  Administered 2015-04-16: 50 ug via INTRAVENOUS
  Administered 2015-04-16: 100 ug via INTRAVENOUS

## 2015-04-16 MED ORDER — HEPARIN SODIUM (PORCINE) 10000 UNIT/ML IJ SOLN
INTRAMUSCULAR | Status: AC
Start: 2015-04-16 — End: 2015-04-16
  Filled 2015-04-16: qty 1

## 2015-04-16 MED ORDER — SODIUM CHLORIDE 0.9 % IV SOLN: INTRAVENOUS | Status: DC

## 2015-04-16 MED ORDER — FENTANYL CITRATE (PF) 100 MCG/2ML IJ SOLN
INTRAMUSCULAR | Status: AC
  Filled 2015-04-16: qty 2

## 2015-04-16 MED ORDER — LIDOCAINE-EPINEPHRINE (PF) 1 %-1:200000 IJ SOLN
INTRAMUSCULAR | Status: DC | PRN
  Administered 2015-04-16: 10 mL via INTRADERMAL

## 2015-04-16 MED ORDER — MIDAZOLAM HCL 2 MG/2ML IJ SOLN
INTRAMUSCULAR | Status: DC | PRN
  Administered 2015-04-16 (×2): 2 mg via INTRAVENOUS

## 2015-04-16 MED ORDER — CEFAZOLIN SODIUM 1-5 GM-% IV SOLN
INTRAVENOUS | Status: AC
  Filled 2015-04-16: qty 50

## 2015-04-16 MED ORDER — HEPARIN SODIUM (PORCINE) 1000 UNIT/ML IJ SOLN
INTRAMUSCULAR | Status: AC
  Filled 2015-04-16: qty 1

## 2015-04-16 SURGICAL SUPPLY — 9 items
BIOPATCH RED 1 DISK 7.0 (GAUZE/BANDAGES/DRESSINGS) ×2 IMPLANT
BIOPATCH RED 1IN DISK 7.0MM (GAUZE/BANDAGES/DRESSINGS) ×1
CATH PALINDROME RT-P 15FX19CM (CATHETERS) ×3 IMPLANT
DERMABOND ADVANCED (GAUZE/BANDAGES/DRESSINGS) ×2
DERMABOND ADVANCED .7 DNX12 (GAUZE/BANDAGES/DRESSINGS) ×1 IMPLANT
PACK ANGIOGRAPHY (CUSTOM PROCEDURE TRAY) ×3 IMPLANT
SUT MNCRL AB 4-0 PS2 18 (SUTURE) ×3 IMPLANT
SUT SILK 0 FSL (SUTURE) ×3 IMPLANT
TOWEL OR 17X26 4PK STRL BLUE (TOWEL DISPOSABLE) ×3 IMPLANT

## 2015-04-16 NOTE — Progress Notes (Signed)
Pt doing well post permcath placement, vss, family present Dr Delana Meyer in to speak with family and patient with questions answered.

## 2015-04-16 NOTE — Progress Notes (Signed)
Reassessed femoral dressing and was saturated, reinforce more gauze dressings and pressure reapplied for 45 min. Doctor Civil engineer, contracting was notified and instructed to put more pressure for 30 min more minutes. Called CCU for assistance and CCU nurse came and applied PAD. Pt was not in distress and was alert and orientated. Pt will be bedrest tonight, HOB no greater than 30 degrees. Will continue to monitor pt. Night shift nurse is aware of the situation at [place].  Angus Seller

## 2015-04-16 NOTE — Op Note (Signed)
OPERATIVE NOTE   PROCEDURE: 1. Insertion of tunneled dialysis catheter right IJ approach.  PRE-OPERATIVE DIAGNOSIS: End-stage renal disease requiring hemodialysis  POST-OPERATIVE DIAGNOSIS: Same  SURGEON: Jaylon Grode, [name].  ANESTHESIA: Conscious sedation with 1% lidocaine local infiltration  ESTIMATED BLOOD LOSS: Minimal cc  CONTRAST USED:  None  FLUOROSCOPY TIME:    INDICATIONS:   Joseph [name] a 49 y.o. y.o. male who presents with chronic renal insufficiency. Renal biopsy was complicated with hematoma formation. Subsequently he required embolization of the left kidney to stop the bleeding. His renal function has now plateaued at a level which requires hemodialysis and he is therefore undergoing placement of a tunneled catheter. Risks and benefits of been reviewed all questions answered patient agrees to proceed.  DESCRIPTION: After obtaining full informed written consent, the patient was positioned supine. The right neck was prepped and draped in a sterile fashion. Ultrasound was placed in a sterile sleeve. Ultrasound was utilized to identify the right internal jugular vein which is noted to be echolucent and compressible indicating patency. Images recorded for the permanent record. Under real-time visualization a Seldinger needle is inserted into the vein and the guidewires advanced without difficulty. Small counterincision was made at the wire insertion site. Dilators are passed over the wire and the tunneled dialysis catheter is fed into the central venous system without difficulty.  Under fluoroscopy the catheter tip positioned at the atrial caval junction. The catheter is then approximated to the chest wall and an exit site selected. 1% lidocaine is infiltrated in soft tissues at this level small incision is made and the tunneling device is then passed from the exit site to the neck counterincision. Catheter is then connected to the tunneling device and the catheter was pulled  subcutaneously. It is then transected and the hub assembly connected without difficulty. Both lumens aspirate and flush easily. After verification of smooth contour with proper tip position under fluoroscopy the catheter is packed with 5000 units of heparin per lumen.  Catheter secured to the skin of the right neck with 0 silk. A sterile dressing is applied with a Biopatch.  COMPLICATIONS: None  CONDITION: Good  [name], [name] [name] [facility]. Office:  [phone]   04/16/2015,11:22 AM

## 2015-04-16 NOTE — Progress Notes (Signed)
Called by patient RN, Annabella concerning bleeding at right femoral, temporary catheter previously removed by RN, approximately 1515.  Spoke with Dr. Delana Meyer on telephone, advised I would assess bleeding area. Per MD pressure dressing or apply P.[name] To be left on overnight with 40 ml air inflation. Assess for removal in morning. Assessed patient in room. RN had held  Manual pressure x 1 for 30 minutes with rebleeding and again rebled with pressure again held manually by RN for 45 min. Bleeding subsided, with large clot in place. When gauze removed to assess, bleeding again. P.[name] Applied by this RN and Darlyn Chamber, RN with 40 ml air inflation. Instructed Annabella on device and instructions to assess and monitor for bleeding.

## 2015-04-16 NOTE — Progress Notes (Signed)
Subjective:    Pain from thoracentesis is better Underwent PermCath placement today which went well Patient wants to defer bone marrow biopsy for now Kidney biopsy report shows unspecified amyloid. Further testing is pending  Objective:  Vital signs in last 24 hours:  Temp:  [97.6 F (36.4 C)-98.4 F (36.9 C)] 98.4 F (36.9 C) (10/21 1024) Pulse Rate:  [74-96] 87 (10/21 1200) Resp:  [15-20] 17 (10/21 1200) BP: (114-153)/(72-98) 141/89 mmHg (10/21 1200) SpO2:  [93 %-99 %] 95 % (10/21 1200) Weight:  [96.616 kg (213 lb)-96.707 kg (213 lb 3.2 oz)] 96.616 kg (213 lb) (10/21 1024)  Weight change: -0.408 kg (-14.4 oz) Filed Weights   04/15/15 0500 04/16/15 0500 04/16/15 1024  Weight: 97.115 kg (214 lb 1.6 oz) 96.707 kg (213 lb 3.2 oz) 96.616 kg (213 lb)    Intake/Output: I/O last 3 completed shifts: In: 240 [P.O.:240] Out: 775 [Urine:775]   Intake/Output this shift:     Physical Exam: General: No acute distress  Head: Normocephalic, atraumatic. Moist oral mucosal membranes  Eyes: Anicteric, pallor noted  Neck: Supple, trachea midline  Lungs:  Clear to auscultation normal effort, O2 [place] supplementation  Heart: Regular rate and rhythm no rubs  Abdomen:  Soft, No rebound, BS present  Extremities: no peripheral edema.  Neurologic: Nonfocal, moving all four extremities  Skin:  reddish blanching rash over the back, Some icterus notes  Access: Right femoral temp HD catheter. Rt IJ permcath    Basic Metabolic Panel:  Recent Labs Lab 04/12/15 0418 04/13/15 0501 04/14/15 0412 04/15/15 0440 04/16/15 0419 04/16/15 0420  NA 137  138 134* 138 138  138 139 139  K 3.4*  3.5 3.6 3.5 3.4*  3.4* 3.4* 3.4*  CL 99*  100* 97* 100* 100*  98* 100* 100*  CO2 31  32 28 31 32  32 31 32  GLUCOSE 125*  129* 114* 99 99  100* 103* 103*  BUN 39*  40* 60* 43* 54*  53* 59* 60*  CREATININE 3.67*  3.66* 5.18* 4.04* 4.87*  4.82* 5.31* 5.22*  CALCIUM 7.2*  7.4* 7.2* 7.4* 7.5*   7.5* 7.5* 7.5*  MG 2.0 2.0 2.0 2.0 2.0  --   PHOS 3.4 4.1 3.1 3.3  --  4.5    Liver Function Tests:  Recent Labs Lab 04/12/15 0418 04/13/15 0501 04/14/15 0412 04/15/15 0440 04/16/15 0420  AST 32  --   --   --   --   ALT 119*  --   --   --   --   ALKPHOS 195*  --   --   --   --   BILITOT 4.1*  --   --   --   --   PROT 5.4*  --   --   --   --   ALBUMIN 2.5*  2.5* 2.4* 2.5* 2.5* 2.4*   No results for input(s): LIPASE, AMYLASE in the last 168 hours. No results for input(s): AMMONIA in the last 168 hours.  CBC:  Recent Labs Lab 04/11/15 0430  04/12/15 0418  04/13/15 0501 04/13/15 1505 04/15/15 0440 04/15/15 1146 04/16/15 0419  WBC 17.3*  --  16.7*  --   --   --  15.1* 15.3* 14.1*  HGB 6.8*  < > 7.2*  < > 6.6* 9.3* 9.2* 9.9* 9.6*  HCT 20.1*  < > 21.4*  < > 20.1* 27.3* 27.8* 29.1* 29.2*  MCV 85.9  --  86.0  --   --   --  87.1 87.0 87.3  PLT 158  --  158  --   --   --  288 334 370  < > = values in this interval not displayed.  Cardiac Enzymes:  Recent Labs Lab 04/15/15 1146  TROPONINI 0.08*    BNP: Invalid input(s): POCBNP  CBG:  Recent Labs Lab 04/09/15 1617  GLUCAP 113*    Microbiology: Results for orders placed or performed during the hospital encounter of 04/06/15  MRSA PCR Screening     Status: None   Collection Time: 04/07/15  9:49 AM  Result Value Ref Range Status   MRSA by PCR NEGATIVE NEGATIVE Final    Comment:        The GeneXpert MRSA Assay (FDA approved for NASAL specimens only), is one component of a comprehensive MRSA colonization surveillance program. It is not intended to diagnose MRSA infection nor to guide or monitor treatment for MRSA infections.   Body fluid culture     Status: None (Preliminary result)   Collection Time: 04/15/15  9:45 AM  Result Value Ref Range Status   Specimen Description PLEURAL  Final   Special Requests NONE  Final   Gram Stain PENDING  Incomplete   Culture NO GROWTH < 12 HOURS  Final   Report  Status PENDING  Incomplete    Coagulation Studies:  Recent Labs  04/15/15 1146  LABPROT 14.1  INR 1.07    Urinalysis: No results for input(s): COLORURINE, LABSPEC, PHURINE, GLUCOSEU, HGBUR, BILIRUBINUR, KETONESUR, PROTEINUR, UROBILINOGEN, NITRITE, LEUKOCYTESUR in the last 72 hours.  Invalid input(s): APPERANCEUR    Imaging: Dg Chest 2 View  04/15/2015  CLINICAL DATA:  49 year old male status post left-sided thoracentesis EXAM: CHEST  2 VIEW COMPARISON:  Prior chest x-ray 04/14/2015 FINDINGS: No evidence of pneumothorax. Reduced left-sided pleural effusion. Persistent low lung volumes with bibasilar atelectasis. No acute osseous abnormality. IMPRESSION: No evidence of pneumothorax following left thoracentesis. Near-total resolution of left pleural effusion. Persistent low inspiratory volumes with bibasilar atelectasis. Electronically Signed   By: Jacqulynn Cadet M.D.   On: 04/15/2015 15:44   Dg Chest Port 1 View  04/15/2015  CLINICAL DATA:  Post thoracentesis, LEFT pleural effusion, cough EXAM: PORTABLE CHEST 1 VIEW COMPARISON:  Portable exam 1831 hours compared to 04/15/2015 FINDINGS: Enlargement of cardiac silhouette. Prominent mediastinum unchanged. Low lung volumes with bibasilar effusions and atelectasis. No pneumothorax. Bones unremarkable. IMPRESSION: No pneumothorax post thoracentesis. Enlargement of cardiac silhouette with low lung volumes, bibasilar pleural effusions and bibasilar atelectasis. Electronically Signed   By: Lavonia Dana M.D.   On: 04/15/2015 19:19   US Thoracentesis Asp Pleural Space W/img Guide  04/15/2015  CLINICAL DATA:  49 year old male with left-sided pleural effusion EXAM: ULTRASOUND GUIDED LEFT THORACENTESIS COMPARISON:  Chest x-ray 04/14/2015 PROCEDURE: An ultrasound guided thoracentesis was thoroughly discussed with the patient and questions answered. The benefits, risks, alternatives and complications were also discussed. The patient understands and  wishes to proceed with the procedure. Written consent was obtained. Ultrasound was performed to localize and mark an adequate pocket of fluid in the left chest. The area was then prepped and draped in the normal sterile fashion. 1% Lidocaine was used for local anesthesia. Under ultrasound guidance a 6 [place] Safe T centesis catheter was introduced. Thoracentesis was performed. The catheter was removed and a dressing applied. COMPLICATIONS: None. FINDINGS: A total of approximately 1200 mL of bloody pleural fluid was removed. A fluid sample wassent for laboratory analysis. IMPRESSION: Successful ultrasound guided left thoracentesis yielding 1.2 L of  bloody pleural fluid. Electronically Signed   By: Jacqulynn Cadet M.D.   On: 04/15/2015 15:08     Medications:   . sodium chloride    . sodium chloride 10 mL/hr at 04/16/15 1025   . antiseptic oral rinse  7 mL Mouth Rinse BID  . Influenza vac split quadrivalent PF  0.5 mL Intramuscular Tomorrow-1000  . pantoprazole  40 mg Oral BID AC  . rosuvastatin  10 mg Oral q1800   sodium chloride, sodium chloride, acetaminophen, calamine, HYDROmorphone (DILAUDID) injection, ondansetron (ZOFRAN) IV, oxyCODONE-acetaminophen  Assessment/ Plan:  50 y.o. male with past medical history of hypertension, hyperlipidemia, anemia chronic kidney disease, peptic ulcer disease, CKD stage IV, anemia of CKD proteinuria, hyperkalemia s/p renal bioy 04/06/15 with subsequent left perinephric hematoma.  1.  Acute renal failure/CKD stage IV/Proteinuria:  Likely ESRD - Pt s/p left renal biopsy. Perinephric hematoma developed which was initially subcapsular, subsequently extracapsular bleeding noted.   - Kidney biopsy shows severe amyloidosis which is unspecified (not AA or AL). Severe interstitial fibrosis and tubular atrophy - case discussed with patient's wife and mother and explained outpatient dialysis process in detail - including options of peritoneal dialysis at home  hemodialysis. Transplant will come later, although we discussed the process. - Electrolytes and Volume status are acceptable No acute indication for Dialysis at present  - We will reevaluate on a daily basis  2.  Left perinephric hematoma:  Noted post biopsy, was initially subcapsular, now extracapsular.   -s/p embolization of left kidney (done on Sunday)  3.  Post hemorrhagic anemia:  hgb trending down slowly Iron deficiency- iv iron with HD - Multiple units of blood transfusion given this admission  4.  Hyperkalemia:  K currently 3.4 and acceptable.  - encourage regular diet  5. Large left pleural effusion - s/p pleural tap  - ? Reactive from intra-abdominal bleed  6. Amyloidosis, unspecified - Patient has deferred bone marrow biopsy for now - Treatment plan as per hematology team   LOS: 9 Tuesday [place] 10/21/20161:12 PM

## 2015-04-16 NOTE — Progress Notes (Signed)
Nutrition Follow-up     INTERVENTION:  Meals and snacks: Cater to pt preferences once back on po diet Nutrition diet education: Pt and family requesting renal diet education materials.  Provided for pt.  Teachback used, expect good compliance.    NUTRITION DIAGNOSIS:   Increased nutrient needs related to acute illness as evidenced by estimated needs, being addressed with po intake    GOAL:   Patient will meet greater than or equal to 90% of their needs  Progressing towards meeting needs  MONITOR:    (Energy Intake, Electrolyte and Renal Profile, Anthropometrics, UOP, Digestive system)  REASON FOR ASSESSMENT:   Malnutrition Screening Tool    ASSESSMENT:     Planning permacath placement today for HD.   Current Nutrition: NPO, reports appetite is getting better noted 100%, 50% of meals per I and O sheet recently   Gastrointestinal Profile: Last BM: 10/19   Scheduled Medications:  . antiseptic oral rinse  7 mL Mouth Rinse BID  . Influenza vac split quadrivalent PF  0.5 mL Intramuscular Tomorrow-1000  . pantoprazole  40 mg Oral BID AC  . rosuvastatin  10 mg Oral q1800       Electrolyte/Renal Profile and Glucose Profile:   Recent Labs Lab 04/14/15 0412 04/15/15 0440 04/16/15 0419 04/16/15 0420  NA 138 138  138 139 139  K 3.5 3.4*  3.4* 3.4* 3.4*  CL 100* 100*  98* 100* 100*  CO2 31 32  32 31 32  BUN 43* 54*  53* 59* 60*  CREATININE 4.04* 4.87*  4.82* 5.31* 5.22*  CALCIUM 7.4* 7.5*  7.5* 7.5* 7.5*  MG 2.0 2.0 2.0  --   PHOS 3.1 3.3  --  4.5  GLUCOSE 99 99  100* 103* 103*   Protein Profile:  Recent Labs Lab 04/14/15 0412 04/15/15 0440 04/16/15 0420  ALBUMIN 2.5* 2.5* 2.4*        Weight Trend since Admission: Filed Weights   04/14/15 2100 04/15/15 0500 04/16/15 0500  Weight: 214 lb 1.6 oz (97.115 kg) 214 lb 1.6 oz (97.115 kg) 213 lb 3.2 oz (96.707 kg)      Diet Order:  Diet NPO time specified Except for: Sips with  Meds  Skin:  Reviewed, no issues   Height:   Ht Readings from Last 1 Encounters:  04/13/15 5\' 11"  (1.803 m)    Weight:   Wt Readings from Last 1 Encounters:  04/16/15 213 lb 3.2 oz (96.707 kg)        BMI:  Body mass index is 29.75 kg/(m^2).  Estimated Nutritional Needs:   Kcal:  BEE: 1610kcals, TEE: (IF 1.1-1.3)(AF 1.2) 2125-2511kcals  Protein:  107-135g protein (1.2-1.5g/kg)  Fluid:  UOP+1L  EDUCATION NEEDS:   Education needs no appropriate at this time  LOW Care Level   Rigo Letts B. Zenia Resides, [place], [place] (pager)

## 2015-04-16 NOTE — Progress Notes (Signed)
Called patient's room and confirmed that he does not want to do BM biopsy at present He wants to go ahead with Permcath placement

## 2015-04-16 NOTE — H&P (Signed)
[facility] History & Physical Update  The patient was interviewed and re-examined.  The patient's previous History and Physical has been reviewed and is unchanged.  There is no change in the plan of care. We plan to proceed with the scheduled procedure.  Schnier, [name], MD  04/16/2015, 11:20 AM

## 2015-04-16 NOTE — Progress Notes (Signed)
[name]    MR#:  [id number]  DATE OF BIRTH:  1965-09-08  SUBJECTIVE:  CHIEF COMPLAINT:  No chief complaint on file. perinephric hematoma- s/p embolization , large pleural effusion- s/p tap- permacath placed.  pain under control. Pt want to defer bone marrow biopsy for now.   REVIEW OF SYSTEMS:  CONSTITUTIONAL: No fever, generalized weakness.  EYES: No blurred or double vision.  EARS, NOSE, AND THROAT: No tinnitus or ear pain.  RESPIRATORY: No cough,positive for shortness of breath, no wheezing or hemoptysis.  CARDIOVASCULAR: have severe chest pain- after procedure , no orthopnea, edema.  GASTROINTESTINAL: has nausea, no vomiting, diarrhea, but has abdominal pain.  GENITOURINARY: No dysuria, hematuria.  ENDOCRINE: No polyuria, nocturia,  HEMATOLOGY: No anemia, easy bruising or bleeding SKIN: No rash or lesion. MUSCULOSKELETAL: Worsening back pain.   NEUROLOGIC: No tingling, numbness, weakness.  PSYCHIATRY: No anxiety or depression.   DRUG ALLERGIES:   Allergies  Allergen Reactions  . Ciprofloxacin Hcl   . Doxycycline Hyclate     VITALS:  Blood pressure 143/84, pulse 76, temperature 98.4 F (36.9 C), temperature source Oral, resp. rate 16, height _0  (1.803 m), weight 96.616 kg (213 lb), SpO2 96 %.  PHYSICAL EXAMINATION:  GENERAL:  49 y.o.-year-old patient lying in the bed with no acute distress.  EYES: Pupils equal, round, reactive to light and accommodation. No scleral icterus. Extraocular muscles intact.  HEENT: Head atraumatic, normocephalic. Oropharynx and nasopharynx clear. Moist oral mucosa. NECK:  Supple, no jugular venous distention. No thyroid enlargement, no tenderness.  LUNGS: equal breath sound both side, no wheezing, rales,rhonchi or crepitation. No use of accessory muscles of respiration. Having shallow breaths to prevent pain, no bleeding noted from dressing site- post  procedure. I tried to sit him up to examine his back at the site- which caused him extreme pain. CARDIOVASCULAR: S1, S2 normal.tachycardia with pain, No murmurs, rubs, or gallops.  ABDOMEN: Soft, nontender, nondistended. Bowel sounds present. No organomegaly or mass.  EXTREMITIES: No pedal edema, cyanosis, or clubbing.  NEUROLOGIC: Cranial nerves II through XII are intact. Muscle strength 4/5 in all extremities. Sensation intact. Gait not checked.  PSYCHIATRIC: The patient is alert and oriented x 3.  SKIN: No obvious rash, lesion, or ulcer.    LABORATORY PANEL:   CBC  Recent Labs Lab 04/16/15 0419  WBC 14.1*  HGB 9.6*  HCT 29.2*  PLT 370   ------------------------------------------------------------------------------------------------------------------  Chemistries   Recent Labs Lab 04/12/15 0418  04/16/15 0419 04/16/15 0420  NA 137  138  < > 139 139  K 3.4*  3.5  < > 3.4* 3.4*  CL 99*  100*  < > 100* 100*  CO2 31  32  < > 31 32  GLUCOSE 125*  129*  < > 103* 103*  BUN 39*  40*  < > 59* 60*  CREATININE 3.67*  3.66*  < > 5.31* 5.22*  CALCIUM 7.2*  7.4*  < > 7.5* 7.5*  MG 2.0  < > 2.0  --   AST 32  --   --   --   ALT 119*  --   --   --   ALKPHOS 195*  --   --   --   BILITOT 4.1*  --   --   --   < > = values in this interval not displayed. ------------------------------------------------------------------------------------------------------------------  Cardiac Enzymes  Recent Labs Lab 04/15/15 1146  TROPONINI 0.08*   ------------------------------------------------------------------------------------------------------------------  RADIOLOGY:  Dg Chest 2 View  04/15/2015  CLINICAL DATA:  49 year old male status post left-sided thoracentesis EXAM: CHEST  2 VIEW COMPARISON:  Prior chest x-ray 04/14/2015 FINDINGS: No evidence of pneumothorax. Reduced left-sided pleural effusion. Persistent low lung volumes with bibasilar atelectasis. No acute osseous  abnormality. IMPRESSION: No evidence of pneumothorax following left thoracentesis. Near-total resolution of left pleural effusion. Persistent low inspiratory volumes with bibasilar atelectasis. Electronically Signed   By: Jacqulynn Cadet M.D.   On: 04/15/2015 15:44   Dg Chest Port 1 View  04/15/2015  CLINICAL DATA:  Post thoracentesis, LEFT pleural effusion, cough EXAM: PORTABLE CHEST 1 VIEW COMPARISON:  Portable exam 1831 hours compared to 04/15/2015 FINDINGS: Enlargement of cardiac silhouette. Prominent mediastinum unchanged. Low lung volumes with bibasilar effusions and atelectasis. No pneumothorax. Bones unremarkable. IMPRESSION: No pneumothorax post thoracentesis. Enlargement of cardiac silhouette with low lung volumes, bibasilar pleural effusions and bibasilar atelectasis. Electronically Signed   By: Lavonia Dana M.D.   On: 04/15/2015 19:19   US Thoracentesis Asp Pleural Space W/img Guide  04/15/2015  CLINICAL DATA:  49 year old male with left-sided pleural effusion EXAM: ULTRASOUND GUIDED LEFT THORACENTESIS COMPARISON:  Chest x-ray 04/14/2015 PROCEDURE: An ultrasound guided thoracentesis was thoroughly discussed with the patient and questions answered. The benefits, risks, alternatives and complications were also discussed. The patient understands and wishes to proceed with the procedure. Written consent was obtained. Ultrasound was performed to localize and mark an adequate pocket of fluid in the left chest. The area was then prepped and draped in the normal sterile fashion. 1% Lidocaine was used for local anesthesia. Under ultrasound guidance a 6 [place] Safe T centesis catheter was introduced. Thoracentesis was performed. The catheter was removed and a dressing applied. COMPLICATIONS: None. FINDINGS: A total of approximately 1200 mL of bloody pleural fluid was removed. A fluid sample wassent for laboratory analysis. IMPRESSION: Successful ultrasound guided left thoracentesis yielding 1.2 L of  bloody pleural fluid. Electronically Signed   By: Jacqulynn Cadet M.D.   On: 04/15/2015 15:08     ASSESSMENT AND PLAN:   * Acute renal failure/CKD stage IV progressed to ESRD. Due to Amyloid deposits- as per primary biopsy report. He got CRRT for 3 days and need HD. Per Dr. Holley Raring ,  on HD for now. Continue to monitor electrolytes and UOP closely.Possible AA amyloid noted on prelim renal biopsy per Dr. Candiss Norse.  permacath placed 04/16/15  * Amyloidosis   Likely diagnosis from renal biopsy, Appreciated help by hematology.    Bone marrow biopsy suggested by Dr. Grayland Ormond for staging , pt does not want it now- want to feel little better first.     Informed hematologist- that pt would like to know more about staging, treatment options and out come of this disease.  *  left scapsular hematoma after renal biopsy.   Perinephric hematoma developed which was initially subcapsular, later with extracapsular bleeding.  Per CT abd worsening of hematoma, along with possible adrenal gland hemorrhage.   embolization of left kidney by vascular on 04/11/15. Since then Hb stable.  * pleural effusion    S/p US guided tap- 04/15/15- caused 1 ltr bloody fluid removal.    Followed by severe pain, vitals stable.    No pneumothorax.    stable CBC and INR.    Slightly high troponin is expected in renal failure    Talked to pulmonologist for follow up.    Dilaudid inj for pain as needed for now.  Pain is much better controlled now- will try to taper oxygen use.  * Hyperkalemia. Improved.  *  Hpotension: improved.  *Acute blood loss anemia due to 1. Hb increased from 6.6 to 7.5 after 2 unit PRBC transfusion.    Hb dropped again- 2 more units PRBC transfusion on 04/13/15.   Follow closely. Stable for now.  * Hypomagnesemia. Mag supplement. Improved.  * Leukocytosis. Possible due to hematoma. F/u CBC.  * Thrombocytopenia. Improved after given platelet transfusion and vitamin K. He got 20 mg of  DDAVP IV twice per Dr. Isidore Moos. follow-up CBC.  * Hypoxia and tachycardia, due to symptomatic anemia/ESRD. On O2 [place] 5 L.   Some atelactesis on X ray and CT of chest- encouraged incentive spirometry.  Xray reported large pleural effusion, s/p pleural tap now on 04/15/15.  I discussed with Dr. Candiss Norse and pt's mother and wife present in room.  All the records are reviewed and case discussed with Care Management/Social Workerr. Management plans discussed with the patient,  and mother, and they are in agreement.  CODE STATUS: Full code.  TOTALTIME TAKING CARE OF THIS PATIENT: 30 minutes.   POSSIBLE D/C IN >3 DAYS, DEPENDING ON CLINICAL CONDITION.   Vaughan Basta M.D on 04/16/2015 at 10:20 PM  Between 7am to 6pm - Pager - [phone]  After 6pm go to [email or web address] - password [email or web address]  Office  [phone]  CC: Primary care physician; Pcp Not In System

## 2015-04-16 NOTE — Progress Notes (Signed)
Right femoral catheter was removed. Dressing applied for 30 minutes. Will continue to monitor pt.  Angus Seller

## 2015-04-16 NOTE — OR Nursing (Signed)
Called Dr Candiss Norse regarding scheduled Bone Marrow biopsy, he said it was cancelled at this time.

## 2015-04-16 NOTE — Progress Notes (Signed)
* [facility]     Assessment and Plan:  Left hemorrhagic pleural effusion. - most likely  secondary to the patient's recent left kidney hematoma. The patient is now status post thoracentesis with 1 L of pleural fluid which was apparently bloody.  - repeat CXR with no PTX -The patient and his family that he may require another thoracentesis if the fluid recurs over the next few days and weeks.  Left pleuritic chest pain. -Likely secondary to above.  - Dilaudid dose 2 mg every 2 hours as needed. -No NSAIDs at this point due to the patient's acute renal failure. - cont with incentive spirometry   Acute renal failure. -Consistent with the patient's kidney biopsy which showed amyloidosis, nephrology is following. - s\p RIJ HD cath placement (permcath)  Left renal hematoma. -Status post left kidney biopsy, the patient developed postoperative hemorrhagic shock requiring multiple blood products and embolization. This now appears to be stable. The patient's hemoglobins have stabilized. - no bowel movement in the last 2 days, consider stool softner if still with no BM in the next 24 hours.    Date: 04/16/2015  MRN# [id number] [employer] 03/12/1966  Subjective Now s\p RIJ HD cath (permcath), doing well today, pain managed well.  Mild discomfort with deep breathing, no BM in the last 2 days.    ADMISSION DATE: 04/06/2015 INITIAL PRESENTATION:  69 M underwent elective L kidney bx 10/10 c/b severe hemorrhage and hemorrhagic shock  Events: 10/21>>thoracentesis, removal of 1 L of bloody fluid (appearance).  10/11 CTAP: Moderately large left subcapsular hematoma with mild compression of the left renal parenchyma 10/12 CTAP: Moderately large subcapsular hematoma around the left kidney appears about the same as yesterday. Hemorrhagic stranding in the left perirenal fat is about the same. There is increasing hemorrhage in the retroperitoneum, celiac axis, gastrohepatic  ligament, posterior subdiaphragmatic space, and with development of free fluid, likely intraperitoneal hemorrhage around the liver and extending into the pelvis. Enlarged retroperitoneal lymph nodes similar to previous study. New left pleural effusion with basilar atelectasis 10/12 CRRT initiated  INDWELLING DEVICES:: RIJ permcath placement>>10/20 R femoral HS cath 10/11 >>   Allergies:  Ciprofloxacin hcl and Doxycycline hyclate  Review of Systems: Gen:  Denies  fever, sweats. HEENT: Denies blurred vision. Cvc:  No dizziness, chest pain or heaviness Resp:   Denies cough or sputum porduction. Gi: Denies swallowing difficulty, stomach pain. constipation, bowel incontinence Gu:  Denies bladder incontinence, burning urine Ext:   No Joint pain, stiffness. Skin: No skin rash, easy bruising. Endoc:  No polyuria, polydipsia. Psych: No depression, insomnia. Other:  All other systems were reviewed and found to be negative other than what is mentioned in the HPI.   Physical Examination:   VS: BP 144/86 mmHg  Pulse 92  Temp(Src) 98.4 F (36.9 C) (Oral)  Resp 16  Ht 5\' 11"  (1.803 m)  Wt 213 lb (96.616 kg)  BMI 29.72 kg/m2  SpO2 93%  General Appearance: No distress  Neuro:without focal findings,  speech normal,  HEENT: PERRLA, EOM intact. Pulmonary: normal breath sounds, No wheezing.   CardiovascularNormal S1,S2.  No m/r/g.   Abdomen: Benign, Soft, non-tender. Renal:  No costovertebral tenderness  GU:  Not performed at this time. Endoc: No evident thyromegaly, no signs of acromegaly. Skin:   warm, no rash. Extremities: normal, no cyanosis, clubbing.   LABORATORY PANEL:   CBC  Recent Labs Lab 04/16/15 0419  WBC 14.1*  HGB 9.6*  HCT 29.2*  PLT 370   ------------------------------------------------------------------------------------------------------------------  Chemistries   Recent Labs Lab 04/12/15 0418  04/16/15 0419 04/16/15 0420  NA 137  138  < > 139 139  K  3.4*  3.5  < > 3.4* 3.4*  CL 99*  100*  < > 100* 100*  CO2 31  32  < > 31 32  GLUCOSE 125*  129*  < > 103* 103*  BUN 39*  40*  < > 59* 60*  CREATININE 3.67*  3.66*  < > 5.31* 5.22*  CALCIUM 7.2*  7.4*  < > 7.5* 7.5*  MG 2.0  < > 2.0  --   AST 32  --   --   --   ALT 119*  --   --   --   ALKPHOS 195*  --   --   --   BILITOT 4.1*  --   --   --   < > = values in this interval not displayed. ------------------------------------------------------------------------------------------------------------------  Cardiac Enzymes  Recent Labs Lab 04/15/15 1146  TROPONINI 0.08*   ------------------------------------------------------------  RADIOLOGY:   No results found for this or any previous visit. Results for orders placed during the hospital encounter of 04/06/15  DG Chest 2 View   Narrative CLINICAL DATA:  Renal biopsy 1 week ago with bleeding subsequently, followup pleural effusion  EXAM: CHEST  2 VIEW  COMPARISON:  04/12/2015  FINDINGS: Right lung remains clear. Stable cardiac enlargement. Fairly extensive hazy density over the mid to lower left lung zone unchanged. Lateral view indicates that this is related to a large left pleural effusion, with no interval change.  IMPRESSION: Large left pleural effusion unchanged   Electronically Signed   By: Skipper Cliche M.D.   On: 04/14/2015 10:06    ------------------------------------------------------------------------------------------------------------------  Thank  you for allowing [facility] to assist in the care of your patient. Our recommendations are noted above.  Please contact us if we can be of further service.  Pulmonary Consult time = 28mins  Vilinda Boehringer, MD [facility] Pager [phone] (please enter 7-digits) On Call Pager - [phone] (please enter 7-digits)

## 2015-04-17 LAB — CBC
HCT: 28.7 % — ABNORMAL LOW (ref 40.0–52.0)
HEMOGLOBIN: 9.8 g/dL — AB (ref 13.0–18.0)
MCH: 29.6 pg (ref 26.0–34.0)
MCHC: 34 g/dL (ref 32.0–36.0)
MCV: 87.3 fL (ref 80.0–100.0)
PLATELETS: 440 10*3/uL (ref 150–440)
RBC: 3.29 MIL/uL — AB (ref 4.40–5.90)
RDW: 15.5 % — ABNORMAL HIGH (ref 11.5–14.5)
WBC: 14.8 10*3/uL — AB (ref 3.8–10.6)

## 2015-04-17 LAB — RENAL FUNCTION PANEL
ANION GAP: 6 (ref 5–15)
Albumin: 2.3 g/dL — ABNORMAL LOW (ref 3.5–5.0)
BUN: 59 mg/dL — ABNORMAL HIGH (ref 6–20)
CHLORIDE: 103 mmol/L (ref 101–111)
CO2: 29 mmol/L (ref 22–32)
Calcium: 7.5 mg/dL — ABNORMAL LOW (ref 8.9–10.3)
Creatinine, Ser: 5.06 mg/dL — ABNORMAL HIGH (ref 0.61–1.24)
GFR, EST AFRICAN AMERICAN: 14 mL/min — AB (ref >60)
GFR, EST NON AFRICAN AMERICAN: 12 mL/min — AB (ref >60)
Glucose, Bld: 109 mg/dL — ABNORMAL HIGH (ref 65–99)
POTASSIUM: 3.4 mmol/L — AB (ref 3.5–5.1)
Phosphorus: 3.8 mg/dL (ref 2.5–4.6)
Sodium: 138 mmol/L (ref 135–145)

## 2015-04-17 LAB — MAGNESIUM: MAGNESIUM: 1.8 mg/dL (ref 1.7–2.4)

## 2015-04-17 MED ORDER — NEPRO/CARBSTEADY PO LIQD
237.0000 mL | Freq: Two times a day (BID) | ORAL | Status: DC
  Administered 2015-04-17 – 2015-04-20 (×7): 237 mL via ORAL

## 2015-04-17 NOTE — Progress Notes (Signed)
Subjective:    Pain from thoracentesis is better controlled. Using oral tablets PermCath placed on Friday Patient wants to defer bone marrow biopsy for now Kidney biopsy report shows unspecified amyloid. Further testing is pending Vas-Cath was removed from right femoral area. He experienced excessive leading after catheter was removed. Currently he has pressure dressing in place. Repeat hemoglobin 9.8 which is stable  Objective:  Vital signs in last 24 hours:  Temp:  [97.5 F (36.4 C)-98.4 F (36.9 C)] 98 F (36.7 C) (10/22 1241) Pulse Rate:  [73-85] 73 (10/22 1241) Resp:  [16-18] 16 (10/22 1241) BP: (136-155)/(84-91) 155/91 mmHg (10/22 1241) SpO2:  [95 %-98 %] 97 % (10/22 1241) Weight:  [90.13 kg (198 lb 11.2 oz)] 90.13 kg (198 lb 11.2 oz) (10/22 0500)  Weight change: -0.091 kg (-3.2 oz) Filed Weights   04/16/15 0500 04/16/15 1024 04/17/15 0500  Weight: 96.707 kg (213 lb 3.2 oz) 96.616 kg (213 lb) 90.13 kg (198 lb 11.2 oz)    Intake/Output: I/O last 3 completed shifts: In: 77.4 [I.V.:77.4] Out: 2925 [Urine:2925]   Intake/Output this shift:  Total I/O In: 360 [P.O.:360] Out: 900 [Urine:900]  Physical Exam: General: No acute distress  Head: Normocephalic, atraumatic. Moist oral mucosal membranes  Eyes: Anicteric, pallor noted  Neck: Supple, trachea midline  Lungs:  Clear to auscultation normal effort, O2 [place] supplementation  Heart: Regular rate and rhythm no rubs  Abdomen:  Soft, No rebound, BS present  Extremities: no peripheral edema.  Neurologic: Nonfocal, moving all four extremities  Skin:  reddish blanching rash over the back, Some icterus notes  Access: Right femoral pressure dressing. Rt IJ permcath    Basic Metabolic Panel:  Recent Labs Lab 04/13/15 0501 04/14/15 0412 04/15/15 0440 04/16/15 0419 04/16/15 0420 04/17/15 0432 04/17/15 0442  NA 134* 138 138  138 139 139 138  --   K 3.6 3.5 3.4*  3.4* 3.4* 3.4* 3.4*  --   CL 97* 100* 100*  98* 100*  100* 103  --   CO2 28 31 32  32 31 32 29  --   GLUCOSE 114* 99 99  100* 103* 103* 109*  --   BUN 60* 43* 54*  53* 59* 60* 59*  --   CREATININE 5.18* 4.04* 4.87*  4.82* 5.31* 5.22* 5.06*  --   CALCIUM 7.2* 7.4* 7.5*  7.5* 7.5* 7.5* 7.5*  --   MG 2.0 2.0 2.0 2.0  --   --  1.8  PHOS 4.1 3.1 3.3  --  4.5 3.8  --     Liver Function Tests:  Recent Labs Lab 04/12/15 0418 04/13/15 0501 04/14/15 0412 04/15/15 0440 04/16/15 0420 04/17/15 0432  AST 32  --   --   --   --   --   ALT 119*  --   --   --   --   --   ALKPHOS 195*  --   --   --   --   --   BILITOT 4.1*  --   --   --   --   --   PROT 5.4*  --   --   --   --   --   ALBUMIN 2.5*  2.5* 2.4* 2.5* 2.5* 2.4* 2.3*   No results for input(s): LIPASE, AMYLASE in the last 168 hours. No results for input(s): AMMONIA in the last 168 hours.  CBC:  Recent Labs Lab 04/12/15 0418  04/13/15 1505 04/15/15 0440 04/15/15 1146 04/16/15 0419 04/17/15  0432  WBC 16.7*  --   --  15.1* 15.3* 14.1* 14.8*  HGB 7.2*  < > 9.3* 9.2* 9.9* 9.6* 9.8*  HCT 21.4*  < > 27.3* 27.8* 29.1* 29.2* 28.7*  MCV 86.0  --   --  87.1 87.0 87.3 87.3  PLT 158  --   --  288 334 370 440  < > = values in this interval not displayed.  Cardiac Enzymes:  Recent Labs Lab 04/15/15 1146  TROPONINI 0.08*    BNP: Invalid input(s): POCBNP  CBG: No results for input(s): GLUCAP in the last 168 hours.  Microbiology: Results for orders placed or performed during the hospital encounter of 04/06/15  MRSA PCR Screening     Status: None   Collection Time: 04/07/15  9:49 AM  Result Value Ref Range Status   MRSA by PCR NEGATIVE NEGATIVE Final    Comment:        The GeneXpert MRSA Assay (FDA approved for NASAL specimens only), is one component of a comprehensive MRSA colonization surveillance program. It is not intended to diagnose MRSA infection nor to guide or monitor treatment for MRSA infections.   Body fluid culture     Status: None (Preliminary result)    Collection Time: 04/15/15  9:45 AM  Result Value Ref Range Status   Specimen Description PLEURAL  Final   Special Requests NONE  Final   Gram Stain FEW WBC SEEN NO ORGANISMS SEEN   Final   Culture NO GROWTH 2 DAYS  Final   Report Status PENDING  Incomplete    Coagulation Studies:  Recent Labs  04/15/15 1146  LABPROT 14.1  INR 1.07    Urinalysis: No results for input(s): COLORURINE, LABSPEC, PHURINE, GLUCOSEU, HGBUR, BILIRUBINUR, KETONESUR, PROTEINUR, UROBILINOGEN, NITRITE, LEUKOCYTESUR in the last 72 hours.  Invalid input(s): APPERANCEUR    Imaging: Dg Chest Port 1 View  04/15/2015  CLINICAL DATA:  Post thoracentesis, LEFT pleural effusion, cough EXAM: PORTABLE CHEST 1 VIEW COMPARISON:  Portable exam 1831 hours compared to 04/15/2015 FINDINGS: Enlargement of cardiac silhouette. Prominent mediastinum unchanged. Low lung volumes with bibasilar effusions and atelectasis. No pneumothorax. Bones unremarkable. IMPRESSION: No pneumothorax post thoracentesis. Enlargement of cardiac silhouette with low lung volumes, bibasilar pleural effusions and bibasilar atelectasis. Electronically Signed   By: Lavonia Dana M.D.   On: 04/15/2015 19:19     Medications:     . antiseptic oral rinse  7 mL Mouth Rinse BID  . feeding supplement (NEPRO CARB STEADY)  237 mL Oral BID BM  . Influenza vac split quadrivalent PF  0.5 mL Intramuscular Tomorrow-1000  . pantoprazole  40 mg Oral BID AC  . rosuvastatin  10 mg Oral q1800   acetaminophen, calamine, HYDROmorphone (DILAUDID) injection, ondansetron (ZOFRAN) IV, oxyCODONE-acetaminophen  Assessment/ Plan:  49 y.o. male with past medical history of hypertension, hyperlipidemia, anemia chronic kidney disease, peptic ulcer disease, CKD stage IV, anemia of CKD proteinuria, hyperkalemia s/p renal bioy 04/06/15 with subsequent left perinephric hematoma.  1.  Acute renal failure/CKD stage IV/Proteinuria:  Likely ESRD - Pt s/p left renal biopsy.  Perinephric hematoma developed which was initially subcapsular, subsequently extracapsular bleeding noted.   - Kidney biopsy shows severe amyloidosis which is unspecified (not AA or AL). Severe interstitial fibrosis and tubular atrophy - On Friday, case discussed with patient's wife and mother and explained outpatient dialysis process in detail - including options of peritoneal dialysis at home hemodialysis. Transplant will come later, although we discussed the process. -Urine output reported at  2925 cc. Electrolytes and Volume status are acceptable No acute indication for Dialysis at present  - We will continue to evaluate on a daily basis  2.  Left perinephric hematoma:  Noted post biopsy, was initially subcapsular, now extracapsular.   -s/p embolization of left kidney (done on Sunday)  3.  Anemia of blood loss/Post hemorrhagic anemia:  hgb trending down slowly Iron deficiency- iv iron with HD - Multiple units of blood transfusion given this admission  4.  Hyperkalemia:  K currently 3.4 and acceptable.  - encourage regular diet  5. Large left pleural effusion - s/p pleural tap  - ? Reactive from intra-abdominal bleed  6. Amyloidosis, unspecified - Patient has deferred bone marrow biopsy for now - Treatment plan as per hematology team   LOS: [place] 10/22/201612:50 PM

## 2015-04-18 ENCOUNTER — Inpatient Hospital Stay: Payer: BLUE CROSS/BLUE SHIELD

## 2015-04-18 DIAGNOSIS — R0600 Dyspnea, unspecified: Secondary | ICD-10-CM

## 2015-04-18 LAB — BASIC METABOLIC PANEL
ANION GAP: 8 (ref 5–15)
BUN: 63 mg/dL — ABNORMAL HIGH (ref 6–20)
CALCIUM: 7.8 mg/dL — AB (ref 8.9–10.3)
CO2: 30 mmol/L (ref 22–32)
CREATININE: 4.83 mg/dL — AB (ref 0.61–1.24)
Chloride: 103 mmol/L (ref 101–111)
GFR, EST AFRICAN AMERICAN: 15 mL/min — AB (ref >60)
GFR, EST NON AFRICAN AMERICAN: 13 mL/min — AB (ref >60)
Glucose, Bld: 101 mg/dL — ABNORMAL HIGH (ref 65–99)
Potassium: 3.7 mmol/L (ref 3.5–5.1)
SODIUM: 141 mmol/L (ref 135–145)

## 2015-04-18 LAB — BODY FLUID CULTURE: Culture: NO GROWTH

## 2015-04-18 LAB — MAGNESIUM: MAGNESIUM: 1.8 mg/dL (ref 1.7–2.4)

## 2015-04-18 LAB — CBC
HEMATOCRIT: 30.7 % — AB (ref 40.0–52.0)
HEMOGLOBIN: 10 g/dL — AB (ref 13.0–18.0)
MCH: 28.2 pg (ref 26.0–34.0)
MCHC: 32.5 g/dL (ref 32.0–36.0)
MCV: 86.9 fL (ref 80.0–100.0)
Platelets: 506 10*3/uL — ABNORMAL HIGH (ref 150–440)
RBC: 3.53 MIL/uL — ABNORMAL LOW (ref 4.40–5.90)
RDW: 15.8 % — AB (ref 11.5–14.5)
WBC: 17.3 10*3/uL — AB (ref 3.8–10.6)

## 2015-04-18 LAB — PHOSPHORUS: PHOSPHORUS: 4 mg/dL (ref 2.5–4.6)

## 2015-04-18 LAB — ALBUMIN: ALBUMIN: 2.3 g/dL — AB (ref 3.5–5.0)

## 2015-04-18 MED ORDER — ONDANSETRON 4 MG PO TBDP
4.0000 mg | ORAL_TABLET | ORAL | Status: DC | PRN
Start: 2015-04-18 — End: 2015-04-21
  Administered 2015-04-18 – 2015-04-20 (×3): 4 mg via ORAL
  Filled 2015-04-18 (×3): qty 1

## 2015-04-18 NOTE — Progress Notes (Signed)
* [facility]     Assessment and Plan:  Left hemorrhagic pleural effusion. - most likely  secondary to the patient's recent left kidney hematoma. The patient is now status post thoracentesis with 1 L of pleural fluid which was apparently bloody.  - repeat CXR with no PTX -The patient and his family that he may require another thoracentesis if the fluid recurs over the next few days and weeks. - patient with good clinical improvement. - follow up with Dr. Glennie Hawk in 3-4 wks at [facility] will call patient with follow up information) - may need outpatient CT chest in 2-3 months, to evaluate for any trap lung.   Left pleuritic chest pain - improving -Likely secondary to above.  - Dilaudid dose 2 mg every 2 hours as needed. -No NSAIDs at this point due to the patient's acute renal failure. - cont with incentive spirometry   Acute renal failure. -Consistent with the patient's kidney biopsy which showed amyloidosis, nephrology is following. - s\p RIJ HD cath placement (permcath)  Left renal hematoma. -Status post left kidney biopsy, the patient developed postoperative hemorrhagic shock requiring multiple blood products and embolization. This now appears to be stable. The patient's hemoglobins have stabilized. - no bowel movement in the last 2 days, consider stool softner if still with no BM in the next 24 hours.    Thank you for consulting [facility], we will signoff at this time.  Please feel free to contacts [place] with any questions.    Date: 04/18/2015  MRN# [id number] Joseph [name] Sep 24, 1965  Subjective Now s\p RIJ HD cath (permcath), doing well today, pain managed well.  Mild discomfort with deep breathing, no BM in the last 2 days.    ADMISSION DATE: 04/06/2015 INITIAL PRESENTATION:  39 M underwent elective L kidney bx 10/10 c/b severe hemorrhage and hemorrhagic shock  Events: 10/21>>thoracentesis, removal of 1 L of  bloody fluid (appearance).  10/11 CTAP: Moderately large left subcapsular hematoma with mild compression of the left renal parenchyma 10/12 CTAP: Moderately large subcapsular hematoma around the left kidney appears about the same as yesterday. Hemorrhagic stranding in the left perirenal fat is about the same. There is increasing hemorrhage in the retroperitoneum, celiac axis, gastrohepatic ligament, posterior subdiaphragmatic space, and with development of free fluid, likely intraperitoneal hemorrhage around the liver and extending into the pelvis. Enlarged retroperitoneal lymph nodes similar to previous study. New left pleural effusion with basilar atelectasis 10/12 CRRT initiated  INDWELLING DEVICES:: RIJ permcath placement>>10/20 R femoral HS cath 10/11 >>   Allergies:  Ciprofloxacin hcl and Doxycycline hyclate  Review of Systems: Gen:  Denies  fever, sweats. HEENT: Denies blurred vision. Cvc:  No dizziness, chest pain or heaviness Resp:   Denies cough or sputum porduction. Gi: Denies swallowing difficulty, stomach pain. constipation, bowel incontinence Gu:  Denies bladder incontinence, burning urine Ext:   No Joint pain, stiffness. Skin: No skin rash, easy bruising. Endoc:  No polyuria, polydipsia. Psych: No depression, insomnia. Other:  All other systems were reviewed and found to be negative other than what is mentioned in the HPI.   Physical Examination:   VS: BP 141/93 mmHg  Pulse 78  Temp(Src) 97.7 F (36.5 C) (Oral)  Resp 20  Ht 5\' 11"  (1.803 m)  Wt 197 lb 14.4 oz (89.767 kg)  BMI 27.61 kg/m2  SpO2 97%  General Appearance: No distress  Neuro:without focal findings,  speech normal,  HEENT: PERRLA, EOM intact. Pulmonary: normal breath sounds,  No wheezing.   CardiovascularNormal S1,S2.  No m/r/g.   Abdomen: Benign, Soft, non-tender. Renal:  No costovertebral tenderness  GU:  Not performed at this time. Endoc: No evident thyromegaly, no signs of acromegaly. Skin:    warm, no rash. Extremities: normal, no cyanosis, clubbing.   LABORATORY PANEL:   CBC  Recent Labs Lab 04/18/15 0546  WBC 17.3*  HGB 10.0*  HCT 30.7*  PLT 506*   ------------------------------------------------------------------------------------------------------------------  Chemistries   Recent Labs Lab 04/12/15 0418  04/18/15 0546  NA 137  138  < > 141  K 3.4*  3.5  < > 3.7  CL 99*  100*  < > 103  CO2 31  32  < > 30  GLUCOSE 125*  129*  < > 101*  BUN 39*  40*  < > 63*  CREATININE 3.67*  3.66*  < > 4.83*  CALCIUM 7.2*  7.4*  < > 7.8*  MG 2.0  < > 1.8  AST 32  --   --   ALT 119*  --   --   ALKPHOS 195*  --   --   BILITOT 4.1*  --   --   < > = values in this interval not displayed. ------------------------------------------------------------------------------------------------------------------  Cardiac Enzymes  Recent Labs Lab 04/15/15 1146  TROPONINI 0.08*   ------------------------------------------------------------  RADIOLOGY:   No results found for this or any previous visit. Results for orders placed during the hospital encounter of 04/06/15  DG Chest 2 View   Narrative CLINICAL DATA:  Renal biopsy 1 week ago with bleeding subsequently, followup pleural effusion  EXAM: CHEST  2 VIEW  COMPARISON:  04/12/2015  FINDINGS: Right lung remains clear. Stable cardiac enlargement. Fairly extensive hazy density over the mid to lower left lung zone unchanged. Lateral view indicates that this is related to a large left pleural effusion, with no interval change.  IMPRESSION: Large left pleural effusion unchanged   Electronically Signed   By: Skipper Cliche M.D.   On: 04/14/2015 10:06    ------------------------------------------------------------------------------------------------------------------  Thank  you for allowing [facility] to assist in the care of your patient. Our recommendations are noted above.   Please contact us if we can be of further service.  Pulmonary Consult time = 68mins  Vilinda Boehringer, MD [facility] Pager [phone] (please enter 7-digits) On Call Pager - [phone] (please enter 7-digits)

## 2015-04-18 NOTE — Progress Notes (Addendum)
[place] at [place] NAME: Joseph [name]    MR#:  [id number]  DATE OF BIRTH:  11-12-1965  SUBJECTIVE:  CHIEF COMPLAINT:  No chief complaint on file. perinephric hematoma- s/p embolization , large pleural effusion- s/p tap- permacath placed.  pain under control. Pt want to defer bone marrow biopsy for now. Decreased oxygen requirement.   REVIEW OF SYSTEMS:  CONSTITUTIONAL: No fever, generalized weakness.  EYES: No blurred or double vision.  EARS, NOSE, AND THROAT: No tinnitus or ear pain.  RESPIRATORY: No cough,positive for shortness of breath, no wheezing or hemoptysis.  CARDIOVASCULAR: have severe chest pain- after procedure , no orthopnea, edema.  GASTROINTESTINAL: has nausea, no vomiting, diarrhea, but has abdominal pain.  GENITOURINARY: No dysuria, hematuria.  ENDOCRINE: No polyuria, nocturia,  HEMATOLOGY: No anemia, easy bruising or bleeding SKIN: No rash or lesion. MUSCULOSKELETAL: Worsening back pain.   NEUROLOGIC: No tingling, numbness, weakness.  PSYCHIATRY: No anxiety or depression.   DRUG ALLERGIES:   Allergies  Allergen Reactions  . Ciprofloxacin Hcl   . Doxycycline Hyclate     VITALS:  Blood pressure 141/93, pulse 78, temperature 97.7 F (36.5 C), temperature source Oral, resp. rate 20, height _0  (1.803 m), weight 89.767 kg (197 lb 14.4 oz), SpO2 97 %.  PHYSICAL EXAMINATION:  GENERAL:  49 y.o.-year-old patient lying in the bed with no acute distress.  EYES: Pupils equal, round, reactive to light and accommodation. No scleral icterus. Extraocular muscles intact.  HEENT: Head atraumatic, normocephalic. Oropharynx and nasopharynx clear. Moist oral mucosa. NECK:  Supple, no jugular venous distention. No thyroid enlargement, no tenderness.  LUNGS: equal breath sound both side, no wheezing, rales,rhonchi or crepitation. No use of accessory muscles of respiration. Having shallow breaths to prevent pain, no bleeding  noted from dressing site- post procedure. I tried to sit him up to examine his back at the site- which caused him extreme pain. CARDIOVASCULAR: S1, S2 normal.tachycardia with pain, No murmurs, rubs, or gallops.  ABDOMEN: Soft, nontender, nondistended. Bowel sounds present. No organomegaly or mass.  EXTREMITIES: No pedal edema, cyanosis, or clubbing.  NEUROLOGIC: Cranial nerves II through XII are intact. Muscle strength 4/5 in all extremities. Sensation intact. Gait not checked.  PSYCHIATRIC: The patient is alert and oriented x 3.  SKIN: No obvious rash, lesion, or ulcer.    LABORATORY PANEL:   CBC  Recent Labs Lab 04/18/15 0546  WBC 17.3*  HGB 10.0*  HCT 30.7*  PLT 506*   ------------------------------------------------------------------------------------------------------------------  Chemistries   Recent Labs Lab 04/12/15 0418  04/18/15 0546  NA 137  138  < > 141  K 3.4*  3.5  < > 3.7  CL 99*  100*  < > 103  CO2 31  32  < > 30  GLUCOSE 125*  129*  < > 101*  BUN 39*  40*  < > 63*  CREATININE 3.67*  3.66*  < > 4.83*  CALCIUM 7.2*  7.4*  < > 7.8*  MG 2.0  < > 1.8  AST 32  --   --   ALT 119*  --   --   ALKPHOS 195*  --   --   BILITOT 4.1*  --   --   < > = values in this interval not displayed. ------------------------------------------------------------------------------------------------------------------  Cardiac Enzymes  Recent Labs Lab 04/15/15 1146  TROPONINI 0.08*   ------------------------------------------------------------------------------------------------------------------  RADIOLOGY:  No results found.   ASSESSMENT AND PLAN:   * Acute  renal failure/CKD stage IV progressed to ESRD. Due to Amyloid deposits- as per primary biopsy report. He got CRRT for 3 days and need HD. Per Dr. Holley Raring ,  on HD for now. Continue to monitor electrolytes and UOP closely.Possible AA amyloid noted on prelim renal biopsy per Dr. Candiss Norse.  permacath placed  04/16/15  further diagnostic by BM biopsy- pt want to defer for now.  nephrology to arrange for out pt HD.  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    Bone marrow biopsy suggested by Dr. Grayland Ormond for staging , pt does not want it now- want to feel little better first.     Informed hematologist- that pt would like to know more about staging, treatment options and out come of this disease.  *  left scapsular hematoma after renal biopsy.   Perinephric hematoma developed which was initially subcapsular, later with extracapsular bleeding.  Per CT abd worsening of hematoma, along with possible adrenal gland hemorrhage.   embolization of left kidney by vascular on 04/11/15. Since then Hb stable.  * pleural effusion    S/p US guided tap- 04/15/15- caused 1 ltr bloody fluid removal.   Likely secondary to his perinephric hematoma.    Followed by severe pain, vitals stable.    No pneumothorax.    stable CBC and INR.    Slightly high troponin is expected in renal failure    pulmonologist for follow up.    Dilaudid inj for pain as needed for now.    Pain is much better controlled     Oxygen requirement is coming down.  * Hyperkalemia. Improved.  *  Hpotension: improved.  *Acute blood loss anemia due to 1. Hb increased from 6.6 to 7.5 after 2 unit PRBC transfusion.    Hb dropped again- 2 more units PRBC transfusion on 04/13/15.   Follow closely. Stable for now.  * Hypomagnesemia. Mag supplement. Improved.  * Leukocytosis. Possible due to hematoma. F/u CBC.  * Thrombocytopenia. Improved after given platelet transfusion and vitamin K. He got 20 mg of DDAVP IV twice per Dr. Isidore Moos. follow-up CBC.  * Hypoxia and tachycardia, due to symptomatic anemia/ESRD.   Was on O2 [place] 5 L.   Some atelactesis on X ray and CT of chest- encouraged incentive spirometry.  Xray reported large pleural effusion, s/p pleural tap now on 04/15/15.   On minimal  requirement of oxygen now- taper to room air if possible.  I discussed with Dr. Candiss Norse and pt's mother and wife present in room.  All the records are reviewed and case discussed with Care Management/Social Workerr. Management plans discussed with the patient,  and mother, and they are in agreement.  CODE STATUS: Full code.  TOTALTIME TAKING CARE OF THIS PATIENT: 30 minutes.   POSSIBLE D/C IN >3 DAYS, DEPENDING ON CLINICAL CONDITION.   Vaughan Basta M.D on 04/18/2015 at 8:04 AM  Between 7am to 6pm - Pager - [phone]  After 6pm go to [email or web address] - password [email or web address] [facility]  Office  [phone]  CC: Primary care physician; Pcp Not In System

## 2015-04-18 NOTE — Progress Notes (Signed)
[name] at [place] NAME: Joseph [name]    MR#:  [id number]  DATE OF BIRTH:  [birth year]  SUBJECTIVE:  CHIEF COMPLAINT:  No chief complaint on file. perinephric hematoma- s/p embolization , large pleural effusion- s/p tap- permacath placed.  pain under control. Pt want to defer bone marrow biopsy for now. Decreased oxygen requirement.   REVIEW OF SYSTEMS:  CONSTITUTIONAL: No fever, generalized weakness.  EYES: No blurred or double vision.  EARS, NOSE, AND THROAT: No tinnitus or ear pain.  RESPIRATORY: No cough,positive for shortness of breath, no wheezing or hemoptysis.  CARDIOVASCULAR: have mild chest pain- after procedure , no orthopnea, edema.  GASTROINTESTINAL: has nausea, no vomiting, diarrhea, but has abdominal pain.  GENITOURINARY: No dysuria, hematuria.  ENDOCRINE: No polyuria, nocturia,  HEMATOLOGY: No anemia, easy bruising or bleeding SKIN: No rash or lesion. MUSCULOSKELETAL: Worsening back pain.   NEUROLOGIC: No tingling, numbness, weakness.  PSYCHIATRY: No anxiety or depression.   DRUG ALLERGIES:   Allergies  Allergen Reactions  . Ciprofloxacin Hcl   . Doxycycline Hyclate     VITALS:  Blood pressure 141/93, pulse 78, temperature 97.7 F (36.5 C), temperature source Oral, resp. rate 20, height _0  (1.803 m), weight 89.767 kg (197 lb 14.4 oz), SpO2 97 %.  PHYSICAL EXAMINATION:  GENERAL:  49 y.o.-year-old patient lying in the bed with no acute distress.  EYES: Pupils equal, round, reactive to light and accommodation. No scleral icterus. Extraocular muscles intact.  HEENT: Head atraumatic, normocephalic. Oropharynx and nasopharynx clear. Moist oral mucosa. NECK:  Supple, no jugular venous distention. No thyroid enlargement, no tenderness.  LUNGS: equal breath sound both side, no wheezing, rales,rhonchi or crepitation. No use of accessory muscles of respiration. permacath on right chest. CARDIOVASCULAR: S1, S2  normal.tachycardia with pain, No murmurs, rubs, or gallops.  ABDOMEN: Soft, nontender, nondistended. Bowel sounds present. No organomegaly or mass.  EXTREMITIES: No pedal edema, cyanosis, or clubbing.  NEUROLOGIC: Cranial nerves II through XII are intact. Muscle strength 4/5 in all extremities. Sensation intact. Gait not checked.  PSYCHIATRIC: The patient is alert and oriented x 3.  SKIN: No obvious rash, lesion, or ulcer.    LABORATORY PANEL:   CBC  Recent Labs Lab 04/18/15 0546  WBC 17.3*  HGB 10.0*  HCT 30.7*  PLT 506*   ------------------------------------------------------------------------------------------------------------------  Chemistries   Recent Labs Lab 04/12/15 0418  04/18/15 0546  NA 137  138  < > 141  K 3.4*  3.5  < > 3.7  CL 99*  100*  < > 103  CO2 31  32  < > 30  GLUCOSE 125*  129*  < > 101*  BUN 39*  40*  < > 63*  CREATININE 3.67*  3.66*  < > 4.83*  CALCIUM 7.2*  7.4*  < > 7.8*  MG 2.0  < > 1.8  AST 32  --   --   ALT 119*  --   --   ALKPHOS 195*  --   --   BILITOT 4.1*  --   --   < > = values in this interval not displayed. ------------------------------------------------------------------------------------------------------------------  Cardiac Enzymes  Recent Labs Lab 04/15/15 1146  TROPONINI 0.08*   ------------------------------------------------------------------------------------------------------------------  RADIOLOGY:  No results found.   ASSESSMENT AND PLAN:   * Acute renal failure/CKD stage IV progressed to ESRD. Due to Amyloid deposits- as per primary biopsy report. He got CRRT for 3 days and need HD. Per Dr. Holley Raring ,  on HD for now. Continue to monitor electrolytes and    UOP closely.Possible AA amyloid noted on prelim renal biopsy per Dr. Candiss Norse.  permacath placed 04/16/15  further diagnostic by BM biopsy- pt want to defer for now.  nephrology to arrange for out pt HD.  * Amyloidosis   Likely diagnosis from  renal biopsy, Appreciated help by hematology.    Bone marrow biopsy suggested by Dr. Grayland Ormond for staging , pt does not want it now- want to feel little better first.     Informed hematologist- that pt would like to know more about staging, treatment options and out come of this disease.  *  left scapsular hematoma after renal biopsy.   Perinephric hematoma developed which was initially subcapsular, later with extracapsular bleeding.  Per CT abd worsening of hematoma, along with possible adrenal gland hemorrhage.   embolization of left kidney by vascular on 04/11/15. Since then Hb stable.  * pleural effusion    S/p US guided tap- 04/15/15- caused 1 ltr bloody fluid removal.   Likely secondary to his perinephric hematoma.    Followed by severe pain, vitals stable.    No pneumothorax.    stable CBC and INR.    Slightly high troponin is expected in renal failure    pulmonologist for follow up.    Dilaudid inj for pain as needed for now.    Pain is much better controlled     Oxygen requirement is coming down.     Will taper oxygen to room air, and control pain only with oral meds as a preparation of possible discharge early next week.  * Hyperkalemia. Improved.  *  Hpotension: improved.  *Acute blood loss anemia due to 1. Hb increased from 6.6 to 7.5 after 2 unit PRBC transfusion.    Hb dropped again- 2 more units PRBC transfusion on 04/13/15.   Follow closely. Stable for now.  * Hypomagnesemia. Mag supplement. Improved.  * Leukocytosis. Possible due to hematoma. F/u CBC.  * Thrombocytopenia. Improved after given platelet transfusion and vitamin K. He got 20 mg of DDAVP IV twice per Dr. Isidore Moos. follow-up CBC.  * Hypoxia and tachycardia, due to symptomatic anemia/ESRD.   Was on O2 [place] 5 L.   Some atelactesis on X ray and CT of chest- encouraged incentive spirometry.  Xray reported large pleural effusion, s/p pleural tap now on 04/15/15.   On minimal requirement of oxygen now-  taper to room air if possible.  I discussed with Dr. Candiss Norse and pt's mother and wife present in room.  All the records are reviewed and case discussed with Care Management/Social Workerr. Management plans discussed with the patient,  and mother, and they are in agreement.  CODE STATUS: Full code.  TOTALTIME TAKING CARE OF THIS PATIENT: 30 minutes.   POSSIBLE D/C IN 2-3 DAYS, DEPENDING ON CLINICAL CONDITION.   Vaughan Basta M.D on 04/18/2015 at 9:35 AM  Between 7am to 6pm - Pager - [phone]  After 6pm go to [email or web address] - password [email or web address] [facility]  Office  [phone]  CC: Primary care physician; Pcp Not In System

## 2015-04-18 NOTE — Progress Notes (Signed)
Subjective:    Pain from thoracentesis is better controlled. Using oral tablets PermCath placed on Friday Patient wants to defer bone marrow biopsy for now Kidney biopsy report shows unspecified amyloid. Further testing is pending Vas-Cath was removed from right femoral area. He experienced excessive leading after catheter was removed.  Repeat hemoglobin was stable No acute complaints today Serum creatinine improved to 4.83 spontaneously. Urine output 2250 cc   Objective:  Vital signs in last 24 hours:  Temp:  [97.7 F (36.5 C)-98.4 F (36.9 C)] 97.7 F (36.5 C) (10/23 0533) Pulse Rate:  [73-78] 78 (10/23 0533) Resp:  [16-20] 20 (10/23 0533) BP: (135-155)/(85-93) 141/93 mmHg (10/23 0533) SpO2:  [97 %-98 %] 97 % (10/23 0533) Weight:  [89.767 kg (197 lb 14.4 oz)] 89.767 kg (197 lb 14.4 oz) (10/23 0641)  Weight change: -6.849 kg (-15 lb 1.6 oz) Filed Weights   04/16/15 1024 04/17/15 0500 04/18/15 0641  Weight: 96.616 kg (213 lb) 90.13 kg (198 lb 11.2 oz) 89.767 kg (197 lb 14.4 oz)    Intake/Output: I/O last 3 completed shifts: In: 557.4 [P.O.:480; I.V.:77.4] Out: 4300 [Urine:4300]   Intake/Output this shift:  Total I/O In: 240 [P.O.:240] Out: 950 [Urine:950]  Physical Exam: General: No acute distress  Head: Normocephalic, atraumatic. Moist oral mucosal membranes  Eyes: Anicteric, pallor noted  Neck: Supple, trachea midline  Lungs:  Clear to auscultation normal effort, O2 [place] supplementation  Heart: Regular rate and rhythm no rubs  Abdomen:  Soft, No rebound, BS present  Extremities: no peripheral edema.  Neurologic: Nonfocal, moving all four extremities  Skin:  Some icterus notes  Access:  Rt IJ permcath    Basic Metabolic Panel:  Recent Labs Lab 04/14/15 0412 04/15/15 0440 04/16/15 0419 04/16/15 0420 04/17/15 0432 04/17/15 0442 04/18/15 0546  NA 138 138  138 139 139 138  --  141  K 3.5 3.4*  3.4* 3.4* 3.4* 3.4*  --  3.7  CL 100* 100*  98* 100* 100*  103  --  103  CO2 31 32  32 31 32 29  --  30  GLUCOSE 99 99  100* 103* 103* 109*  --  101*  BUN 43* 54*  53* 59* 60* 59*  --  63*  CREATININE 4.04* 4.87*  4.82* 5.31* 5.22* 5.06*  --  4.83*  CALCIUM 7.4* 7.5*  7.5* 7.5* 7.5* 7.5*  --  7.8*  MG 2.0 2.0 2.0  --   --  1.8 1.8  PHOS 3.1 3.3  --  4.5 3.8  --  4.0    Liver Function Tests:  Recent Labs Lab 04/12/15 0418  04/14/15 0412 04/15/15 0440 04/16/15 0420 04/17/15 0432 04/18/15 0546  AST 32  --   --   --   --   --   --   ALT 119*  --   --   --   --   --   --   ALKPHOS 195*  --   --   --   --   --   --   BILITOT 4.1*  --   --   --   --   --   --   PROT 5.4*  --   --   --   --   --   --   ALBUMIN 2.5*  2.5*  < > 2.5* 2.5* 2.4* 2.3* 2.3*  < > = values in this interval not displayed. No results for input(s): LIPASE, AMYLASE in the last 168 hours.  No results for input(s): AMMONIA in the last 168 hours.  CBC:  Recent Labs Lab 04/15/15 0440 04/15/15 1146 04/16/15 0419 04/17/15 0432 04/18/15 0546  WBC 15.1* 15.3* 14.1* 14.8* 17.3*  HGB 9.2* 9.9* 9.6* 9.8* 10.0*  HCT 27.8* 29.1* 29.2* 28.7* 30.7*  MCV 87.1 87.0 87.3 87.3 86.9  PLT 288 334 370 440 506*    Cardiac Enzymes:  Recent Labs Lab 04/15/15 1146  TROPONINI 0.08*    BNP: Invalid input(s): POCBNP  CBG: No results for input(s): GLUCAP in the last 168 hours.  Microbiology: Results for orders placed or performed during the hospital encounter of 04/06/15  MRSA PCR Screening     Status: None   Collection Time: 04/07/15  9:49 AM  Result Value Ref Range Status   MRSA by PCR NEGATIVE NEGATIVE Final    Comment:        The GeneXpert MRSA Assay (FDA approved for NASAL specimens only), is one component of a comprehensive MRSA colonization surveillance program. It is not intended to diagnose MRSA infection nor to guide or monitor treatment for MRSA infections.   Body fluid culture     Status: None   Collection Time: 04/15/15  9:45 AM  Result Value  Ref Range Status   Specimen Description PLEURAL  Final   Special Requests NONE  Final   Gram Stain FEW WBC SEEN NO ORGANISMS SEEN   Final   Culture NO GROWTH 3 DAYS  Final   Report Status 04/18/2015 FINAL  Final    Coagulation Studies:  Recent Labs  04/15/15 1146  LABPROT 14.1  INR 1.07    Urinalysis: No results for input(s): COLORURINE, LABSPEC, PHURINE, GLUCOSEU, HGBUR, BILIRUBINUR, KETONESUR, PROTEINUR, UROBILINOGEN, NITRITE, LEUKOCYTESUR in the last 72 hours.  Invalid input(s): APPERANCEUR    Imaging: Dg Chest 2 View  04/18/2015  CLINICAL DATA:  Shortness of breath x2 weeks, left pleural effusion, status post renal biopsy on 04/06/2015 EXAM: CHEST  2 VIEW COMPARISON:  04/15/2015 FINDINGS: Cardiomegaly. No frank interstitial edema. Moderate left pleural effusion. No pneumothorax. Right IJ dual lumen catheter terminates in the lower SVC. IMPRESSION: Cardiomegaly.  No frank interstitial edema. Moderate left pleural effusion. Electronically Signed   By: Julian Hy M.D.   On: 04/18/2015 09:59     Medications:     . antiseptic oral rinse  7 mL Mouth Rinse BID  . feeding supplement (NEPRO CARB STEADY)  237 mL Oral BID BM  . Influenza vac split quadrivalent PF  0.5 mL Intramuscular Tomorrow-1000  . pantoprazole  40 mg Oral BID AC  . rosuvastatin  10 mg Oral q1800   acetaminophen, calamine, HYDROmorphone (DILAUDID) injection, ondansetron (ZOFRAN) IV, ondansetron, oxyCODONE-acetaminophen  Assessment/ Plan:  49 y.o. male with past medical history of hypertension, hyperlipidemia, anemia chronic kidney disease, peptic ulcer disease, CKD stage IV, anemia of CKD proteinuria, hyperkalemia s/p renal bioy 04/06/15 with subsequent left perinephric hematoma.  1.  Acute renal failure/CKD stage IV/Proteinuria:   - Pt s/p left renal biopsy. Perinephric hematoma developed which was initially subcapsular, subsequently extracapsular bleeding noted.   - Kidney biopsy shows severe  amyloidosis which is unspecified (not AA or AL). Severe interstitial fibrosis and tubular atrophy - On Friday, case discussed with patient's wife and mother and explained outpatient dialysis process in detail - including options of peritoneal dialysis at home hemodialysis. Transplant will come later, although we discussed the process. -Urine output reported at 2250 cc. serum creatinine improving slowly  Electrolytes and Volume status are acceptable No acute  indication for Dialysis at present  - We will continue to evaluate on a daily basis  2.  Left perinephric hematoma:  Noted post biopsy, was initially subcapsular, now extracapsular.   -s/p embolization of left kidney (done on Sunday)  3.  Anemia of blood loss/Post hemorrhagic anemia:  hgb trending down slowly Iron deficiency- iv iron with HD - Multiple units of blood transfusion given this admission - consider EPO if OK with hematologist  4.  Hyperkalemia:  K currently 3.7 and acceptable.  - encourage regular diet  5. Large left pleural effusion - s/p pleural tap . 1 L of fluid was removed - ? Reactive from intra-abdominal bleed  6. Amyloidosis, unspecified - Patient has deferred bone marrow biopsy for now to be done as outpatient - Treatment plan as per hematology team   LOS: 11 Kendarius Vigen 10/23/201611:28 AM

## 2015-04-19 ENCOUNTER — Telehealth: Payer: Self-pay | Admitting: *Deleted

## 2015-04-19 LAB — RENAL FUNCTION PANEL
ALBUMIN: 2.3 g/dL — AB (ref 3.5–5.0)
ANION GAP: 8 (ref 5–15)
BUN: 61 mg/dL — AB (ref 6–20)
CALCIUM: 7.8 mg/dL — AB (ref 8.9–10.3)
CO2: 27 mmol/L (ref 22–32)
Chloride: 103 mmol/L (ref 101–111)
Creatinine, Ser: 4.93 mg/dL — ABNORMAL HIGH (ref 0.61–1.24)
GFR calc Af Amer: 14 mL/min — ABNORMAL LOW (ref >60)
GFR, EST NON AFRICAN AMERICAN: 12 mL/min — AB (ref >60)
GLUCOSE: 91 mg/dL (ref 65–99)
PHOSPHORUS: 4.4 mg/dL (ref 2.5–4.6)
Potassium: 4 mmol/L (ref 3.5–5.1)
SODIUM: 138 mmol/L (ref 135–145)

## 2015-04-19 LAB — MAGNESIUM: Magnesium: 1.6 mg/dL — ABNORMAL LOW (ref 1.7–2.4)

## 2015-04-19 LAB — CYTOLOGY - NON PAP

## 2015-04-19 NOTE — Evaluation (Signed)
Physical Therapy Evaluation Patient Details Name: Joseph [name] MRN: [id number] DOB: December 11, 1965 Today's Date: 04/19/2015   History of Present Illness  Pt is a 49yo white male who has been at [facility] for about 2 weeks with multiple medical issues, inlcuding Acute kidney failure, and most recently presenting after thoracocentesis. Pt is mor emediaclly stable at this time and PT is asked to evaluate patient to determine any need for skilled services s/p D/C.   Clinical Impression  Pt received up in chair with wife in room upon entry, talking to mother on telephone. Pt is pleasant and a motivated to participate, excited about leaving hospital soon. Pt report of pain remains consistent but is managed well. All mobility performed with modified independence or better. Pt strength is [facility] for all mobility and limited community distance ambulation. Pt balance scored on Berg Balance Test is 50/56, indicative of only a mild falls risk increase. Pt presenting without significant impairment at this time, and is agreeable that no skilled services are appropriate. Encouraged patient to gradually increase activity after D/C and continue to progress mobility while admitted, with the clearance of RN and assistance of family. No further PT needed at this time.      Follow Up Recommendations No PT follow up    Equipment Recommendations  None recommended by PT    Recommendations for Other Services       Precautions / Restrictions Precautions Precautions: None Restrictions Weight Bearing Restrictions: No      Mobility  Bed Mobility               General bed mobility comments: Received up in chair.   Transfers Overall transfer level: Independent                  Ambulation/Gait Ambulation/Gait assistance: Supervision Ambulation Distance (Feet): 240 Feet Assistive device: None Gait Pattern/deviations: WFL(Within Functional Limits)        Stairs Stairs: Yes Stairs assistance: Min  guard   Number of Stairs: 6 General stair comments: single UE support, with step-over-step gait.   Wheelchair Mobility    Modified Rankin (Stroke Patients Only)       Balance                                 Standardized Balance Assessment Standardized Balance Assessment : Berg Balance Test Berg Balance Test Sit to Stand: Able to stand without using hands and stabilize independently Standing Unsupported: Able to stand safely 2 minutes Sitting with Back Unsupported but Feet Supported on Floor or Stool: Able to sit safely and securely 2 minutes Stand to Sit: Sits safely with minimal use of hands Transfers: Able to transfer safely, minor use of hands Standing Unsupported with Eyes Closed: Able to stand 10 seconds safely Standing Ubsupported with Feet Together: Able to place feet together independently and stand 1 minute safely From Standing, Reach Forward with Outstretched Arm: Can reach confidently >25 cm (10") From Standing Position, Pick up Object from Floor: Able to pick up shoe safely and easily From Standing Position, Turn to Look Behind Over each Shoulder: Looks behind one side only/other side shows less weight shift Turn 360 Degrees: Able to turn 360 degrees safely in 4 seconds or less Standing Unsupported, Alternately Place Feet on Step/Stool: Able to stand independently and safely and complete 8 steps in 20 seconds Standing Unsupported, One Foot in Front: Able to take small step independently and hold 30 seconds  Standing on One Leg: Tries to lift leg/unable to hold 3 seconds but remains standing independently Total Score: 50         Pertinent Vitals/Pain Pain Assessment: 0-10 Pain Score: 5  Pain Location: 5/10 at L subscapular hematoma, and 3/10 at left flank.  Pain Descriptors / Indicators: Sharp Pain Intervention(s): Limited activity within patient's tolerance;Monitored during session;Premedicated before session    Home Living Family/patient expects  to be discharged to:: Private residence Living Arrangements: Spouse/significant other Available Help at Discharge: Family Type of Home: House Home Access: Stairs to enter Entrance Stairs-Rails: Can reach both;Right;Left Entrance Stairs-Number of Steps: 4 Home Layout: Two level;Able to live on main level with bedroom/bathroom        Prior Function Level of Independence: Independent               Hand Dominance   Dominant Hand: Right    Extremity/Trunk Assessment   Upper Extremity Assessment: Overall WFL for tasks assessed           Lower Extremity Assessment: Generalized weakness;Overall WFL for tasks assessed         Communication   Communication: No difficulties  Cognition Arousal/Alertness: Awake/alert Behavior During Therapy: WFL for tasks assessed/performed                        General Comments      Exercises        Assessment/Plan    PT Assessment Patent does not need any further PT services  PT Diagnosis Generalized weakness   PT Problem List    PT Treatment Interventions     PT Goals (Current goals can be found in the Care Plan section) Acute Rehab PT Goals PT Goal Formulation: All assessment and education complete, DC therapy    Frequency     Barriers to discharge        Co-evaluation               End of Session Equipment Utilized During Treatment: Gait belt Activity Tolerance: Patient tolerated treatment well;No increased pain Patient left: in chair;with family/visitor present Nurse Communication: Mobility status;Other (comment)         [id number] PT Time Calculation (min) (ACUTE ONLY): 16 min   Charges:   PT Evaluation $Initial PT Evaluation Tier I: 1 Procedure     PT G Codes:        Buccola,Allan C 2015-04-25, 12:15 PM  12:19 PM  Etta Grandchild, PT, DPT [place] License # [id number]

## 2015-04-19 NOTE — Progress Notes (Signed)
Subjective:   overall it appears that renal function has stabilized. Creatinine currently 4.9. Hemoglobin also relatively stable at 10.0. Patient currently in good spirits. Good urine output of 2.8 L noted.  Objective:  Vital signs in last 24 hours:  Temp:  [98 F (36.7 C)-98.8 F (37.1 C)] 98 F (36.7 C) (10/24 0447) Pulse Rate:  [75-78] 77 (10/24 0447) Resp:  [16-18] 16 (10/23 2014) BP: (134-160)/(89-93) 144/91 mmHg (10/24 0447) SpO2:  [94 %-97 %] 95 % (10/24 0855) Weight:  [87.181 kg (192 lb 3.2 oz)] 87.181 kg (192 lb 3.2 oz) (10/24 9326)  Weight change: -2.586 kg (-5 lb 11.2 oz) Filed Weights   04/17/15 0500 04/18/15 0641 04/19/15 0620  Weight: 90.13 kg (198 lb 11.2 oz) 89.767 kg (197 lb 14.4 oz) 87.181 kg (192 lb 3.2 oz)    Intake/Output: I/O last 3 completed shifts: In: 240 [P.O.:240] Out: 2850 [Urine:2850]   Intake/Output this shift:  Total I/O In: 240 [P.O.:240] Out: -   Physical Exam: General: No acute distress  Head: Normocephalic, atraumatic. Moist oral mucosal membranes  Eyes: Anicteric, pallor noted  Neck: Supple, trachea midline  Lungs:  Clear to auscultation normal effort, O2 [place] supplementation  Heart: Regular rate and rhythm no rubs  Abdomen:  Soft, No rebound, BS present  Extremities: no peripheral edema.  Neurologic: Nonfocal, moving all four extremities  Skin:  Some icterus notes  Access:  Rt IJ permcath    Basic Metabolic Panel:  Recent Labs Lab 04/15/15 0440 04/16/15 0419 04/16/15 0420 04/17/15 0432 04/17/15 0442 04/18/15 0546 04/19/15 0715  NA 138  138 139 139 138  --  141 138  K 3.4*  3.4* 3.4* 3.4* 3.4*  --  3.7 4.0  CL 100*  98* 100* 100* 103  --  103 103  CO2 32  32 31 32 29  --  30 27  GLUCOSE 99  100* 103* 103* 109*  --  101* 91  BUN 54*  53* 59* 60* 59*  --  63* 61*  CREATININE 4.87*  4.82* 5.31* 5.22* 5.06*  --  4.83* 4.93*  CALCIUM 7.5*  7.5* 7.5* 7.5* 7.5*  --  7.8* 7.8*  MG 2.0 2.0  --   --  1.8 1.8 1.6*   PHOS 3.3  --  4.5 3.8  --  4.0 4.4    Liver Function Tests:  Recent Labs Lab 04/15/15 0440 04/16/15 0420 04/17/15 0432 04/18/15 0546 04/19/15 0715  ALBUMIN 2.5* 2.4* 2.3* 2.3* 2.3*   No results for input(s): LIPASE, AMYLASE in the last 168 hours. No results for input(s): AMMONIA in the last 168 hours.  CBC:  Recent Labs Lab 04/15/15 0440 04/15/15 1146 04/16/15 0419 04/17/15 0432 04/18/15 0546  WBC 15.1* 15.3* 14.1* 14.8* 17.3*  HGB 9.2* 9.9* 9.6* 9.8* 10.0*  HCT 27.8* 29.1* 29.2* 28.7* 30.7*  MCV 87.1 87.0 87.3 87.3 86.9  PLT 288 334 370 440 506*    Cardiac Enzymes:  Recent Labs Lab 04/15/15 1146  TROPONINI 0.08*    BNP: Invalid input(s): POCBNP  CBG: No results for input(s): GLUCAP in the last 168 hours.  Microbiology: Results for orders placed or performed during the hospital encounter of 04/06/15  MRSA PCR Screening     Status: None   Collection Time: 04/07/15  9:49 AM  Result Value Ref Range Status   MRSA by PCR NEGATIVE NEGATIVE Final    Comment:        The GeneXpert MRSA Assay (FDA approved for NASAL specimens  only), is one component of a comprehensive MRSA colonization surveillance program. It is not intended to diagnose MRSA infection nor to guide or monitor treatment for MRSA infections.   Body fluid culture     Status: None   Collection Time: 04/15/15  9:45 AM  Result Value Ref Range Status   Specimen Description PLEURAL  Final   Special Requests NONE  Final   Gram Stain FEW WBC SEEN NO ORGANISMS SEEN   Final   Culture NO GROWTH 3 DAYS  Final   Report Status 04/18/2015 FINAL  Final    Coagulation Studies: No results for input(s): LABPROT, INR in the last 72 hours.  Urinalysis: No results for input(s): COLORURINE, LABSPEC, PHURINE, GLUCOSEU, HGBUR, BILIRUBINUR, KETONESUR, PROTEINUR, UROBILINOGEN, NITRITE, LEUKOCYTESUR in the last 72 hours.  Invalid input(s): APPERANCEUR    Imaging: Dg Chest 2 View  04/18/2015  CLINICAL  DATA:  Shortness of breath x2 weeks, left pleural effusion, status post renal biopsy on 04/06/2015 EXAM: CHEST  2 VIEW COMPARISON:  04/15/2015 FINDINGS: Cardiomegaly. No frank interstitial edema. Moderate left pleural effusion. No pneumothorax. Right IJ dual lumen catheter terminates in the lower SVC. IMPRESSION: Cardiomegaly.  No frank interstitial edema. Moderate left pleural effusion. Electronically Signed   By: Julian Hy M.D.   On: 04/18/2015 09:59     Medications:     . antiseptic oral rinse  7 mL Mouth Rinse BID  . feeding supplement (NEPRO CARB STEADY)  237 mL Oral BID BM  . Influenza vac split quadrivalent PF  0.5 mL Intramuscular Tomorrow-1000  . pantoprazole  40 mg Oral BID AC  . rosuvastatin  10 mg Oral q1800   acetaminophen, calamine, HYDROmorphone (DILAUDID) injection, ondansetron (ZOFRAN) IV, ondansetron, oxyCODONE-acetaminophen  Assessment/ Plan:  49 y.o. male with past medical history of hypertension, hyperlipidemia, anemia chronic kidney disease, peptic ulcer disease, CKD stage IV, anemia of CKD proteinuria, hyperkalemia s/p renal bioy 04/06/15 with subsequent left perinephric hematoma.  1.  Acute renal failure/CKD stage IV/Proteinuria:   - Pt s/p left renal biopsy. Perinephric hematoma developed which was initially subcapsular, subsequently extracapsular bleeding noted.   Kidney biopsy shows severe amyloidosis which is unspecified (not AA or AL). Severe interstitial fibrosis and tubular atrophy - the patient still has severe renal dysfunction however her urine output was noted as being 2.8 L over the past 3 shifts.  Therefore it does not appear that the patient requires ongoing dialysis for now. He may end up requiring dialysis however within the nextseveral weeks to months. For now we will leave his PermCath in place.  The patient is likely to progress to end-stage renal disease however.  Continue to monitor renal function daily for now.  2.  Left perinephric  hematoma:  Noted post biopsy, was initially subcapsular, now extracapsular.   -s/p embolization of left kidney (done on 04/11/15.)  3.  Anemia of blood loss/Post hemorrhagic anemia:  hgb trending down slowly Iron deficiency- iv iron with HD - Hemoglobin appears to have stabilized at 10.0 for now.  Continue to monitor.  4.  Hyperkalemia:  esolved at present as potassium is 4.0.t  5. Large left pleural effusion - s/p pleural tap . 1 L of fluid was removed - ? Reactive from intra-abdominal bleed  6. Amyloidosis, unspecified - Patient has deferred bone marrow biopsy for now to be done as outpatient - Treatment plan as per hematology team   LOS: [place], Jojo Geving 10/24/201612:07 PM

## 2015-04-19 NOTE — Progress Notes (Signed)
[place] [facility] [place] NAME: Joseph [name]    MR#:  [id number]  DATE OF BIRTH:  [birth year]  SUBJECTIVE:  CHIEF COMPLAINT:  No chief complaint on file. perinephric hematoma- s/p embolization , large pleural effusion- s/p tap- permacath placed.  pain under control. Pt want to defer bone marrow biopsy for now. On room air now. Sitting in chair.   REVIEW OF SYSTEMS:  CONSTITUTIONAL: No fever, generalized weakness.  EYES: No blurred or double vision.  EARS, NOSE, AND THROAT: No tinnitus or ear pain.  RESPIRATORY: No cough,positive for shortness of breath, no wheezing or hemoptysis.  CARDIOVASCULAR: have mild chest pain- after procedure , no orthopnea, edema.  GASTROINTESTINAL: has nausea, no vomiting, diarrhea, but has abdominal pain.  GENITOURINARY: No dysuria, hematuria.  ENDOCRINE: No polyuria, nocturia,  HEMATOLOGY: No anemia, easy bruising or bleeding SKIN: No rash or lesion. MUSCULOSKELETAL: Worsening back pain.   NEUROLOGIC: No tingling, numbness, weakness.  PSYCHIATRY: No anxiety or depression.   DRUG ALLERGIES:   Allergies  Allergen Reactions  . Ciprofloxacin Hcl   . Doxycycline Hyclate     VITALS:  Blood pressure 146/86, pulse 81, temperature 98.2 F (36.8 C), temperature source Oral, resp. rate 17, height 5' 11"  (1.803 m), weight 87.181 kg (192 lb 3.2 oz), SpO2 95 %.  PHYSICAL EXAMINATION:  GENERAL:  49 y.o.-year-old patient lying in the bed with no acute distress.  EYES: Pupils equal, round, reactive to light and accommodation. No scleral icterus. Extraocular muscles intact.  HEENT: Head atraumatic, normocephalic. Oropharynx and nasopharynx clear. Moist oral mucosa. NECK:  Supple, no jugular venous distention. No thyroid enlargement, no tenderness.  LUNGS: equal breath sound both side, no wheezing, rales,rhonchi or crepitation. No use of accessory muscles of respiration. permacath on right chest. CARDIOVASCULAR: S1, S2  normal.tachycardia with pain, No murmurs, rubs, or gallops.  ABDOMEN: Soft, nontender, nondistended. Bowel sounds present. No organomegaly or mass.  EXTREMITIES: No pedal edema, cyanosis, or clubbing.  NEUROLOGIC: Cranial nerves II through XII are intact. Muscle strength 4/5 in all extremities. Sensation intact. Gait not checked.  PSYCHIATRIC: The patient is alert and oriented x 3.  SKIN: No obvious rash, lesion, or ulcer.    LABORATORY PANEL:   CBC  Recent Labs Lab 04/18/15 0546  WBC 17.3*  HGB 10.0*  HCT 30.7*  PLT 506*   ------------------------------------------------------------------------------------------------------------------  Chemistries   Recent Labs Lab 04/19/15 0715  NA 138  K 4.0  CL 103  CO2 27  GLUCOSE 91  BUN 61*  CREATININE 4.93*  CALCIUM 7.8*  MG 1.6*   ------------------------------------------------------------------------------------------------------------------  Cardiac Enzymes  Recent Labs Lab 04/15/15 1146  TROPONINI 0.08*   ------------------------------------------------------------------------------------------------------------------  RADIOLOGY:  Dg Chest 2 View  04/18/2015  CLINICAL DATA:  Shortness of breath x2 weeks, left pleural effusion, status post renal biopsy on 04/06/2015 EXAM: CHEST  2 VIEW COMPARISON:  04/15/2015 FINDINGS: Cardiomegaly. No frank interstitial edema. Moderate left pleural effusion. No pneumothorax. Right IJ dual lumen catheter terminates in the lower SVC. IMPRESSION: Cardiomegaly.  No frank interstitial edema. Moderate left pleural effusion. Electronically Signed   By: Julian Hy M.D.   On: 04/18/2015 09:59     ASSESSMENT AND PLAN:   * Acute renal failure/CKD stage IV progressed to ESRD. Due to Amyloid deposits- as per primary biopsy report. He got CRRT for 3 days and need HD. Per Dr. Holley Raring ,  on HD for now. Continue to monitor electrolytes and    UOP closely.Possible AA  amyloid noted on  prelim renal biopsy per Dr. Candiss Norse.  permacath placed 04/16/15  further diagnostic by BM biopsy- pt want to defer for now.  nephrology to arrange for out pt HD.  for last 3-4 days , pt's electrolyte are stable- and not needing HD.  Dr. Holley Raring would like to monitor for 1-2 more days his labs to check for requirement of HD, then decide discharge plan.  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    Bone marrow biopsy suggested by Dr. Grayland Ormond for staging , pt does not want it now- want to feel little better first.     Informed hematologist- that pt would like to know more about staging, treatment options and out come of this disease.  *  left scapsular hematoma after renal biopsy.   Perinephric hematoma developed which was initially subcapsular, later with extracapsular bleeding.  Per CT abd worsening of hematoma, along with possible adrenal gland hemorrhage.   embolization of left kidney by vascular on 04/11/15. Since then Hb stable.  * pleural effusion    S/p US guided tap- 04/15/15- caused 1 ltr bloody fluid removal.   Likely secondary to his perinephric hematoma.    Followed by severe pain, vitals stable.    No pneumothorax.    stable CBC and INR.    Slightly high troponin is expected in renal failure    pulmonologist for follow up.    Dilaudid inj for pain as needed for now.    Pain is much better controlled     Oxygen requirement is coming down.     Will taper oxygen to room air, and control pain only with oral meds as a preparation of possible discharge     early next week.  * Hyperkalemia. Improved.  *  Hpotension: improved.  *Acute blood loss anemia due to 1. Hb increased from 6.6 to 7.5 after 2 unit PRBC transfusion.    Hb dropped again- 2 more units PRBC transfusion on 04/13/15.   Follow closely. Stable for now.  * Hypomagnesemia. Mag supplement. Improved.  * Leukocytosis. Possible due to hematoma. F/u CBC.  *  Thrombocytopenia. Improved after given platelet transfusion and vitamin K. He got 20 mg of DDAVP IV twice per Dr. Isidore Moos. follow-up CBC.  * Hypoxia and tachycardia, due to symptomatic anemia/ESRD.   Was on O2 [place] 5 L.   Some atelactesis on X ray and CT of chest- encouraged incentive spirometry.  Xray reported large pleural effusion, s/p pleural tap now on 04/15/15.   On room air now, able to walk without any trouble.  I discussed with Dr. Candiss Norse and pt's mother and wife present in room.  All the records are reviewed and case discussed with Care Management/Social Workerr. Management plans discussed with the patient,  and mother, and they are in agreement.  CODE STATUS: Full code.  TOTALTIME TAKING CARE OF THIS PATIENT: 30 minutes.   POSSIBLE D/C IN 2-3 DAYS, DEPENDING ON CLINICAL CONDITION.   Vaughan Basta M.D on 04/19/2015 at 3:33 PM  Between 7am to 6pm - Pager - [phone]  After 6pm go to [email or web address] - password [email or web address] [facility]  Office  [phone]  CC: Primary care physician; Pcp Not In System

## 2015-04-19 NOTE — Care Management Note (Signed)
Case Management Note  Patient Details  Name: Cajun Bayers MRN: [id number] Date of Birth: 1966-03-18  Subjective/Objective:  Chart reviewed. Case discussed with Dr. Marthann Schiller to review motivations for continued stay. He will clarify goals of care with Nephrology and advise further.                  Action/Plan:   Expected Discharge Date:  04/19/2015               Expected Discharge Plan:     In-House Referral:     Discharge planning Services  CM Consult  Post Acute Care Choice:    Choice offered to:     DME Arranged:    DME Agency:     HH Arranged:    HH Agency:     Status of Service:  In process, will continue to follow  Medicare Important Message Given:    Date Medicare IM Given:    Medicare IM give by:    Date Additional Medicare IM Given:    Additional Medicare Important Message give by:     If discussed at [place] of Stay Meetings, dates discussed:    Additional Comments:  Jolly Mango, RN 04/19/2015, 1:24 PM

## 2015-04-19 NOTE — Telephone Encounter (Signed)
-----   Message from Vilinda Boehringer, MD sent at 04/18/2015 11:49 AM EDT ----- Please schedule hospital follow up with Dr. Juanell Fairly in 3-4 weeks.  Thanks Dr. Stevenson Clinch

## 2015-04-19 NOTE — Telephone Encounter (Signed)
Pt scheduled for 05/18/15 @ 11am with Dr. Ashby Dawes. Informed unit for room 201 since pt is still in hosp. Nothing further needed.

## 2015-04-20 ENCOUNTER — Encounter: Payer: Self-pay | Admitting: Vascular Surgery

## 2015-04-20 LAB — RENAL FUNCTION PANEL
ALBUMIN: 2.4 g/dL — AB (ref 3.5–5.0)
Anion gap: 9 (ref 5–15)
BUN: 60 mg/dL — ABNORMAL HIGH (ref 6–20)
CHLORIDE: 103 mmol/L (ref 101–111)
CO2: 26 mmol/L (ref 22–32)
CREATININE: 5 mg/dL — AB (ref 0.61–1.24)
Calcium: 8.1 mg/dL — ABNORMAL LOW (ref 8.9–10.3)
GFR, EST AFRICAN AMERICAN: 14 mL/min — AB (ref >60)
GFR, EST NON AFRICAN AMERICAN: 12 mL/min — AB (ref >60)
Glucose, Bld: 104 mg/dL — ABNORMAL HIGH (ref 65–99)
PHOSPHORUS: 4.2 mg/dL (ref 2.5–4.6)
POTASSIUM: 4.4 mmol/L (ref 3.5–5.1)
Sodium: 138 mmol/L (ref 135–145)

## 2015-04-20 LAB — MAGNESIUM: MAGNESIUM: 1.8 mg/dL (ref 1.7–2.4)

## 2015-04-20 MED ORDER — SENNOSIDES-DOCUSATE SODIUM 8.6-50 MG PO TABS
1.0000 | ORAL_TABLET | Freq: Two times a day (BID) | ORAL | Status: DC
  Administered 2015-04-20 (×2): 1 via ORAL
  Filled 2015-04-20 (×2): qty 1

## 2015-04-20 NOTE — Progress Notes (Signed)
[place] at [place] NAME: Joseph [name]    MR#:  [id number]  DATE OF BIRTH:  01-08-1966  SUBJECTIVE:  CHIEF COMPLAINT:  No chief complaint on file. perinephric hematoma- s/p embolization , large pleural effusion- s/p tap- permacath placed.  pain under control. Pt want to defer bone marrow biopsy for now. On room air now. Sitting in chair.   REVIEW OF SYSTEMS:  CONSTITUTIONAL: No fever, generalized weakness.  EYES: No blurred or double vision.  EARS, NOSE, AND THROAT: No tinnitus or ear pain.  RESPIRATORY: No cough,no shortness of breath, no wheezing or hemoptysis.  CARDIOVASCULAR: have mild chest pain- after procedure , no orthopnea, edema.  GASTROINTESTINAL: has nausea, no vomiting, diarrhea, but has abdominal pain.  GENITOURINARY: No dysuria, hematuria.  ENDOCRINE: No polyuria, nocturia,  HEMATOLOGY: No anemia, easy bruising or bleeding SKIN: No rash or lesion. MUSCULOSKELETAL: Worsening back pain.   NEUROLOGIC: No tingling, numbness, weakness.  PSYCHIATRY: No anxiety or depression.   DRUG ALLERGIES:   Allergies  Allergen Reactions  . Ciprofloxacin Hcl   . Doxycycline Hyclate     VITALS:  Blood pressure 143/97, pulse 68, temperature 97.7 F (36.5 C), temperature source Oral, resp. rate 16, height 5' 11"  (1.803 m), weight 90.583 kg (199 lb 11.2 oz), SpO2 96 %.  PHYSICAL EXAMINATION:  GENERAL:  49 y.o.-year-old patient lying in the bed with no acute distress.  EYES: Pupils equal, round, reactive to light and accommodation. No scleral icterus. Extraocular muscles intact.  HEENT: Head atraumatic, normocephalic. Oropharynx and nasopharynx clear. Moist oral mucosa. NECK:  Supple, no jugular venous distention. No thyroid enlargement, no tenderness.  LUNGS: equal breath sound both side, no wheezing, rales,rhonchi or crepitation. No use of accessory muscles of respiration. permacath on right chest. CARDIOVASCULAR: S1, S2  normal.tachycardia with pain, No murmurs, rubs, or gallops.  ABDOMEN: Soft, nontender, nondistended. Bowel sounds present. No organomegaly or mass.  EXTREMITIES: No pedal edema, cyanosis, or clubbing.  NEUROLOGIC: Cranial nerves II through XII are intact. Muscle strength 4/5 in all extremities. Sensation intact. Gait not checked.  PSYCHIATRIC: The patient is alert and oriented x 3.  SKIN: No obvious rash, lesion, or ulcer.   LABORATORY PANEL:   CBC  Recent Labs Lab 04/18/15 0546  WBC 17.3*  HGB 10.0*  HCT 30.7*  PLT 506*   ------------------------------------------------------------------------------------------------------------------  Chemistries   Recent Labs Lab 04/20/15 0608  NA 138  K 4.4  CL 103  CO2 26  GLUCOSE 104*  BUN 60*  CREATININE 5.00*  CALCIUM 8.1*  MG 1.8   ------------------------------------------------------------------------------------------------------------------  Cardiac Enzymes  Recent Labs Lab 04/15/15 1146  TROPONINI 0.08*   ------------------------------------------------------------------------------------------------------------------  RADIOLOGY:  No results found.   ASSESSMENT AND PLAN:   * Acute renal failure/CKD stage IV progressed to ESRD. Due to Amyloid deposits- as per primary biopsy report. He got CRRT for 3 days and need HD. Per Dr. Holley Raring ,  on HD for now. Continue to monitor electrolytes and    UOP closely.Possible AA amyloid noted on prelim renal biopsy per Dr. Candiss Norse.  permacath placed 04/16/15  further diagnostic by BM biopsy- pt want to defer for now.  nephrology to arrange for out pt HD.  for last 3-4 days , pt's electrolyte are stable- and not needing HD.  Dr. Holley Raring would like to monitor for 1-2 more days his labs to check for requirement of HD, then decide discharge plan.   Likely d/c tomorrow- if remains stable.  *  Amyloidosis   Likely diagnosis from renal biopsy, Appreciated help by hematology.     Bone marrow biopsy suggested by Dr. Grayland Ormond for staging , pt does not want it now- want to feel little better first.     Informed hematologist- that pt would like to know more about staging, treatment options and out come of this disease.  *  left perinephic hematoma after renal biopsy.   Perinephric hematoma developed which was initially subcapsular, later with extracapsular bleeding.  Per CT abd worsening of hematoma, along with possible adrenal gland hemorrhage.   embolization of left kidney by vascular on 04/11/15. Since then Hb stable.  * pleural effusion    S/p US guided tap- 04/15/15- caused 1 ltr bloody fluid removal.    Likely secondary to his perinephric hematoma.    Followed by severe pain, vitals stable.    No pneumothorax.    stable CBC and INR.    Slightly high troponin is expected in renal failure    pulmonologist for follow up.    Dilaudid inj for pain as needed for now.    Pain is much better controlled     Oxygen requirement is coming down.     taper oxygen to room air, and control pain only with oral meds as a preparation of possible discharge       Now on room air.  * Hyperkalemia. Improved.  *  Hpotension: improved.  *Acute blood loss anemia due to 1. Hb increased from 6.6 to 7.5 after 2 unit PRBC transfusion.    Hb dropped again- 2 more units PRBC transfusion on 04/13/15.   Follow closely. Stable for now.  * Hypomagnesemia. Mag supplement. Improved.  * Leukocytosis. Possible due to hematoma. F/u CBC.  * Thrombocytopenia. Improved after given platelet transfusion and vitamin K. He got 20 mg of DDAVP IV twice per Dr. Isidore Moos. follow-up CBC.  * Hypoxia and tachycardia, due to symptomatic anemia/ESRD.   Was on O2 [place] 5 L.   Some atelactesis on X ray and CT of chest- encouraged incentive spirometry.  Xray reported large pleural effusion, s/p pleural tap now on 04/15/15.   On room air now, able to walk without any trouble.  I discussed with Dr. Holley Raring  and pt's mother and wife present in room.  All the records are reviewed and case discussed with Care Management/Social Workerr. Management plans discussed with the patient,  and mother, and they are in agreement.  CODE STATUS: Full code.  TOTALTIME TAKING CARE OF THIS PATIENT: 30 minutes.   POSSIBLE D/C IN 2-3 DAYS, DEPENDING ON CLINICAL CONDITION.   Vaughan Basta M.D on 04/20/2015 at 12:57 PM  Between 7am to 6pm - Pager - [phone]  After 6pm go to [email or web address] - password [email or web address] [facility]  Office  [phone]  CC: Primary care physician; Pcp Not In System

## 2015-04-20 NOTE — Care Management Note (Signed)
Case Management Note  Patient Details  Name: Joseph [name] MRN: [id number] Date of Birth: 1966/02/02  Subjective/Objective:   Nephrology watching renal function and need for future dialysis. Patient has been up walking in the halls. It is anticipated that patient will be discharged tomorrow. No acute need identified                 Action/Plan: Home with self care  Expected Discharge Date:  04/21/2015               Expected Discharge Plan:     In-House Referral:     Discharge planning Services  CM Consult  Post Acute Care Choice:    Choice offered to:     DME Arranged:    DME Agency:     HH Arranged:    HH Agency:     Status of Service:  In process, will continue to follow  Medicare Important Message Given:    Date Medicare IM Given:    Medicare IM give by:    Date Additional Medicare IM Given:    Additional Medicare Important Message give by:     If discussed at [place] of Stay Meetings, dates discussed:    Additional Comments:  Jolly Mango, RN 04/20/2015, 1:10 PM

## 2015-04-20 NOTE — Progress Notes (Signed)
Subjective:   Cr slightly up to 5.0. Good UOP noted. Patient ambulating the halls efficiently. Pain control is good. Last hgb was 10.0.   Objective:  Vital signs in last 24 hours:  Temp:  [97.7 F (36.5 C)-98.2 F (36.8 C)] 97.7 F (36.5 C) (10/25 0416) Pulse Rate:  [68-107] 68 (10/25 0416) Resp:  [16-17] 16 (10/25 0416) BP: (140-146)/(86-97) 143/97 mmHg (10/25 0416) SpO2:  [95 %-97 %] 96 % (10/25 0416) Weight:  [90.583 kg (199 lb 11.2 oz)] 90.583 kg (199 lb 11.2 oz) (10/25 0452)  Weight change: 3.402 kg (7 lb 8 oz) Filed Weights   04/18/15 0641 04/19/15 0620 04/20/15 0452  Weight: 89.767 kg (197 lb 14.4 oz) 87.181 kg (192 lb 3.2 oz) 90.583 kg (199 lb 11.2 oz)    Intake/Output: I/O last 3 completed shifts: In: 240 [P.O.:240] Out: 2575 [Urine:2575]   Intake/Output this shift:  Total I/O In: 240 [P.O.:240] Out: 550 [Urine:550]  Physical Exam: General: No acute distress  Head: Normocephalic, atraumatic. Moist oral mucosal membranes  Eyes: Anicteric, pallor noted  Neck: Supple, trachea midline  Lungs:  Clear to auscultation normal effort  Heart: Regular rate and rhythm no rubs  Abdomen:  Soft, No rebound, BS present  Extremities: no peripheral edema.  Neurologic: Nonfocal, moving all four extremities  Skin: No rashes  Access:  Rt IJ permcath    Basic Metabolic Panel:  Recent Labs Lab 04/16/15 0419 04/16/15 0420 04/17/15 0432 04/17/15 0442 04/18/15 0546 04/19/15 0715 04/20/15 0608  NA 139 139 138  --  141 138 138  K 3.4* 3.4* 3.4*  --  3.7 4.0 4.4  CL 100* 100* 103  --  103 103 103  CO2 31 32 29  --  _0 GLUCOSE 103* 103* 109*  --  101* 91 104*  BUN 59* 60* 59*  --  63* 61* 60*  CREATININE 5.31* 5.22* 5.06*  --  4.83* 4.93* 5.00*  CALCIUM 7.5* 7.5* 7.5*  --  7.8* 7.8* 8.1*  MG 2.0  --   --  1.8 1.8 1.6* 1.8  PHOS  --  4.5 3.8  --  4.0 4.4 4.2    Liver Function Tests:  Recent Labs Lab 04/16/15 0420 04/17/15 0432 04/18/15 0546  04/19/15 0715 04/20/15 0608  ALBUMIN 2.4* 2.3* 2.3* 2.3* 2.4*   No results for input(s): LIPASE, AMYLASE in the last 168 hours. No results for input(s): AMMONIA in the last 168 hours.  CBC:  Recent Labs Lab 04/15/15 0440 04/15/15 1146 04/16/15 0419 04/17/15 0432 04/18/15 0546  WBC 15.1* 15.3* 14.1* 14.8* 17.3*  HGB 9.2* 9.9* 9.6* 9.8* 10.0*  HCT 27.8* 29.1* 29.2* 28.7* 30.7*  MCV 87.1 87.0 87.3 87.3 86.9  PLT 288 334 370 440 506*    Cardiac Enzymes:  Recent Labs Lab 04/15/15 1146  TROPONINI 0.08*    BNP: Invalid input(s): POCBNP  CBG: No results for input(s): GLUCAP in the last 168 hours.  Microbiology: Results for orders placed or performed during the hospital encounter of 04/06/15  MRSA PCR Screening     Status: None   Collection Time: 04/07/15  9:49 AM  Result Value Ref Range Status   MRSA by PCR NEGATIVE NEGATIVE Final    Comment:        The GeneXpert MRSA Assay (FDA approved for NASAL specimens only), is one component of a comprehensive MRSA colonization surveillance program. It is not intended to diagnose MRSA infection nor to guide or monitor treatment for MRSA infections.  Body fluid culture     Status: None   Collection Time: 04/15/15  9:45 AM  Result Value Ref Range Status   Specimen Description PLEURAL  Final   Special Requests NONE  Final   Gram Stain FEW WBC SEEN NO ORGANISMS SEEN   Final   Culture NO GROWTH 3 DAYS  Final   Report Status 04/18/2015 FINAL  Final    Coagulation Studies: No results for input(s): LABPROT, INR in the last 72 hours.  Urinalysis: No results for input(s): COLORURINE, LABSPEC, PHURINE, GLUCOSEU, HGBUR, BILIRUBINUR, KETONESUR, PROTEINUR, UROBILINOGEN, NITRITE, LEUKOCYTESUR in the last 72 hours.  Invalid input(s): APPERANCEUR    Imaging: No results found.   Medications:     . antiseptic oral rinse  7 mL Mouth Rinse BID  . feeding supplement (NEPRO CARB STEADY)  237 mL Oral BID BM  . Influenza vac  split quadrivalent PF  0.5 mL Intramuscular Tomorrow-1000  . pantoprazole  40 mg Oral BID AC  . rosuvastatin  10 mg Oral q1800   acetaminophen, calamine, HYDROmorphone (DILAUDID) injection, ondansetron (ZOFRAN) IV, ondansetron, oxyCODONE-acetaminophen  Assessment/ Plan:  49 y.o. male with past medical history of hypertension, hyperlipidemia, anemia chronic kidney disease, peptic ulcer disease, CKD stage IV, anemia of CKD proteinuria, hyperkalemia s/p renal bioy 04/06/15 with subsequent left perinephric hematoma.  1.  Acute renal failure/CKD stage IV/Proteinuria:   - Pt s/p left renal biopsy. Perinephric hematoma developed which was initially subcapsular, subsequently extracapsular bleeding noted.   Kidney biopsy shows severe amyloidosis which is unspecified (not AA or AL). Severe interstitial fibrosis and tubular atrophy - Cr about the same at 5, good UOP noted.  No urgent indication to restart dialysis at this time.  However suspect that his disease will continue to progressive over time.  2.  Left perinephric hematoma:  Noted post biopsy, was initially subcapsular, now extracapsular.   -s/p embolization of left kidney (done on 04/11/15.)  3.  Anemia of blood loss/Post hemorrhagic anemia:  hgb trending down slowly Iron deficiency- iv iron with HD - last hgb was 04/18/15 and was stable at 10, will continue to monitor as outpt.   4.  Hyperkalemia:  K 4.4 today, will continue to monitor closely.  5. Large left pleural effusion - s/p pleural tap . 1 L of fluid was removed - ? Reactive from intra-abdominal bleed  6. Amyloidosis, unspecified - Patient has deferred bone marrow biopsy for now to be done as outpatient - Will need to discuss further with Dr. Grayland Ormond.   LOS: 13 Joseph [name] 10/25/201611:39 AM

## 2015-04-21 LAB — RENAL FUNCTION PANEL
ALBUMIN: 2.5 g/dL — AB (ref 3.5–5.0)
ANION GAP: 6 (ref 5–15)
BUN: 59 mg/dL — AB (ref 6–20)
CALCIUM: 8.2 mg/dL — AB (ref 8.9–10.3)
CHLORIDE: 107 mmol/L (ref 101–111)
CO2: 25 mmol/L (ref 22–32)
Creatinine, Ser: 4.94 mg/dL — ABNORMAL HIGH (ref 0.61–1.24)
GFR, EST AFRICAN AMERICAN: 14 mL/min — AB (ref >60)
GFR, EST NON AFRICAN AMERICAN: 12 mL/min — AB (ref >60)
Glucose, Bld: 97 mg/dL (ref 65–99)
PHOSPHORUS: 4.8 mg/dL — AB (ref 2.5–4.6)
POTASSIUM: 4.5 mmol/L (ref 3.5–5.1)
Sodium: 138 mmol/L (ref 135–145)

## 2015-04-21 LAB — BASIC METABOLIC PANEL
ANION GAP: 8 (ref 5–15)
BUN: 57 mg/dL — ABNORMAL HIGH (ref 6–20)
CALCIUM: 8.2 mg/dL — AB (ref 8.9–10.3)
CHLORIDE: 106 mmol/L (ref 101–111)
CO2: 24 mmol/L (ref 22–32)
CREATININE: 4.91 mg/dL — AB (ref 0.61–1.24)
GFR calc non Af Amer: 12 mL/min — ABNORMAL LOW (ref >60)
GFR, EST AFRICAN AMERICAN: 14 mL/min — AB (ref >60)
Glucose, Bld: 97 mg/dL (ref 65–99)
Potassium: 4.5 mmol/L (ref 3.5–5.1)
SODIUM: 138 mmol/L (ref 135–145)

## 2015-04-21 LAB — MAGNESIUM: Magnesium: 1.7 mg/dL (ref 1.7–2.4)

## 2015-04-21 MED ORDER — ONDANSETRON 4 MG PO TBDP: 4.0000 mg | ORAL_TABLET | Freq: Three times a day (TID) | ORAL | Status: DC | PRN

## 2015-04-21 MED ORDER — SENNOSIDES-DOCUSATE SODIUM 8.6-50 MG PO TABS: 1.0000 | ORAL_TABLET | Freq: Every evening | ORAL | Status: DC | PRN

## 2015-04-21 MED ORDER — NEPRO/CARBSTEADY PO LIQD: 237.0000 mL | Freq: Two times a day (BID) | ORAL | Status: DC

## 2015-04-21 MED ORDER — PANTOPRAZOLE SODIUM 40 MG PO TBEC: 40.0000 mg | DELAYED_RELEASE_TABLET | Freq: Two times a day (BID) | ORAL | Status: DC

## 2015-04-21 MED ORDER — OXYCODONE-ACETAMINOPHEN 5-325 MG PO TABS: 1.0000 | ORAL_TABLET | Freq: Four times a day (QID) | ORAL | Status: DC | PRN

## 2015-04-21 NOTE — Progress Notes (Signed)
Subjective:   Pt feeling quite well at the moment. No acute distress. Minimal left flank pain. Cr currently 4.9, no significant change.  Objective:  Vital signs in last 24 hours:  Temp:  [97.7 F (36.5 C)-98.3 F (36.8 C)] 98.2 F (36.8 C) (10/26 0416) Pulse Rate:  [76-78] 78 (10/26 0416) Resp:  [16-20] 17 (10/26 0416) BP: (134-145)/(86-90) 145/90 mmHg (10/26 0416) SpO2:  [96 %-99 %] 97 % (10/26 0416) Weight:  [88.225 kg (194 lb 8 oz)] 88.225 kg (194 lb 8 oz) (10/26 0616)  Weight change: -2.359 kg (-5 lb 3.2 oz) Filed Weights   04/19/15 0620 04/20/15 0452 04/21/15 0616  Weight: 87.181 kg (192 lb 3.2 oz) 90.583 kg (199 lb 11.2 oz) 88.225 kg (194 lb 8 oz)    Intake/Output: I/O last 3 completed shifts: In: 47 [P.O.:780] Out: 2575 [Urine:2525; Emesis/NG output:50]   Intake/Output this shift:     Physical Exam: General: No acute distress  Head: Normocephalic, atraumatic. Moist oral mucosal membranes  Eyes: Anicteric, pallor noted  Neck: Supple, trachea midline  Lungs:  Clear to auscultation normal effort  Heart: Regular rate and rhythm no rubs  Abdomen:  Soft, No rebound, BS present  Extremities: no peripheral edema.  Neurologic: Nonfocal, moving all four extremities  Skin: No rashes  Access:  Rt IJ permcath    Basic Metabolic Panel:  Recent Labs Lab 04/17/15 0432 04/17/15 0442 04/18/15 0546 04/19/15 0715 04/20/15 0608 04/21/15 0522  NA 138  --  141 138 138 138  138  K 3.4*  --  3.7 4.0 4.4 4.5  4.5  CL 103  --  103 103 103 107  106  CO2 29  --  30 27 26 25  24   GLUCOSE 109*  --  101* 91 104* 97  97  BUN 59*  --  63* 61* 60* 59*  57*  CREATININE 5.06*  --  4.83* 4.93* 5.00* 4.94*  4.91*  CALCIUM 7.5*  --  7.8* 7.8* 8.1* 8.2*  8.2*  MG  --  1.8 1.8 1.6* 1.8 1.7  PHOS 3.8  --  4.0 4.4 4.2 4.8*    Liver Function Tests:  Recent Labs Lab 04/17/15 0432 04/18/15 0546 04/19/15 0715 04/20/15 0608 04/21/15 0522  ALBUMIN 2.3* 2.3* 2.3* 2.4* 2.5*    No results for input(s): LIPASE, AMYLASE in the last 168 hours. No results for input(s): AMMONIA in the last 168 hours.  CBC:  Recent Labs Lab 04/15/15 0440 04/15/15 1146 04/16/15 0419 04/17/15 0432 04/18/15 0546  WBC 15.1* 15.3* 14.1* 14.8* 17.3*  HGB 9.2* 9.9* 9.6* 9.8* 10.0*  HCT 27.8* 29.1* 29.2* 28.7* 30.7*  MCV 87.1 87.0 87.3 87.3 86.9  PLT 288 334 370 440 506*    Cardiac Enzymes:  Recent Labs Lab 04/15/15 1146  TROPONINI 0.08*    BNP: Invalid input(s): POCBNP  CBG: No results for input(s): GLUCAP in the last 168 hours.  Microbiology: Results for orders placed or performed during the hospital encounter of 04/06/15  MRSA PCR Screening     Status: None   Collection Time: 04/07/15  9:49 AM  Result Value Ref Range Status   MRSA by PCR NEGATIVE NEGATIVE Final    Comment:        The GeneXpert MRSA Assay (FDA approved for NASAL specimens only), is one component of a comprehensive MRSA colonization surveillance program. It is not intended to diagnose MRSA infection nor to guide or monitor treatment for MRSA infections.   Body fluid culture  Status: None   Collection Time: 04/15/15  9:45 AM  Result Value Ref Range Status   Specimen Description PLEURAL  Final   Special Requests NONE  Final   Gram Stain FEW WBC SEEN NO ORGANISMS SEEN   Final   Culture NO GROWTH 3 DAYS  Final   Report Status 04/18/2015 FINAL  Final    Coagulation Studies: No results for input(s): LABPROT, INR in the last 72 hours.  Urinalysis: No results for input(s): COLORURINE, LABSPEC, PHURINE, GLUCOSEU, HGBUR, BILIRUBINUR, KETONESUR, PROTEINUR, UROBILINOGEN, NITRITE, LEUKOCYTESUR in the last 72 hours.  Invalid input(s): APPERANCEUR    Imaging: No results found.   Medications:     . antiseptic oral rinse  7 mL Mouth Rinse BID  . feeding supplement (NEPRO CARB STEADY)  237 mL Oral BID BM  . Influenza vac split quadrivalent PF  0.5 mL Intramuscular Tomorrow-1000  .  pantoprazole  40 mg Oral BID AC  . rosuvastatin  10 mg Oral q1800  . senna-docusate  1 tablet Oral BID   acetaminophen, calamine, HYDROmorphone (DILAUDID) injection, ondansetron (ZOFRAN) IV, ondansetron, oxyCODONE-acetaminophen  Assessment/ Plan:  49 y.o. male with past medical history of hypertension, hyperlipidemia, anemia chronic kidney disease, peptic ulcer disease, CKD stage IV, anemia of CKD proteinuria, hyperkalemia s/p renal bioy 04/06/15 with subsequent left perinephric hematoma.  1.  Acute renal failure/CKD stage IV/Proteinuria:   - Pt s/p left renal biopsy. Perinephric hematoma developed which was initially subcapsular, subsequently extracapsular bleeding noted.   Kidney biopsy shows severe amyloidosis which is unspecified (not AA or AL). Severe interstitial fibrosis and tubular atrophy -Renal function about the same, no acute indication to start HD at present. - will check labs in our office on Friday, pt instructed to come by office sometime on Friday.  2.  Left perinephric hematoma:  Noted post biopsy, was initially subcapsular, now extracapsular.   -s/p embolization of left kidney (done on 04/11/15.) -No heavy lifting greater than 5lbs for 4 weeks.  3.  Anemia of blood loss/Post hemorrhagic anemia:  hgb trending down slowly Iron deficiency- iv iron with HD -will follow up CBC on Friday in our office..   4.  Hyperkalemia:  Resolved, pt counselled on low K diet. .  5. Large left pleural effusion - s/p pleural tap . 1 L of fluid was removed - ? Reactive from intra-abdominal bleed  6. Amyloidosis, unspecified - Patient has deferred bone marrow biopsy for now to be done as outpatient - case discussed with Dr. Grayland Ormond, ultimately will need bone marrow biopsy to make further determination regarding treatment, pt has follow up appt with Dr. Grayland Ormond.   LOS: Joseph [name], Joseph [name] 10/26/20168:46 AM

## 2015-04-21 NOTE — Discharge Summary (Signed)
[place] [facility] [place] NAME: Joseph [name]    MR#:  [id number]  [id number]  DATE OF BIRTH:  [birth year]  DATE OF ADMISSION:  04/06/2015 ADMITTING PHYSICIAN: Munsoor Lateef, MD  DATE OF DISCHARGE:04/21/2015  PRIMARY CARE PHYSICIAN: Pcp Not In System    ADMISSION DIAGNOSIS:  Proteinuria  US guided Renal Biopsy  hematoma of left kidney End Stage Renal  DISCHARGE DIAGNOSIS:  Active Problems:   Hypotension   Perinephric hematoma   SECONDARY DIAGNOSIS:   Past Medical History  Diagnosis Date  . Allergy   . Heart murmur   . Hypertension     HOSPITAL COURSE:   * Acute renal failure/CKD stage IV progressed to ESRD. Due to Amyloid deposits- as per primary biopsy report. He got CRRT for 3 days and need HD. Per Dr. Holley Raring , on HD for now. Continue to monitor electrolytes and UOP closely.Possible AA amyloid noted on prelim renal biopsy per Dr. Candiss Norse. permacath placed 04/16/15 further diagnostic by BM biopsy- pt want to defer for now. nephrology to arrange for out pt HD. for last 3-4 days , pt's electrolyte are stable- and not needing HD. Dr. Holley Raring would like to monitor for 1-2 more days his labs to check for requirement of HD, then decide discharge plan.  For last 5-6 days his renal func and electrolytes remained stable- without requiring HD- so d/c with follow up in office after 2 days to check renal func. * Amyloidosis  Likely diagnosis from renal biopsy, Appreciated help by hematology.  Bone marrow biopsy suggested by Dr. Grayland Ormond for staging , pt does not want it now- want to feel little better first.   Informed hematologist- that pt would like to know more about staging, treatment options and out come of this disease.  Advised pt to follow in office with cancer center for further management.  * left perinephic hematoma after renal biopsy.  Perinephric hematoma developed which was initially subcapsular, later with  extracapsular bleeding. Per CT abd worsening of hematoma, along with possible adrenal gland hemorrhage.  embolization of left kidney by vascular on 04/11/15. Since then Hb stable.  * pleural effusion  S/p US guided tap- 04/15/15- caused 1 ltr bloody fluid removal.  Likely secondary to his perinephric hematoma.  Followed by severe pain, vitals stable.  No pneumothorax.  stable CBC and INR.  Slightly high troponin is expected in renal failure  pulmonologist for follow up.  Dilaudid inj for pain as needed for now.  Pain is much better controlled   Oxygen requirement is coming down.  taper oxygen to room air, and control pain only with oral meds as a preparation of possible discharge   Now on room air.  * Hyperkalemia. Improved.  * Hpotension: improved.  *Acute blood loss anemia due to 1. Hb increased from 6.6 to 7.5 after 2 unit PRBC transfusion.   Hb dropped again- 2 more units PRBC transfusion on 04/13/15.  Follow closely. Stable for now.  * Hypomagnesemia. Mag supplement. Improved.  * Leukocytosis. Possible due to hematoma. F/u CBC.  * Thrombocytopenia. Improved after given platelet transfusion and vitamin K. He got 20 mg of DDAVP IV twice per Dr. Isidore Moos. follow-up CBC.  * Hypoxia and tachycardia, due to symptomatic anemia/ESRD.  Was on O2 Aneta 5 L.  Some atelactesis on X ray and CT of chest- encouraged incentive spirometry. Xray reported large pleural effusion, s/p pleural tap now on 04/15/15.  On room air now, able to walk without any  trouble.  DISCHARGE CONDITIONS:   Stable.  CONSULTS OBTAINED:  Treatment Team:  Evlyn Kanner, NP  DRUG ALLERGIES:   Allergies  Allergen Reactions  . Ciprofloxacin Hcl   . Doxycycline Hyclate     DISCHARGE MEDICATIONS:   Current Discharge Medication List    START taking these medications   Details  Nutritional Supplements (FEEDING SUPPLEMENT, NEPRO CARB STEADY,) LIQD Take 237  mLs by mouth 2 (two) times daily between meals. Qty: 10 Can, Refills: 0    ondansetron (ZOFRAN-ODT) 4 MG disintegrating tablet Take 1 tablet (4 mg total) by mouth every 8 (eight) hours as needed for nausea or vomiting. Qty: 30 tablet, Refills: 0    oxyCODONE-acetaminophen (PERCOCET/ROXICET) 5-325 MG tablet Take 1-2 tablets by mouth every 6 (six) hours as needed for moderate pain. Qty: 30 tablet, Refills: 0    senna-docusate (SENOKOT-S) 8.6-50 MG tablet Take 1 tablet by mouth at bedtime as needed for mild constipation. Qty: 20 tablet, Refills: 0      CONTINUE these medications which have CHANGED   Details  pantoprazole (PROTONIX) 40 MG tablet Take 1 tablet (40 mg total) by mouth 2 (two) times daily before a meal. Qty: 60 tablet, Refills: 0      CONTINUE these medications which have NOT CHANGED   Details  cycloSPORINE (RESTASIS) 0.05 % ophthalmic emulsion Place 1 drop into both eyes 2 (two) times daily. Qty: 0.4 mL    ferrous sulfate 325 (65 FE) MG tablet Take 1 tablet (325 mg total) by mouth 3 (three) times daily with meals. Qty: 90 tablet, Refills: 3    rosuvastatin (CRESTOR) 10 MG tablet Take 1 tablet (10 mg total) by mouth daily. Qty: 30 tablet, Refills: 0      STOP taking these medications     irbesartan (AVAPRO) 150 MG tablet      Patiromer Sorbitex Calcium 25.2 G PACK      sodium bicarbonate 650 MG tablet          DISCHARGE INSTRUCTIONS:    Follow with nephrology , oncology clinic.  If you experience worsening of your admission symptoms, develop shortness of breath, life threatening emergency, suicidal or homicidal thoughts you must seek medical attention immediately by calling 911 or calling your MD immediately  if symptoms less severe.  You Must read complete instructions/literature along with all the possible adverse reactions/side effects for all the Medicines you take and that have been prescribed to you. Take any new Medicines after you have completely  understood and accept all the possible adverse reactions/side effects.   Please note  You were cared for by a hospitalist during your hospital stay. If you have any questions about your discharge medications or the care you received while you were in the hospital after you are discharged, you can call the unit and asked to speak with the hospitalist on call if the hospitalist that took care of you is not available. Once you are discharged, your primary care physician will handle any further medical issues. Please note that NO REFILLS for any discharge medications will be authorized once you are discharged, as it is imperative that you return to your primary care physician (or establish a relationship with a primary care physician if you do not have one) for your aftercare needs so that they can reassess your need for medications and monitor your lab values.    Today   CHIEF COMPLAINT:  No chief complaint on file.   HISTORY OF PRESENT ILLNESS:  Laiden Milles  is a 49 y.o. male with a known history of essential hypertension who originally presented 10/11 for routine renal biopsy. Underwent procedure without immediate complication however, unfortunately developed pain in the left flank CT imaging revealed expanding subcapsular hematoma. They had difficulty managing his pain throughout the day until he finally ended on 1.5 mg of Dilaudid IV every 2 hours. Fortunately this controlled his pain however he is now hypotensive.Evaluated At bedside he is currently without any complaints   VITAL SIGNS:  Blood pressure 145/90, pulse 78, temperature 98.2 F (36.8 C), temperature source Oral, resp. rate 17, height 5' 11"  (1.803 m), weight 88.225 kg (194 lb 8 oz), SpO2 97 %.  I/O:   Intake/Output Summary (Last 24 hours) at 04/21/15 0952 Last data filed at 04/21/15 6712  Gross per 24 hour  Intake    780 ml  Output   1775 ml  Net   -995 ml    PHYSICAL EXAMINATION:   GENERAL: 49 y.o.-year-old patient  lying in the bed with no acute distress.  EYES: Pupils equal, round, reactive to light and accommodation. No scleral icterus. Extraocular muscles intact.  HEENT: Head atraumatic, normocephalic. Oropharynx and nasopharynx clear. Moist oral mucosa. NECK: Supple, no jugular venous distention. No thyroid enlargement, no tenderness.  LUNGS: equal breath sound both side, no wheezing, rales,rhonchi or crepitation. No use of accessory muscles of respiration. permacath on right chest. CARDIOVASCULAR: S1, S2 normal.tachycardia with pain, No murmurs, rubs, or gallops.  ABDOMEN: Soft, nontender, nondistended. Bowel sounds present. No organomegaly or mass.  EXTREMITIES: No pedal edema, cyanosis, or clubbing.  NEUROLOGIC: Cranial nerves II through XII are intact. Muscle strength 4/5 in all extremities. Sensation intact. Gait not checked.  PSYCHIATRIC: The patient is alert and oriented x 3.  SKIN: No obvious rash, lesion, or ulcer.  DATA REVIEW:   CBC  Recent Labs Lab 04/18/15 0546  WBC 17.3*  HGB 10.0*  HCT 30.7*  PLT 506*    Chemistries   Recent Labs Lab 04/21/15 0522  NA 138  138  K 4.5  4.5  CL 107  106  CO2 25  24  GLUCOSE 97  97  BUN 59*  57*  CREATININE 4.94*  4.91*  CALCIUM 8.2*  8.2*  MG 1.7    Cardiac Enzymes  Recent Labs Lab 04/15/15 1146  TROPONINI 0.08*    Microbiology Results  Results for orders placed or performed during the hospital encounter of 04/06/15  MRSA PCR Screening     Status: None   Collection Time: 04/07/15  9:49 AM  Result Value Ref Range Status   MRSA by PCR NEGATIVE NEGATIVE Final    Comment:        The GeneXpert MRSA Assay (FDA approved for NASAL specimens only), is one component of a comprehensive MRSA colonization surveillance program. It is not intended to diagnose MRSA infection nor to guide or monitor treatment for MRSA infections.   Body fluid culture     Status: None   Collection Time: 04/15/15  9:45 AM  Result  Value Ref Range Status   Specimen Description PLEURAL  Final   Special Requests NONE  Final   Gram Stain FEW WBC SEEN NO ORGANISMS SEEN   Final   Culture NO GROWTH 3 DAYS  Final   Report Status 04/18/2015 FINAL  Final    RADIOLOGY:  No results found.  EKG:   Orders placed or performed in visit on 04/06/14  . EKG 12-Lead      Management plans discussed  with the patient, family and they are in agreement.  CODE STATUS:     Code Status Orders        Start     Ordered   04/06/15 1240  Full code   Continuous     04/06/15 1239      TOTAL TIME TAKING CARE OF THIS PATIENT: 35 minutes.    Vaughan Basta M.D on 04/21/2015 at 9:52 AM  Between 7am to 6pm - Pager - [phone]  After 6pm go to [email or web address] - password [email or web address] [facility]  Office  [phone]  CC: Primary care physician; Pcp Not In System   Note: This dictation was prepared with Dragon dictation along with smaller phrase technology. Any transcriptional errors that result from this process are unintentional.

## 2015-04-29 ENCOUNTER — Inpatient Hospital Stay: Payer: BLUE CROSS/BLUE SHIELD

## 2015-04-29 ENCOUNTER — Encounter: Payer: Self-pay | Admitting: Oncology

## 2015-04-29 ENCOUNTER — Inpatient Hospital Stay: Payer: BLUE CROSS/BLUE SHIELD | Attending: Oncology | Admitting: Oncology

## 2015-04-29 VITALS — BP 133/80 | HR 89 | Temp 96.9°F | Resp 16 | Ht 72.05 in | Wt 178.8 lb

## 2015-04-29 DIAGNOSIS — D649 Anemia, unspecified: Secondary | ICD-10-CM | POA: Diagnosis not present

## 2015-04-29 DIAGNOSIS — Z87891 Personal history of nicotine dependence: Secondary | ICD-10-CM | POA: Diagnosis not present

## 2015-04-29 DIAGNOSIS — I517 Cardiomegaly: Secondary | ICD-10-CM | POA: Diagnosis not present

## 2015-04-29 DIAGNOSIS — N186 End stage renal disease: Secondary | ICD-10-CM | POA: Diagnosis not present

## 2015-04-29 DIAGNOSIS — R011 Cardiac murmur, unspecified: Secondary | ICD-10-CM | POA: Insufficient documentation

## 2015-04-29 DIAGNOSIS — E859 Amyloidosis, unspecified: Secondary | ICD-10-CM | POA: Insufficient documentation

## 2015-04-29 DIAGNOSIS — J90 Pleural effusion, not elsewhere classified: Secondary | ICD-10-CM

## 2015-04-29 DIAGNOSIS — Z79899 Other long term (current) drug therapy: Secondary | ICD-10-CM | POA: Diagnosis not present

## 2015-04-29 DIAGNOSIS — R634 Abnormal weight loss: Secondary | ICD-10-CM | POA: Insufficient documentation

## 2015-04-29 DIAGNOSIS — R918 Other nonspecific abnormal finding of lung field: Secondary | ICD-10-CM | POA: Diagnosis not present

## 2015-04-29 DIAGNOSIS — R531 Weakness: Secondary | ICD-10-CM | POA: Diagnosis not present

## 2015-04-29 DIAGNOSIS — Z992 Dependence on renal dialysis: Secondary | ICD-10-CM | POA: Diagnosis not present

## 2015-04-29 DIAGNOSIS — R188 Other ascites: Secondary | ICD-10-CM | POA: Insufficient documentation

## 2015-04-29 DIAGNOSIS — D72829 Elevated white blood cell count, unspecified: Secondary | ICD-10-CM | POA: Diagnosis not present

## 2015-04-29 DIAGNOSIS — I120 Hypertensive chronic kidney disease with stage 5 chronic kidney disease or end stage renal disease: Secondary | ICD-10-CM | POA: Diagnosis not present

## 2015-04-29 DIAGNOSIS — R5383 Other fatigue: Secondary | ICD-10-CM | POA: Diagnosis not present

## 2015-04-29 DIAGNOSIS — I10 Essential (primary) hypertension: Secondary | ICD-10-CM | POA: Diagnosis not present

## 2015-04-29 DIAGNOSIS — N179 Acute kidney failure, unspecified: Secondary | ICD-10-CM | POA: Diagnosis not present

## 2015-04-29 DIAGNOSIS — J9811 Atelectasis: Secondary | ICD-10-CM | POA: Insufficient documentation

## 2015-04-29 DIAGNOSIS — H35719 Central serous chorioretinopathy, unspecified eye: Secondary | ICD-10-CM | POA: Insufficient documentation

## 2015-04-29 DIAGNOSIS — E2749 Other adrenocortical insufficiency: Secondary | ICD-10-CM | POA: Diagnosis not present

## 2015-04-29 LAB — SAMPLE TO BLOOD BANK

## 2015-04-29 LAB — PROTIME-INR
INR: 0.96
Prothrombin Time: 13 seconds (ref 11.4–15.0)

## 2015-04-29 LAB — CBC WITH DIFFERENTIAL/PLATELET
BASOS PCT: 1 %
Basophils Absolute: 0.2 10*3/uL — ABNORMAL HIGH (ref 0–0.1)
EOS ABS: 0.6 10*3/uL (ref 0–0.7)
Eosinophils Relative: 5 %
HCT: 34.6 % — ABNORMAL LOW (ref 40.0–52.0)
HEMOGLOBIN: 11.4 g/dL — AB (ref 13.0–18.0)
Lymphocytes Relative: 15 %
Lymphs Abs: 1.9 10*3/uL (ref 1.0–3.6)
MCH: 28.5 pg (ref 26.0–34.0)
MCHC: 32.9 g/dL (ref 32.0–36.0)
MCV: 86.6 fL (ref 80.0–100.0)
MONOS PCT: 7 %
Monocytes Absolute: 0.8 10*3/uL (ref 0.2–1.0)
NEUTROS PCT: 72 %
Neutro Abs: 9.5 10*3/uL — ABNORMAL HIGH (ref 1.4–6.5)
Platelets: 663 10*3/uL — ABNORMAL HIGH (ref 150–440)
RBC: 4 MIL/uL — ABNORMAL LOW (ref 4.40–5.90)
RDW: 16.1 % — ABNORMAL HIGH (ref 11.5–14.5)
WBC: 13 10*3/uL — AB (ref 3.8–10.6)

## 2015-04-29 LAB — APTT: aPTT: 24 seconds (ref 24–36)

## 2015-04-29 NOTE — Progress Notes (Signed)
Patient was evaluated in the hospital for Amyloidosis, Anemia, and Leukocytosis.  He is still losing weight very rapidly.

## 2015-04-30 LAB — KAPPA/LAMBDA LIGHT CHAINS
KAPPA FREE LGHT CHN: 179.08 mg/L — AB (ref 3.30–19.40)
KAPPA, LAMDA LIGHT CHAIN RATIO: 2.07 — AB (ref 0.26–1.65)
LAMDA FREE LIGHT CHAINS: 86.41 mg/L — AB (ref 5.71–26.30)

## 2015-05-03 ENCOUNTER — Telehealth: Payer: Self-pay | Admitting: *Deleted

## 2015-05-03 NOTE — Telephone Encounter (Signed)
Called to inform patient of US Guided Fat Pad Biopsy, procedure is scheduled for 05/07/15 (Patient to report to medical mall at 1:30 pm, procedure to begin at 2:30 pm. Patient to remain NPO 6 hours prior to procedure)

## 2015-05-04 ENCOUNTER — Telehealth: Payer: Self-pay | Admitting: *Deleted

## 2015-05-04 NOTE — Telephone Encounter (Signed)
He has an elevated kappa light chain. I still would prefer a BMBx, but would proceed with fat pad given all of his other problems.

## 2015-05-04 NOTE — Telephone Encounter (Signed)
I would prefer they do not wait on the biopsy, but it is understandable.  I'll talk with Lateef regarding the biopsy and his weight loss.

## 2015-05-04 NOTE — Telephone Encounter (Signed)
I spoke with patients wife and she states they would like to wait and schedule the biopsy in 1 month. She also wants you to know he continues to lose weight at home, asking if you have any recommendations between now and the time he has biopsy performed.

## 2015-05-04 NOTE — Telephone Encounter (Signed)
Patients family called to report patient wants to cancel US Guided fat pad biopsy that is scheduled for this Friday. Patient began dialysis this week, remains weak, increased weight loss and has no appetite. Patients family is concerned about labs that were drawn last week, questioning if results change the plan for fat pad biopsy vs. Bone marrow biopsy. Please advise.

## 2015-05-05 NOTE — Telephone Encounter (Signed)
Call placed to patient, informed patient that you would discuss care with Dr. Holley Raring. I also discussed need for biopsy with patient, will proceed with scheduling in 1 month, patient to call office if he is ready for biopsy prior to 1 month.

## 2015-05-06 ENCOUNTER — Other Ambulatory Visit: Payer: BLUE CROSS/BLUE SHIELD

## 2015-05-07 ENCOUNTER — Inpatient Hospital Stay: Admission: RE | Admit: 2015-05-07 | Payer: BLUE CROSS/BLUE SHIELD | Source: Ambulatory Visit

## 2015-05-07 NOTE — Telephone Encounter (Signed)
Still waiting to hear back from Dr. Holley Raring.

## 2015-05-07 NOTE — Progress Notes (Signed)
[place]  [phone] [phone]  ID: Joseph [name] OB: [birth year]  MR#: [id number]  BOF#:[id number]  Patient Care Team: Pcp Not In System as PCP - General (Emergency Medicine)  CHIEF COMPLAINT:  Chief Complaint  Patient presents with  . Hospitalization Follow-up    INTERVAL HISTORY: Patient returns to clinic today for further evaluation hospital follow-up. He continues to have significant weakness and fatigue. He continues to lose weight. He is tolerating dialysis well. She has no neurologic complaints. He denies any fevers. He denies any chest pain or shortness of breath. He has no abdominal pain. He denies any nausea, vomiting, constipation, or diarrhea. Patient offers no further specific complaints today.  REVIEW OF SYSTEMS:   Review of Systems  Constitutional: Positive for weight loss and malaise/fatigue. Negative for fever.  Respiratory: Negative.  Negative for shortness of breath.   Cardiovascular: Negative.  Negative for chest pain.  Gastrointestinal: Negative.   Genitourinary: Negative.   Musculoskeletal: Negative.   Neurological: Positive for weakness.    As per HPI. Otherwise, a complete review of systems is negatve.  PAST MEDICAL HISTORY: Past Medical History  Diagnosis Date  . Allergy   . Heart murmur   . Hypertension   . Central serous chorioretinopathy     PAST SURGICAL HISTORY: Past Surgical History  Procedure Laterality Date  . Renal biopsy, percutaneous  04/06/2015       . Peripheral vascular catheterization Left 04/11/2015    Procedure: Embolization;  Surgeon: Katha Cabal, MD;  Location: [facility];  Service: Cardiovascular;  Laterality: Left;  . Aortogram Bilateral 04/11/2015    Procedure: Aortogram;  Surgeon: Katha Cabal, MD;  Location: [facility];  Service: Cardiovascular;  Laterality: Bilateral;  . Peripheral vascular catheterization N/A 04/16/2015    Procedure:  Dialysis/Perma Catheter Insertion;  Surgeon: Katha Cabal, MD;  Location: [facility];  Service: Cardiovascular;  Laterality: N/A;  . Tonsillectomy      FAMILY HISTORY Family History  Problem Relation Age of Onset  . Hypertension Other        ADVANCED DIRECTIVES:    HEALTH MAINTENANCE: Social History  Substance Use Topics  . Smoking status: Former Smoker    Quit date: 11/04/2006  . Smokeless tobacco: Never Used  . Alcohol Use: No     Colonoscopy:  PAP:  Bone density:  Lipid panel:  Allergies  Allergen Reactions  . Ciprofloxacin Hcl   . Doxycycline Hyclate     Current Outpatient Prescriptions  Medication Sig Dispense Refill  . ondansetron (ZOFRAN-ODT) 4 MG disintegrating tablet Take 1 tablet (4 mg total) by mouth every 8 (eight) hours as needed for nausea or vomiting. 30 tablet 0  . oxyCODONE-acetaminophen (PERCOCET/ROXICET) 5-325 MG tablet Take 1-2 tablets by mouth every 6 (six) hours as needed for moderate pain. 30 tablet 0  . pantoprazole (PROTONIX) 40 MG tablet Take 1 tablet (40 mg total) by mouth 2 (two) times daily before a meal. 60 tablet 0  . VELTASSA 25.2 G PACK      No current facility-administered medications for this visit.    OBJECTIVE: Filed Vitals:   04/29/15 1130  BP: 133/80  Pulse: 89  Temp: 96.9 F (36.1 C)  Resp: 16     Body mass index is 24.22 kg/(m^2).    ECOG FS:1 - Symptomatic but completely ambulatory  General: Well-developed, well-nourished, no acute distress. Eyes: Pink conjunctiva, anicteric sclera. Lungs: Clear to auscultation bilaterally. Heart: Regular rate and rhythm.  No rubs, murmurs, or gallops. Abdomen: Soft, nontender, nondistended. No organomegaly noted, normoactive bowel sounds. Musculoskeletal: No edema, cyanosis, or clubbing. Neuro: Alert, answering all questions appropriately. Cranial nerves grossly intact. Skin: No rashes or petechiae noted. Psych: Normal affect.   LAB RESULTS:  Lab Results    Component Value Date   NA 138 04/21/2015   NA 138 04/21/2015   K 4.5 04/21/2015   K 4.5 04/21/2015   CL 107 04/21/2015   CL 106 04/21/2015   CO2 25 04/21/2015   CO2 24 04/21/2015   GLUCOSE 97 04/21/2015   GLUCOSE 97 04/21/2015   BUN 59* 04/21/2015   BUN 57* 04/21/2015   CREATININE 4.94* 04/21/2015   CREATININE 4.91* 04/21/2015   CALCIUM 8.2* 04/21/2015   CALCIUM 8.2* 04/21/2015   PROT 5.4* 04/12/2015   ALBUMIN 2.5* 04/21/2015   AST 32 04/12/2015   ALT 119* 04/12/2015   ALKPHOS 195* 04/12/2015   BILITOT 4.1* 04/12/2015   GFRNONAA 12* 04/21/2015   GFRNONAA 12* 04/21/2015   GFRAA 14* 04/21/2015   GFRAA 14* 04/21/2015    Lab Results  Component Value Date   WBC 13.0* 04/29/2015   NEUTROABS 9.5* 04/29/2015   HGB 11.4* 04/29/2015   HCT 34.6* 04/29/2015   MCV 86.6 04/29/2015   PLT 663* 04/29/2015     STUDIES: Ct Abdomen Pelvis Wo Contrast  04/10/2015  CLINICAL DATA:  49 year old male with acute renal failure superimposed on chronic kidney disease. Status post recent renal biopsy complicated by subcapsular hematoma. Acute onset of diaphoresis and intense left-sided abdominal pain 1 hour ago. EXAM: CT ABDOMEN AND PELVIS WITHOUT CONTRAST TECHNIQUE: Multidetector CT imaging of the abdomen and pelvis was performed following the standard protocol without IV contrast. COMPARISON:  CT the abdomen and pelvis without contrast 04/07/2015. FINDINGS: Lower chest: Small right and moderate left pleural effusions. The left pleural effusion is associated with complete atelectasis of the visualized portions of the left lower lobe. There are patchy areas of ground-glass attenuation in the right lung base, most evident in the right lower lobe, concerning for either developing infection, hemorrhage or sequela of recent aspiration. Hepatobiliary: No definite cystic or solid hepatic lesions are identified within the liver on today's noncontrast CT examination. Unenhanced appearance of the gallbladder  is normal. Pancreas: No pancreatic mass or definite pancreatic/ peripancreatic fluid collections identified on today's noncontrast CT examination. Spleen: Unremarkable. Adrenals/Urinary Tract: Significant interval enlargement of the previously noted subcapsular hematoma, which now appears to be extending into the perinephric soft tissues. This currently measures up to 5.1 cm in thickness (previously 1.8 cm), and is associated with increasing compression of the left renal parenchyma. Other than an exophytic a low-attenuation lesion in the upper pole of the right kidney (which is incompletely characterized, but likely a cyst), the right kidney normal in appearance on today's noncontrast CT examination. Right adrenal gland is normal in appearance. Left adrenal gland appears enlarged, and is high attenuation, suggesting recent adrenal hemorrhage. No hydroureteronephrosis. Urinary bladder is nearly completely decompressed with a Foley balloon catheter in place. Stomach/Bowel: Unenhanced appearance of the stomach is normal. No pathologic dilatation of small bowel or colon. Vascular/Lymphatic: Atherosclerotic calcifications throughout the abdominal and pelvic vasculature, without definite aneurysm. Arm catheter injuring the right femoral vein extending into the IVC (terminating below the level of the renal veins). Numerous borderline enlarged and mildly enlarged retroperitoneal lymph nodes, largest of which is in the left para-aortic nodal station adjacent to the left renal hilum, likely reactive. Reproductive: Prostate gland and seminal  vesicles are unremarkable in appearance. Other: Small volume of intermediate attenuation ascites, likely with proteinaceous or hemorrhagic contents. No pneumoperitoneum. Musculoskeletal: There are no aggressive appearing lytic or blastic lesions noted in the visualized portions of the skeleton. IMPRESSION: 1. Enlarging left renal subcapsular hematoma common now with extension into the  perinephric soft tissues of the left retroperitoneum, and increasing compression of the left renal parenchyma. Clinical correlation for signs and symptoms of worsening renovascular hypertension is suggested. 2. Associated with this, there is now enlargement and high attenuation in the left adrenal gland, presumably from spontaneous adrenal hemorrhage. 3. Moderate left pleural effusion associated with complete passive atelectasis of the visualized portions of the left lower lobe. In addition, there appears to be extensive airspace consolidation in the right lung base (most severe in the right lower lobe), which could reflect developing infection, areas of alveolar hemorrhage, or sequela of aspiration. 4. Additional incidental findings, as above. These results will be called to the ordering clinician or representative by the Radiologist Assistant, and communication documented in the PACS or zVision Dashboard. Electronically Signed   By: Vinnie Langton M.D.   On: 04/10/2015 09:40   Dg Chest 2 View  04/18/2015  CLINICAL DATA:  Shortness of breath x2 weeks, left pleural effusion, status post renal biopsy on 04/06/2015 EXAM: CHEST  2 VIEW COMPARISON:  04/15/2015 FINDINGS: Cardiomegaly. No frank interstitial edema. Moderate left pleural effusion. No pneumothorax. Right IJ dual lumen catheter terminates in the lower SVC. IMPRESSION: Cardiomegaly.  No frank interstitial edema. Moderate left pleural effusion. Electronically Signed   By: Julian Hy M.D.   On: 04/18/2015 09:59   Dg Chest 2 View  04/15/2015  CLINICAL DATA:  49 year old male status post left-sided thoracentesis EXAM: CHEST  2 VIEW COMPARISON:  Prior chest x-ray 04/14/2015 FINDINGS: No evidence of pneumothorax. Reduced left-sided pleural effusion. Persistent low lung volumes with bibasilar atelectasis. No acute osseous abnormality. IMPRESSION: No evidence of pneumothorax following left thoracentesis. Near-total resolution of left pleural effusion.  Persistent low inspiratory volumes with bibasilar atelectasis. Electronically Signed   By: Jacqulynn Cadet M.D.   On: 04/15/2015 15:44   Dg Chest 2 View  04/14/2015  CLINICAL DATA:  Renal biopsy 1 week ago with bleeding subsequently, followup pleural effusion EXAM: CHEST  2 VIEW COMPARISON:  04/12/2015 FINDINGS: Right lung remains clear. Stable cardiac enlargement. Fairly extensive hazy density over the mid to lower left lung zone unchanged. Lateral view indicates that this is related to a large left pleural effusion, with no interval change. IMPRESSION: Large left pleural effusion unchanged Electronically Signed   By: Skipper Cliche M.D.   On: 04/14/2015 10:06   Dg Chest 2 View  04/12/2015  CLINICAL DATA:  Followup hypoxia. EXAM: CHEST  2 VIEW COMPARISON:  None FINDINGS: Normal heart size. There is a large left pleural effusion. This is similar to 04/10/2015. No right pleural fluid noted. The right lung is clear. IMPRESSION: 1. Large left pleural effusion. Electronically Signed   By: Kerby Moors M.D.   On: 04/12/2015 16:07   Dg Chest Port 1 View  04/15/2015  CLINICAL DATA:  Post thoracentesis, LEFT pleural effusion, cough EXAM: PORTABLE CHEST 1 VIEW COMPARISON:  Portable exam 1831 hours compared to 04/15/2015 FINDINGS: Enlargement of cardiac silhouette. Prominent mediastinum unchanged. Low lung volumes with bibasilar effusions and atelectasis. No pneumothorax. Bones unremarkable. IMPRESSION: No pneumothorax post thoracentesis. Enlargement of cardiac silhouette with low lung volumes, bibasilar pleural effusions and bibasilar atelectasis. Electronically Signed   By: Lavonia Dana  M.D.   On: 04/15/2015 19:19   US Thoracentesis Asp Pleural Space W/img Guide  04/15/2015  CLINICAL DATA:  49 year old male with left-sided pleural effusion EXAM: ULTRASOUND GUIDED LEFT THORACENTESIS COMPARISON:  Chest x-ray 04/14/2015 PROCEDURE: An ultrasound guided thoracentesis was thoroughly discussed with the patient  and questions answered. The benefits, risks, alternatives and complications were also discussed. The patient understands and wishes to proceed with the procedure. Written consent was obtained. Ultrasound was performed to localize and mark an adequate pocket of fluid in the left chest. The area was then prepped and draped in the normal sterile fashion. 1% Lidocaine was used for local anesthesia. Under ultrasound guidance a 6 [place] Safe T centesis catheter was introduced. Thoracentesis was performed. The catheter was removed and a dressing applied. COMPLICATIONS: None. FINDINGS: A total of approximately 1200 mL of bloody pleural fluid was removed. A fluid sample wassent for laboratory analysis. IMPRESSION: Successful ultrasound guided left thoracentesis yielding 1.2 L of bloody pleural fluid. Electronically Signed   By: Jacqulynn Cadet M.D.   On: 04/15/2015 15:08    ASSESSMENT: Renal amyloidosis, unclear if AA or AL subtype.   PLAN:    1. Amyloidosis: Preliminary renal biopsy "does not show evidence for AL amyloid with negative staining for kappa and lambda light chains. Congo red staining, electron microscopy and additional studies to try to identify the molecular origin of the amyloid are pending."  Fianl pathology indicates "unclassifiable" Amyloid.  Patient has a negative SPEP but does have an elevated kappa light chain of unclear significance. Have recommended bone marrow biopsy to assess for further systemic disease. After lengthy discussion, patient wishes to delay a bone marrow biopsy and proceed with fat pad biopsy instead. Return to clinic in approximately one week after his biopsy to discuss the results and any treatment if necessary. 2. Anemia:  Improving, continue Procrit with dialysis.  3. Leukocytosis: Likely reactive, monitor. 4. End-stage renal disease: Secondary to amyloid. Continue dialysis and treatment per nephrology.     Approximately 30 minutes was spent in discussion and  consultation.    Patient expressed understanding and was in agreement with this plan. He also understands that He can call clinic at any time with any questions, concerns, or complaints.    Lloyd Huger, MD   05/07/2015 1:16 PM

## 2015-05-08 ENCOUNTER — Other Ambulatory Visit
Admission: RE | Admit: 2015-05-08 | Discharge: 2015-05-08 | Disposition: A | Discharge status: Home / Self Care | Payer: BLUE CROSS/BLUE SHIELD | Source: Ambulatory Visit | Attending: Internal Medicine | Admitting: Internal Medicine

## 2015-05-08 DIAGNOSIS — E875 Hyperkalemia: Secondary | ICD-10-CM | POA: Diagnosis present

## 2015-05-08 LAB — POTASSIUM: Potassium: 4.6 mmol/L (ref 3.5–5.1)

## 2015-05-14 ENCOUNTER — Ambulatory Visit
Admission: RE | Admit: 2015-05-14 | Discharge: 2015-05-14 | Disposition: A | Discharge status: Home / Self Care | Payer: BLUE CROSS/BLUE SHIELD | Source: Ambulatory Visit | Attending: Oncology | Admitting: Oncology

## 2015-05-14 ENCOUNTER — Other Ambulatory Visit: Payer: Self-pay | Admitting: Oncology

## 2015-05-14 ENCOUNTER — Telehealth: Payer: Self-pay | Admitting: Internal Medicine

## 2015-05-14 DIAGNOSIS — E858 Other amyloidosis: Secondary | ICD-10-CM | POA: Insufficient documentation

## 2015-05-14 DIAGNOSIS — J9811 Atelectasis: Secondary | ICD-10-CM | POA: Insufficient documentation

## 2015-05-14 DIAGNOSIS — R590 Localized enlarged lymph nodes: Secondary | ICD-10-CM | POA: Diagnosis not present

## 2015-05-14 DIAGNOSIS — K661 Hemoperitoneum: Secondary | ICD-10-CM

## 2015-05-14 DIAGNOSIS — J90 Pleural effusion, not elsewhere classified: Secondary | ICD-10-CM

## 2015-05-14 DIAGNOSIS — E859 Amyloidosis, unspecified: Secondary | ICD-10-CM

## 2015-05-14 DIAGNOSIS — R918 Other nonspecific abnormal finding of lung field: Secondary | ICD-10-CM | POA: Insufficient documentation

## 2015-05-14 DIAGNOSIS — K6289 Other specified diseases of anus and rectum: Secondary | ICD-10-CM | POA: Insufficient documentation

## 2015-05-14 DIAGNOSIS — E279 Disorder of adrenal gland, unspecified: Secondary | ICD-10-CM | POA: Diagnosis not present

## 2015-05-14 DIAGNOSIS — E8589 Other amyloidosis: Secondary | ICD-10-CM

## 2015-05-14 HISTORY — DX: Disorder of kidney and ureter, unspecified: N28.9

## 2015-05-14 MED ORDER — IOHEXOL 300 MG/ML  SOLN
100.0000 mL | Freq: Once | INTRAMUSCULAR | Status: AC | PRN
  Administered 2015-05-14: 100 mL via INTRAVENOUS

## 2015-05-14 NOTE — Telephone Encounter (Signed)
Wife (sw for [place]) Wants to discuss care plan for upcoming appt.  Was under the impression that he needed a CT prior to or at upcoming appt. On 05-18-15 .  Needs Tues or Thurs if ordering CT as he is HD patient  on m- w-f   Per Wife, Lateef (nephrologist)  also wanted Dr. Juanell Fairly to order a Pelvic and Abdominal CT as well.    [place] office number is  [phone].  Please call Claudine to discuss.  Also spoke with Patient and received permission to talk to wife.

## 2015-05-14 NOTE — Telephone Encounter (Signed)
Spoke with wife Claudine whom states they found out CT scan was ordered by Dr. Renelda Loma. Nothing further is needed from [place].

## 2015-05-18 ENCOUNTER — Encounter: Payer: Self-pay | Admitting: Internal Medicine

## 2015-05-18 ENCOUNTER — Ambulatory Visit (INDEPENDENT_AMBULATORY_CARE_PROVIDER_SITE_OTHER): Payer: BLUE CROSS/BLUE SHIELD | Admitting: Internal Medicine

## 2015-05-18 VITALS — BP 120/68 | HR 99 | Ht 72.0 in | Wt 176.4 lb

## 2015-05-18 DIAGNOSIS — J90 Pleural effusion, not elsewhere classified: Secondary | ICD-10-CM | POA: Diagnosis not present

## 2015-05-18 DIAGNOSIS — J189 Pneumonia, unspecified organism: Secondary | ICD-10-CM

## 2015-05-18 DIAGNOSIS — R05 Cough: Secondary | ICD-10-CM | POA: Diagnosis not present

## 2015-05-18 DIAGNOSIS — R091 Pleurisy: Secondary | ICD-10-CM

## 2015-05-18 DIAGNOSIS — R059 Cough, unspecified: Secondary | ICD-10-CM

## 2015-05-18 MED ORDER — AMOXICILLIN-POT CLAVULANATE 875-125 MG PO TABS: 1.0000 | ORAL_TABLET | Freq: Two times a day (BID) | ORAL | Status: DC

## 2015-05-18 MED ORDER — BENZONATATE 100 MG PO CAPS: 100.0000 mg | ORAL_CAPSULE | Freq: Three times a day (TID) | ORAL | Status: DC

## 2015-05-18 NOTE — Patient Instructions (Addendum)
--  Tessalon 100 mg three times daily for 4 weeks, 1 refill.  --robitussin DM 5 mL three times daily for as long as needed.  -Augmentin twice daily for 1 week. -Call back in the next 2 weeks. If your symptoms are not improved or if they get worse.  -Try increasing your activity level: 30 minutes of walking every day at a moderate  pace.

## 2015-05-18 NOTE — Progress Notes (Signed)
* [facility]     Assessment and Plan:  Left hemorrhagic pleural effusion. -The patient was seen in patient recently, he underwent a thoracentesis with removal of a hemorrhagic effusion which was thoughts to be secondary to kidney hemorrhage after a recent kidney biopsy.  Left pleuritic chest pain. -This is been present since a thoracentesis, is likely, location of the above hemorrhagic effusion, and should be treated symptomatically.  Pneumonia.  -CT of the chest shows some scattered groundglass changes in the right upper lung zones, which could be residual from her recent or current infectious process.  Coughing. -Possibly secondary to above.  Restrictive lung disease. -The patient has some restricted movement of the left diaphragm, likely as a complication of his left hemorrhagic pleural effusion. Explained that this is likely chronic, I would like him to start increasing his activity level as tolerated.  Amyloidosis.  Left renal hematoma.   Date: 05/18/2015  MRN# [id number] Joseph [name] [birth year]   Joseph [name] is a 49 y.o. old male seen in follow up for chief complaint of  Chief Complaint  Patient presents with  . Hospitalization Follow-up    pt. states breathing is baseline. increased SOB. occ. wheezing. dry cough x1wk. chest pain/tightness x1d.     HPI:  Patient is a 49 year old male who was admitted to the hospital recently, he seen now as a hospital discharge follow-up. At that time, he underwent a left kidney biopsy with hemorrhage,, complicated by hemorrhagic shock. He was also noted to have a left pleural effusion thought to be secondary to the bleed. He underwent a thoracentesis for removal of a large hemorrhagic effusion, he was also experiencing some pleuritic chest pain st that time. He was asked to follow up with [place] in the pulmonary office with a chest x-ray to evaluate his pleural effusion. He has subsequently had a CT of the chest which  was reviewed below.  He noted that over the last 1-2 weeks he's been having increasing cough which comes in fits. He is not noticing any increased sputum production area. He's been having stabbing left-sided chest pain occasionally since the time of the thoracentesis. He has been fairly inactive since his hospitalization.   Ct chest 05/14/15.  -Review the CT chest shows some left lower lobe atelectasis with a residual pleural thickening in this area. There is some scattered ground glass changes in the right upper zone, there is scattered mediastinal lymphadenopathy.   Medication:   Outpatient Encounter Prescriptions as of 05/18/2015  Medication Sig  . ondansetron (ZOFRAN-ODT) 4 MG disintegrating tablet Take 1 tablet (4 mg total) by mouth every 8 (eight) hours as needed for nausea or vomiting.  Marland Kitchen oxyCODONE-acetaminophen (PERCOCET/ROXICET) 5-325 MG tablet Take 1-2 tablets by mouth every 6 (six) hours as needed for moderate pain.  . pantoprazole (PROTONIX) 40 MG tablet Take 1 tablet (40 mg total) by mouth 2 (two) times daily before a meal.  . VELTASSA 25.2 G PACK    No facility-administered encounter medications on file as of 05/18/2015.     Allergies:  Ciprofloxacin hcl and Doxycycline hyclate  Review of Systems: Gen:  Denies  fever, sweats. HEENT: Denies blurred vision. Cvc:  No dizziness, chest pain or heaviness Resp:   Denies  sputum porduction. Gi: Denies swallowing difficulty, stomach pain.  Gu:  Denies bladder incontinence, burning urine Ext:   No Joint pain, stiffness. Skin: No skin rash, easy bruising. Endoc:  No polyuria, polydipsia. Psych: No depression, insomnia. Other:  All other systems  were reviewed and found to be negative other than what is mentioned in the HPI.   Physical Examination:   VS: BP 120/68 mmHg  Pulse 99  Ht 6' (1.829 m)  Wt 176 lb 6.4 oz (80.015 kg)  BMI 23.92 kg/m2  SpO2 93%  General Appearance: No distress  Neuro:without focal findings,   speech normal,  HEENT: PERRLA, EOM intact. Pulmonary: normal breath sounds, No wheezing.  Decreased air entry in the left lung. CardiovascularNormal S1,S2.  No m/r/g.   Abdomen: Benign, Soft, non-tender. Renal:  No costovertebral tenderness  GU:  Not performed at this time. Endoc: No evident thyromegaly, no signs of acromegaly. Skin:   warm, no rash. Extremities: normal, no cyanosis, clubbing.   LABORATORY PANEL:   CBC No results for input(s): WBC, HGB, HCT, PLT in the last 168 hours. ------------------------------------------------------------------------------------------------------------------  Chemistries  No results for input(s): NA, K, CL, CO2, GLUCOSE, BUN, CREATININE, CALCIUM, MG, AST, ALT, ALKPHOS, BILITOT in the last 168 hours.  Invalid input(s): GFRCGP ------------------------------------------------------------------------------------------------------------------  Cardiac Enzymes No results for input(s): TROPONINI in the last 168 hours. ------------------------------------------------------------  RADIOLOGY:   No results found for this or any previous visit. Results for orders placed during the hospital encounter of 04/06/15  DG Chest 2 View   Narrative CLINICAL DATA:  Shortness of breath x2 weeks, left pleural effusion, status post renal biopsy on 04/06/2015  EXAM: CHEST  2 VIEW  COMPARISON:  04/15/2015  FINDINGS: Cardiomegaly. No frank interstitial edema. Moderate left pleural effusion. No pneumothorax.  Right IJ dual lumen catheter terminates in the lower SVC.  IMPRESSION: Cardiomegaly.  No frank interstitial edema.  Moderate left pleural effusion.   Electronically Signed   By: Julian Hy M.D.   On: 04/18/2015 09:59    ------------------------------------------------------------------------------------------------------------------  Thank  you for allowing [facility] to assist in the care of your patient.  Our recommendations are noted above.  Please contact us if we can be of further service.   Marda Stalker, MD.  [facility] Office Number: [phone]  Patricia Pesa, M.D.  Vilinda Boehringer, M.D.  Merton Border, M.D

## 2015-05-27 ENCOUNTER — Encounter: Payer: Self-pay | Admitting: Oncology

## 2015-05-28 ENCOUNTER — Telehealth: Payer: Self-pay | Admitting: *Deleted

## 2015-05-28 DIAGNOSIS — J90 Pleural effusion, not elsewhere classified: Secondary | ICD-10-CM

## 2015-05-28 MED ORDER — AZITHROMYCIN 250 MG PO TABS: ORAL_TABLET | ORAL | Status: DC

## 2015-05-28 NOTE — Telephone Encounter (Signed)
Pt wife calling stating that the cough suppressants are not working, pt has had to use over the counter things to try and help but they are not working either.  he went to dialysis and they listened to his lung and they heard a "rattle"  Was then told to call us for he needs to be checked out soon.  It is more on the left side she states. Does not want to wait all weekend. It is getting rough on patient wife states. They think he may need to do another antibiotic.  They use [facility]  If we can send in something else to help the cough.  PCP about 6 m ago prescribed him something with Hydrocodone and that seemed to only work. Would like to know if this is an option to help this time.  Please advise

## 2015-05-28 NOTE — Telephone Encounter (Signed)
Spoke with DR and he advised for pt to take Zpak and get CXR after abx finished. Gave pt samples of Delsym for cough since pt states nothing he was given isn't helping. abx sent to pharmacy and CXR order placed. F/u appt made with KK for next week after CXR and abx finished. Nothing further needed.

## 2015-05-28 NOTE — Telephone Encounter (Signed)
You seen pt on 05/18/15 for pleural effusion, PNA and cough. Pt states he has finished ABX and is having pain at Lt rib cage and states he can't lay on left side due to it causing SOB. Says pain in Lt side is a sharp, thrusting pain. Says his nurse from dialysis says his RT lung sounds fine but they hear "rattling" in left lung. Pt still has NP cough. Says he was hot to the touch yesterday. Please advise.

## 2015-05-31 ENCOUNTER — Ambulatory Visit
Admission: RE | Admit: 2015-05-31 | Discharge: 2015-05-31 | Disposition: A | Discharge status: Home / Self Care | Payer: BLUE CROSS/BLUE SHIELD | Source: Ambulatory Visit | Attending: Oncology | Admitting: Oncology

## 2015-05-31 DIAGNOSIS — Z8639 Personal history of other endocrine, nutritional and metabolic disease: Secondary | ICD-10-CM | POA: Insufficient documentation

## 2015-05-31 DIAGNOSIS — E859 Amyloidosis, unspecified: Secondary | ICD-10-CM

## 2015-05-31 HISTORY — DX: Pneumonia, unspecified organism: J18.9

## 2015-05-31 HISTORY — DX: Disease of blood and blood-forming organs, unspecified: D75.9

## 2015-05-31 HISTORY — DX: Gastro-esophageal reflux disease without esophagitis: K21.9

## 2015-05-31 LAB — CBC
HEMATOCRIT: 25.1 % — AB (ref 40.0–52.0)
Hemoglobin: 8.3 g/dL — ABNORMAL LOW (ref 13.0–18.0)
MCH: 28.2 pg (ref 26.0–34.0)
MCHC: 33.1 g/dL (ref 32.0–36.0)
MCV: 85 fL (ref 80.0–100.0)
PLATELETS: 453 10*3/uL — AB (ref 150–440)
RBC: 2.95 MIL/uL — AB (ref 4.40–5.90)
RDW: 16.2 % — AB (ref 11.5–14.5)
WBC: 11.3 10*3/uL — AB (ref 3.8–10.6)

## 2015-05-31 LAB — APTT: APTT: 32 s (ref 24–36)

## 2015-05-31 LAB — PROTIME-INR
INR: 1.06
Prothrombin Time: 14 seconds (ref 11.4–15.0)

## 2015-05-31 MED ORDER — FENTANYL CITRATE (PF) 100 MCG/2ML IJ SOLN
INTRAMUSCULAR | Status: AC
  Filled 2015-05-31: qty 2

## 2015-05-31 MED ORDER — SODIUM CHLORIDE 0.9 % IV SOLN
INTRAVENOUS | Status: DC
  Administered 2015-05-31: 09:00:00 via INTRAVENOUS

## 2015-05-31 MED ORDER — MIDAZOLAM HCL 5 MG/5ML IJ SOLN
INTRAMUSCULAR | Status: AC
  Filled 2015-05-31: qty 5

## 2015-05-31 MED ORDER — HYDROCODONE-ACETAMINOPHEN 5-325 MG PO TABS: 1.0000 | ORAL_TABLET | ORAL | Status: DC | PRN

## 2015-05-31 NOTE — Procedures (Signed)
Discussed procedure and risks with patient. Informed consent obtained. Will US-guided fat pad biopsy.

## 2015-05-31 NOTE — Procedures (Signed)
Under US guidance, fat pad biopsy was performed in left flank region. No immediate complication.

## 2015-06-01 ENCOUNTER — Other Ambulatory Visit: Payer: BLUE CROSS/BLUE SHIELD

## 2015-06-01 ENCOUNTER — Ambulatory Visit: Admission: RE | Admit: 2015-06-01 | Payer: BLUE CROSS/BLUE SHIELD | Source: Ambulatory Visit

## 2015-06-02 ENCOUNTER — Ambulatory Visit
Admission: RE | Admit: 2015-06-02 | Discharge: 2015-06-02 | Disposition: A | Discharge status: Home / Self Care | Payer: BLUE CROSS/BLUE SHIELD | Source: Ambulatory Visit | Attending: Internal Medicine | Admitting: Internal Medicine

## 2015-06-02 ENCOUNTER — Ambulatory Visit (INDEPENDENT_AMBULATORY_CARE_PROVIDER_SITE_OTHER): Payer: BLUE CROSS/BLUE SHIELD | Admitting: Internal Medicine

## 2015-06-02 ENCOUNTER — Encounter: Payer: Self-pay | Admitting: Internal Medicine

## 2015-06-02 VITALS — BP 112/80 | HR 98 | Ht 72.0 in | Wt 174.8 lb

## 2015-06-02 DIAGNOSIS — J449 Chronic obstructive pulmonary disease, unspecified: Secondary | ICD-10-CM

## 2015-06-02 DIAGNOSIS — J9811 Atelectasis: Secondary | ICD-10-CM | POA: Diagnosis not present

## 2015-06-02 DIAGNOSIS — J90 Pleural effusion, not elsewhere classified: Secondary | ICD-10-CM | POA: Diagnosis present

## 2015-06-02 LAB — SURGICAL PATHOLOGY

## 2015-06-02 MED ORDER — ALBUTEROL SULFATE HFA 108 (90 BASE) MCG/ACT IN AERS: 2.0000 | INHALATION_SPRAY | Freq: Four times a day (QID) | RESPIRATORY_TRACT | Status: DC | PRN

## 2015-06-02 MED ORDER — FLUTICASONE FUROATE-VILANTEROL 100-25 MCG/INH IN AEPB: 1.0000 | INHALATION_SPRAY | Freq: Every day | RESPIRATORY_TRACT | Status: AC

## 2015-06-02 MED ORDER — FLUTICASONE FUROATE-VILANTEROL 100-25 MCG/INH IN AEPB: 1.0000 | INHALATION_SPRAY | Freq: Every day | RESPIRATORY_TRACT | Status: DC

## 2015-06-02 NOTE — Progress Notes (Signed)
[facility] Consultation     Date: 06/02/2015,   MRN# [id number] Joseph [name] 10/12/1965 Code Status:  Hosp day:@LENGTHOFSTAYDAYS @ Referring MD: @ATDPROV @     PCP:      AdmissionWeight: 174 lb 12.8 oz (79.289 kg)                 CurrentWeight: 174 lb 12.8 oz (79.289 kg)   CC: follow up SOB, cough HPI Patient with chronic cough for approx 1 year Worsening cough symptoms since last 3 months New dialysis patient for Amyloidosis Smoked 1.5 ppd for 30 years CXR reveiwed 06/02/2015  No leg swelling  Noted H/o kidney biopsy s/p hemothorax           MEDICATIONS    Home Medication:  Current Outpatient Rx  Name  Route  Sig  Dispense  Refill  . ondansetron (ZOFRAN-ODT) 4 MG disintegrating tablet   Oral   Take 1 tablet (4 mg total) by mouth every 8 (eight) hours as needed for nausea or vomiting.   30 tablet   0   . oxyCODONE-acetaminophen (PERCOCET/ROXICET) 5-325 MG tablet   Oral   Take 1 tablet by mouth every 4 (four) hours as needed for severe pain.         . pantoprazole (PROTONIX) 40 MG tablet   Oral   Take 1 tablet (40 mg total) by mouth 2 (two) times daily before a meal.   60 tablet   0   . albuterol (PROVENTIL HFA;VENTOLIN HFA) 108 (90 BASE) MCG/ACT inhaler   Inhalation   Inhale 2 puffs into the lungs every 6 (six) hours as needed for wheezing or shortness of breath.   1 Inhaler   2     Current Medication:   Current outpatient prescriptions:  .  ondansetron (ZOFRAN-ODT) 4 MG disintegrating tablet, Take 1 tablet (4 mg total) by mouth every 8 (eight) hours as needed for nausea or vomiting., Disp: 30 tablet, Rfl: 0 .  oxyCODONE-acetaminophen (PERCOCET/ROXICET) 5-325 MG tablet, Take 1 tablet by mouth every 4 (four) hours as needed for severe pain., Disp: , Rfl:  .  pantoprazole (PROTONIX) 40 MG tablet, Take 1 tablet (40 mg total) by mouth 2 (two) times daily before a meal., Disp: 60 tablet, Rfl: 0 .  albuterol (PROVENTIL HFA;VENTOLIN  HFA) 108 (90 BASE) MCG/ACT inhaler, Inhale 2 puffs into the lungs every 6 (six) hours as needed for wheezing or shortness of breath., Disp: 1 Inhaler, Rfl: 2     ALLERGIES   Ciprofloxacin hcl and Doxycycline hyclate     REVIEW OF SYSTEMS   Review of Systems  Constitutional: Positive for malaise/fatigue. Negative for fever, chills and weight loss.  Respiratory: Positive for cough, shortness of breath and wheezing.   Cardiovascular: Negative for chest pain, orthopnea and leg swelling.  Gastrointestinal: Negative for nausea and abdominal pain.     VS: BP 112/80 mmHg  Pulse 98  Ht 6' (1.829 m)  Wt 174 lb 12.8 oz (79.289 kg)  BMI 23.70 kg/m2  SpO2 99%     PHYSICAL EXAM   Physical Exam  Constitutional: No distress.  HENT:  Head: Normocephalic and atraumatic.  Neck: Neck supple.  Cardiovascular: Normal rate, regular rhythm and normal heart sounds.   No murmur heard. Pulmonary/Chest: Effort normal and breath sounds normal. No stridor. No respiratory distress. He has no wheezes. He has no rales.  Skin: He is not diaphoretic.        LABS    Recent Labs  05/31/15  0745  HGB  8.3*  HCT  25.1*  MCV  85.0  WBC  11.3*  INR  1.06  ,   IMAGING    Dg Chest 2 View  06/02/2015  CLINICAL DATA:  Productive cough . EXAM: CHEST  2 VIEW COMPARISON:  CT 05/14/2015.  Chest x-ray 04/18/2015 . FINDINGS: Right IJ dual-lumen catheter stable position. Mediastinum and hilar structures are unremarkable. Bibasilar subsegmental atelectasis and/or infiltrates are again noted. Small left pleural effusion no pneumothorax. Heart size stable. Surgical R and upper abdomen . IMPRESSION: 1. Right IJ dual-lumen catheter stable position. 2. Mild bibasilar atelectasis and/or infiltrates again noted. Small left pleural effusion. Electronically Signed   By: Marcello Moores  Register   On: 06/02/2015 09:02       ASSESSMENT/PLAN   48 yo white male with new Dx of Amyloidosis ESRD on HD with chronic  cough with h/o heavy smoking findings c/w COPD  1.start Breo once daily 2.albuterol as needed 3.will need PFT's and 6 MWT in 1 month 4.cough suppressants as needed   Follow up in 1 month   I have personally obtained a history, examined the patient, evaluated laboratory and independently reviewed imaging results, formulated the assessment and plan and placed orders.  The Patient requires high complexity decision making for assessment and support, frequent evaluation and titration of therapies, application of advanced monitoring technologies and extensive interpretation of multiple databases.  Patient/Family are satisfied with Plan of action and management. All questions answered   Corrin Parker, M.D.  [facility]  Medical Director [place] Director [facility]

## 2015-06-02 NOTE — Patient Instructions (Signed)
Chronic Obstructive Pulmonary Disease Chronic obstructive pulmonary disease (COPD) is a common lung condition in which airflow from the lungs is limited. COPD is a general term that can be used to describe many different lung problems that limit airflow, including both chronic bronchitis and emphysema. If you have COPD, your lung function will probably never return to normal, but there are measures you can take to improve lung function and make yourself feel better. CAUSES   Smoking (common).  Exposure to secondhand smoke.  Genetic problems.  Chronic inflammatory lung diseases or recurrent infections. SYMPTOMS  Shortness of breath, especially with physical activity.  Deep, persistent (chronic) cough with a large amount of thick mucus.  Wheezing.  Rapid breaths (tachypnea).  Gray or bluish discoloration (cyanosis) of the skin, especially in your fingers, toes, or lips.  Fatigue.  Weight loss.  Frequent infections or episodes when breathing symptoms become much worse (exacerbations).  Chest tightness. DIAGNOSIS Your health care provider will take a medical history and perform a physical examination to diagnose COPD. Additional tests for COPD may include:  Lung (pulmonary) function tests.  Chest X-ray.  CT scan.  Blood tests. TREATMENT  Treatment for COPD may include:  Inhaler and nebulizer medicines. These help manage the symptoms of COPD and make your breathing more comfortable.  Supplemental oxygen. Supplemental oxygen is only helpful if you have a low oxygen level in your blood.  Exercise and physical activity. These are beneficial for nearly all people with COPD.  Lung surgery or transplant.  Nutrition therapy to gain weight, if you are underweight.  Pulmonary rehabilitation. This may involve working with a team of health care providers and specialists, such as respiratory, occupational, and physical therapists. HOME CARE INSTRUCTIONS  Take all medicines  (inhaled or pills) as directed by your health care provider.  Avoid over-the-counter medicines or cough syrups that dry up your airway (such as antihistamines) and slow down the elimination of secretions unless instructed otherwise by your health care provider.  If you are a smoker, the most important thing that you can do is stop smoking. Continuing to smoke will cause further lung damage and breathing trouble. Ask your health care provider for help with quitting smoking. He or she can direct you to community resources or hospitals that provide support.  Avoid exposure to irritants such as smoke, chemicals, and fumes that aggravate your breathing.  Use oxygen therapy and pulmonary rehabilitation if directed by your health care provider. If you require home oxygen therapy, ask your health care provider whether you should purchase a pulse oximeter to measure your oxygen level at home.  Avoid contact with individuals who have a contagious illness.  Avoid extreme temperature and humidity changes.  Eat healthy foods. Eating smaller, more frequent meals and resting before meals may help you maintain your strength.  Stay active, but balance activity with periods of rest. Exercise and physical activity will help you maintain your ability to do things you want to do.  Preventing infection and hospitalization is very important when you have COPD. Make sure to receive all the vaccines your health care provider recommends, especially the pneumococcal and influenza vaccines. Ask your health care provider whether you need a pneumonia vaccine.  Learn and use relaxation techniques to manage stress.  Learn and use controlled breathing techniques as directed by your health care provider. Controlled breathing techniques include:  Pursed lip breathing. Start by breathing in (inhaling) through your nose for 1 second. Then, purse your lips as if you were   going to whistle and breathe out (exhale) through the  pursed lips for 2 seconds.  Diaphragmatic breathing. Start by putting one hand on your abdomen just above your waist. Inhale slowly through your nose. The hand on your abdomen should move out. Then purse your lips and exhale slowly. You should be able to feel the hand on your abdomen moving in as you exhale.  Learn and use controlled coughing to clear mucus from your lungs. Controlled coughing is a series of short, progressive coughs. The steps of controlled coughing are: 1. Lean your head slightly forward. 2. Breathe in deeply using diaphragmatic breathing. 3. Try to hold your breath for 3 seconds. 4. Keep your mouth slightly open while coughing twice. 5. Spit any mucus out into a tissue. 6. Rest and repeat the steps once or twice as needed. SEEK MEDICAL CARE IF:  You are coughing up more mucus than usual.  There is a change in the color or thickness of your mucus.  Your breathing is more labored than usual.  Your breathing is faster than usual. SEEK IMMEDIATE MEDICAL CARE IF:  You have shortness of breath while you are resting.  You have shortness of breath that prevents you from:  Being able to talk.  Performing your usual physical activities.  You have chest pain lasting longer than 5 minutes.  Your skin color is more cyanotic than usual.  You measure low oxygen saturations for longer than 5 minutes with a pulse oximeter. MAKE SURE YOU:  Understand these instructions.  Will watch your condition.  Will get help right away if you are not doing well or get worse.   This information is not intended to replace advice given to you by your health care provider. Make sure you discuss any questions you have with your health care provider.   Document Released: 03/22/2005 Document Revised: 07/03/2014 Document Reviewed: 02/06/2013 Elsevier Interactive Patient Education 2016 [employer]  

## 2015-06-08 ENCOUNTER — Ambulatory Visit: Payer: BLUE CROSS/BLUE SHIELD | Admitting: Oncology

## 2015-06-09 ENCOUNTER — Inpatient Hospital Stay: Payer: BLUE CROSS/BLUE SHIELD

## 2015-06-09 ENCOUNTER — Inpatient Hospital Stay: Payer: BLUE CROSS/BLUE SHIELD | Attending: Oncology | Admitting: Oncology

## 2015-06-09 VITALS — BP 121/75 | HR 100 | Temp 97.8°F | Resp 18 | Wt 179.2 lb

## 2015-06-09 DIAGNOSIS — Z79899 Other long term (current) drug therapy: Secondary | ICD-10-CM | POA: Diagnosis not present

## 2015-06-09 DIAGNOSIS — H35719 Central serous chorioretinopathy, unspecified eye: Secondary | ICD-10-CM | POA: Diagnosis not present

## 2015-06-09 DIAGNOSIS — R531 Weakness: Secondary | ICD-10-CM | POA: Diagnosis not present

## 2015-06-09 DIAGNOSIS — R011 Cardiac murmur, unspecified: Secondary | ICD-10-CM | POA: Diagnosis not present

## 2015-06-09 DIAGNOSIS — E859 Amyloidosis, unspecified: Secondary | ICD-10-CM | POA: Insufficient documentation

## 2015-06-09 DIAGNOSIS — D72829 Elevated white blood cell count, unspecified: Secondary | ICD-10-CM | POA: Diagnosis not present

## 2015-06-09 DIAGNOSIS — K219 Gastro-esophageal reflux disease without esophagitis: Secondary | ICD-10-CM | POA: Insufficient documentation

## 2015-06-09 DIAGNOSIS — Z8701 Personal history of pneumonia (recurrent): Secondary | ICD-10-CM | POA: Diagnosis not present

## 2015-06-09 DIAGNOSIS — I120 Hypertensive chronic kidney disease with stage 5 chronic kidney disease or end stage renal disease: Secondary | ICD-10-CM | POA: Diagnosis not present

## 2015-06-09 DIAGNOSIS — I10 Essential (primary) hypertension: Secondary | ICD-10-CM | POA: Diagnosis not present

## 2015-06-09 DIAGNOSIS — D649 Anemia, unspecified: Secondary | ICD-10-CM | POA: Diagnosis not present

## 2015-06-09 DIAGNOSIS — Z87891 Personal history of nicotine dependence: Secondary | ICD-10-CM

## 2015-06-09 DIAGNOSIS — J90 Pleural effusion, not elsewhere classified: Secondary | ICD-10-CM | POA: Diagnosis not present

## 2015-06-09 DIAGNOSIS — N186 End stage renal disease: Secondary | ICD-10-CM | POA: Diagnosis not present

## 2015-06-09 DIAGNOSIS — D759 Disease of blood and blood-forming organs, unspecified: Secondary | ICD-10-CM | POA: Diagnosis not present

## 2015-06-09 DIAGNOSIS — R05 Cough: Secondary | ICD-10-CM | POA: Diagnosis not present

## 2015-06-09 DIAGNOSIS — R5383 Other fatigue: Secondary | ICD-10-CM | POA: Diagnosis not present

## 2015-06-09 LAB — CBC
HCT: 25.9 % — ABNORMAL LOW (ref 40.0–52.0)
Hemoglobin: 8.4 g/dL — ABNORMAL LOW (ref 13.0–18.0)
MCH: 27.6 pg (ref 26.0–34.0)
MCHC: 32.5 g/dL (ref 32.0–36.0)
MCV: 84.8 fL (ref 80.0–100.0)
PLATELETS: 501 10*3/uL — AB (ref 150–440)
RBC: 3.05 MIL/uL — ABNORMAL LOW (ref 4.40–5.90)
RDW: 17.7 % — AB (ref 11.5–14.5)
WBC: 12.9 10*3/uL — AB (ref 3.8–10.6)

## 2015-06-10 LAB — KAPPA/LAMBDA LIGHT CHAINS
KAPPA FREE LGHT CHN: 123.37 mg/L — AB (ref 3.30–19.40)
Kappa, lambda light chain ratio: 1.88 — ABNORMAL HIGH (ref 0.26–1.65)
Lambda free light chains: 65.63 mg/L — ABNORMAL HIGH (ref 5.71–26.30)

## 2015-06-15 ENCOUNTER — Other Ambulatory Visit: Payer: Self-pay | Admitting: *Deleted

## 2015-06-15 ENCOUNTER — Other Ambulatory Visit: Payer: Self-pay | Admitting: Radiology

## 2015-06-16 ENCOUNTER — Ambulatory Visit
Admission: RE | Admit: 2015-06-16 | Discharge: 2015-06-16 | Disposition: A | Discharge status: Home / Self Care | Payer: BLUE CROSS/BLUE SHIELD | Source: Ambulatory Visit | Attending: Oncology | Admitting: Oncology

## 2015-06-16 DIAGNOSIS — E859 Amyloidosis, unspecified: Secondary | ICD-10-CM | POA: Diagnosis present

## 2015-06-16 LAB — PROTIME-INR
INR: 1.05
Prothrombin Time: 13.9 seconds (ref 11.4–15.0)

## 2015-06-16 LAB — DIFFERENTIAL
BASOS ABS: 0.1 10*3/uL (ref 0–0.1)
Basophils Relative: 1 %
Eosinophils Absolute: 0.8 10*3/uL — ABNORMAL HIGH (ref 0–0.7)
Eosinophils Relative: 7 %
LYMPHS ABS: 2.5 10*3/uL (ref 1.0–3.6)
Lymphocytes Relative: 22 %
MONO ABS: 0.7 10*3/uL (ref 0.2–1.0)
MONOS PCT: 6 %
NEUTROS ABS: 7.2 10*3/uL — AB (ref 1.4–6.5)
Neutrophils Relative %: 64 %

## 2015-06-16 LAB — CBC
HCT: 27.2 % — ABNORMAL LOW (ref 40.0–52.0)
HEMOGLOBIN: 8.7 g/dL — AB (ref 13.0–18.0)
MCH: 27.5 pg (ref 26.0–34.0)
MCHC: 32 g/dL (ref 32.0–36.0)
MCV: 85.9 fL (ref 80.0–100.0)
Platelets: 501 10*3/uL — ABNORMAL HIGH (ref 150–440)
RBC: 3.16 MIL/uL — AB (ref 4.40–5.90)
RDW: 17.9 % — ABNORMAL HIGH (ref 11.5–14.5)
WBC: 11.4 10*3/uL — ABNORMAL HIGH (ref 3.8–10.6)

## 2015-06-16 MED ORDER — SODIUM CHLORIDE 0.9 % IV SOLN
INTRAVENOUS | Status: DC
Start: 2015-06-16 — End: 2015-06-17
  Administered 2015-06-16: 13:00:00 via INTRAVENOUS

## 2015-06-16 MED ORDER — FENTANYL CITRATE (PF) 100 MCG/2ML IJ SOLN
INTRAMUSCULAR | Status: AC | PRN
  Administered 2015-06-16: 25 ug via INTRAVENOUS
  Administered 2015-06-16: 50 ug via INTRAVENOUS

## 2015-06-16 MED ORDER — MIDAZOLAM HCL 5 MG/5ML IJ SOLN
INTRAMUSCULAR | Status: AC | PRN
  Administered 2015-06-16: 1 mg via INTRAVENOUS
  Administered 2015-06-16: 0.5 mg via INTRAVENOUS
  Administered 2015-06-16: 1 mg via INTRAVENOUS
  Administered 2015-06-16: 0.5 mg via INTRAVENOUS

## 2015-06-16 NOTE — Progress Notes (Signed)
[facility]  Telephone:(336) [phone] Fax:(336) [phone]  ID: Joseph [name] OB: August 23, 1965  MR#: [id number]  [id number]  Patient Care Team: Marjean Donna, MD as PCP - General (Family Medicine)  CHIEF COMPLAINT:  Chief Complaint  Patient presents with  . Amyloidosis    INTERVAL HISTORY: Patient returns to clinic today for further evaluation and discussion of his fat-pad biopsy results. His weakness and fatigue have significantly improved and he feels nearly back to his baseline. He no longer is losing weight. He is tolerating dialysis well. He has no neurologic complaints. He denies any fevers. He denies any chest pain or shortness of breath. He has no abdominal pain. He denies any nausea, vomiting, constipation, or diarrhea. Patient offers no further specific complaints today.  REVIEW OF SYSTEMS:   Review of Systems  Constitutional: Negative for fever, weight loss and malaise/fatigue.  Respiratory: Negative.  Negative for shortness of breath.   Cardiovascular: Negative.  Negative for chest pain.  Gastrointestinal: Negative.   Genitourinary: Negative.   Musculoskeletal: Negative.   Neurological: Negative.  Negative for weakness.    As per HPI. Otherwise, a complete review of systems is negatve.  PAST MEDICAL HISTORY: Past Medical History  Diagnosis Date  . Allergy   . Heart murmur   . Hypertension   . Central serous chorioretinopathy   . Renal insufficiency   . Pneumonia     as child  . GERD (gastroesophageal reflux disease)   . Blood dyscrasia     PAST SURGICAL HISTORY: Past Surgical History  Procedure Laterality Date  . Renal biopsy, percutaneous  04/06/2015       . Peripheral vascular catheterization Left 04/11/2015    Procedure: Embolization;  Surgeon: Katha Cabal, MD;  Location: [facility];  Service: Cardiovascular;  Laterality: Left;  . Aortogram Bilateral 04/11/2015    Procedure: Aortogram;  Surgeon: Katha Cabal,  MD;  Location: [facility];  Service: Cardiovascular;  Laterality: Bilateral;  . Peripheral vascular catheterization N/A 04/16/2015    Procedure: Dialysis/Perma Catheter Insertion;  Surgeon: Katha Cabal, MD;  Location: [facility];  Service: Cardiovascular;  Laterality: N/A;  . Tonsillectomy      FAMILY HISTORY Family History  Problem Relation Age of Onset  . Hypertension Other        ADVANCED DIRECTIVES:    HEALTH MAINTENANCE: Social History  Substance Use Topics  . Smoking status: Former Smoker    Quit date: 11/04/2006  . Smokeless tobacco: Never Used  . Alcohol Use: No     Colonoscopy:  PAP:  Bone density:  Lipid panel:  Allergies  Allergen Reactions  . Ciprofloxacin Hcl   . Doxycycline Hyclate   . Phenol-Glycerin Swelling    Current Outpatient Prescriptions  Medication Sig Dispense Refill  . albuterol (PROVENTIL HFA;VENTOLIN HFA) 108 (90 BASE) MCG/ACT inhaler Inhale 2 puffs into the lungs every 6 (six) hours as needed for wheezing or shortness of breath. 1 Inhaler 2  . Fluticasone Furoate-Vilanterol 100-25 MCG/INH AEPB Inhale 1 puff into the lungs daily. 60 each 11  . ondansetron (ZOFRAN-ODT) 4 MG disintegrating tablet Take 1 tablet (4 mg total) by mouth every 8 (eight) hours as needed for nausea or vomiting. 30 tablet 0  . oxyCODONE-acetaminophen (PERCOCET/ROXICET) 5-325 MG tablet Take 1 tablet by mouth every 4 (four) hours as needed for severe pain.    . pantoprazole (PROTONIX) 40 MG tablet Take 1 tablet (40 mg total) by mouth 2 (two) times daily before a  meal. 60 tablet 0   No current facility-administered medications for this visit.   Facility-Administered Medications Ordered in Other Visits  Medication Dose Route Frequency Provider Last Rate Last Dose  . 0.9 %  sodium chloride infusion   Intravenous Continuous Ascencion Dike, PA-C 10 mL/hr at 06/16/15 1312      OBJECTIVE: Filed Vitals:   06/09/15 1037  BP: 121/75  Pulse: 100    Temp: 97.8 F (36.6 C)  Resp: 18     Body mass index is 24.3 kg/(m^2).    ECOG FS:0 - Asymptomatic  General: Well-developed, well-nourished, no acute distress. Eyes: Pink conjunctiva, anicteric sclera. Lungs: Clear to auscultation bilaterally. Heart: Regular rate and rhythm. No rubs, murmurs, or gallops. Abdomen: Soft, nontender, nondistended. No organomegaly noted, normoactive bowel sounds. Musculoskeletal: No edema, cyanosis, or clubbing. Neuro: Alert, answering all questions appropriately. Cranial nerves grossly intact. Skin: No rashes or petechiae noted. Psych: Normal affect.   LAB RESULTS:  Lab Results  Component Value Date   NA 138 04/21/2015   NA 138 04/21/2015   K 4.6 05/08/2015   CL 107 04/21/2015   CL 106 04/21/2015   CO2 25 04/21/2015   CO2 24 04/21/2015   GLUCOSE 97 04/21/2015   GLUCOSE 97 04/21/2015   BUN 59* 04/21/2015   BUN 57* 04/21/2015   CREATININE 4.94* 04/21/2015   CREATININE 4.91* 04/21/2015   CALCIUM 8.2* 04/21/2015   CALCIUM 8.2* 04/21/2015   PROT 5.4* 04/12/2015   ALBUMIN 2.5* 04/21/2015   AST 32 04/12/2015   ALT 119* 04/12/2015   ALKPHOS 195* 04/12/2015   BILITOT 4.1* 04/12/2015   GFRNONAA 12* 04/21/2015   GFRNONAA 12* 04/21/2015   GFRAA 14* 04/21/2015   GFRAA 14* 04/21/2015    Lab Results  Component Value Date   WBC 11.4* 06/16/2015   NEUTROABS 7.2* 06/16/2015   HGB 8.7* 06/16/2015   HCT 27.2* 06/16/2015   MCV 85.9 06/16/2015   PLT 501* 06/16/2015     STUDIES: Dg Chest 2 View  06/02/2015  CLINICAL DATA:  Productive cough . EXAM: CHEST  2 VIEW COMPARISON:  CT 05/14/2015.  Chest x-ray 04/18/2015 . FINDINGS: Right IJ dual-lumen catheter stable position. Mediastinum and hilar structures are unremarkable. Bibasilar subsegmental atelectasis and/or infiltrates are again noted. Small left pleural effusion no pneumothorax. Heart size stable. Surgical R and upper abdomen . IMPRESSION: 1. Right IJ dual-lumen catheter stable position. 2.  Mild bibasilar atelectasis and/or infiltrates again noted. Small left pleural effusion. Electronically Signed   By: Marcello Moores  Register   On: 06/02/2015 09:02   US Biopsy  05/31/2015  CLINICAL DATA:  Amyloidosis. EXAM: ULTRASOUND GUIDED core BIOPSY OF left flank fat pad. MEDICATIONS: None. PROCEDURE: The procedure, risks, benefits, and alternatives were explained to the patient. Questions regarding the procedure were encouraged and answered. The patient understands and consents to the procedure. The left flank region was prepped with chlorhexidine in a sterile fashion, and a sterile drape was applied covering the operative field. Sterile gloves were used for the procedure. Local anesthesia was provided with 1% Lidocaine. Under ultrasound guidance, 3 core biopsies were obtained of subcutaneous fat in the left flank region using 18 gauge needle. The samples were placed in formalin vial and delivered to pathology. Appropriate dressing was applied. COMPLICATIONS: None. FINDINGS: None. IMPRESSION: Under ultrasound guidance, core biopsy of left flank fat pad was performed for evaluation of amyloidosis. Electronically Signed   By: Marijo Conception, M.D.   On: 05/31/2015 10:18    ASSESSMENT: Amyloidosis, unclear  if AA or AL subtype.   PLAN:    1. Amyloidosis: Preliminary renal biopsy "does not show evidence for AL amyloid with negative staining for kappa and lambda light chains. Congo red staining, electron microscopy and additional studies to try to identify the molecular origin of the amyloid are also inconclusive."  Fianl pathology indicates "unclassifiable" Amyloid.  Patient has a negative SPEP but does have an elevated kappa light chain of unclear significance. Fat pad biopsy confirmed systemic amyloid. Given the systemic nature of his disease as well as the elevated kappa light chain this is possibly an AL type amyloid therefore he may benefit from chemotherapy. Patient has now agreed to proceed with bone  marrow biopsy in the next several weeks. He will then follow-up in mid January to discuss the results and possible treatment planning.   2. Anemia:  Improving, continue Procrit with dialysis.  3. Leukocytosis: Likely reactive, monitor. 4. End-stage renal disease: Secondary to amyloid. Continue dialysis and treatment per nephrology.     Approximately 30 minutes was spent in discussion of which greater than 50% was consultation.  Patient expressed understanding and was in agreement with this plan. He also understands that He can call clinic at any time with any questions, concerns, or complaints.    Lloyd Huger, MD   06/16/2015 2:02 PM

## 2015-06-16 NOTE — Procedures (Signed)
Interventional Radiology Procedure Note  Procedure: CT guided aspirate and core biopsy of left posterior iliac bone Complications: None Recommendations: - Bedrest supine x 1 hour   - Follow biopsy results  Signed,  Dulcy Fanny. Earleen Newport, DO

## 2015-06-30 ENCOUNTER — Other Ambulatory Visit: Payer: Self-pay | Admitting: Vascular Surgery

## 2015-07-05 ENCOUNTER — Ambulatory Visit
Admission: RE | Admit: 2015-07-05 | Discharge: 2015-07-05 | Disposition: A | Discharge status: Home / Self Care | Payer: BLUE CROSS/BLUE SHIELD | Source: Ambulatory Visit | Attending: Vascular Surgery | Admitting: Vascular Surgery

## 2015-07-05 ENCOUNTER — Encounter
Admission: RE | Admit: 2015-07-05 | Discharge: 2015-07-05 | Disposition: A | Discharge status: Home / Self Care | Payer: BLUE CROSS/BLUE SHIELD | Source: Ambulatory Visit | Attending: Vascular Surgery | Admitting: Vascular Surgery

## 2015-07-05 DIAGNOSIS — Z87891 Personal history of nicotine dependence: Secondary | ICD-10-CM | POA: Diagnosis not present

## 2015-07-05 DIAGNOSIS — Z01818 Encounter for other preprocedural examination: Secondary | ICD-10-CM

## 2015-07-05 DIAGNOSIS — I10 Essential (primary) hypertension: Secondary | ICD-10-CM | POA: Insufficient documentation

## 2015-07-05 LAB — BASIC METABOLIC PANEL
ANION GAP: 6 (ref 5–15)
BUN: 41 mg/dL — ABNORMAL HIGH (ref 6–20)
CALCIUM: 9.6 mg/dL (ref 8.9–10.3)
CO2: 26 mmol/L (ref 22–32)
CREATININE: 3.52 mg/dL — AB (ref 0.61–1.24)
Chloride: 111 mmol/L (ref 101–111)
GFR, EST AFRICAN AMERICAN: 21 mL/min — AB (ref >60)
GFR, EST NON AFRICAN AMERICAN: 18 mL/min — AB (ref >60)
Glucose, Bld: 94 mg/dL (ref 65–99)
Potassium: 4.6 mmol/L (ref 3.5–5.1)
SODIUM: 143 mmol/L (ref 135–145)

## 2015-07-05 LAB — CBC WITH DIFFERENTIAL/PLATELET
BASOS ABS: 0.1 10*3/uL (ref 0–0.1)
BASOS PCT: 1 %
EOS ABS: 0.9 10*3/uL — AB (ref 0–0.7)
Eosinophils Relative: 8 %
HEMATOCRIT: 28.6 % — AB (ref 40.0–52.0)
Hemoglobin: 9.1 g/dL — ABNORMAL LOW (ref 13.0–18.0)
Lymphocytes Relative: 21 %
Lymphs Abs: 2.2 10*3/uL (ref 1.0–3.6)
MCH: 27.8 pg (ref 26.0–34.0)
MCHC: 31.7 g/dL — ABNORMAL LOW (ref 32.0–36.0)
MCV: 87.6 fL (ref 80.0–100.0)
MONOS PCT: 5 %
Monocytes Absolute: 0.6 10*3/uL (ref 0.2–1.0)
NEUTROS ABS: 6.8 10*3/uL — AB (ref 1.4–6.5)
NEUTROS PCT: 65 %
PLATELETS: 406 10*3/uL (ref 150–440)
RBC: 3.27 MIL/uL — ABNORMAL LOW (ref 4.40–5.90)
RDW: 18.3 % — AB (ref 11.5–14.5)
WBC: 10.5 10*3/uL (ref 3.8–10.6)

## 2015-07-05 LAB — PROTIME-INR
INR: 1.09
Prothrombin Time: 14.3 seconds (ref 11.4–15.0)

## 2015-07-05 LAB — TYPE AND SCREEN
ABO/RH(D): O POS
Antibody Screen: NEGATIVE

## 2015-07-05 LAB — APTT: APTT: 30 s (ref 24–36)

## 2015-07-05 NOTE — Patient Instructions (Addendum)
  Your procedure is scheduled on:07/09/15 Report to Day Surgery. To find out your arrival time please call [phone] between 1PM - 3PM on 07/08/15.  Remember: Instructions that are not followed completely may result in serious medical risk, up to and including death, or upon the discretion of your surgeon and anesthesiologist your surgery may need to be rescheduled.    __x__ 1. Do not eat food or drink liquids after midnight. No gum chewing or hard candies.     __x__ 2. No Alcohol for 24 hours before or after surgery.   ____ 3. Bring all medications with you on the day of surgery if instructed.    __x__ 4. Notify your doctor if there is any change in your medical condition     (cold, fever, infections).     Do not wear jewelry, make-up, hairpins, clips or nail polish.  Do not wear lotions, powders, or perfumes. You may wear deodorant.  Do not shave 48 hours prior to surgery. Men may shave face and neck.  Do not bring valuables to the hospital.    [facility] is not responsible for any belongings or valuables.               Contacts, dentures or bridgework may not be worn into surgery.  Leave your suitcase in the car. After surgery it may be brought to your room.  For patients admitted to the hospital, discharge time is determined by your                treatment team.   Patients discharged the day of surgery will not be allowed to drive home.   Please read over the following fact sheets that you were given:   Surgical Site Infection Prevention   ____ Take these medicines the morning of surgery with A SIP OF WATER:    1. protonix the night before and morning of surgery  2. May take pain medication if needed  3.   4.  5.  6.  ____ Fleet Enema (as directed)   __x__ Use CHG Soap as directed  __x__ Use inhalers on the day of surgery  ____ Stop metformin 2 days prior to surgery    ____ Take 1/2 of usual insulin dose the night before surgery and none on the morning of  surgery.   ____ Stop Coumadin/Plavix/aspirin on   __x__ Stop Anti-inflammatories on tylenol only   ____ Stop supplements until after surgery.    ____ Bring C-Pap to the hospital.

## 2015-07-07 ENCOUNTER — Ambulatory Visit (INDEPENDENT_AMBULATORY_CARE_PROVIDER_SITE_OTHER): Payer: BLUE CROSS/BLUE SHIELD | Admitting: *Deleted

## 2015-07-07 DIAGNOSIS — J449 Chronic obstructive pulmonary disease, unspecified: Secondary | ICD-10-CM

## 2015-07-07 LAB — PULMONARY FUNCTION TEST
DL/VA % pred: 54 %
DL/VA: 2.58 ml/min/mmHg/L
DLCO unc % pred: 44 %
DLCO unc: 15.53 ml/min/mmHg
FEF 25-75 Post: 5.1 L/sec
FEF 25-75 Pre: 4.83 L/sec
FEF2575-%CHANGE-POST: 5 %
FEF2575-%PRED-POST: 143 %
FEF2575-%PRED-PRE: 135 %
FEV1-%CHANGE-POST: 1 %
FEV1-%PRED-POST: 86 %
FEV1-%Pred-Pre: 85 %
FEV1-PRE: 3.52 L
FEV1-Post: 3.55 L
FEV1FVC-%CHANGE-POST: 0 %
FEV1FVC-%PRED-PRE: 111 %
FEV6-%Change-Post: 1 %
FEV6-%Pred-Post: 80 %
FEV6-%Pred-Pre: 79 %
FEV6-PRE: 4.1 L
FEV6-Post: 4.17 L
FEV6FVC-%PRED-PRE: 104 %
FEV6FVC-%Pred-Post: 104 %
FVC-%CHANGE-POST: 1 %
FVC-%PRED-POST: 77 %
FVC-%Pred-Pre: 76 %
FVC-Post: 4.17 L
FVC-Pre: 4.1 L
POST FEV1/FVC RATIO: 85 %
PRE FEV6/FVC RATIO: 100 %
Post FEV6/FVC ratio: 100 %
Pre FEV1/FVC ratio: 86 %
RV % PRED: 85 %
RV: 1.86 L
TLC % pred: 82 %
TLC: 6.09 L

## 2015-07-07 NOTE — Progress Notes (Signed)
PFT performed today. 

## 2015-07-07 NOTE — Progress Notes (Signed)
SMW performed today. 

## 2015-07-09 ENCOUNTER — Ambulatory Visit: Payer: BLUE CROSS/BLUE SHIELD | Admitting: *Deleted

## 2015-07-09 ENCOUNTER — Ambulatory Visit
Admission: RE | Admit: 2015-07-09 | Discharge: 2015-07-09 | Disposition: A | Discharge status: Home / Self Care | Payer: BLUE CROSS/BLUE SHIELD | Source: Ambulatory Visit | Attending: Vascular Surgery | Admitting: Vascular Surgery

## 2015-07-09 ENCOUNTER — Encounter
Admission: RE | Disposition: A | Discharge status: Home / Self Care | Payer: Self-pay | Source: Ambulatory Visit | Attending: Vascular Surgery

## 2015-07-09 ENCOUNTER — Encounter: Payer: Self-pay | Admitting: *Deleted

## 2015-07-09 DIAGNOSIS — J449 Chronic obstructive pulmonary disease, unspecified: Secondary | ICD-10-CM | POA: Insufficient documentation

## 2015-07-09 DIAGNOSIS — K219 Gastro-esophageal reflux disease without esophagitis: Secondary | ICD-10-CM | POA: Insufficient documentation

## 2015-07-09 DIAGNOSIS — Z992 Dependence on renal dialysis: Secondary | ICD-10-CM | POA: Insufficient documentation

## 2015-07-09 DIAGNOSIS — Z87891 Personal history of nicotine dependence: Secondary | ICD-10-CM | POA: Insufficient documentation

## 2015-07-09 DIAGNOSIS — Z841 Family history of disorders of kidney and ureter: Secondary | ICD-10-CM | POA: Insufficient documentation

## 2015-07-09 DIAGNOSIS — Z79899 Other long term (current) drug therapy: Secondary | ICD-10-CM | POA: Insufficient documentation

## 2015-07-09 DIAGNOSIS — Z888 Allergy status to other drugs, medicaments and biological substances status: Secondary | ICD-10-CM | POA: Insufficient documentation

## 2015-07-09 DIAGNOSIS — I120 Hypertensive chronic kidney disease with stage 5 chronic kidney disease or end stage renal disease: Secondary | ICD-10-CM | POA: Insufficient documentation

## 2015-07-09 DIAGNOSIS — N186 End stage renal disease: Secondary | ICD-10-CM | POA: Insufficient documentation

## 2015-07-09 DIAGNOSIS — Z7951 Long term (current) use of inhaled steroids: Secondary | ICD-10-CM | POA: Insufficient documentation

## 2015-07-09 HISTORY — DX: Acute embolism and thrombosis of other specified veins: I82.890

## 2015-07-09 HISTORY — PX: CAPD INSERTION: SHX5233

## 2015-07-09 LAB — POCT I-STAT 4, (NA,K, GLUC, HGB,HCT)
Glucose, Bld: 94 mg/dL (ref 65–99)
HCT: 31 % — ABNORMAL LOW (ref 39.0–52.0)
Hemoglobin: 10.5 g/dL — ABNORMAL LOW (ref 13.0–17.0)
Potassium: 3.8 mmol/L (ref 3.5–5.1)
Sodium: 145 mmol/L (ref 135–145)

## 2015-07-09 SURGERY — LAPAROSCOPIC INSERTION CONTINUOUS AMBULATORY PERITONEAL DIALYSIS  (CAPD) CATHETER
Anesthesia: General | Wound class: Clean

## 2015-07-09 MED ORDER — CEFAZOLIN SODIUM-DEXTROSE 2-3 GM-% IV SOLR
INTRAVENOUS | Status: AC
  Administered 2015-07-09: 2 g via INTRAVENOUS
  Filled 2015-07-09: qty 50

## 2015-07-09 MED ORDER — EPHEDRINE SULFATE 50 MG/ML IJ SOLN
INTRAMUSCULAR | Status: DC | PRN
  Administered 2015-07-09: 10 mg via INTRAVENOUS

## 2015-07-09 MED ORDER — SODIUM CHLORIDE 0.9 % IV SOLN
INTRAVENOUS | Status: DC
  Administered 2015-07-09: 50 mL/h via INTRAVENOUS

## 2015-07-09 MED ORDER — ROCURONIUM BROMIDE 100 MG/10ML IV SOLN
INTRAVENOUS | Status: DC | PRN
  Administered 2015-07-09: 40 mg via INTRAVENOUS
  Administered 2015-07-09: 10 mg via INTRAVENOUS

## 2015-07-09 MED ORDER — FENTANYL CITRATE (PF) 100 MCG/2ML IJ SOLN
INTRAMUSCULAR | Status: DC | PRN
  Administered 2015-07-09: 100 ug via INTRAVENOUS

## 2015-07-09 MED ORDER — ONDANSETRON HCL 4 MG/2ML IJ SOLN
4.0000 mg | Freq: Once | INTRAMUSCULAR | Status: AC | PRN
  Administered 2015-07-09: 4 mg via INTRAVENOUS

## 2015-07-09 MED ORDER — DEXAMETHASONE SODIUM PHOSPHATE 10 MG/ML IJ SOLN
INTRAMUSCULAR | Status: DC | PRN
  Administered 2015-07-09: 5 mg via INTRAVENOUS

## 2015-07-09 MED ORDER — PROPOFOL 10 MG/ML IV BOLUS
INTRAVENOUS | Status: DC | PRN
  Administered 2015-07-09: 200 mg via INTRAVENOUS

## 2015-07-09 MED ORDER — BUPIVACAINE-EPINEPHRINE (PF) 0.25% -1:200000 IJ SOLN
INTRAMUSCULAR | Status: AC
  Filled 2015-07-09: qty 30

## 2015-07-09 MED ORDER — HYDROMORPHONE HCL 1 MG/ML IJ SOLN
INTRAMUSCULAR | Status: AC
  Administered 2015-07-09: 0.5 mg via INTRAVENOUS
  Filled 2015-07-09: qty 1

## 2015-07-09 MED ORDER — LIDOCAINE HCL (PF) 1 % IJ SOLN
INTRAMUSCULAR | Status: AC
  Filled 2015-07-09: qty 2

## 2015-07-09 MED ORDER — MIDAZOLAM HCL 2 MG/2ML IJ SOLN
INTRAMUSCULAR | Status: DC | PRN
  Administered 2015-07-09: 2 mg via INTRAVENOUS

## 2015-07-09 MED ORDER — ONDANSETRON HCL 4 MG/2ML IJ SOLN
INTRAMUSCULAR | Status: DC | PRN
  Administered 2015-07-09: 4 mg via INTRAVENOUS

## 2015-07-09 MED ORDER — ONDANSETRON HCL 4 MG/2ML IJ SOLN
INTRAMUSCULAR | Status: AC
  Administered 2015-07-09: 4 mg via INTRAVENOUS
  Filled 2015-07-09: qty 2

## 2015-07-09 MED ORDER — GLYCOPYRROLATE 0.2 MG/ML IJ SOLN
INTRAMUSCULAR | Status: DC | PRN
  Administered 2015-07-09: 0.6 mg via INTRAVENOUS

## 2015-07-09 MED ORDER — HYDROMORPHONE HCL 1 MG/ML IJ SOLN
0.2500 mg | INTRAMUSCULAR | Status: DC | PRN
  Administered 2015-07-09 (×4): 0.5 mg via INTRAVENOUS

## 2015-07-09 MED ORDER — NEOSTIGMINE METHYLSULFATE 10 MG/10ML IV SOLN
INTRAVENOUS | Status: DC | PRN
  Administered 2015-07-09: 4 mg via INTRAVENOUS

## 2015-07-09 SURGICAL SUPPLY — 36 items
ADAPTER CATH DIALYSIS 18.75 (CATHETERS) IMPLANT
ADAPTER CATH DIALYSIS 18.75CM (CATHETERS)
BLADE SURG 15 STRL LF DISP TIS (BLADE) ×1 IMPLANT
BLADE SURG 15 STRL SS (BLADE) ×2
CANISTER SUCT 1200ML W/VALVE (MISCELLANEOUS) ×3 IMPLANT
CATH DIALYSIS TI ADAPTER (ADAPTER) ×3 IMPLANT
CATH DLYS SWAN NECK 62.5CM (CATHETERS) IMPLANT
CHLORAPREP W/TINT 26ML (MISCELLANEOUS) ×3 IMPLANT
DIALYSIS CATH PD EXTEND (CATHETERS) ×3 IMPLANT
DIALYSIS CATH PD INTRO KIT (CATHETERS) ×3 IMPLANT
GAUZE SPONGE 4X4 12PLY STRL (GAUZE/BANDAGES/DRESSINGS) ×3 IMPLANT
GLOVE SURG SYN 8.0 (GLOVE) ×15 IMPLANT
GOWN STRL REUS W/ TWL LRG LVL3 (GOWN DISPOSABLE) ×1 IMPLANT
GOWN STRL REUS W/ TWL XL LVL3 (GOWN DISPOSABLE) ×2 IMPLANT
GOWN STRL REUS W/TWL LRG LVL3 (GOWN DISPOSABLE) ×2
GOWN STRL REUS W/TWL XL LVL3 (GOWN DISPOSABLE) ×4
KIT RM TURNOVER STRD PROC AR (KITS) ×3 IMPLANT
LABEL OR SOLS (LABEL) ×3 IMPLANT
LIQUID BAND (GAUZE/BANDAGES/DRESSINGS) ×3 IMPLANT
MINICAP W/POVIDONE IODINE SOL (MISCELLANEOUS) ×3 IMPLANT
NEEDLE HYPO 25X1 1.5 SAFETY (NEEDLE) ×3 IMPLANT
NEEDLE INSUFFLATION 150MM (ENDOMECHANICALS) ×3 IMPLANT
NS IRRIG 500ML POUR BTL (IV SOLUTION) ×3 IMPLANT
PACK LAP CHOLECYSTECTOMY (MISCELLANEOUS) ×3 IMPLANT
PAD GROUND ADULT SPLIT (MISCELLANEOUS) ×3 IMPLANT
PENCIL ELECTRO HAND CTR (MISCELLANEOUS) ×3 IMPLANT
SET TRANSFER 6 W/TWIST CLAMP 5 (SET/KITS/TRAYS/PACK) IMPLANT
SLEEVE ENDOPATH XCEL 5M (ENDOMECHANICALS) IMPLANT
SUT ETHIBOND 0 36 GRN (SUTURE) ×3 IMPLANT
SUT MNCRL AB 4-0 PS2 18 (SUTURE) ×3 IMPLANT
SUT VIC AB 3-0 SH 27 (SUTURE) ×2
SUT VIC AB 3-0 SH 27X BRD (SUTURE) ×1 IMPLANT
SUT VICRYL 0 AB UR-6 (SUTURE) ×6 IMPLANT
SYR 50ML LL SCALE MARK (SYRINGE) ×3 IMPLANT
TROCAR XCEL NON-BLD 5MMX100MML (ENDOMECHANICALS) ×3 IMPLANT
TUBING INSUFFLATOR HI FLOW (MISCELLANEOUS) ×3 IMPLANT

## 2015-07-09 NOTE — Anesthesia Preprocedure Evaluation (Signed)
Anesthesia Evaluation  Patient identified by MRN, date of birth, ID band Patient awake    Reviewed: Allergy & Precautions, NPO status   Airway Mallampati: I  TM Distance: >3 FB Neck ROM: Full    Dental  (+) Teeth Intact   Pulmonary COPD,  COPD inhaler, former smoker,    Pulmonary exam normal        Cardiovascular Exercise Tolerance: Good hypertension, Normal cardiovascular exam  HTN, not on any meds--is improving with dialysis.   Neuro/Psych    GI/Hepatic GERD  Medicated and Controlled,  Endo/Other    Renal/GU ESRF and DialysisRenal disease     Musculoskeletal   Abdominal (+)  Abdomen: soft.    Peds  Hematology Hb 9.1.   Anesthesia Other Findings   Reproductive/Obstetrics                             Anesthesia Physical Anesthesia Plan  ASA: IV  Anesthesia Plan: General   Post-op Pain Management:    Induction: Intravenous  Airway Management Planned: Oral ETT  Additional Equipment:   Intra-op Plan:   Post-operative Plan: Extubation in OR  Informed Consent: I have reviewed the patients History and Physical, chart, labs and discussed the procedure including the risks, benefits and alternatives for the proposed anesthesia with the patient or authorized representative who has indicated his/her understanding and acceptance.   Dental advisory given (Discussed risk (small) of dental damage.)  Plan Discussed with: CRNA  Anesthesia Plan Comments:         Anesthesia Quick Evaluation

## 2015-07-09 NOTE — Op Note (Signed)
OPERATIVE NOTE   PROCEDURE: 1. Laparoscopic peritoneal dialysis catheter placement.  PRE-OPERATIVE DIAGNOSIS: 1. End stage renal disease   POST-OPERATIVE DIAGNOSIS: Same  SURGEON: Schnier, [name]  ASSISTANT(S): Mr. Shea Stakes   ANESTHESIA: general  ESTIMATED BLOOD LOSS: Minimal   FINDING(S): 1. None  SPECIMEN(S): None  INDICATIONS:  Joseph [name] is a 50 y.o. male who presents with end stage renal disease. The patient has decided to do peritoneal dialysis for his long-term dialysis. Risks and benefits of placement were discussed and he is agreeable to proceed.  Differences between peritoneal dialysis and hemodialysis were discussed.    DESCRIPTION:  After obtaining full informed written consent, the patient was brought back to the operating room and placed supine upon the operating table. The patient received IV antibiotics prior to induction. After obtaining adequate anesthesia, the abdomen was prepped and draped in the standard fashion. After performing the appropriate timeout the templates are laid upon the abdomen in the proper fashion and the incision sites an exit site is marked with a surgical marker.  Small transverse incision is made in the left upper quadrant and the dissection is carried down to expose the fascia. The fascia is then elevated with a bone hook Varies needle is introduced and a saline test was performed indicating intraperitoneal location and insufflated of the abdomen with carbon dioxide is performed to 15 mmHg pressure. A 5 mm trocar is then placed again maintaining elevation of the fascia with a bone hook.  Evaluation of the abdomen shows some scattered adhesions on the right hand side however left lower quadrant is absolutely free of any visible abnormalities or adhesions.  An incision is then made in the left lower quadrant to the lateral portion of the mid rectus muscle the dissection is carried down through the soft tissues and the  fascia is exposed. Small incision is made in the fascia with the Bovie and a pursestring suture of 0 Vicryl is placed.   Introducer needle was then inserted under direct visualization. The peritoneal Dialysis catheter is then introduced under direct visualization positioned in the left lower quadrant. The fascia is then spread slightly and the cuff is placed below the fascia and the pursestring suture of 0 Vicryl is used to secure this. The trochars then removed. Proper measurements were performed and the connector is then applied and secured with an 0 Vicryl suture. Subsequently the catheter is now pulled from the right lower quadrant incision to the left lower quadrant incision to the left upper quadrant incision taking care to advanced the tunneler along the fascia. A curved tunneler is then used 2 complete the tunnel and the catheters pulled out the exit site.  The appropriate distal connectors were placed, and I then placed 300 cc of saline through the catheter into the pelvis. The abdomen was desufflated. Immediately, 200 cc of effluent returned through the catheter when the bag was placed to gravity. I took one more look with the camera to ensure that the catheter was in the pelvis and it was. The hasson trocar was then removed. I then closed the incisions with a 2-0 Vicryl in the right lower quadrant incision and subsequently 3-0 Vicryl and 4-0 Monocryl, the other incisions were closed with 3-0 Vicryl and 4-0 Monocryl and placed Dermabond as dressing. Dry dressing was placed around the catheter exit site. The patient was then awakened from anesthesia and taken to the recovery room in stable condition having tolerated the procedure well.   COMPLICATIONS: None  CONDITION:  None  Katha Cabal 07/09/2015, 1:31 PM

## 2015-07-09 NOTE — Anesthesia Procedure Notes (Signed)
Procedure Name: Intubation Date/Time: 07/09/2015 12:07 PM Performed by: Kennon Holter Pre-anesthesia Checklist: Timeout performed, Patient being monitored, Suction available, Emergency Drugs available and Patient identified Patient Re-evaluated:Patient Re-evaluated prior to inductionOxygen Delivery Method: Circle system utilized Preoxygenation: Pre-oxygenation with 100% oxygen Intubation Type: IV induction Ventilation: Mask ventilation without difficulty Laryngoscope Size: Miller and 2 Grade View: Grade I Tube type: Oral Tube size: 7.5 mm Airway Equipment and Method: Patient positioned with wedge pillow and Stylet Placement Confirmation: positive ETCO2,  breath sounds checked- equal and bilateral and ETT inserted through vocal cords under direct vision Secured at: 21 cm Dental Injury: Teeth and Oropharynx as per pre-operative assessment

## 2015-07-09 NOTE — H&P (Signed)
[facility] History & Physical Update  The patient was interviewed and re-examined.  The patient's previous History and Physical has been reviewed and is unchanged.  There is no change in the plan of care. We plan to proceed with the scheduled procedure.  Schnier, [name], MD  07/09/2015, 11:46 AM

## 2015-07-09 NOTE — Progress Notes (Signed)
Pt reports he had nausea and took one of his Zofran 4mg  Sublingual tab.  Pt reports his nausea is some better.  IV disconnected for pt to dress.

## 2015-07-09 NOTE — Discharge Instructions (Signed)

## 2015-07-09 NOTE — Anesthesia Postprocedure Evaluation (Signed)
Anesthesia Post Note  Patient: Joseph [name]  Procedure(s) Performed: Procedure(s) (LRB): LAPAROSCOPIC INSERTION CONTINUOUS AMBULATORY PERITONEAL DIALYSIS  (CAPD) CATHETER (N/A)  Patient location during evaluation: PACU Anesthesia Type: General Level of consciousness: awake and awake and alert Pain management: pain level controlled Vital Signs Assessment: post-procedure vital signs reviewed and stable Respiratory status: spontaneous breathing Cardiovascular status: blood pressure returned to baseline Postop Assessment: no headache Anesthetic complications: no    Last Vitals:  Filed Vitals:   07/09/15 1351 07/09/15 1403  BP: 121/87   Pulse: 91 93  Temp:    Resp: 8 8    Last Pain:  Filed Vitals:   07/09/15 1403  PainSc: 6                  Namita Yearwood M

## 2015-07-09 NOTE — Transfer of Care (Signed)
Immediate Anesthesia Transfer of Care Note  Patient: Joseph [name]  Procedure(s) Performed: Procedure(s): LAPAROSCOPIC INSERTION CONTINUOUS AMBULATORY PERITONEAL DIALYSIS  (CAPD) CATHETER (N/A)  Patient Location: PACU  Anesthesia Type:General  Level of Consciousness: awake, alert  and oriented  Airway & Oxygen Therapy: Patient Spontanous Breathing and Patient connected to nasal cannula oxygen  Post-op Assessment: Report given to RN and Post -op Vital signs reviewed and stable  Post vital signs: Reviewed and stable  Last Vitals:  Filed Vitals:   07/09/15 0939  BP: 142/97  Pulse: 90  Temp: 36.3 C  Resp: 18    Complications: No apparent anesthesia complications

## 2015-07-12 ENCOUNTER — Ambulatory Visit (INDEPENDENT_AMBULATORY_CARE_PROVIDER_SITE_OTHER): Payer: BLUE CROSS/BLUE SHIELD | Admitting: Internal Medicine

## 2015-07-12 ENCOUNTER — Encounter: Payer: Self-pay | Admitting: Internal Medicine

## 2015-07-12 VITALS — BP 134/76 | HR 102 | Ht 72.0 in | Wt 181.6 lb

## 2015-07-12 DIAGNOSIS — R0602 Shortness of breath: Secondary | ICD-10-CM

## 2015-07-12 NOTE — Progress Notes (Signed)
[facility] Consultation     Date: 07/12/2015,   MRN# [id number] Joseph [name] 1965/10/06 Code Status:  Hosp day:@LENGTHOFSTAYDAYS @ Referring MD: @ATDPROV @     PCP:      AdmissionWeight: 181 lb 9.6 oz (82.373 kg)                 CurrentWeight: 181 lb 9.6 oz (82.373 kg)  Synopsis:  Dx of Amyloidosis ESRD on HD with chronic cough -patient with previous h/o hemorrhagic left sided effusion with h/o heavy smoking PFT's: 07/07/15 ratio 86% FEV1 85%, 6MWT WNL DLCO 44%   CC: follow up SOB, cough HPI Cough and SOB have resolved, uses albuterol as needed  dialysis patient with Amyloidosis s/p PD catheter placement Smoked 1.5 ppd for 30 years CXR reveiwedon 07/05/15 reviewed on  07/12/2015  No leg swelling  Noted  PFTS reviewed with patient       MEDICATIONS    Home Medication:  Current Outpatient Rx  Name  Route  Sig  Dispense  Refill  . albuterol (PROVENTIL HFA;VENTOLIN HFA) 108 (90 BASE) MCG/ACT inhaler   Inhalation   Inhale 2 puffs into the lungs every 6 (six) hours as needed for wheezing or shortness of breath.   1 Inhaler   2   . cycloSPORINE (RESTASIS) 0.05 % ophthalmic emulsion   Both Eyes   Place 1 drop into both eyes 2 (two) times daily.         . Fluticasone Furoate-Vilanterol 100-25 MCG/INH AEPB   Inhalation   Inhale 1 puff into the lungs daily.   60 each   11   . ondansetron (ZOFRAN-ODT) 4 MG disintegrating tablet   Oral   Take 1 tablet (4 mg total) by mouth every 8 (eight) hours as needed for nausea or vomiting.   30 tablet   0   . oxyCODONE-acetaminophen (PERCOCET/ROXICET) 5-325 MG tablet   Oral   Take 1 tablet by mouth every 4 (four) hours as needed for severe pain.         . pantoprazole (PROTONIX) 40 MG tablet   Oral   Take 1 tablet (40 mg total) by mouth 2 (two) times daily before a meal.   60 tablet   0     Current Medication:   Current outpatient prescriptions:  .  albuterol (PROVENTIL HFA;VENTOLIN HFA) 108 (90  BASE) MCG/ACT inhaler, Inhale 2 puffs into the lungs every 6 (six) hours as needed for wheezing or shortness of breath., Disp: 1 Inhaler, Rfl: 2 .  cycloSPORINE (RESTASIS) 0.05 % ophthalmic emulsion, Place 1 drop into both eyes 2 (two) times daily., Disp: , Rfl:  .  Fluticasone Furoate-Vilanterol 100-25 MCG/INH AEPB, Inhale 1 puff into the lungs daily., Disp: 60 each, Rfl: 11 .  ondansetron (ZOFRAN-ODT) 4 MG disintegrating tablet, Take 1 tablet (4 mg total) by mouth every 8 (eight) hours as needed for nausea or vomiting., Disp: 30 tablet, Rfl: 0 .  oxyCODONE-acetaminophen (PERCOCET/ROXICET) 5-325 MG tablet, Take 1 tablet by mouth every 4 (four) hours as needed for severe pain., Disp: , Rfl:  .  pantoprazole (PROTONIX) 40 MG tablet, Take 1 tablet (40 mg total) by mouth 2 (two) times daily before a meal., Disp: 60 tablet, Rfl: 0     ALLERGIES   Ciprofloxacin hcl; Doxycycline hyclate; and Phenol-glycerin     REVIEW OF SYSTEMS   Review of Systems  Constitutional: Negative for fever, chills, weight loss and malaise/fatigue.  Respiratory: Negative for cough, shortness of breath and wheezing.  Cardiovascular: Negative for chest pain, orthopnea and leg swelling.  Gastrointestinal: Negative for nausea and abdominal pain.     VS: BP 134/76 mmHg  Pulse 102  Ht 6' (1.829 m)  Wt 181 lb 9.6 oz (82.373 kg)  BMI 24.62 kg/m2  SpO2 96%     PHYSICAL EXAM   Physical Exam  Constitutional: No distress.  Cardiovascular: Normal rate, regular rhythm and normal heart sounds.   No murmur heard. Pulmonary/Chest: Effort normal and breath sounds normal. No respiratory distress. He has no wheezes.  Abdominal: Soft. Bowel sounds are normal.  Surgical scars clean intact, PD catheter in place  Skin: He is not diaphoretic.         LABS    Recent Labs     07/09/15  1028  HGB  10.5*  HCT  31.0*  GLUCOSE  94  ,       ASSESSMENT/PLAN   50 yo white male with new Dx of Amyloidosis ESRD on  HD with chronic cough-resolved with h/o heavy smoking findings c/w very mild COPD based on flow volume loops with slight concavity of end exp flow limitation  1.albuterol as needed 2.avoid second hand smokle 3.cough suppressants as needed   Follow up in 3 months   I have personally obtained a history, examined the patient, evaluated laboratory and independently reviewed imaging results,  The Patient requires high complexity decision making for assessment and support, frequent evaluation and titration of therapies, application of advanced monitoring technologies and extensive interpretation of multiple databases. Patient/Family are satisfied with Plan of action and management. All questions answered   Corrin Parker, M.D.  [facility]  Medical Director [place] Director [facility] Cardio-Pulmonary Department

## 2015-07-12 NOTE — Patient Instructions (Signed)

## 2015-07-13 DIAGNOSIS — H3581 Retinal edema: Secondary | ICD-10-CM | POA: Insufficient documentation

## 2015-07-14 ENCOUNTER — Inpatient Hospital Stay: Payer: BLUE CROSS/BLUE SHIELD | Attending: Oncology | Admitting: Oncology

## 2015-07-14 VITALS — BP 150/100

## 2015-07-14 DIAGNOSIS — H35719 Central serous chorioretinopathy, unspecified eye: Secondary | ICD-10-CM | POA: Insufficient documentation

## 2015-07-14 DIAGNOSIS — D649 Anemia, unspecified: Secondary | ICD-10-CM | POA: Diagnosis not present

## 2015-07-14 DIAGNOSIS — I120 Hypertensive chronic kidney disease with stage 5 chronic kidney disease or end stage renal disease: Secondary | ICD-10-CM | POA: Diagnosis not present

## 2015-07-14 DIAGNOSIS — Z992 Dependence on renal dialysis: Secondary | ICD-10-CM | POA: Insufficient documentation

## 2015-07-14 DIAGNOSIS — Z79899 Other long term (current) drug therapy: Secondary | ICD-10-CM | POA: Insufficient documentation

## 2015-07-14 DIAGNOSIS — E859 Amyloidosis, unspecified: Secondary | ICD-10-CM

## 2015-07-14 DIAGNOSIS — Z8701 Personal history of pneumonia (recurrent): Secondary | ICD-10-CM | POA: Insufficient documentation

## 2015-07-14 DIAGNOSIS — N186 End stage renal disease: Secondary | ICD-10-CM | POA: Diagnosis not present

## 2015-07-14 DIAGNOSIS — Z87891 Personal history of nicotine dependence: Secondary | ICD-10-CM | POA: Insufficient documentation

## 2015-07-14 DIAGNOSIS — K219 Gastro-esophageal reflux disease without esophagitis: Secondary | ICD-10-CM | POA: Insufficient documentation

## 2015-07-14 DIAGNOSIS — N2889 Other specified disorders of kidney and ureter: Secondary | ICD-10-CM | POA: Diagnosis not present

## 2015-07-18 NOTE — Progress Notes (Signed)
Joseph [name]  [phone]) [phone] [phone]) [phone]  ID: Morad Tal OB: Feb 18, 1966  MR#: [id number]  SPQ#:[id number]  Patient Care Team: Marjean Donna, MD as PCP - General (Family Medicine)  CHIEF COMPLAINT:  Chief Complaint  Patient presents with  . Follow-up    INTERVAL HISTORY: Patient returns to clinic today for further evaluation and discussion of his bone marrow biopsy results. He currently feels well and is asymptomatic.  He no longer is losing weight. He is tolerating dialysis well and is going to start PD soon. He has no neurologic complaints. He denies any fevers. He denies any chest pain or shortness of breath. He has no abdominal pain. He denies any nausea, vomiting, constipation, or diarrhea. Patient offers no further specific complaints today.  REVIEW OF SYSTEMS:   Review of Systems  Constitutional: Negative for fever, weight loss and malaise/fatigue.  Respiratory: Negative.  Negative for shortness of breath.   Cardiovascular: Negative.  Negative for chest pain.  Gastrointestinal: Negative.  Negative for abdominal pain.  Genitourinary: Negative.   Musculoskeletal: Negative.   Neurological: Negative.  Negative for weakness.    As per HPI. Otherwise, a complete review of systems is negatve.  PAST MEDICAL HISTORY: Past Medical History  Diagnosis Date  . Allergy   . Central serous chorioretinopathy   . Renal insufficiency   . GERD (gastroesophageal reflux disease)   . Hypertension     improved since starting dialysis  . Pneumonia     as child  . Blood dyscrasia     amyloidosis  . Heart murmur     never followed up with this, no problems  . Blood clot in abdominal vein     happened around age 11 due to traumatic injury    PAST SURGICAL HISTORY: Past Surgical History  Procedure Laterality Date  . Renal biopsy, percutaneous  04/06/2015       . Peripheral vascular catheterization Left 04/11/2015    Procedure: Embolization;   Surgeon: Katha Cabal, MD;  Location: [facility];  Service: Cardiovascular;  Laterality: Left;  . Aortogram Bilateral 04/11/2015    Procedure: Aortogram;  Surgeon: Katha Cabal, MD;  Location: [facility];  Service: Cardiovascular;  Laterality: Bilateral;  . Peripheral vascular catheterization N/A 04/16/2015    Procedure: Dialysis/Perma Catheter Insertion;  Surgeon: Katha Cabal, MD;  Location: [facility];  Service: Cardiovascular;  Laterality: N/A;  . Tonsillectomy    . Implantation / placement permanent epidural catheter Right 2016  . Exploratory laparotomy      done to find and remove abdominal blood clot as a teenager  . Capd insertion N/A 07/09/2015    Procedure: LAPAROSCOPIC INSERTION CONTINUOUS AMBULATORY PERITONEAL DIALYSIS  (CAPD) CATHETER;  Surgeon: Katha Cabal, MD;  Location: [facility];  Service: Vascular;  Laterality: N/A;    FAMILY HISTORY Family History  Problem Relation Age of Onset  . Hypertension Other        ADVANCED DIRECTIVES:    HEALTH MAINTENANCE: Social History  Substance Use Topics  . Smoking status: Former Smoker    Quit date: 11/04/2006  . Smokeless tobacco: Never Used  . Alcohol Use: No     Colonoscopy:  PAP:  Bone density:  Lipid panel:  Allergies  Allergen Reactions  . Ciprofloxacin Hcl Swelling    High fever  . Doxycycline Hyclate Swelling  . Phenol-Glycerin Swelling    Current Outpatient Prescriptions  Medication Sig Dispense Refill  . albuterol (PROVENTIL HFA;VENTOLIN HFA) 108 (  90 BASE) MCG/ACT inhaler Inhale 2 puffs into the lungs every 6 (six) hours as needed for wheezing or shortness of breath. 1 Inhaler 2  . cycloSPORINE (RESTASIS) 0.05 % ophthalmic emulsion Place 1 drop into both eyes 2 (two) times daily.    . Fluticasone Furoate-Vilanterol 100-25 MCG/INH AEPB Inhale 1 puff into the lungs daily. 60 each 11  . ondansetron (ZOFRAN-ODT) 4 MG disintegrating tablet Take 1 tablet (4 mg  total) by mouth every 8 (eight) hours as needed for nausea or vomiting. 30 tablet 0  . oxyCODONE-acetaminophen (PERCOCET/ROXICET) 5-325 MG tablet Take 1 tablet by mouth every 4 (four) hours as needed for severe pain.    . pantoprazole (PROTONIX) 40 MG tablet Take 1 tablet (40 mg total) by mouth 2 (two) times daily before a meal. 60 tablet 0   No current facility-administered medications for this visit.    OBJECTIVE: Filed Vitals:   07/14/15 1118  BP: 150/100     There is no weight on file to calculate BMI.    ECOG FS:0 - Asymptomatic  General: Well-developed, well-nourished, no acute distress. Eyes: Pink conjunctiva, anicteric sclera. Lungs: Clear to auscultation bilaterally. Heart: Regular rate and rhythm. No rubs, murmurs, or gallops. Abdomen: Soft, nontender, nondistended. No organomegaly noted, normoactive bowel sounds. Musculoskeletal: No edema, cyanosis, or clubbing. Neuro: Alert, answering all questions appropriately. Cranial nerves grossly intact. Skin: No rashes or petechiae noted. Psych: Normal affect.   LAB RESULTS:  Lab Results  Component Value Date   NA 145 07/09/2015   K 3.8 07/09/2015   CL 111 07/05/2015   CO2 26 07/05/2015   GLUCOSE 94 07/09/2015   BUN 41* 07/05/2015   CREATININE 3.52* 07/05/2015   CALCIUM 9.6 07/05/2015   PROT 5.4* 04/12/2015   ALBUMIN 2.5* 04/21/2015   AST 32 04/12/2015   ALT 119* 04/12/2015   ALKPHOS 195* 04/12/2015   BILITOT 4.1* 04/12/2015   GFRNONAA 18* 07/05/2015   GFRAA 21* 07/05/2015    Lab Results  Component Value Date   WBC 10.5 07/05/2015   NEUTROABS 6.8* 07/05/2015   HGB 10.5* 07/09/2015   HCT 31.0* 07/09/2015   MCV 87.6 07/05/2015   PLT 406 07/05/2015     STUDIES: Chest 2 View  07/05/2015  CLINICAL DATA:  Preoperative evaluation for catheter placement, history hypertension, renal insufficiency, former smoker EXAM: CHEST  2 VIEW COMPARISON:  06/02/2015 FINDINGS: RIGHT jugular dual-lumen central venous catheter  with tip projecting over mid SVC. Upper normal heart size. Mediastinal contours and pulmonary vascularity normal. Minimal lingular scarring stable. Lungs otherwise clear. No pleural effusion or pneumothorax. Bones unremarkable. Embolization coils noted in the LEFT upper quadrant. IMPRESSION: Lingular scarring. No acute abnormalities. Electronically Signed   By: Lavonia Dana M.D.   On: 07/05/2015 09:06    ASSESSMENT: Amyloidosis, unclear if AA or AL subtype.   PLAN:    1. Amyloidosis: Preliminary renal biopsy "does not show evidence for AL amyloid with negative staining for kappa and lambda light chains. Congo red staining, electron microscopy and additional studies to try to identify the molecular origin of the amyloid are also inconclusive."  Fianl pathology indicates "unclassifiable" Amyloid.  Patient has a negative SPEP but does have an elevated kappa light chain of unclear significance. Fat pad biopsy confirmed systemic amyloid. Bone marrow biopsy had less than 5% plasma cells and only focal deposits of amyloid.  After lengthy discussion with the patient, it was determined not to pursue systemic chemotherapy at this time.  Patient expressed understanding that he  will likely need systemic treatment at some point in the future. Will continue to follow kappa chains closely.  Return to clinic in 3 months for repeat laboratory work and further evaluation 2. Anemia:  Improving, continue Procrit with dialysis.  3. Leukocytosis: Resolved, monitor. 4. End-stage renal disease: Secondary to amyloid. Continue dialysis and treatment per nephrology.     Approximately 30 minutes was spent in discussion of which greater than 50% was consultation.  Patient expressed understanding and was in agreement with this plan. He also understands that He can call clinic at any time with any questions, concerns, or complaints.    Lloyd Huger, MD   07/18/2015 8:07 AM

## 2015-07-27 DIAGNOSIS — Z992 Dependence on renal dialysis: Secondary | ICD-10-CM | POA: Diagnosis not present

## 2015-07-27 DIAGNOSIS — N186 End stage renal disease: Secondary | ICD-10-CM | POA: Diagnosis not present

## 2015-07-29 DIAGNOSIS — N2581 Secondary hyperparathyroidism of renal origin: Secondary | ICD-10-CM | POA: Diagnosis not present

## 2015-07-29 DIAGNOSIS — N186 End stage renal disease: Secondary | ICD-10-CM | POA: Diagnosis not present

## 2015-07-29 DIAGNOSIS — D509 Iron deficiency anemia, unspecified: Secondary | ICD-10-CM | POA: Diagnosis not present

## 2015-07-29 DIAGNOSIS — Z992 Dependence on renal dialysis: Secondary | ICD-10-CM | POA: Diagnosis not present

## 2015-07-29 DIAGNOSIS — D631 Anemia in chronic kidney disease: Secondary | ICD-10-CM | POA: Diagnosis not present

## 2015-07-31 DIAGNOSIS — N2581 Secondary hyperparathyroidism of renal origin: Secondary | ICD-10-CM | POA: Diagnosis not present

## 2015-07-31 DIAGNOSIS — D631 Anemia in chronic kidney disease: Secondary | ICD-10-CM | POA: Diagnosis not present

## 2015-07-31 DIAGNOSIS — N186 End stage renal disease: Secondary | ICD-10-CM | POA: Diagnosis not present

## 2015-07-31 DIAGNOSIS — Z992 Dependence on renal dialysis: Secondary | ICD-10-CM | POA: Diagnosis not present

## 2015-07-31 DIAGNOSIS — D509 Iron deficiency anemia, unspecified: Secondary | ICD-10-CM | POA: Diagnosis not present

## 2015-08-03 DIAGNOSIS — N186 End stage renal disease: Secondary | ICD-10-CM | POA: Diagnosis not present

## 2015-08-03 DIAGNOSIS — D509 Iron deficiency anemia, unspecified: Secondary | ICD-10-CM | POA: Diagnosis not present

## 2015-08-03 DIAGNOSIS — N2581 Secondary hyperparathyroidism of renal origin: Secondary | ICD-10-CM | POA: Diagnosis not present

## 2015-08-03 DIAGNOSIS — Z992 Dependence on renal dialysis: Secondary | ICD-10-CM | POA: Diagnosis not present

## 2015-08-03 DIAGNOSIS — D631 Anemia in chronic kidney disease: Secondary | ICD-10-CM | POA: Diagnosis not present

## 2015-08-05 DIAGNOSIS — Z992 Dependence on renal dialysis: Secondary | ICD-10-CM | POA: Diagnosis not present

## 2015-08-05 DIAGNOSIS — D509 Iron deficiency anemia, unspecified: Secondary | ICD-10-CM | POA: Diagnosis not present

## 2015-08-05 DIAGNOSIS — N186 End stage renal disease: Secondary | ICD-10-CM | POA: Diagnosis not present

## 2015-08-05 DIAGNOSIS — D631 Anemia in chronic kidney disease: Secondary | ICD-10-CM | POA: Diagnosis not present

## 2015-08-05 DIAGNOSIS — N2581 Secondary hyperparathyroidism of renal origin: Secondary | ICD-10-CM | POA: Diagnosis not present

## 2015-08-07 DIAGNOSIS — D509 Iron deficiency anemia, unspecified: Secondary | ICD-10-CM | POA: Diagnosis not present

## 2015-08-07 DIAGNOSIS — N2581 Secondary hyperparathyroidism of renal origin: Secondary | ICD-10-CM | POA: Diagnosis not present

## 2015-08-07 DIAGNOSIS — D631 Anemia in chronic kidney disease: Secondary | ICD-10-CM | POA: Diagnosis not present

## 2015-08-07 DIAGNOSIS — Z992 Dependence on renal dialysis: Secondary | ICD-10-CM | POA: Diagnosis not present

## 2015-08-07 DIAGNOSIS — N186 End stage renal disease: Secondary | ICD-10-CM | POA: Diagnosis not present

## 2015-08-09 DIAGNOSIS — N186 End stage renal disease: Secondary | ICD-10-CM | POA: Diagnosis not present

## 2015-08-09 DIAGNOSIS — Z992 Dependence on renal dialysis: Secondary | ICD-10-CM | POA: Diagnosis not present

## 2015-08-10 ENCOUNTER — Other Ambulatory Visit: Payer: Self-pay | Admitting: Oncology

## 2015-08-10 DIAGNOSIS — N08 Glomerular disorders in diseases classified elsewhere: Secondary | ICD-10-CM

## 2015-08-10 DIAGNOSIS — Z992 Dependence on renal dialysis: Secondary | ICD-10-CM | POA: Diagnosis not present

## 2015-08-10 DIAGNOSIS — N186 End stage renal disease: Secondary | ICD-10-CM | POA: Diagnosis not present

## 2015-08-10 DIAGNOSIS — E859 Amyloidosis, unspecified: Secondary | ICD-10-CM

## 2015-08-10 DIAGNOSIS — E854 Organ-limited amyloidosis: Secondary | ICD-10-CM | POA: Insufficient documentation

## 2015-08-11 DIAGNOSIS — Z992 Dependence on renal dialysis: Secondary | ICD-10-CM | POA: Diagnosis not present

## 2015-08-11 DIAGNOSIS — N186 End stage renal disease: Secondary | ICD-10-CM | POA: Diagnosis not present

## 2015-08-12 DIAGNOSIS — N186 End stage renal disease: Secondary | ICD-10-CM | POA: Diagnosis not present

## 2015-08-12 DIAGNOSIS — Z992 Dependence on renal dialysis: Secondary | ICD-10-CM | POA: Diagnosis not present

## 2015-08-13 DIAGNOSIS — Z992 Dependence on renal dialysis: Secondary | ICD-10-CM | POA: Diagnosis not present

## 2015-08-13 DIAGNOSIS — N186 End stage renal disease: Secondary | ICD-10-CM | POA: Diagnosis not present

## 2015-08-16 DIAGNOSIS — Z992 Dependence on renal dialysis: Secondary | ICD-10-CM | POA: Diagnosis not present

## 2015-08-16 DIAGNOSIS — N186 End stage renal disease: Secondary | ICD-10-CM | POA: Diagnosis not present

## 2015-08-17 DIAGNOSIS — Z992 Dependence on renal dialysis: Secondary | ICD-10-CM | POA: Diagnosis not present

## 2015-08-17 DIAGNOSIS — N186 End stage renal disease: Secondary | ICD-10-CM | POA: Diagnosis not present

## 2015-08-20 DIAGNOSIS — Z992 Dependence on renal dialysis: Secondary | ICD-10-CM | POA: Diagnosis not present

## 2015-08-20 DIAGNOSIS — N186 End stage renal disease: Secondary | ICD-10-CM | POA: Diagnosis not present

## 2015-08-23 ENCOUNTER — Other Ambulatory Visit: Payer: Self-pay | Admitting: Vascular Surgery

## 2015-08-23 DIAGNOSIS — Z992 Dependence on renal dialysis: Secondary | ICD-10-CM | POA: Diagnosis not present

## 2015-08-23 DIAGNOSIS — N186 End stage renal disease: Secondary | ICD-10-CM | POA: Diagnosis not present

## 2015-08-24 ENCOUNTER — Encounter
Admission: RE | Disposition: A | Discharge status: Home / Self Care | Payer: Self-pay | Source: Ambulatory Visit | Attending: Vascular Surgery

## 2015-08-24 ENCOUNTER — Ambulatory Visit
Admission: RE | Admit: 2015-08-24 | Discharge: 2015-08-24 | Disposition: A | Discharge status: Home / Self Care | Payer: 59 | Source: Ambulatory Visit | Attending: Vascular Surgery | Admitting: Vascular Surgery

## 2015-08-24 ENCOUNTER — Encounter: Payer: Self-pay | Admitting: Vascular Surgery

## 2015-08-24 DIAGNOSIS — Z881 Allergy status to other antibiotic agents status: Secondary | ICD-10-CM | POA: Insufficient documentation

## 2015-08-24 DIAGNOSIS — R011 Cardiac murmur, unspecified: Secondary | ICD-10-CM | POA: Insufficient documentation

## 2015-08-24 DIAGNOSIS — I120 Hypertensive chronic kidney disease with stage 5 chronic kidney disease or end stage renal disease: Secondary | ICD-10-CM | POA: Insufficient documentation

## 2015-08-24 DIAGNOSIS — T829XXA Unspecified complication of cardiac and vascular prosthetic device, implant and graft, initial encounter: Secondary | ICD-10-CM | POA: Insufficient documentation

## 2015-08-24 DIAGNOSIS — N184 Chronic kidney disease, stage 4 (severe): Secondary | ICD-10-CM | POA: Diagnosis not present

## 2015-08-24 DIAGNOSIS — Z888 Allergy status to other drugs, medicaments and biological substances status: Secondary | ICD-10-CM | POA: Diagnosis not present

## 2015-08-24 DIAGNOSIS — Z992 Dependence on renal dialysis: Secondary | ICD-10-CM | POA: Diagnosis not present

## 2015-08-24 DIAGNOSIS — Z79899 Other long term (current) drug therapy: Secondary | ICD-10-CM | POA: Diagnosis not present

## 2015-08-24 DIAGNOSIS — N186 End stage renal disease: Secondary | ICD-10-CM | POA: Diagnosis not present

## 2015-08-24 DIAGNOSIS — Y841 Kidney dialysis as the cause of abnormal reaction of the patient, or of later complication, without mention of misadventure at the time of the procedure: Secondary | ICD-10-CM | POA: Diagnosis not present

## 2015-08-24 DIAGNOSIS — Z9889 Other specified postprocedural states: Secondary | ICD-10-CM | POA: Insufficient documentation

## 2015-08-24 DIAGNOSIS — N2889 Other specified disorders of kidney and ureter: Secondary | ICD-10-CM | POA: Diagnosis not present

## 2015-08-24 DIAGNOSIS — E859 Amyloidosis, unspecified: Secondary | ICD-10-CM | POA: Diagnosis not present

## 2015-08-24 HISTORY — PX: PERIPHERAL VASCULAR CATHETERIZATION: SHX172C

## 2015-08-24 SURGERY — DIALYSIS/PERMA CATHETER REMOVAL
Anesthesia: Moderate Sedation

## 2015-08-24 MED ORDER — LIDOCAINE-EPINEPHRINE (PF) 1 %-1:200000 IJ SOLN
INTRAMUSCULAR | Status: DC | PRN
  Administered 2015-08-24: 20 mL via INTRADERMAL

## 2015-08-24 MED ORDER — HYDROMORPHONE HCL 1 MG/ML IJ SOLN: 1.0000 mg | Freq: Once | INTRAMUSCULAR | Status: DC

## 2015-08-24 MED ORDER — ONDANSETRON HCL 4 MG/2ML IJ SOLN: 4.0000 mg | Freq: Four times a day (QID) | INTRAMUSCULAR | Status: DC | PRN

## 2015-08-24 SURGICAL SUPPLY — 6 items
DURAPREP 26ML APPLICATOR (WOUND CARE) ×3 IMPLANT
FCP FG STRG 5.5XNS LF DISP (INSTRUMENTS) ×1
FORCEPS FG STRG 5.5XNS LF DISP (INSTRUMENTS) ×1 IMPLANT
FORCEPS KELLY 5.5 STR (INSTRUMENTS) ×2
PREP CHG 10.5 TEAL (MISCELLANEOUS) ×6 IMPLANT
TRAY LACERAT/PLASTIC (MISCELLANEOUS) ×3 IMPLANT

## 2015-08-24 NOTE — Discharge Instructions (Signed)

## 2015-08-24 NOTE — Op Note (Signed)
  OPERATIVE NOTE   PROCEDURE: 1. Removal of a right IJ tunneled dialysis catheter  PRE-OPERATIVE DIAGNOSIS: Complication of dialysis catheter, End stage renal disease  POST-OPERATIVE DIAGNOSIS: Same  SURGEON: Meher Kucinski, [name], M.D.  ANESTHESIA: Local anesthetic with 1% lidocaine with epinephrine   ESTIMATED BLOOD LOSS: Minimal   FINDING(S): 1. Catheter intact   SPECIMEN(S):  Catheter  INDICATIONS:   Joseph [name] is a 50 y.o. male who presents with functioning peritoneal dialysis catheter.  The patient has undergone placement of PD catheter which is working and this has been successfully cannulated without difficulty.  therefore is undergoing removal of his tunneled catheter which is no longer needed to avoid septic complications.   DESCRIPTION: After obtaining full informed written consent, the patient was positioned supine. The right IJ catheter and surrounding area is prepped and draped in a sterile fashion. The cuff was localized by palpation and noted to be less than 3 cm from the exit site. After appropriate timeout is called, 1% lidocaine with epinephrine is infiltrated into the surrounding tissues around the cuff. Small transverse incision is created at the exit site with an 11 blade scalpel and the dissection was carried up along the catheter to expose the cuff of the tunneled catheter.  The catheter cuff is then freed from the surrounding attachments and adhesions. Once the catheter has been freed circumferentially it is removed in 1 piece. Light pressure was held at the base of the neck.   Antibiotic ointment and a sterile dressing is applied to the exit site. Patient tolerated procedure well and there were no complications.  COMPLICATIONS: None  CONDITION: Unchanged  Keerthana Vanrossum, [name], M.D. [facility] Office: [phone]  08/24/2015,12:53 PM

## 2015-08-25 DIAGNOSIS — N186 End stage renal disease: Secondary | ICD-10-CM | POA: Diagnosis not present

## 2015-08-25 DIAGNOSIS — Z992 Dependence on renal dialysis: Secondary | ICD-10-CM | POA: Diagnosis not present

## 2015-08-26 DIAGNOSIS — Z992 Dependence on renal dialysis: Secondary | ICD-10-CM | POA: Diagnosis not present

## 2015-08-26 DIAGNOSIS — N186 End stage renal disease: Secondary | ICD-10-CM | POA: Diagnosis not present

## 2015-08-27 DIAGNOSIS — N186 End stage renal disease: Secondary | ICD-10-CM | POA: Diagnosis not present

## 2015-08-27 DIAGNOSIS — Z992 Dependence on renal dialysis: Secondary | ICD-10-CM | POA: Diagnosis not present

## 2015-08-28 DIAGNOSIS — Z992 Dependence on renal dialysis: Secondary | ICD-10-CM | POA: Diagnosis not present

## 2015-08-28 DIAGNOSIS — N186 End stage renal disease: Secondary | ICD-10-CM | POA: Diagnosis not present

## 2015-08-29 DIAGNOSIS — Z992 Dependence on renal dialysis: Secondary | ICD-10-CM | POA: Diagnosis not present

## 2015-08-29 DIAGNOSIS — N186 End stage renal disease: Secondary | ICD-10-CM | POA: Diagnosis not present

## 2015-08-30 DIAGNOSIS — N186 End stage renal disease: Secondary | ICD-10-CM | POA: Diagnosis not present

## 2015-08-30 DIAGNOSIS — Z992 Dependence on renal dialysis: Secondary | ICD-10-CM | POA: Diagnosis not present

## 2015-08-31 DIAGNOSIS — H3581 Retinal edema: Secondary | ICD-10-CM | POA: Diagnosis not present

## 2015-08-31 DIAGNOSIS — H35713 Central serous chorioretinopathy, bilateral: Secondary | ICD-10-CM | POA: Diagnosis not present

## 2015-08-31 DIAGNOSIS — N186 End stage renal disease: Secondary | ICD-10-CM | POA: Diagnosis not present

## 2015-08-31 DIAGNOSIS — H04123 Dry eye syndrome of bilateral lacrimal glands: Secondary | ICD-10-CM | POA: Diagnosis not present

## 2015-08-31 DIAGNOSIS — H353231 Exudative age-related macular degeneration, bilateral, with active choroidal neovascularization: Secondary | ICD-10-CM | POA: Diagnosis not present

## 2015-08-31 DIAGNOSIS — Z992 Dependence on renal dialysis: Secondary | ICD-10-CM | POA: Diagnosis not present

## 2015-08-31 DIAGNOSIS — H2513 Age-related nuclear cataract, bilateral: Secondary | ICD-10-CM | POA: Diagnosis not present

## 2015-09-01 DIAGNOSIS — Z5181 Encounter for therapeutic drug level monitoring: Secondary | ICD-10-CM | POA: Diagnosis not present

## 2015-09-01 DIAGNOSIS — Z992 Dependence on renal dialysis: Secondary | ICD-10-CM | POA: Diagnosis not present

## 2015-09-01 DIAGNOSIS — D631 Anemia in chronic kidney disease: Secondary | ICD-10-CM | POA: Diagnosis not present

## 2015-09-01 DIAGNOSIS — N186 End stage renal disease: Secondary | ICD-10-CM | POA: Diagnosis not present

## 2015-09-01 DIAGNOSIS — H30033 Focal chorioretinal inflammation, peripheral, bilateral: Secondary | ICD-10-CM | POA: Diagnosis not present

## 2015-09-02 DIAGNOSIS — N186 End stage renal disease: Secondary | ICD-10-CM | POA: Diagnosis not present

## 2015-09-02 DIAGNOSIS — Z992 Dependence on renal dialysis: Secondary | ICD-10-CM | POA: Diagnosis not present

## 2015-09-03 DIAGNOSIS — Z7682 Awaiting organ transplant status: Secondary | ICD-10-CM | POA: Diagnosis not present

## 2015-09-03 DIAGNOSIS — Z992 Dependence on renal dialysis: Secondary | ICD-10-CM | POA: Diagnosis not present

## 2015-09-03 DIAGNOSIS — H35713 Central serous chorioretinopathy, bilateral: Secondary | ICD-10-CM | POA: Diagnosis not present

## 2015-09-03 DIAGNOSIS — Z87891 Personal history of nicotine dependence: Secondary | ICD-10-CM | POA: Diagnosis not present

## 2015-09-03 DIAGNOSIS — E859 Amyloidosis, unspecified: Secondary | ICD-10-CM | POA: Diagnosis not present

## 2015-09-03 DIAGNOSIS — H353231 Exudative age-related macular degeneration, bilateral, with active choroidal neovascularization: Secondary | ICD-10-CM | POA: Diagnosis not present

## 2015-09-03 DIAGNOSIS — H04123 Dry eye syndrome of bilateral lacrimal glands: Secondary | ICD-10-CM | POA: Diagnosis not present

## 2015-09-03 DIAGNOSIS — Z125 Encounter for screening for malignant neoplasm of prostate: Secondary | ICD-10-CM | POA: Diagnosis not present

## 2015-09-03 DIAGNOSIS — N186 End stage renal disease: Secondary | ICD-10-CM | POA: Diagnosis not present

## 2015-09-04 DIAGNOSIS — N186 End stage renal disease: Secondary | ICD-10-CM | POA: Diagnosis not present

## 2015-09-04 DIAGNOSIS — Z992 Dependence on renal dialysis: Secondary | ICD-10-CM | POA: Diagnosis not present

## 2015-09-05 DIAGNOSIS — Z992 Dependence on renal dialysis: Secondary | ICD-10-CM | POA: Diagnosis not present

## 2015-09-05 DIAGNOSIS — N186 End stage renal disease: Secondary | ICD-10-CM | POA: Diagnosis not present

## 2015-09-06 DIAGNOSIS — Z992 Dependence on renal dialysis: Secondary | ICD-10-CM | POA: Diagnosis not present

## 2015-09-06 DIAGNOSIS — N186 End stage renal disease: Secondary | ICD-10-CM | POA: Diagnosis not present

## 2015-09-07 DIAGNOSIS — Z992 Dependence on renal dialysis: Secondary | ICD-10-CM | POA: Diagnosis not present

## 2015-09-07 DIAGNOSIS — N186 End stage renal disease: Secondary | ICD-10-CM | POA: Diagnosis not present

## 2015-09-08 DIAGNOSIS — N186 End stage renal disease: Secondary | ICD-10-CM | POA: Diagnosis not present

## 2015-09-08 DIAGNOSIS — Z992 Dependence on renal dialysis: Secondary | ICD-10-CM | POA: Diagnosis not present

## 2015-09-09 DIAGNOSIS — Z992 Dependence on renal dialysis: Secondary | ICD-10-CM | POA: Diagnosis not present

## 2015-09-09 DIAGNOSIS — N186 End stage renal disease: Secondary | ICD-10-CM | POA: Diagnosis not present

## 2015-09-10 ENCOUNTER — Encounter: Payer: Self-pay | Admitting: Family Medicine

## 2015-09-10 ENCOUNTER — Ambulatory Visit (INDEPENDENT_AMBULATORY_CARE_PROVIDER_SITE_OTHER): Payer: 59 | Admitting: Family Medicine

## 2015-09-10 VITALS — BP 118/70 | HR 72 | Temp 97.9°F | Resp 12 | Ht 72.0 in | Wt 181.0 lb

## 2015-09-10 DIAGNOSIS — J449 Chronic obstructive pulmonary disease, unspecified: Secondary | ICD-10-CM

## 2015-09-10 DIAGNOSIS — H35713 Central serous chorioretinopathy, bilateral: Secondary | ICD-10-CM

## 2015-09-10 DIAGNOSIS — Z789 Other specified health status: Secondary | ICD-10-CM

## 2015-09-10 DIAGNOSIS — K219 Gastro-esophageal reflux disease without esophagitis: Secondary | ICD-10-CM

## 2015-09-10 DIAGNOSIS — N186 End stage renal disease: Secondary | ICD-10-CM | POA: Diagnosis not present

## 2015-09-10 DIAGNOSIS — Z1211 Encounter for screening for malignant neoplasm of colon: Secondary | ICD-10-CM

## 2015-09-10 DIAGNOSIS — E859 Amyloidosis, unspecified: Secondary | ICD-10-CM | POA: Diagnosis not present

## 2015-09-10 DIAGNOSIS — I10 Essential (primary) hypertension: Secondary | ICD-10-CM

## 2015-09-10 DIAGNOSIS — Z992 Dependence on renal dialysis: Secondary | ICD-10-CM | POA: Diagnosis not present

## 2015-09-10 DIAGNOSIS — D631 Anemia in chronic kidney disease: Secondary | ICD-10-CM | POA: Diagnosis not present

## 2015-09-10 DIAGNOSIS — E785 Hyperlipidemia, unspecified: Secondary | ICD-10-CM | POA: Diagnosis not present

## 2015-09-10 MED ORDER — AMOXICILLIN-POT CLAVULANATE 500-125 MG PO TABS: 2.0000 | ORAL_TABLET | Freq: Every day | ORAL | Status: DC

## 2015-09-10 NOTE — Progress Notes (Signed)
Patient ID: Joseph [name], male   DOB: Feb 06, 1966, 50 y.o.   MRN: [id number]   Subjective:    Patient ID: Joseph [name], male    DOB: 09/17/1965, 50 y.o.   MRN: [id number]  Patient presents for [facility] Patient here to establish care. His previous primary care provider was Dr. Emilee Hero. As a significant past male with history. He is at chronic kidney disease for quite some time. He was seen by his retina specialist at that time Dr.Kurup who diagnosed him with central serous in bilateral eyes. His nephrologist is Dr.Lefief who wanted to get to the bottom of his chronic kidney disease therefore he was hospitalized last October for a biopsy he actually had significant complications with bleeding and had to embolize his left kidney nor to shut it down. His right kidney also failed and he was only about 15% therefore he went on dialysis. He is still on peritoneal dialysis. The biopsy results show problematic in the low doses. Since then he has been worked up by multiple specialists for this amyloidosis. He's had bone marrow biopsy as well as fat biopsy. He is currently trying to go on the transplant list and was seen by [facility] recently. They are requesting that he have a stress test and a colonoscopy. They also looking for any cardiac involvement. He is not sure if he has had an echocardiogram in the past. He does not have a current cardiologist.  He is also have a history of smoking and mild COPD. He follows with Dr. Mortimer Fries , who initially had him on Breo and albuterol but he is not using either one of these.  He is now on peritoneal dialysis and they did also note that he needs updated hepatitis B vaccines. His dialysis center is [facility]  Does have dry eye syndrome ophthalmologist name Dr. Derl Barrow [facility]   Oncology- Dr. Grayland O[facility]ond [facility]  He has history of hyperlipidemia he was on cholesterol medication but this was  discontinued after he had elevation of his liver function tests. He has not had any follow-up since last year on this.  Review Of Systems:  GEN- denies fatigue, fever, weight loss,weakness, recent illness HEENT- denies eye drainage, change in vision, nasal discharge, CVS- denies chest pain, palpitations RESP- denies SOB, cough, wheeze ABD- denies N/V, change in stools, abd pain GU- denies dysuria, hematuria, dribbling, incontinence MSK- denies joint pain, muscle aches, injury Neuro- denies headache, dizziness, syncope, seizure activity       Objective:    BP 118/70 mmHg  Pulse 72  Temp(Src) 97.9 F (36.6 C) (Oral)  Resp 12  Ht 6' (1.829 m)  Wt 181 lb (82.101 kg)  BMI 24.54 kg/m2 GEN- NAD, alert and oriented x3 HEENT- PERRL, EOMI, non injected sclera, pink conjunctiva, MMM, oropharynx clear Neck- Supple, no thyromegaly CVS- RRR, no murmur RESP-CTAB ABD-NABS,soft,NT,ND- dialysis cath Psych- very pleasant EXT- No edema Pulses- Radial - 2+        Assessment & Plan:      Problem List Items Addressed This Visit    Hyperlipidemia - Primary    Elevated LFT in past, he is ESRD so will take this into account No current meds      Relevant Orders   Lipid panel (Completed)   Hepatitis B vaccination not up to date    We will get Hep B vaccine started       GERD (gastroesophageal reflux disease)  Continue protonix      Essential hypertension    In setting of renal disease continue cozaar      Relevant Orders   Ambulatory referral to Cardiology   ESRD on peritoneal dialysis [facility])   COPD (chronic obstructive pulmonary disease) (HCC)    Mild COPD, no longer using any inhalers, has f/u pulmonary in April      Central serous chorioretinopathy of both eyes    F/U Optho next week to see if MTX to be restarted       Amyloidosis [facility])    Very interesting complex history, I will get him set up to have Stress testing and colonoscopy done, as this is needed for this  tranplant pre-recs. He did have Echo done in 2016 which showed mild diastolic dysfunction otherwise normal  I will follow along and provide resources locally as needed during his continued work up for his amyloiodis and ESRD       Relevant Orders   Ambulatory referral to Cardiology    Other Visit Diagnoses    Colon cancer screening        Relevant Orders    Ambulatory referral to Gastroenterology       Note: This dictation was prepared with Dragon dictation along with smaller phrase technology. Any transcriptional errors that result from this process are unintentional.

## 2015-09-10 NOTE — Patient Instructions (Addendum)
Referral to colonoscopy Referral for stress testing  Hepatitis B vaccine given We will call about cholesterol Release of records- Dr. Emilee Hero

## 2015-09-11 DIAGNOSIS — Z992 Dependence on renal dialysis: Secondary | ICD-10-CM | POA: Diagnosis not present

## 2015-09-11 DIAGNOSIS — N186 End stage renal disease: Secondary | ICD-10-CM | POA: Diagnosis not present

## 2015-09-11 LAB — LIPID PANEL
CHOLESTEROL: 311 mg/dL — AB (ref 125–200)
HDL: 31 mg/dL — ABNORMAL LOW (ref >=40)
LDL CALC: 233 mg/dL — AB (ref <130)
Total CHOL/HDL Ratio: 10 Ratio — ABNORMAL HIGH (ref <=5.0)
Triglycerides: 233 mg/dL — ABNORMAL HIGH (ref <150)
VLDL: 47 mg/dL — AB (ref <30)

## 2015-09-12 ENCOUNTER — Encounter: Payer: Self-pay | Admitting: Family Medicine

## 2015-09-12 DIAGNOSIS — E785 Hyperlipidemia, unspecified: Secondary | ICD-10-CM | POA: Insufficient documentation

## 2015-09-12 DIAGNOSIS — K219 Gastro-esophageal reflux disease without esophagitis: Secondary | ICD-10-CM | POA: Insufficient documentation

## 2015-09-12 DIAGNOSIS — N186 End stage renal disease: Secondary | ICD-10-CM | POA: Diagnosis not present

## 2015-09-12 DIAGNOSIS — Z992 Dependence on renal dialysis: Secondary | ICD-10-CM | POA: Diagnosis not present

## 2015-09-12 DIAGNOSIS — Z789 Other specified health status: Secondary | ICD-10-CM | POA: Insufficient documentation

## 2015-09-12 DIAGNOSIS — Z2839 Other underimmunization status: Secondary | ICD-10-CM | POA: Insufficient documentation

## 2015-09-12 DIAGNOSIS — H35713 Central serous chorioretinopathy, bilateral: Secondary | ICD-10-CM | POA: Insufficient documentation

## 2015-09-12 NOTE — Assessment & Plan Note (Signed)
Mild COPD, no longer using any inhalers, has f/u pulmonary in April

## 2015-09-12 NOTE — Assessment & Plan Note (Signed)
In setting of renal disease continue cozaar

## 2015-09-12 NOTE — Assessment & Plan Note (Signed)
F/U Optho next week to see if MTX to be restarted

## 2015-09-12 NOTE — Assessment & Plan Note (Signed)
We will get Hep B vaccine started

## 2015-09-12 NOTE — Assessment & Plan Note (Signed)
Very interesting complex history, I will get him set up to have Stress testing and colonoscopy done, as this is needed for this tranplant pre-recs. He did have Echo done in 2016 which showed mild diastolic dysfunction otherwise normal  I will follow along and provide resources locally as needed during his continued work up for his amyloiodis and ESRD

## 2015-09-12 NOTE — Assessment & Plan Note (Signed)
Elevated LFT in past, he is ESRD so will take this into account No current meds

## 2015-09-12 NOTE — Assessment & Plan Note (Signed)
Continue protonix  

## 2015-09-13 DIAGNOSIS — Z992 Dependence on renal dialysis: Secondary | ICD-10-CM | POA: Diagnosis not present

## 2015-09-13 DIAGNOSIS — N186 End stage renal disease: Secondary | ICD-10-CM | POA: Diagnosis not present

## 2015-09-14 ENCOUNTER — Ambulatory Visit (INDEPENDENT_AMBULATORY_CARE_PROVIDER_SITE_OTHER): Payer: 59 | Admitting: *Deleted

## 2015-09-14 ENCOUNTER — Other Ambulatory Visit: Payer: Self-pay | Admitting: *Deleted

## 2015-09-14 DIAGNOSIS — H04123 Dry eye syndrome of bilateral lacrimal glands: Secondary | ICD-10-CM | POA: Diagnosis not present

## 2015-09-14 DIAGNOSIS — Z992 Dependence on renal dialysis: Secondary | ICD-10-CM | POA: Diagnosis not present

## 2015-09-14 DIAGNOSIS — H353231 Exudative age-related macular degeneration, bilateral, with active choroidal neovascularization: Secondary | ICD-10-CM | POA: Diagnosis not present

## 2015-09-14 DIAGNOSIS — E875 Hyperkalemia: Secondary | ICD-10-CM | POA: Diagnosis not present

## 2015-09-14 DIAGNOSIS — Z23 Encounter for immunization: Secondary | ICD-10-CM

## 2015-09-14 DIAGNOSIS — H2513 Age-related nuclear cataract, bilateral: Secondary | ICD-10-CM | POA: Diagnosis not present

## 2015-09-14 DIAGNOSIS — N186 End stage renal disease: Secondary | ICD-10-CM | POA: Diagnosis not present

## 2015-09-14 MED ORDER — ATORVASTATIN CALCIUM 20 MG PO TABS: 20.0000 mg | ORAL_TABLET | Freq: Every day | ORAL | Status: DC

## 2015-09-15 DIAGNOSIS — N186 End stage renal disease: Secondary | ICD-10-CM | POA: Diagnosis not present

## 2015-09-15 DIAGNOSIS — Z992 Dependence on renal dialysis: Secondary | ICD-10-CM | POA: Diagnosis not present

## 2015-09-16 DIAGNOSIS — Z992 Dependence on renal dialysis: Secondary | ICD-10-CM | POA: Diagnosis not present

## 2015-09-16 DIAGNOSIS — N186 End stage renal disease: Secondary | ICD-10-CM | POA: Diagnosis not present

## 2015-09-17 DIAGNOSIS — N186 End stage renal disease: Secondary | ICD-10-CM | POA: Diagnosis not present

## 2015-09-17 DIAGNOSIS — Z992 Dependence on renal dialysis: Secondary | ICD-10-CM | POA: Diagnosis not present

## 2015-09-18 DIAGNOSIS — N186 End stage renal disease: Secondary | ICD-10-CM | POA: Diagnosis not present

## 2015-09-18 DIAGNOSIS — Z992 Dependence on renal dialysis: Secondary | ICD-10-CM | POA: Diagnosis not present

## 2015-09-19 DIAGNOSIS — Z992 Dependence on renal dialysis: Secondary | ICD-10-CM | POA: Diagnosis not present

## 2015-09-19 DIAGNOSIS — N186 End stage renal disease: Secondary | ICD-10-CM | POA: Diagnosis not present

## 2015-09-20 DIAGNOSIS — H0289 Other specified disorders of eyelid: Secondary | ICD-10-CM | POA: Diagnosis not present

## 2015-09-20 DIAGNOSIS — H04123 Dry eye syndrome of bilateral lacrimal glands: Secondary | ICD-10-CM | POA: Diagnosis not present

## 2015-09-20 DIAGNOSIS — Z992 Dependence on renal dialysis: Secondary | ICD-10-CM | POA: Diagnosis not present

## 2015-09-20 DIAGNOSIS — N186 End stage renal disease: Secondary | ICD-10-CM | POA: Diagnosis not present

## 2015-09-21 DIAGNOSIS — N186 End stage renal disease: Secondary | ICD-10-CM | POA: Diagnosis not present

## 2015-09-21 DIAGNOSIS — Z992 Dependence on renal dialysis: Secondary | ICD-10-CM | POA: Diagnosis not present

## 2015-09-22 DIAGNOSIS — N186 End stage renal disease: Secondary | ICD-10-CM | POA: Diagnosis not present

## 2015-09-22 DIAGNOSIS — Z992 Dependence on renal dialysis: Secondary | ICD-10-CM | POA: Diagnosis not present

## 2015-09-23 ENCOUNTER — Ambulatory Visit (INDEPENDENT_AMBULATORY_CARE_PROVIDER_SITE_OTHER): Payer: Medicare Other | Admitting: Cardiology

## 2015-09-23 ENCOUNTER — Encounter: Payer: Self-pay | Admitting: Cardiology

## 2015-09-23 VITALS — BP 110/84 | HR 64 | Ht 72.0 in | Wt 183.2 lb

## 2015-09-23 DIAGNOSIS — E785 Hyperlipidemia, unspecified: Secondary | ICD-10-CM | POA: Diagnosis not present

## 2015-09-23 DIAGNOSIS — Z992 Dependence on renal dialysis: Secondary | ICD-10-CM | POA: Diagnosis not present

## 2015-09-23 DIAGNOSIS — Z01818 Encounter for other preprocedural examination: Secondary | ICD-10-CM | POA: Diagnosis not present

## 2015-09-23 DIAGNOSIS — N186 End stage renal disease: Secondary | ICD-10-CM | POA: Diagnosis not present

## 2015-09-23 DIAGNOSIS — I10 Essential (primary) hypertension: Secondary | ICD-10-CM | POA: Diagnosis not present

## 2015-09-23 NOTE — Progress Notes (Signed)
Cardiology Office Note   Date:  09/23/2015   ID:  Joseph [name], DOB 14-Jul-1965, MRN [id number]  Referring Doctor:  Vic Blackbird, MD   Cardiologist:   Wende Bushy, MD   Reason for consultation:  Chief Complaint  Patient presents with  . other    Pt needing kidney transplant wanting to check Heart condition due to requirements. Meds reviewed verbally with pt.      History of Present Illness: Joseph [name] is a 50 y.o. male who presents for  Preoperative evaluation,  Prior to evaluation for kidney transplant , at the request of PCP.   Patient has a diagnosis of end-stage renal disease, currently on peritoneal dialysis. They believe it is secondary to amyloidosis.   Patient does not report any history of chest pain, shortness of breath. He is able to exercise by walking and biking. No symptoms with these activities. No palpitations or no loss of consciousness.   patient denies headache, fever, cough, colds, don pain, PND, orthopnea, edema.  ROS:  Please see the history of present illness. Aside from mentioned under HPI, all other systems are reviewed and negative.     Past Medical History  Diagnosis Date  . Central serous chorioretinopathy   . GERD (gastroesophageal reflux disease)   . Hypertension     improved since starting dialysis  . Pneumonia     as child  . Blood dyscrasia     amyloidosis  . Heart murmur     never followed up with this, no problems  . Blood clot in abdominal vein     happened around age 53 due to traumatic injury  . Dry eye   . Renal insufficiency   . Hyperlipidemia     Past Surgical History  Procedure Laterality Date  . Renal biopsy, percutaneous  04/06/2015       . Peripheral vascular catheterization Left 04/11/2015    Procedure: Embolization;  Surgeon: Katha Cabal, MD;  Location: [place] [facility];  Service: Cardiovascular;  Laterality: Left;  . Aortogram Bilateral 04/11/2015    Procedure: Aortogram;  Surgeon: Katha Cabal, MD;  Location: [place] [facility];  Service: Cardiovascular;  Laterality: Bilateral;  . Peripheral vascular catheterization N/A 04/16/2015    Procedure: Dialysis/Perma Catheter Insertion;  Surgeon: Katha Cabal, MD;  Location: [place] [facility];  Service: Cardiovascular;  Laterality: N/A;  . Tonsillectomy    . Implantation / placement permanent epidural catheter Right 2016  . Exploratory laparotomy      done to find and remove abdominal blood clot as a teenager  . Capd insertion N/A 07/09/2015    Procedure: LAPAROSCOPIC INSERTION CONTINUOUS AMBULATORY PERITONEAL DIALYSIS  (CAPD) CATHETER;  Surgeon: Katha Cabal, MD;  Location: [facility];  Service: Vascular;  Laterality: N/A;  . Peripheral vascular catheterization N/A 08/24/2015    Procedure: Dialysis/Perma Catheter Removal;  Surgeon: Katha Cabal, MD;  Location: [place] [facility];  Service: Cardiovascular;  Laterality: N/A;     reports that he quit smoking about 8 years ago. His smoking use included Cigarettes. He has a 10 pack-year smoking history. He has never used smokeless tobacco. He reports that he does not drink alcohol or use illicit drugs.   family history includes Birth defects in his sister; Early death in his maternal uncle; Heart attack in his maternal uncle and maternal uncle; Heart disease in his maternal uncle and maternal uncle; Hypertension in his mother and other; Multiple sclerosis in his sister.  Maternal uncle died at age 105s,  Thought to be from MI. Another maternal uncle died of massive MI in his 54s to 80s.   Current Outpatient Prescriptions  Medication Sig Dispense Refill  . amoxicillin-clavulanate (AUGMENTIN) 500-125 MG tablet Take 2 tablets (1,000 mg total) by mouth daily. (Patient taking differently: Take 2 tablets by mouth daily as needed. ) 2 tablet 0  . atorvastatin (LIPITOR) 20 MG tablet Take 1 tablet (20 mg total) by mouth daily. 90 tablet 3  . cycloSPORINE (RESTASIS) 0.05 %  ophthalmic emulsion Place 1 drop into both eyes daily.     . folic acid (FOLVITE) 1 MG tablet Take 1 mg by mouth daily.    Marland Kitchen losartan (COZAAR) 50 MG tablet Take 100 mg by mouth daily.     . methotrexate (RHEUMATREX) 10 MG tablet Take 10 mg by mouth once a week. Caution: Chemotherapy. Protect from light.    . ondansetron (ZOFRAN-ODT) 4 MG disintegrating tablet Take 1 tablet (4 mg total) by mouth every 8 (eight) hours as needed for nausea or vomiting. 30 tablet 0  . pantoprazole (PROTONIX) 40 MG tablet Take 1 tablet (40 mg total) by mouth 2 (two) times daily before a meal. (Patient taking differently: Take 40 mg by mouth daily. ) 60 tablet 0   No current facility-administered medications for this visit.    Allergies: Ciprofloxacin hcl; Doxycycline hyclate; and Phenol-glycerin    PHYSICAL EXAM: VS:  BP 110/84 mmHg  Pulse 64  Ht 6' (1.829 m)  Wt 183 lb 4 oz (83.122 kg)  BMI 24.85 kg/m2 , Body mass index is 24.85 kg/(m^2). Wt Readings from Last 3 Encounters:  09/23/15 183 lb 4 oz (83.122 kg)  09/10/15 181 lb (82.101 kg)  07/12/15 181 lb 9.6 oz (82.373 kg)    GENERAL:  well developed, well nourished, not in acute distress HEENT: normocephalic, pink conjunctivae, anicteric sclerae, no xanthelasma, normal dentition, oropharynx clear NECK:  no neck vein engorgement, JVP normal, no hepatojugular reflux, carotid upstroke brisk and symmetric, no bruit, no thyromegaly, no lymphadenopathy LUNGS:  good respiratory effort, clear to auscultation bilaterally CV:  PMI not displaced, no thrills, no lifts, S1 and S2 within normal limits, no palpable S3 or S4, no murmurs, no rubs, no gallops ABD:  Soft, nontender, nondistended, normoactive bowel sounds, no abdominal aortic bruit, no hepatomegaly, no splenomegaly MS: nontender back, no kyphosis, no scoliosis, no joint deformities EXT:  2+ DP/PT pulses, no edema, no varicosities, no cyanosis, no clubbing SKIN: warm, nondiaphoretic, normal turgor, no  ulcers NEUROPSYCH: alert, oriented to person, place, and time, sensory/motor grossly intact, normal mood, appropriate affect  Recent Labs: 04/12/2015: ALT 119* 04/21/2015: Magnesium 1.7 07/05/2015: BUN 41*; Creatinine, Ser 3.52*; Platelets 406 07/09/2015: Hemoglobin 10.5*; Potassium 3.8; Sodium 145   Lipid Panel    Component Value Date/Time   CHOL 311* 09/10/2015 0952   TRIG 233* 09/10/2015 0952   HDL 31* 09/10/2015 0952   CHOLHDL 10.0* 09/10/2015 0952   VLDL 47* 09/10/2015 0952   LDLCALC 233* 09/10/2015 [id number]     Other studies Reviewed:  EKG:  EKG Is ordered today. The ekg from 09/23/2015 was personally reviewed by me and it revealed sinus rhythm, 64 BPM, low voltage QRS.  Additional studies/ records that were reviewed personally reviewed by me today include:  Echo 04/13/2015: Study Conclusions  - Left ventricle: The cavity size was normal. Systolic function was  vigorous. The estimated ejection fraction was 75%. Wall motion  was normal; there were no regional wall  motion abnormalities.  Doppler parameters are consistent with abnormal left ventricular  relaxation (grade 1 diastolic dysfunction). - Aortic valve: Valve area (Vmax): 4.34 cm^2. - Left atrium: The atrium was mildly dilated. - Atrial septum: No defect or patent foramen ovale was identified.  ASSESSMENT AND PLAN:   cardiac evaluation prior to consideration for renal transplantation  hypertension  hyperlipidemia  history of possible premature coronary artery disease in the family   In terms of cardiac evaluation prior to possible renal transplantation, recommend exercise nuclear stress testing. Echocardiogram was done sometime this year. We will need to obtain report. His echocardiogram from October 2016 showed normal systolic function. No evidence of hypertrophy. Left atrium was mildly dilated.   Hypertension BP is well controlled. Continue monitoring BP. Continue current medical therapy and lifestyle  changes.   hyperlipidemia  Lipid levels from 09/10/2015  Were done while patient was not in any statin therapy. Since then, PCP has started him back on atorvastatin 20 mg by mouth daily at bedtime. Patient reports that previously he was taking Crestor and likely the dose was 20 mg. Most likely,  Patient will need a higher dose of Lipitor or might need to go back on Crestor to bring his LDL down to less than 100 ideally. It appears that the PCP has been monitoring his lipid levels. Most likely, he will need a repeat lipid panel and LFTs in about 8-12 weeks since initiation of statin therapy.   in terms of amyloidosis, patient being followed by hematology , as well as nephrology. Traditionally, a cardiac MRI is  Diagnostic modality of choice to evaluate for cardiac amyloidosis. However this is not recommended in end-stage renal disease. I suppose, patient will be further evaluated by transplant team at [facility].  Other options may be RV biopsy or FDG PET.  Current medicines are reviewed at length with the patient today.  The patient does not have concerns regarding medicines.  Labs/ tests ordered today include:  Orders Placed This Encounter  Procedures  . NM Myocar Multi W/Spect W/Wall Motion / EF  . EKG 12-Lead    I had a lengthy and detailed discussion with the patient regarding diagnoses, prognosis, diagnostic options, treatment options.   I counseled the patient on importance of lifestyle modification including heart healthy diet, regular physical activity .   Disposition:   FU with undersigned after tests   Signed, Wende Bushy, MD  09/23/2015 11:18 AM    [place]

## 2015-09-23 NOTE — Patient Instructions (Addendum)
Medication Instructions:  Your physician recommends that you continue on your current medications as directed. Please refer to the Current Medication list given to you today.   Labwork: None ordered  Testing/Procedures: Your physician has requested that you have en exercise stress myoview. For further information please visit [email or web address]. Please follow instruction sheet, as given.   Date & Time: _________________________________________________________________  Follow-Up: Your physician recommends that you schedule a follow-up appointment after testing to review results.  Date & Time:__________________________________________________________________    Any Other Special Instructions Will Be Listed Below (If Applicable).  We will request your records from other facility to review.  [place]  Your caregiver has ordered a Stress Test with nuclear imaging. The purpose of this test is to evaluate the blood supply to your heart muscle. This procedure is referred to as a "Non-Invasive Stress Test." This is because other than having an IV started in your vein, nothing is inserted or "invades" your body. Cardiac stress tests are done to find areas of poor blood flow to the heart by determining the extent of coronary artery disease (CAD). Some patients exercise on a treadmill, which naturally increases the blood flow to your heart, while others who are  unable to walk on a treadmill due to physical limitations have a pharmacologic/chemical stress agent called Lexiscan . This medicine will mimic walking on a treadmill by temporarily increasing your coronary blood flow.   Please note: these test may take anywhere between 2-4 hours to complete  PLEASE REPORT TO [place] AT THE FIRST DESK WILL DIRECT YOU WHERE TO GO  Date of Procedure:__Friday October 01, 2015 at 07:30 AM___________  Arrival Time for Procedure:___Arrive at 07:15  AM_____________    PLEASE NOTIFY THE OFFICE AT LEAST 24 HOURS IN ADVANCE IF YOU ARE UNABLE TO [place].  [phone] AND  PLEASE NOTIFY NUCLEAR MEDICINE AT [facility] AT LEAST 24 HOURS IN ADVANCE IF YOU ARE UNABLE TO KEEP YOUR APPOINTMENT. [phone]  How to prepare for your Myoview test:   Do not eat or drink after midnight  No caffeine for 24 hours prior to test  No smoking 24 hours prior to test.  Your medication may be taken with water.  If your doctor stopped a medication because of this test, do not take that medication.  Ladies, please do not wear dresses.  Skirts or pants are appropriate. Please wear a short sleeve shirt.  No perfume, cologne or lotion.  Wear comfortable walking shoes. No heels!             If you need a refill on your cardiac medications before your next appointment, please call your pharmacy.  Cardiac Nuclear Scanning A cardiac nuclear scan is used to check your heart for problems, such as the following:  A portion of the heart is not getting enough blood.  Part of the heart muscle has died, which happens with a heart attack.  The heart wall is not working normally.  In this test, a radioactive dye (tracer) is injected into your bloodstream. After the tracer has traveled to your heart, a scanning device is used to measure how much of the tracer is absorbed by or distributed to various areas of your heart. LET [facility] CARE PROVIDER KNOW ABOUT:  Any allergies you have.  All medicines you are taking, including vitamins, herbs, eye drops, creams, and over-the-counter medicines.  Previous problems you or members of your family have had with the use of anesthetics.  Any blood disorders you have.  Previous surgeries you have had.  Medical conditions you have.  RISKS AND COMPLICATIONS Generally, this is a safe procedure. However, as with any procedure, problems can occur. Possible problems include:   Serious chest  pain.  Rapid heartbeat.  Sensation of warmth in your chest. This usually passes quickly. BEFORE THE PROCEDURE Ask your health care provider about changing or stopping your regular medicines. PROCEDURE This procedure is usually done at a hospital and takes 2-4 hours.  An IV tube is inserted into one of your veins.  Your health care provider will inject a small amount of radioactive tracer through the tube.  You will then wait for 20-40 minutes while the tracer travels through your bloodstream.  You will lie down on an exam table so images of your heart can be taken. Images will be taken for about 15-20 minutes.  You will exercise on a treadmill or stationary bike. While you exercise, your heart activity will be monitored with an electrocardiogram (ECG), and your blood pressure will be checked.  If you are unable to exercise, you may be given a medicine to make your heart beat faster.  When blood flow to your heart has peaked, tracer will again be injected through the IV tube.  After 20-40 minutes, you will get back on the exam table and have more images taken of your heart.  When the procedure is over, your IV tube will be removed. AFTER THE PROCEDURE  You will likely be able to leave shortly after the test. Unless your health care provider tells you otherwise, you may return to your normal schedule, including diet, activities, and medicines.  Make sure you find out how and when you will get your test results.   This information is not intended to replace advice given to you by your health care provider. Make sure you discuss any questions you have with your health care provider.   Document Released: 07/07/2004 Document Revised: 06/17/2013 Document Reviewed: 05/21/2013 Elsevier Interactive Patient Education [employer].

## 2015-09-24 DIAGNOSIS — N186 End stage renal disease: Secondary | ICD-10-CM | POA: Diagnosis not present

## 2015-09-24 DIAGNOSIS — Z992 Dependence on renal dialysis: Secondary | ICD-10-CM | POA: Diagnosis not present

## 2015-09-25 DIAGNOSIS — Z992 Dependence on renal dialysis: Secondary | ICD-10-CM | POA: Diagnosis not present

## 2015-09-25 DIAGNOSIS — N186 End stage renal disease: Secondary | ICD-10-CM | POA: Diagnosis not present

## 2015-09-25 DIAGNOSIS — D631 Anemia in chronic kidney disease: Secondary | ICD-10-CM | POA: Diagnosis not present

## 2015-09-26 DIAGNOSIS — D631 Anemia in chronic kidney disease: Secondary | ICD-10-CM | POA: Diagnosis not present

## 2015-09-26 DIAGNOSIS — N186 End stage renal disease: Secondary | ICD-10-CM | POA: Diagnosis not present

## 2015-09-26 DIAGNOSIS — Z992 Dependence on renal dialysis: Secondary | ICD-10-CM | POA: Diagnosis not present

## 2015-09-27 DIAGNOSIS — Z992 Dependence on renal dialysis: Secondary | ICD-10-CM | POA: Diagnosis not present

## 2015-09-27 DIAGNOSIS — N186 End stage renal disease: Secondary | ICD-10-CM | POA: Diagnosis not present

## 2015-09-27 DIAGNOSIS — D631 Anemia in chronic kidney disease: Secondary | ICD-10-CM | POA: Diagnosis not present

## 2015-09-28 DIAGNOSIS — D631 Anemia in chronic kidney disease: Secondary | ICD-10-CM | POA: Diagnosis not present

## 2015-09-28 DIAGNOSIS — N2581 Secondary hyperparathyroidism of renal origin: Secondary | ICD-10-CM | POA: Diagnosis not present

## 2015-09-28 DIAGNOSIS — E7800 Pure hypercholesterolemia, unspecified: Secondary | ICD-10-CM | POA: Diagnosis not present

## 2015-09-28 DIAGNOSIS — N186 End stage renal disease: Secondary | ICD-10-CM | POA: Diagnosis not present

## 2015-09-28 DIAGNOSIS — D509 Iron deficiency anemia, unspecified: Secondary | ICD-10-CM | POA: Diagnosis not present

## 2015-09-28 DIAGNOSIS — Z992 Dependence on renal dialysis: Secondary | ICD-10-CM | POA: Diagnosis not present

## 2015-09-29 DIAGNOSIS — Z992 Dependence on renal dialysis: Secondary | ICD-10-CM | POA: Diagnosis not present

## 2015-09-29 DIAGNOSIS — N186 End stage renal disease: Secondary | ICD-10-CM | POA: Diagnosis not present

## 2015-09-29 DIAGNOSIS — D631 Anemia in chronic kidney disease: Secondary | ICD-10-CM | POA: Diagnosis not present

## 2015-09-30 DIAGNOSIS — D631 Anemia in chronic kidney disease: Secondary | ICD-10-CM | POA: Diagnosis not present

## 2015-09-30 DIAGNOSIS — Z992 Dependence on renal dialysis: Secondary | ICD-10-CM | POA: Diagnosis not present

## 2015-09-30 DIAGNOSIS — N186 End stage renal disease: Secondary | ICD-10-CM | POA: Diagnosis not present

## 2015-10-01 ENCOUNTER — Ambulatory Visit
Admission: RE | Admit: 2015-10-01 | Discharge: 2015-10-01 | Disposition: A | Discharge status: Home / Self Care | Payer: 59 | Source: Ambulatory Visit | Attending: Cardiology | Admitting: Cardiology

## 2015-10-01 DIAGNOSIS — D631 Anemia in chronic kidney disease: Secondary | ICD-10-CM | POA: Diagnosis not present

## 2015-10-01 DIAGNOSIS — Z01818 Encounter for other preprocedural examination: Secondary | ICD-10-CM | POA: Insufficient documentation

## 2015-10-01 DIAGNOSIS — Z992 Dependence on renal dialysis: Secondary | ICD-10-CM | POA: Diagnosis not present

## 2015-10-01 DIAGNOSIS — I10 Essential (primary) hypertension: Secondary | ICD-10-CM | POA: Insufficient documentation

## 2015-10-01 DIAGNOSIS — N186 End stage renal disease: Secondary | ICD-10-CM | POA: Diagnosis not present

## 2015-10-01 LAB — NM MYOCAR MULTI W/SPECT W/WALL MOTION / EF
CHL CUP MPHR: 168 {beats}/min
CHL CUP RESTING HR STRESS: 65 {beats}/min
CHL CUP STRESS STAGE 1 SPEED: 0.1 mph
CHL CUP STRESS STAGE 2 GRADE: 0 %
CHL CUP STRESS STAGE 2 HR: 88 {beats}/min
CHL CUP STRESS STAGE 2 SPEED: 0.1 mph
CHL CUP STRESS STAGE 3 GRADE: 10 %
CHL CUP STRESS STAGE 4 GRADE: 11.5 %
CHL CUP STRESS STAGE 5 GRADE: 12 %
CHL CUP STRESS STAGE 5 HR: 146 {beats}/min
CHL CUP STRESS STAGE 5 SPEED: 2.4 mph
CHL CUP STRESS STAGE 6 HR: 136 {beats}/min
CHL CUP STRESS STAGE 7 DBP: 84 mmHg
CHL CUP STRESS STAGE 7 HR: 88 {beats}/min
CHL CUP STRESS STAGE 7 SBP: 165 mmHg
CSEPPMHR: 86 %
Estimated workload: 6.2 METS
Exercise duration (min): 0 min
Exercise duration (sec): 0 s
LV dias vol: 73 mL (ref 62–150)
LVSYSVOL: 20 mL
Peak HR: 146 {beats}/min
Percent HR: 86 %
SDS: 1
SRS: 1
SSS: 2
Stage 1 Grade: 0 %
Stage 1 HR: 88 {beats}/min
Stage 3 HR: 139 {beats}/min
Stage 3 Speed: 1.7 mph
Stage 4 HR: 141 {beats}/min
Stage 4 Speed: 2.5 mph
Stage 6 Grade: 0 %
Stage 6 Speed: 0 mph
Stage 7 Grade: 0 %
Stage 7 Speed: 0 mph
TID: 0.83

## 2015-10-01 MED ORDER — TECHNETIUM TC 99M SESTAMIBI - CARDIOLITE
30.0000 | Freq: Once | INTRAVENOUS | Status: AC | PRN
  Administered 2015-10-01: 09:00:00 32.19 via INTRAVENOUS

## 2015-10-01 MED ORDER — TECHNETIUM TC 99M SESTAMIBI - CARDIOLITE
13.0800 | Freq: Once | INTRAVENOUS | Status: AC | PRN
  Administered 2015-10-01: 13.08 via INTRAVENOUS

## 2015-10-02 DIAGNOSIS — N186 End stage renal disease: Secondary | ICD-10-CM | POA: Diagnosis not present

## 2015-10-02 DIAGNOSIS — D631 Anemia in chronic kidney disease: Secondary | ICD-10-CM | POA: Diagnosis not present

## 2015-10-02 DIAGNOSIS — Z992 Dependence on renal dialysis: Secondary | ICD-10-CM | POA: Diagnosis not present

## 2015-10-03 DIAGNOSIS — N186 End stage renal disease: Secondary | ICD-10-CM | POA: Diagnosis not present

## 2015-10-03 DIAGNOSIS — Z992 Dependence on renal dialysis: Secondary | ICD-10-CM | POA: Diagnosis not present

## 2015-10-03 DIAGNOSIS — D631 Anemia in chronic kidney disease: Secondary | ICD-10-CM | POA: Diagnosis not present

## 2015-10-04 DIAGNOSIS — N186 End stage renal disease: Secondary | ICD-10-CM | POA: Diagnosis not present

## 2015-10-04 DIAGNOSIS — D631 Anemia in chronic kidney disease: Secondary | ICD-10-CM | POA: Diagnosis not present

## 2015-10-04 DIAGNOSIS — Z992 Dependence on renal dialysis: Secondary | ICD-10-CM | POA: Diagnosis not present

## 2015-10-05 DIAGNOSIS — D631 Anemia in chronic kidney disease: Secondary | ICD-10-CM | POA: Diagnosis not present

## 2015-10-05 DIAGNOSIS — N186 End stage renal disease: Secondary | ICD-10-CM | POA: Diagnosis not present

## 2015-10-05 DIAGNOSIS — Z992 Dependence on renal dialysis: Secondary | ICD-10-CM | POA: Diagnosis not present

## 2015-10-06 ENCOUNTER — Inpatient Hospital Stay: Payer: 59 | Attending: Oncology

## 2015-10-06 DIAGNOSIS — Z992 Dependence on renal dialysis: Secondary | ICD-10-CM | POA: Insufficient documentation

## 2015-10-06 DIAGNOSIS — D649 Anemia, unspecified: Secondary | ICD-10-CM | POA: Diagnosis not present

## 2015-10-06 DIAGNOSIS — R5383 Other fatigue: Secondary | ICD-10-CM | POA: Diagnosis not present

## 2015-10-06 DIAGNOSIS — R630 Anorexia: Secondary | ICD-10-CM | POA: Diagnosis not present

## 2015-10-06 DIAGNOSIS — I10 Essential (primary) hypertension: Secondary | ICD-10-CM | POA: Diagnosis not present

## 2015-10-06 DIAGNOSIS — N186 End stage renal disease: Secondary | ICD-10-CM | POA: Insufficient documentation

## 2015-10-06 DIAGNOSIS — E785 Hyperlipidemia, unspecified: Secondary | ICD-10-CM | POA: Insufficient documentation

## 2015-10-06 DIAGNOSIS — R011 Cardiac murmur, unspecified: Secondary | ICD-10-CM | POA: Insufficient documentation

## 2015-10-06 DIAGNOSIS — Z87891 Personal history of nicotine dependence: Secondary | ICD-10-CM | POA: Insufficient documentation

## 2015-10-06 DIAGNOSIS — I120 Hypertensive chronic kidney disease with stage 5 chronic kidney disease or end stage renal disease: Secondary | ICD-10-CM | POA: Diagnosis not present

## 2015-10-06 DIAGNOSIS — K219 Gastro-esophageal reflux disease without esophagitis: Secondary | ICD-10-CM | POA: Diagnosis not present

## 2015-10-06 DIAGNOSIS — E859 Amyloidosis, unspecified: Secondary | ICD-10-CM | POA: Insufficient documentation

## 2015-10-06 DIAGNOSIS — Z79899 Other long term (current) drug therapy: Secondary | ICD-10-CM | POA: Diagnosis not present

## 2015-10-06 DIAGNOSIS — Z8701 Personal history of pneumonia (recurrent): Secondary | ICD-10-CM | POA: Diagnosis not present

## 2015-10-06 DIAGNOSIS — D631 Anemia in chronic kidney disease: Secondary | ICD-10-CM | POA: Diagnosis not present

## 2015-10-06 LAB — BASIC METABOLIC PANEL
ANION GAP: 3 — AB (ref 5–15)
BUN: 64 mg/dL — ABNORMAL HIGH (ref 6–20)
CHLORIDE: 110 mmol/L (ref 101–111)
CO2: 26 mmol/L (ref 22–32)
Calcium: 8.6 mg/dL — ABNORMAL LOW (ref 8.9–10.3)
Creatinine, Ser: 4.84 mg/dL — ABNORMAL HIGH (ref 0.61–1.24)
GFR calc non Af Amer: 13 mL/min — ABNORMAL LOW (ref >60)
GFR, EST AFRICAN AMERICAN: 15 mL/min — AB (ref >60)
Glucose, Bld: 94 mg/dL (ref 65–99)
POTASSIUM: 5 mmol/L (ref 3.5–5.1)
Sodium: 139 mmol/L (ref 135–145)

## 2015-10-06 LAB — CBC WITH DIFFERENTIAL/PLATELET
Basophils Absolute: 0.1 10*3/uL (ref 0–0.1)
Basophils Relative: 1 %
EOS PCT: 7 %
Eosinophils Absolute: 0.7 10*3/uL (ref 0–0.7)
HEMATOCRIT: 31.4 % — AB (ref 40.0–52.0)
Hemoglobin: 10.5 g/dL — ABNORMAL LOW (ref 13.0–18.0)
LYMPHS PCT: 25 %
Lymphs Abs: 2.5 10*3/uL (ref 1.0–3.6)
MCH: 27.5 pg (ref 26.0–34.0)
MCHC: 33.5 g/dL (ref 32.0–36.0)
MCV: 82.1 fL (ref 80.0–100.0)
MONO ABS: 0.6 10*3/uL (ref 0.2–1.0)
MONOS PCT: 6 %
NEUTROS ABS: 5.9 10*3/uL (ref 1.4–6.5)
Neutrophils Relative %: 61 %
Platelets: 217 10*3/uL (ref 150–440)
RBC: 3.83 MIL/uL — ABNORMAL LOW (ref 4.40–5.90)
RDW: 16.9 % — ABNORMAL HIGH (ref 11.5–14.5)
WBC: 9.7 10*3/uL (ref 3.8–10.6)

## 2015-10-07 DIAGNOSIS — N186 End stage renal disease: Secondary | ICD-10-CM | POA: Diagnosis not present

## 2015-10-07 DIAGNOSIS — Z992 Dependence on renal dialysis: Secondary | ICD-10-CM | POA: Diagnosis not present

## 2015-10-07 DIAGNOSIS — D631 Anemia in chronic kidney disease: Secondary | ICD-10-CM | POA: Diagnosis not present

## 2015-10-07 LAB — KAPPA/LAMBDA LIGHT CHAINS
KAPPA FREE LGHT CHN: 113.41 mg/L — AB (ref 3.30–19.40)
Kappa, lambda light chain ratio: 1.65 (ref 0.26–1.65)
LAMDA FREE LIGHT CHAINS: 68.59 mg/L — AB (ref 5.71–26.30)

## 2015-10-08 ENCOUNTER — Ambulatory Visit: Payer: BLUE CROSS/BLUE SHIELD | Admitting: Internal Medicine

## 2015-10-08 DIAGNOSIS — Z992 Dependence on renal dialysis: Secondary | ICD-10-CM | POA: Diagnosis not present

## 2015-10-08 DIAGNOSIS — D631 Anemia in chronic kidney disease: Secondary | ICD-10-CM | POA: Diagnosis not present

## 2015-10-08 DIAGNOSIS — N186 End stage renal disease: Secondary | ICD-10-CM | POA: Diagnosis not present

## 2015-10-09 DIAGNOSIS — N186 End stage renal disease: Secondary | ICD-10-CM | POA: Diagnosis not present

## 2015-10-09 DIAGNOSIS — Z992 Dependence on renal dialysis: Secondary | ICD-10-CM | POA: Diagnosis not present

## 2015-10-09 DIAGNOSIS — D631 Anemia in chronic kidney disease: Secondary | ICD-10-CM | POA: Diagnosis not present

## 2015-10-10 DIAGNOSIS — N186 End stage renal disease: Secondary | ICD-10-CM | POA: Diagnosis not present

## 2015-10-10 DIAGNOSIS — Z992 Dependence on renal dialysis: Secondary | ICD-10-CM | POA: Diagnosis not present

## 2015-10-10 DIAGNOSIS — D631 Anemia in chronic kidney disease: Secondary | ICD-10-CM | POA: Diagnosis not present

## 2015-10-11 DIAGNOSIS — Z992 Dependence on renal dialysis: Secondary | ICD-10-CM | POA: Diagnosis not present

## 2015-10-11 DIAGNOSIS — D631 Anemia in chronic kidney disease: Secondary | ICD-10-CM | POA: Diagnosis not present

## 2015-10-11 DIAGNOSIS — N186 End stage renal disease: Secondary | ICD-10-CM | POA: Diagnosis not present

## 2015-10-12 ENCOUNTER — Ambulatory Visit (INDEPENDENT_AMBULATORY_CARE_PROVIDER_SITE_OTHER): Payer: 59 | Admitting: Family Medicine

## 2015-10-12 DIAGNOSIS — H35713 Central serous chorioretinopathy, bilateral: Secondary | ICD-10-CM | POA: Diagnosis not present

## 2015-10-12 DIAGNOSIS — D631 Anemia in chronic kidney disease: Secondary | ICD-10-CM | POA: Diagnosis not present

## 2015-10-12 DIAGNOSIS — H353231 Exudative age-related macular degeneration, bilateral, with active choroidal neovascularization: Secondary | ICD-10-CM | POA: Diagnosis not present

## 2015-10-12 DIAGNOSIS — Z79899 Other long term (current) drug therapy: Secondary | ICD-10-CM | POA: Diagnosis not present

## 2015-10-12 DIAGNOSIS — H04123 Dry eye syndrome of bilateral lacrimal glands: Secondary | ICD-10-CM | POA: Diagnosis not present

## 2015-10-12 DIAGNOSIS — Z23 Encounter for immunization: Secondary | ICD-10-CM | POA: Diagnosis not present

## 2015-10-12 DIAGNOSIS — H2513 Age-related nuclear cataract, bilateral: Secondary | ICD-10-CM | POA: Diagnosis not present

## 2015-10-12 DIAGNOSIS — N186 End stage renal disease: Secondary | ICD-10-CM | POA: Diagnosis not present

## 2015-10-12 DIAGNOSIS — Z992 Dependence on renal dialysis: Secondary | ICD-10-CM | POA: Diagnosis not present

## 2015-10-13 ENCOUNTER — Inpatient Hospital Stay (HOSPITAL_BASED_OUTPATIENT_CLINIC_OR_DEPARTMENT_OTHER): Payer: 59 | Admitting: Oncology

## 2015-10-13 ENCOUNTER — Encounter: Payer: Self-pay | Admitting: Cardiology

## 2015-10-13 ENCOUNTER — Ambulatory Visit (INDEPENDENT_AMBULATORY_CARE_PROVIDER_SITE_OTHER): Payer: 59 | Admitting: Cardiology

## 2015-10-13 VITALS — BP 126/90 | HR 67 | Ht 72.0 in | Wt 184.2 lb

## 2015-10-13 VITALS — BP 133/83 | HR 67 | Temp 96.7°F | Resp 18 | Wt 184.7 lb

## 2015-10-13 DIAGNOSIS — D649 Anemia, unspecified: Secondary | ICD-10-CM | POA: Diagnosis not present

## 2015-10-13 DIAGNOSIS — E859 Amyloidosis, unspecified: Secondary | ICD-10-CM

## 2015-10-13 DIAGNOSIS — I10 Essential (primary) hypertension: Secondary | ICD-10-CM

## 2015-10-13 DIAGNOSIS — E785 Hyperlipidemia, unspecified: Secondary | ICD-10-CM

## 2015-10-13 DIAGNOSIS — K219 Gastro-esophageal reflux disease without esophagitis: Secondary | ICD-10-CM

## 2015-10-13 DIAGNOSIS — Z8701 Personal history of pneumonia (recurrent): Secondary | ICD-10-CM

## 2015-10-13 DIAGNOSIS — N186 End stage renal disease: Secondary | ICD-10-CM | POA: Diagnosis not present

## 2015-10-13 DIAGNOSIS — Z992 Dependence on renal dialysis: Secondary | ICD-10-CM | POA: Diagnosis not present

## 2015-10-13 DIAGNOSIS — R011 Cardiac murmur, unspecified: Secondary | ICD-10-CM

## 2015-10-13 DIAGNOSIS — Z87891 Personal history of nicotine dependence: Secondary | ICD-10-CM

## 2015-10-13 DIAGNOSIS — R5383 Other fatigue: Secondary | ICD-10-CM | POA: Diagnosis not present

## 2015-10-13 DIAGNOSIS — R630 Anorexia: Secondary | ICD-10-CM | POA: Diagnosis not present

## 2015-10-13 DIAGNOSIS — I120 Hypertensive chronic kidney disease with stage 5 chronic kidney disease or end stage renal disease: Secondary | ICD-10-CM | POA: Diagnosis not present

## 2015-10-13 DIAGNOSIS — D631 Anemia in chronic kidney disease: Secondary | ICD-10-CM | POA: Diagnosis not present

## 2015-10-13 DIAGNOSIS — Z79899 Other long term (current) drug therapy: Secondary | ICD-10-CM

## 2015-10-13 NOTE — Progress Notes (Signed)
Cardiology Office Note   Date:  10/13/2015   ID:  Joseph [name], DOB 08-16-1965, MRN [id number]  Referring Doctor:  Vic Blackbird, MD   Cardiologist:   Wende Bushy, MD   Reason for consultation:  Chief Complaint  Patient presents with  . other    Follow up from [place]. Meds reviewed by the patient verbally. "doing well."      History of Present Illness: Joseph [name] is a 50 y.o. male who presents for  Preoperative evaluation,  Prior to evaluation for kidney transplant , at the request of PCP.Patient is here for follow-up after stress test.   Patient has a diagnosis of end-stage renal disease, currently on peritoneal dialysis. They believe it is secondary to amyloidosis.   Patient does not report any history of chest pain, shortness of breath. He is able to exercise by walking and biking. No symptoms with these activities. No palpitations or no loss of consciousness. He does not report any other symptoms. patient denies headache, fever, cough, colds, don pain, PND, orthopnea, edema.  ROS:  Please see the history of present illness. Aside from mentioned under HPI, all other systems are reviewed and negative.     Past Medical History  Diagnosis Date  . Central serous chorioretinopathy   . GERD (gastroesophageal reflux disease)   . Hypertension     improved since starting dialysis  . Pneumonia     as child  . Blood dyscrasia     amyloidosis  . Heart murmur     never followed up with this, no problems  . Blood clot in abdominal vein     happened around age 66 due to traumatic injury  . Dry eye   . Renal insufficiency   . Hyperlipidemia     Past Surgical History  Procedure Laterality Date  . Renal biopsy, percutaneous  04/06/2015       . Peripheral vascular catheterization Left 04/11/2015    Procedure: Embolization;  Surgeon: Katha Cabal, MD;  Location: [facility];  Service: Cardiovascular;  Laterality: Left;  . Aortogram Bilateral 04/11/2015      Procedure: Aortogram;  Surgeon: Katha Cabal, MD;  Location: [facility];  Service: Cardiovascular;  Laterality: Bilateral;  . Peripheral vascular catheterization N/A 04/16/2015    Procedure: Dialysis/Perma Catheter Insertion;  Surgeon: Katha Cabal, MD;  Location: [facility];  Service: Cardiovascular;  Laterality: N/A;  . Tonsillectomy    . Implantation / placement permanent epidural catheter Right 2016  . Exploratory laparotomy      done to find and remove abdominal blood clot as a teenager  . Capd insertion N/A 07/09/2015    Procedure: LAPAROSCOPIC INSERTION CONTINUOUS AMBULATORY PERITONEAL DIALYSIS  (CAPD) CATHETER;  Surgeon: Katha Cabal, MD;  Location: [facility];  Service: Vascular;  Laterality: N/A;  . Peripheral vascular catheterization N/A 08/24/2015    Procedure: Dialysis/Perma Catheter Removal;  Surgeon: Katha Cabal, MD;  Location: [facility];  Service: Cardiovascular;  Laterality: N/A;     reports that he quit smoking about 8 years ago. His smoking use included Cigarettes. He has a 10 pack-year smoking history. He has never used smokeless tobacco. He reports that he does not drink alcohol or use illicit drugs.   family history includes Birth defects in his sister; Early death in his maternal uncle; Heart attack in his maternal uncle and maternal uncle; Heart disease in his maternal uncle and maternal uncle; Hypertension in his mother  and other; Multiple sclerosis in his sister.   Maternal uncle died at age 45s,  Thought to be from MI. Another maternal uncle died of massive MI in his 22s to 6s.   Current Outpatient Prescriptions  Medication Sig Dispense Refill  . amoxicillin-clavulanate (AUGMENTIN) 500-125 MG tablet Take 2 tablets (1,000 mg total) by mouth daily. (Patient taking differently: Take 2 tablets by mouth daily as needed. ) 2 tablet 0  . atorvastatin (LIPITOR) 20 MG tablet Take 1 tablet (20 mg total) by mouth daily. 90  tablet 3  . cycloSPORINE (RESTASIS) 0.05 % ophthalmic emulsion Place 1 drop into both eyes daily.     Marland Kitchen Epoetin Alfa (EPOGEN IJ) Inject as directed every 30 (thirty) days. Administered at Dialysis center.    . folic acid (FOLVITE) 1 MG tablet Take 1 mg by mouth daily.    Marland Kitchen losartan (COZAAR) 50 MG tablet Take 100 mg by mouth daily.     . methotrexate (RHEUMATREX) 10 MG tablet Take 15 mg by mouth once a week. Caution: Chemotherapy. Protect from light.    . ondansetron (ZOFRAN-ODT) 4 MG disintegrating tablet Take 1 tablet (4 mg total) by mouth every 8 (eight) hours as needed for nausea or vomiting. 30 tablet 0  . pantoprazole (PROTONIX) 40 MG tablet Take 1 tablet (40 mg total) by mouth 2 (two) times daily before a meal. (Patient taking differently: Take 40 mg by mouth daily. ) 60 tablet 0   No current facility-administered medications for this visit.    Allergies: Ciprofloxacin hcl; Doxycycline hyclate; and Phenol-glycerin    PHYSICAL EXAM: VS:  BP 126/90 mmHg  Pulse 67  Ht 6' (1.829 m)  Wt 184 lb 4 oz (83.575 kg)  BMI 24.98 kg/m2  SpO2 98% , Body mass index is 24.98 kg/(m^2). Wt Readings from Last 3 Encounters:  10/13/15 184 lb 4 oz (83.575 kg)  10/13/15 184 lb 11.9 oz (83.8 kg)  09/23/15 183 lb 4 oz (83.122 kg)    GENERAL:  well developed, well nourished, not in acute distress HEENT: normocephalic, pink conjunctivae, anicteric sclerae, no xanthelasma, normal dentition, oropharynx clear NECK:  no neck vein engorgement, JVP normal, no hepatojugular reflux, carotid upstroke brisk and symmetric, no bruit, no thyromegaly, no lymphadenopathy LUNGS:  good respiratory effort, clear to auscultation bilaterally CV:  PMI not displaced, no thrills, no lifts, S1 and S2 within normal limits, no palpable S3 or S4, no murmurs, no rubs, no gallops ABD:  Soft, nontender, nondistended, normoactive bowel sounds, no abdominal aortic bruit, no hepatomegaly, no splenomegaly MS: nontender back, no kyphosis,  no scoliosis, no joint deformities EXT:  2+ DP/PT pulses, no edema, no varicosities, no cyanosis, no clubbing SKIN: warm, nondiaphoretic, normal turgor, no ulcers NEUROPSYCH: alert, oriented to person, place, and time, sensory/motor grossly intact, normal mood, appropriate affect  Recent Labs: 04/12/2015: ALT 119* 04/21/2015: Magnesium 1.7 10/06/2015: BUN 64*; Creatinine, Ser 4.84*; Hemoglobin 10.5*; Platelets 217; Potassium 5.0; Sodium 139   Lipid Panel    Component Value Date/Time   CHOL 311* 09/10/2015 0952   TRIG 233* 09/10/2015 0952   HDL 31* 09/10/2015 0952   CHOLHDL 10.0* 09/10/2015 0952   VLDL 47* 09/10/2015 0952   LDLCALC 233* 09/10/2015 0952     Other studies Reviewed:  EKG:  The ekg from 09/23/2015 was personally reviewed by me and it revealed sinus rhythm, 64 BPM, low voltage QRS.  Additional studies/ records that were reviewed personally reviewed by me today include:  Echo 04/13/2015: Study Conclusions  -  Left ventricle: The cavity size was normal. Systolic function was  vigorous. The estimated ejection fraction was 75%. Wall motion  was normal; there were no regional wall motion abnormalities.  Doppler parameters are consistent with abnormal left ventricular  relaxation (grade 1 diastolic dysfunction). - Aortic valve: Valve area (Vmax): 4.34 cm^2. - Left atrium: The atrium was mildly dilated. - Atrial septum: No defect or patent foramen ovale was identified.  Ex nuc stress test 10/01/2015:  There was no ST segment deviation noted during stress.  Defect 1: There is a small defect of mild severity present in the apex location. Normal wall motion. This likely represent an artifact.  The study is normal.  This is a low risk study.  The left ventricular ejection fraction is normal (55-65%).  ASSESSMENT AND PLAN:   cardiac evaluation prior to consideration for renal transplantation  hypertension  hyperlipidemia  history of possible premature coronary  artery disease in the family  exercise nuclear stress testing: Overall normal. Findings explained to patient and wife. Patient reassured. His echocardiogram from October 2016 showed normal systolic function. No evidence of hypertrophy. Left atrium was mildly dilated.   Hypertension BP is well controlled. Continue monitoring BP. Continue current medical therapy and lifestyle changes.   hyperlipidemia  Lipid levels from 09/10/2015  Were done while patient was not in any statin therapy. Since then, PCP has started him back on atorvastatin 20 mg by mouth daily at bedtime. Patient reports that previously he was taking Crestor and likely the dose was 20 mg. Most likely,  Patient will need a higher dose of Lipitor or might need to go back on Crestor to bring his LDL down to less than 100 ideally. It appears that the PCP has been monitoring his lipid levels. Most likely, he will need a repeat lipid panel and LFTs in about 8-12 weeks since initiation of statin therapy.   in terms of amyloidosis, patient being followed by hematology , as well as nephrology. Traditionally, a cardiac MRI is  Diagnostic modality of choice to evaluate for cardiac amyloidosis. However this is not recommended in end-stage renal disease. I suppose, patient will be further evaluated by transplant team at [facility].  Other options may be RV biopsy or FDG PET.  Current medicines are reviewed at length with the patient today.  The patient does not have concerns regarding medicines.  Labs/ tests ordered today include:  No orders of the defined types were placed in this encounter.    I had a lengthy and detailed discussion with the patient regarding diagnoses, prognosis, diagnostic options, treatment options.   I counseled the patient on importance of lifestyle modification including heart healthy diet, regular physical activity .   Disposition:   FU with undersigned in one year or earlier if necessary  Signed, Wende Bushy, MD    10/13/2015 1:44 PM    [place]

## 2015-10-13 NOTE — Progress Notes (Signed)
Patient has good and bad days where his fatigue is worse.  Does not have much of an appetite and sometimes feels like he is forcing himself to eat.

## 2015-10-13 NOTE — Progress Notes (Signed)
[place]  [phone] [phone]  ID: Jamieson Hetland OB: [birth year]  MR#: [id number]  JAS#:[id number]  Patient Care Team: Alycia Rossetti, MD as PCP - General (Family Medicine)  CHIEF COMPLAINT:  Chief Complaint  Patient presents with  . amyloidosis    INTERVAL HISTORY: Patient returns to clinic today for 3 month evaluation of amyloidosis. He currently feels well and is asymptomatic.   He is tolerating dialysis well. Dr Manuella Ghazi, a retina specialist at [facility], started him on methotrexate about 6 weeks ago for his eyes. He has some fatigue and he has a decreased appetite but no weight loss.  He has no neurologic complaints. He denies any fevers. He denies any chest pain or shortness of breath. He has no abdominal pain. He denies any nausea, vomiting, constipation, or diarrhea. Patient offers no further specific complaints today.  REVIEW OF SYSTEMS:   Review of Systems  Constitutional: Negative for fever, weight loss and malaise/fatigue.  Respiratory: Negative.  Negative for shortness of breath.   Cardiovascular: Negative.  Negative for chest pain.  Gastrointestinal: Negative.  Negative for abdominal pain.  Genitourinary: Negative.   Musculoskeletal: Negative.   Neurological: Negative.  Negative for weakness.    As per HPI. Otherwise, a complete review of systems is negatve.  PAST MEDICAL HISTORY: Past Medical History  Diagnosis Date  . Central serous chorioretinopathy   . GERD (gastroesophageal reflux disease)   . Hypertension     improved since starting dialysis  . Pneumonia     as child  . Blood dyscrasia     amyloidosis  . Heart murmur     never followed up with this, no problems  . Blood clot in abdominal vein     happened around age 50 due to traumatic injury  . Dry eye   . Renal insufficiency   . Hyperlipidemia     PAST SURGICAL HISTORY: Past Surgical History  Procedure Laterality Date  . Renal biopsy, percutaneous   04/06/2015       . Peripheral vascular catheterization Left 04/11/2015    Procedure: Embolization;  Surgeon: Katha Cabal, MD;  Location: [facility] [facility];  Service: Cardiovascular;  Laterality: Left;  . Aortogram Bilateral 04/11/2015    Procedure: Aortogram;  Surgeon: Katha Cabal, MD;  Location: [facility] [facility];  Service: Cardiovascular;  Laterality: Bilateral;  . Peripheral vascular catheterization N/A 04/16/2015    Procedure: Dialysis/Perma Catheter Insertion;  Surgeon: Katha Cabal, MD;  Location: [place] [facility];  Service: Cardiovascular;  Laterality: N/A;  . Tonsillectomy    . Implantation / placement permanent epidural catheter Right 2016  . Exploratory laparotomy      done to find and remove abdominal blood clot as a teenager  . Capd insertion N/A 07/09/2015    Procedure: LAPAROSCOPIC INSERTION CONTINUOUS AMBULATORY PERITONEAL DIALYSIS  (CAPD) CATHETER;  Surgeon: Katha Cabal, MD;  Location: [facility];  Service: Vascular;  Laterality: N/A;  . Peripheral vascular catheterization N/A 08/24/2015    Procedure: Dialysis/Perma Catheter Removal;  Surgeon: Katha Cabal, MD;  Location: [place] [facility];  Service: Cardiovascular;  Laterality: N/A;    FAMILY HISTORY Family History  Problem Relation Age of Onset  . Hypertension Other   . Hypertension Mother   . Birth defects Sister   . Multiple sclerosis Sister   . Heart disease Maternal Uncle   . Early death Maternal Uncle   . Heart attack Maternal Uncle   . Heart attack  Maternal Uncle   . Heart disease Maternal Uncle        ADVANCED DIRECTIVES:    HEALTH MAINTENANCE: Social History  Substance Use Topics  . Smoking status: Former Smoker -- 0.50 packs/day for 20 years    Types: Cigarettes    Quit date: 11/04/2006  . Smokeless tobacco: Never Used  . Alcohol Use: No    Allergies  Allergen Reactions  . Ciprofloxacin Hcl Swelling    High fever  . Doxycycline Hyclate Swelling  .  Phenol-Glycerin Swelling    Current Outpatient Prescriptions  Medication Sig Dispense Refill  . amoxicillin-clavulanate (AUGMENTIN) 500-125 MG tablet Take 2 tablets (1,000 mg total) by mouth daily. (Patient taking differently: Take 2 tablets by mouth daily as needed. ) 2 tablet 0  . atorvastatin (LIPITOR) 20 MG tablet Take 1 tablet (20 mg total) by mouth daily. 90 tablet 3  . cycloSPORINE (RESTASIS) 0.05 % ophthalmic emulsion Place 1 drop into both eyes daily.     Marland Kitchen Epoetin Alfa (EPOGEN IJ) Inject as directed every 30 (thirty) days. Administered at Dialysis center.    . folic acid (FOLVITE) 1 MG tablet Take 1 mg by mouth daily.    Marland Kitchen losartan (COZAAR) 50 MG tablet Take 100 mg by mouth daily.     . methotrexate (RHEUMATREX) 10 MG tablet Take 15 mg by mouth once a week. Caution: Chemotherapy. Protect from light.    . ondansetron (ZOFRAN-ODT) 4 MG disintegrating tablet Take 1 tablet (4 mg total) by mouth every 8 (eight) hours as needed for nausea or vomiting. 30 tablet 0  . pantoprazole (PROTONIX) 40 MG tablet Take 1 tablet (40 mg total) by mouth 2 (two) times daily before a meal. (Patient taking differently: Take 40 mg by mouth daily. ) 60 tablet 0   No current facility-administered medications for this visit.    OBJECTIVE: Filed Vitals:   10/13/15 1140  BP: 133/83  Pulse: 67  Temp: 96.7 F (35.9 C)  Resp: 18     Body mass index is 25.05 kg/(m^2).    ECOG FS:0 - Asymptomatic  General: Well-developed, well-nourished, no acute distress. Eyes: Pink conjunctiva, anicteric sclera. Lungs: Clear to auscultation bilaterally. Heart: Regular rate and rhythm. No rubs, murmurs, or gallops. Abdomen: Soft, nontender, nondistended. No organomegaly noted, normoactive bowel sounds. Musculoskeletal: No edema, cyanosis, or clubbing. Neuro: Alert, answering all questions appropriately. Cranial nerves grossly intact. Skin: No rashes or petechiae noted. Psych: Normal affect.   LAB RESULTS:  Lab  Results  Component Value Date   NA 139 10/06/2015   K 5.0 10/06/2015   CL 110 10/06/2015   CO2 26 10/06/2015   GLUCOSE 94 10/06/2015   BUN 64* 10/06/2015   CREATININE 4.84* 10/06/2015   CALCIUM 8.6* 10/06/2015   PROT 5.4* 04/12/2015   ALBUMIN 2.5* 04/21/2015   AST 32 04/12/2015   ALT 119* 04/12/2015   ALKPHOS 195* 04/12/2015   BILITOT 4.1* 04/12/2015   GFRNONAA 13* 10/06/2015   GFRAA 15* 10/06/2015    Lab Results  Component Value Date   WBC 9.7 10/06/2015   NEUTROABS 5.9 10/06/2015   HGB 10.5* 10/06/2015   HCT 31.4* 10/06/2015   MCV 82.1 10/06/2015   PLT 217 10/06/2015     STUDIES: Nm Myocar Multi W/spect W/wall Motion / Ef  10/01/2015   There was no ST segment deviation noted during stress.  Defect 1: There is a small defect of mild severity present in the apex location. Normal wall motion. This likely represent an artifact.  The study is normal.  This is a low risk study.  The left ventricular ejection fraction is normal (55-65%).     ASSESSMENT: Amyloidosis, unclear if AA or AL subtype.   PLAN:    1. Amyloidosis: Preliminary renal biopsy "does not show evidence for AL amyloid with negative staining for kappa and lambda light chains. Congo red staining, electron microscopy and additional studies to try to identify the molecular origin of the amyloid are also inconclusive."  Final pathology indicates "unclassifiable" Amyloid.  Patient has a negative SPEP but does have an elevated kappa light chain of unclear significance. Fat pad biopsy confirmed systemic amyloid. Bone marrow biopsy had less than 5% plasma cells and only focal deposits of amyloid.  After lengthy discussion with the patient, it was determined not to pursue systemic chemotherapy.  Patient expressed understanding that he will possibly need systemic treatment at some point in the future. Will continue to follow kappa chains closely. Kappa Chains are trending down, now at 113.41. Return to clinic in 3 months  for repeat laboratory work and further evaluation 2. Anemia:  Improving, continue Procrit with dialysis.  3. End-stage renal disease: Secondary to amyloid. Continue dialysis and treatment per nephrology. Patient is also being evaluated for kidney transplant. 4. Fatigue/decreased appetite: Likely secondary to methotrexate. Patient is on Methotrexate 15 mg weekly since March 2017. This will continue for at least 3-6 months and then to be determined.    Approximately 30 minutes was spent in discussion of which greater than 50% was consultation.  Patient expressed understanding and was in agreement with this plan. He also understands that He can call clinic at any time with any questions, concerns, or complaints.    Mayra Reel, NP   10/13/2015 12:15 PM  Patient was seen and evaluated independently and I agree with the assessment and plan as dictated above.  Lloyd Huger, MD 10/13/2015 2:41 PM

## 2015-10-13 NOTE — Patient Instructions (Signed)
Medication Instructions:  Your physician recommends that you continue on your current medications as directed. Please refer to the Current Medication list given to you today.   Labwork: None ordered  Testing/Procedures: None ordered  Follow-Up: Your physician wants you to follow-up in: 6 months with Dr. Yvone Neu. You will receive a reminder letter in the mail two months in advance. If you don't receive a letter, please call our office to schedule the follow-up appointment.   Any Other Special Instructions Will Be Listed Below (If Applicable).     If you need a refill on your cardiac medications before your next appointment, please call your pharmacy.

## 2015-10-14 DIAGNOSIS — D631 Anemia in chronic kidney disease: Secondary | ICD-10-CM | POA: Diagnosis not present

## 2015-10-14 DIAGNOSIS — Z992 Dependence on renal dialysis: Secondary | ICD-10-CM | POA: Diagnosis not present

## 2015-10-14 DIAGNOSIS — N186 End stage renal disease: Secondary | ICD-10-CM | POA: Diagnosis not present

## 2015-10-15 DIAGNOSIS — N186 End stage renal disease: Secondary | ICD-10-CM | POA: Diagnosis not present

## 2015-10-15 DIAGNOSIS — D631 Anemia in chronic kidney disease: Secondary | ICD-10-CM | POA: Diagnosis not present

## 2015-10-15 DIAGNOSIS — Z992 Dependence on renal dialysis: Secondary | ICD-10-CM | POA: Diagnosis not present

## 2015-10-16 DIAGNOSIS — N186 End stage renal disease: Secondary | ICD-10-CM | POA: Diagnosis not present

## 2015-10-16 DIAGNOSIS — D631 Anemia in chronic kidney disease: Secondary | ICD-10-CM | POA: Diagnosis not present

## 2015-10-16 DIAGNOSIS — Z992 Dependence on renal dialysis: Secondary | ICD-10-CM | POA: Diagnosis not present

## 2015-10-17 DIAGNOSIS — D631 Anemia in chronic kidney disease: Secondary | ICD-10-CM | POA: Diagnosis not present

## 2015-10-17 DIAGNOSIS — N186 End stage renal disease: Secondary | ICD-10-CM | POA: Diagnosis not present

## 2015-10-17 DIAGNOSIS — Z992 Dependence on renal dialysis: Secondary | ICD-10-CM | POA: Diagnosis not present

## 2015-10-18 DIAGNOSIS — Z992 Dependence on renal dialysis: Secondary | ICD-10-CM | POA: Diagnosis not present

## 2015-10-18 DIAGNOSIS — N186 End stage renal disease: Secondary | ICD-10-CM | POA: Diagnosis not present

## 2015-10-18 DIAGNOSIS — D631 Anemia in chronic kidney disease: Secondary | ICD-10-CM | POA: Diagnosis not present

## 2015-10-19 DIAGNOSIS — D631 Anemia in chronic kidney disease: Secondary | ICD-10-CM | POA: Diagnosis not present

## 2015-10-19 DIAGNOSIS — N186 End stage renal disease: Secondary | ICD-10-CM | POA: Diagnosis not present

## 2015-10-19 DIAGNOSIS — Z992 Dependence on renal dialysis: Secondary | ICD-10-CM | POA: Diagnosis not present

## 2015-10-20 ENCOUNTER — Telehealth: Payer: Self-pay | Admitting: Gastroenterology

## 2015-10-20 DIAGNOSIS — D631 Anemia in chronic kidney disease: Secondary | ICD-10-CM | POA: Diagnosis not present

## 2015-10-20 DIAGNOSIS — Z992 Dependence on renal dialysis: Secondary | ICD-10-CM | POA: Diagnosis not present

## 2015-10-20 DIAGNOSIS — N186 End stage renal disease: Secondary | ICD-10-CM | POA: Diagnosis not present

## 2015-10-20 NOTE — Telephone Encounter (Signed)
Patient calling to schedule colonoscopy. Referral is in patient chart and was sent on 09/12/15. It looks like [facility] attempted to call, left a voicemail and mailed a letter to try and schedule colonoscopy with patient. Patient called this morning and left a voicemail stating they would like to schedule colonoscopy and the dates that would be good are May 9th, 10th and 11th. Please call for colonoscopy screening.

## 2015-10-21 DIAGNOSIS — D631 Anemia in chronic kidney disease: Secondary | ICD-10-CM | POA: Diagnosis not present

## 2015-10-21 DIAGNOSIS — N186 End stage renal disease: Secondary | ICD-10-CM | POA: Diagnosis not present

## 2015-10-21 DIAGNOSIS — Z992 Dependence on renal dialysis: Secondary | ICD-10-CM | POA: Diagnosis not present

## 2015-10-22 ENCOUNTER — Encounter: Payer: Self-pay | Admitting: Internal Medicine

## 2015-10-22 ENCOUNTER — Ambulatory Visit (INDEPENDENT_AMBULATORY_CARE_PROVIDER_SITE_OTHER): Payer: 59 | Admitting: Internal Medicine

## 2015-10-22 VITALS — BP 120/78 | HR 72 | Ht 72.0 in | Wt 186.0 lb

## 2015-10-22 DIAGNOSIS — J90 Pleural effusion, not elsewhere classified: Secondary | ICD-10-CM | POA: Diagnosis not present

## 2015-10-22 DIAGNOSIS — Z992 Dependence on renal dialysis: Secondary | ICD-10-CM | POA: Diagnosis not present

## 2015-10-22 DIAGNOSIS — D631 Anemia in chronic kidney disease: Secondary | ICD-10-CM | POA: Diagnosis not present

## 2015-10-22 DIAGNOSIS — J449 Chronic obstructive pulmonary disease, unspecified: Secondary | ICD-10-CM

## 2015-10-22 DIAGNOSIS — N186 End stage renal disease: Secondary | ICD-10-CM | POA: Diagnosis not present

## 2015-10-22 NOTE — Patient Instructions (Signed)
Chronic Obstructive Pulmonary Disease Chronic obstructive pulmonary disease (COPD) is a common lung condition in which airflow from the lungs is limited. COPD is a general term that can be used to describe many different lung problems that limit airflow, including both chronic bronchitis and emphysema. If you have COPD, your lung function will probably never return to normal, but there are measures you can take to improve lung function and make yourself feel better. CAUSES   Smoking (common).  Exposure to secondhand smoke.  Genetic problems.  Chronic inflammatory lung diseases or recurrent infections. SYMPTOMS  Shortness of breath, especially with physical activity.  Deep, persistent (chronic) cough with a large amount of thick mucus.  Wheezing.  Rapid breaths (tachypnea).  Gray or bluish discoloration (cyanosis) of the skin, especially in your fingers, toes, or lips.  Fatigue.  Weight loss.  Frequent infections or episodes when breathing symptoms become much worse (exacerbations).  Chest tightness. DIAGNOSIS Your health care provider will take a medical history and perform a physical examination to diagnose COPD. Additional tests for COPD may include:  Lung (pulmonary) function tests.  Chest X-ray.  CT scan.  Blood tests. TREATMENT  Treatment for COPD may include:  Inhaler and nebulizer medicines. These help manage the symptoms of COPD and make your breathing more comfortable.  Supplemental oxygen. Supplemental oxygen is only helpful if you have a low oxygen level in your blood.  Exercise and physical activity. These are beneficial for nearly all people with COPD.  Lung surgery or transplant.  Nutrition therapy to gain weight, if you are underweight.  Pulmonary rehabilitation. This may involve working with a team of health care providers and specialists, such as respiratory, occupational, and physical therapists. HOME CARE INSTRUCTIONS  Take all medicines  (inhaled or pills) as directed by your health care provider.  Avoid over-the-counter medicines or cough syrups that dry up your airway (such as antihistamines) and slow down the elimination of secretions unless instructed otherwise by your health care provider.  If you are a smoker, the most important thing that you can do is stop smoking. Continuing to smoke will cause further lung damage and breathing trouble. Ask your health care provider for help with quitting smoking. He or she can direct you to community resources or hospitals that provide support.  Avoid exposure to irritants such as smoke, chemicals, and fumes that aggravate your breathing.  Use oxygen therapy and pulmonary rehabilitation if directed by your health care provider. If you require home oxygen therapy, ask your health care provider whether you should purchase a pulse oximeter to measure your oxygen level at home.  Avoid contact with individuals who have a contagious illness.  Avoid extreme temperature and humidity changes.  Eat healthy foods. Eating smaller, more frequent meals and resting before meals may help you maintain your strength.  Stay active, but balance activity with periods of rest. Exercise and physical activity will help you maintain your ability to do things you want to do.  Preventing infection and hospitalization is very important when you have COPD. Make sure to receive all the vaccines your health care provider recommends, especially the pneumococcal and influenza vaccines. Ask your health care provider whether you need a pneumonia vaccine.  Learn and use relaxation techniques to manage stress.  Learn and use controlled breathing techniques as directed by your health care provider. Controlled breathing techniques include:  Pursed lip breathing. Start by breathing in (inhaling) through your nose for 1 second. Then, purse your lips as if you were   going to whistle and breathe out (exhale) through the  pursed lips for 2 seconds.  Diaphragmatic breathing. Start by putting one hand on your abdomen just above your waist. Inhale slowly through your nose. The hand on your abdomen should move out. Then purse your lips and exhale slowly. You should be able to feel the hand on your abdomen moving in as you exhale.  Learn and use controlled coughing to clear mucus from your lungs. Controlled coughing is a series of short, progressive coughs. The steps of controlled coughing are: 1. Lean your head slightly forward. 2. Breathe in deeply using diaphragmatic breathing. 3. Try to hold your breath for 3 seconds. 4. Keep your mouth slightly open while coughing twice. 5. Spit any mucus out into a tissue. 6. Rest and repeat the steps once or twice as needed. SEEK MEDICAL CARE IF:  You are coughing up more mucus than usual.  There is a change in the color or thickness of your mucus.  Your breathing is more labored than usual.  Your breathing is faster than usual. SEEK IMMEDIATE MEDICAL CARE IF:  You have shortness of breath while you are resting.  You have shortness of breath that prevents you from:  Being able to talk.  Performing your usual physical activities.  You have chest pain lasting longer than 5 minutes.  Your skin color is more cyanotic than usual.  You measure low oxygen saturations for longer than 5 minutes with a pulse oximeter. MAKE SURE YOU:  Understand these instructions.  Will watch your condition.  Will get help right away if you are not doing well or get worse.   This information is not intended to replace advice given to you by your health care provider. Make sure you discuss any questions you have with your health care provider.   Document Released: 03/22/2005 Document Revised: 07/03/2014 Document Reviewed: 02/06/2013 Elsevier Interactive Patient Education 2016 [employer]  

## 2015-10-22 NOTE — Progress Notes (Signed)
[facility] Consultation     Date: 10/22/2015,   MRN# [id number] Joseph [name] Nov 07, 1965 Code Status:  Hosp day:@LENGTHOFSTAYDAYS @ Referring MD: @ATDPROV @     PCP:      AdmissionWeight: 186 lb (84.369 kg)                 CurrentWeight: 186 lb (84.369 kg)  Synopsis:  Dx of Amyloidosis ESRD on HD with chronic cough -patient with previous h/o hemorrhagic left sided effusion with h/o heavy smoking PFT's: 07/07/15 ratio 86% FEV1 85%, 6MWT WNL DLCO 44%   CC: follow up SOB, cough HPI Cough and SOB have resolved, last  albuterol use 1 month ago  dialysis patient with Amyloidosis s/p PD catheter placement Smoked 1.5 ppd for 30 years  No leg swelling  Noted No signs of infection at this time Has some allergic rhinitis from pollen-but DOES NOT WANT antihistamine therapy  PFTS reviewed with patient        Current Medication:   Current outpatient prescriptions:  .  amoxicillin-clavulanate (AUGMENTIN) 500-125 MG tablet, Take 2 tablets (1,000 mg total) by mouth daily. (Patient taking differently: Take 2 tablets by mouth daily as needed. ), Disp: 2 tablet, Rfl: 0 .  atorvastatin (LIPITOR) 20 MG tablet, Take 1 tablet (20 mg total) by mouth daily., Disp: 90 tablet, Rfl: 3 .  cycloSPORINE (RESTASIS) 0.05 % ophthalmic emulsion, Place 1 drop into both eyes daily. , Disp: , Rfl:  .  Epoetin Alfa (EPOGEN IJ), Inject as directed every 30 (thirty) days. Administered at Dialysis center., Disp: , Rfl:  .  folic acid (FOLVITE) 1 MG tablet, Take 1 mg by mouth daily., Disp: , Rfl:  .  losartan (COZAAR) 50 MG tablet, Take 100 mg by mouth daily. , Disp: , Rfl:  .  methotrexate (RHEUMATREX) 10 MG tablet, Take 15 mg by mouth once a week. Caution: Chemotherapy. Protect from light., Disp: , Rfl:  .  ondansetron (ZOFRAN-ODT) 4 MG disintegrating tablet, Take 1 tablet (4 mg total) by mouth every 8 (eight) hours as needed for nausea or vomiting., Disp: 30 tablet, Rfl: 0 .  pantoprazole  (PROTONIX) 40 MG tablet, Take 1 tablet (40 mg total) by mouth 2 (two) times daily before a meal. (Patient taking differently: Take 40 mg by mouth daily. ), Disp: 60 tablet, Rfl: 0     ALLERGIES   Ciprofloxacin hcl; Doxycycline hyclate; and Phenol-glycerin     REVIEW OF SYSTEMS   Review of Systems  Constitutional: Negative for fever, chills, weight loss and malaise/fatigue.  Respiratory: Negative for cough, shortness of breath and wheezing.   Cardiovascular: Negative for chest pain, orthopnea and leg swelling.  Gastrointestinal: Negative for nausea and abdominal pain.     VS: BP 120/78 mmHg  Pulse 72  Ht 6' (1.829 m)  Wt 186 lb (84.369 kg)  BMI 25.22 kg/m2  SpO2 98%     PHYSICAL EXAM   Physical Exam  Constitutional: No distress.  Cardiovascular: Normal rate, regular rhythm and normal heart sounds.   No murmur heard. Pulmonary/Chest: Effort normal and breath sounds normal. No respiratory distress. He has no wheezes.  Abdominal: Soft. Bowel sounds are normal.  Surgical scars clean intact, PD catheter in place  Skin: He is not diaphoretic.         ASSESSMENT/PLAN   50 yo white male with new Dx of Amyloidosis ESRD on HD with chronic cough-resolved with h/o heavy smoking findings c/w very mild COPD based on flow volume loops with slight concavity  of end exp flow limitation-no acute complaints at this time  1.albuterol as needed 2.avoid second hand smokle 3.cough suppressants as needed   Follow up in 3 months   I have personally obtained a history, examined the patient, evaluated laboratory and independently reviewed imaging results,  The Patient requires high complexity decision making for assessment and support, frequent evaluation and titration of therapies, application of advanced monitoring technologies and extensive interpretation of multiple databases. Patient/Family are satisfied with Plan of action and management. All questions answered   Corrin Parker, M.D.  [facility]  Medical Director [place] Director [facility] Cardio-Pulmonary Department

## 2015-10-23 DIAGNOSIS — N186 End stage renal disease: Secondary | ICD-10-CM | POA: Diagnosis not present

## 2015-10-23 DIAGNOSIS — Z992 Dependence on renal dialysis: Secondary | ICD-10-CM | POA: Diagnosis not present

## 2015-10-23 DIAGNOSIS — D631 Anemia in chronic kidney disease: Secondary | ICD-10-CM | POA: Diagnosis not present

## 2015-10-24 DIAGNOSIS — D631 Anemia in chronic kidney disease: Secondary | ICD-10-CM | POA: Diagnosis not present

## 2015-10-24 DIAGNOSIS — Z992 Dependence on renal dialysis: Secondary | ICD-10-CM | POA: Diagnosis not present

## 2015-10-24 DIAGNOSIS — N186 End stage renal disease: Secondary | ICD-10-CM | POA: Diagnosis not present

## 2015-10-25 DIAGNOSIS — N186 End stage renal disease: Secondary | ICD-10-CM | POA: Diagnosis not present

## 2015-10-25 DIAGNOSIS — Z992 Dependence on renal dialysis: Secondary | ICD-10-CM | POA: Diagnosis not present

## 2015-10-26 DIAGNOSIS — Z992 Dependence on renal dialysis: Secondary | ICD-10-CM | POA: Diagnosis not present

## 2015-10-26 DIAGNOSIS — N186 End stage renal disease: Secondary | ICD-10-CM | POA: Diagnosis not present

## 2015-10-27 DIAGNOSIS — Z992 Dependence on renal dialysis: Secondary | ICD-10-CM | POA: Diagnosis not present

## 2015-10-27 DIAGNOSIS — D509 Iron deficiency anemia, unspecified: Secondary | ICD-10-CM | POA: Diagnosis not present

## 2015-10-27 DIAGNOSIS — N186 End stage renal disease: Secondary | ICD-10-CM | POA: Diagnosis not present

## 2015-10-28 DIAGNOSIS — Z992 Dependence on renal dialysis: Secondary | ICD-10-CM | POA: Diagnosis not present

## 2015-10-28 DIAGNOSIS — N186 End stage renal disease: Secondary | ICD-10-CM | POA: Diagnosis not present

## 2015-10-29 DIAGNOSIS — N186 End stage renal disease: Secondary | ICD-10-CM | POA: Diagnosis not present

## 2015-10-29 DIAGNOSIS — Z992 Dependence on renal dialysis: Secondary | ICD-10-CM | POA: Diagnosis not present

## 2015-10-30 DIAGNOSIS — Z992 Dependence on renal dialysis: Secondary | ICD-10-CM | POA: Diagnosis not present

## 2015-10-30 DIAGNOSIS — N186 End stage renal disease: Secondary | ICD-10-CM | POA: Diagnosis not present

## 2015-10-31 DIAGNOSIS — Z992 Dependence on renal dialysis: Secondary | ICD-10-CM | POA: Diagnosis not present

## 2015-10-31 DIAGNOSIS — N186 End stage renal disease: Secondary | ICD-10-CM | POA: Diagnosis not present

## 2015-11-01 DIAGNOSIS — N186 End stage renal disease: Secondary | ICD-10-CM | POA: Diagnosis not present

## 2015-11-01 DIAGNOSIS — Z992 Dependence on renal dialysis: Secondary | ICD-10-CM | POA: Diagnosis not present

## 2015-11-02 DIAGNOSIS — N186 End stage renal disease: Secondary | ICD-10-CM | POA: Diagnosis not present

## 2015-11-02 DIAGNOSIS — Z992 Dependence on renal dialysis: Secondary | ICD-10-CM | POA: Diagnosis not present

## 2015-11-03 DIAGNOSIS — N186 End stage renal disease: Secondary | ICD-10-CM | POA: Diagnosis not present

## 2015-11-03 DIAGNOSIS — Z992 Dependence on renal dialysis: Secondary | ICD-10-CM | POA: Diagnosis not present

## 2015-11-04 DIAGNOSIS — N186 End stage renal disease: Secondary | ICD-10-CM | POA: Diagnosis not present

## 2015-11-04 DIAGNOSIS — Z992 Dependence on renal dialysis: Secondary | ICD-10-CM | POA: Diagnosis not present

## 2015-11-05 DIAGNOSIS — N186 End stage renal disease: Secondary | ICD-10-CM | POA: Diagnosis not present

## 2015-11-05 DIAGNOSIS — Z992 Dependence on renal dialysis: Secondary | ICD-10-CM | POA: Diagnosis not present

## 2015-11-06 DIAGNOSIS — N186 End stage renal disease: Secondary | ICD-10-CM | POA: Diagnosis not present

## 2015-11-06 DIAGNOSIS — Z992 Dependence on renal dialysis: Secondary | ICD-10-CM | POA: Diagnosis not present

## 2015-11-07 DIAGNOSIS — N186 End stage renal disease: Secondary | ICD-10-CM | POA: Diagnosis not present

## 2015-11-07 DIAGNOSIS — Z992 Dependence on renal dialysis: Secondary | ICD-10-CM | POA: Diagnosis not present

## 2015-11-08 DIAGNOSIS — Z992 Dependence on renal dialysis: Secondary | ICD-10-CM | POA: Diagnosis not present

## 2015-11-08 DIAGNOSIS — N186 End stage renal disease: Secondary | ICD-10-CM | POA: Diagnosis not present

## 2015-11-09 DIAGNOSIS — Z992 Dependence on renal dialysis: Secondary | ICD-10-CM | POA: Diagnosis not present

## 2015-11-09 DIAGNOSIS — N186 End stage renal disease: Secondary | ICD-10-CM | POA: Diagnosis not present

## 2015-11-10 DIAGNOSIS — N186 End stage renal disease: Secondary | ICD-10-CM | POA: Diagnosis not present

## 2015-11-10 DIAGNOSIS — Z992 Dependence on renal dialysis: Secondary | ICD-10-CM | POA: Diagnosis not present

## 2015-11-11 DIAGNOSIS — Z992 Dependence on renal dialysis: Secondary | ICD-10-CM | POA: Diagnosis not present

## 2015-11-11 DIAGNOSIS — N186 End stage renal disease: Secondary | ICD-10-CM | POA: Diagnosis not present

## 2015-11-12 DIAGNOSIS — N186 End stage renal disease: Secondary | ICD-10-CM | POA: Diagnosis not present

## 2015-11-12 DIAGNOSIS — Z992 Dependence on renal dialysis: Secondary | ICD-10-CM | POA: Diagnosis not present

## 2015-11-13 DIAGNOSIS — Z992 Dependence on renal dialysis: Secondary | ICD-10-CM | POA: Diagnosis not present

## 2015-11-13 DIAGNOSIS — N186 End stage renal disease: Secondary | ICD-10-CM | POA: Diagnosis not present

## 2015-11-14 DIAGNOSIS — N186 End stage renal disease: Secondary | ICD-10-CM | POA: Diagnosis not present

## 2015-11-14 DIAGNOSIS — Z992 Dependence on renal dialysis: Secondary | ICD-10-CM | POA: Diagnosis not present

## 2015-11-15 DIAGNOSIS — N186 End stage renal disease: Secondary | ICD-10-CM | POA: Diagnosis not present

## 2015-11-15 DIAGNOSIS — Z992 Dependence on renal dialysis: Secondary | ICD-10-CM | POA: Diagnosis not present

## 2015-11-16 ENCOUNTER — Encounter: Payer: Self-pay | Admitting: Family Medicine

## 2015-11-16 ENCOUNTER — Ambulatory Visit (INDEPENDENT_AMBULATORY_CARE_PROVIDER_SITE_OTHER): Payer: 59 | Admitting: Family Medicine

## 2015-11-16 ENCOUNTER — Other Ambulatory Visit: Payer: Self-pay | Admitting: Family Medicine

## 2015-11-16 VITALS — BP 120/84 | HR 76 | Temp 98.1°F | Resp 18 | Wt 181.0 lb

## 2015-11-16 DIAGNOSIS — I10 Essential (primary) hypertension: Secondary | ICD-10-CM

## 2015-11-16 DIAGNOSIS — E785 Hyperlipidemia, unspecified: Secondary | ICD-10-CM

## 2015-11-16 DIAGNOSIS — N186 End stage renal disease: Secondary | ICD-10-CM

## 2015-11-16 DIAGNOSIS — E859 Amyloidosis, unspecified: Secondary | ICD-10-CM

## 2015-11-16 DIAGNOSIS — Z5181 Encounter for therapeutic drug level monitoring: Secondary | ICD-10-CM

## 2015-11-16 DIAGNOSIS — H30033 Focal chorioretinal inflammation, peripheral, bilateral: Secondary | ICD-10-CM

## 2015-11-16 DIAGNOSIS — Z992 Dependence on renal dialysis: Secondary | ICD-10-CM

## 2015-11-16 LAB — LIPID PANEL
CHOL/HDL RATIO: 5.2 ratio — AB (ref <=5.0)
Cholesterol: 140 mg/dL (ref 125–200)
HDL: 27 mg/dL — ABNORMAL LOW (ref >=40)
LDL Cholesterol: 88 mg/dL (ref <130)
Triglycerides: 125 mg/dL (ref <150)
VLDL: 25 mg/dL (ref <30)

## 2015-11-16 LAB — COMPREHENSIVE METABOLIC PANEL
ALK PHOS: 162 U/L — AB (ref 40–115)
ALT: 45 U/L (ref 9–46)
AST: 23 U/L (ref 10–35)
Albumin: 3.8 g/dL (ref 3.6–5.1)
BUN: 60 mg/dL — AB (ref 7–25)
CO2: 23 mmol/L (ref 20–31)
CREATININE: 5.01 mg/dL — AB (ref 0.70–1.33)
Calcium: 9.2 mg/dL (ref 8.6–10.3)
Chloride: 107 mmol/L (ref 98–110)
Glucose, Bld: 94 mg/dL (ref 70–99)
Potassium: 5.5 mmol/L — ABNORMAL HIGH (ref 3.5–5.3)
SODIUM: 141 mmol/L (ref 135–146)
TOTAL PROTEIN: 6.5 g/dL (ref 6.1–8.1)
Total Bilirubin: 0.5 mg/dL (ref 0.2–1.2)

## 2015-11-16 MED ORDER — LOSARTAN POTASSIUM 100 MG PO TABS: 100.0000 mg | ORAL_TABLET | Freq: Every day | ORAL | Status: DC

## 2015-11-16 NOTE — Assessment & Plan Note (Signed)
Recheck labs goal LDL < 100, currently on lipitor

## 2015-11-16 NOTE — Assessment & Plan Note (Signed)
Blood pressure, still shows good control

## 2015-11-16 NOTE — Assessment & Plan Note (Signed)
The colonoscopy he will have all of his prerequisites needed for the kidney transplant list

## 2015-11-16 NOTE — Patient Instructions (Signed)
We will follow up Colonoscopy  Return in 6 weeks for labs due to Methotrexate  We will call with cholesterol labs  F/U 4 months

## 2015-11-16 NOTE — Assessment & Plan Note (Deleted)
Will give 2nd hepatitis B vaccine today

## 2015-11-16 NOTE — Progress Notes (Signed)
Patient ID: Joseph [name], male   DOB: Oct 26, 1965, 50 y.o.   MRN: [id number]    Subjective:    Patient ID: Joseph [name], male    DOB: 1966/05/22, 50 y.o.   MRN: [id number]  Patient presents for multiple issues Patient here for follow-up from her last visit. He is diagnosed with amyloidosis causing his chronic kidney disease. He is currently trying to get on the transplant list. Her last visit he needed a colonoscopy as well as cardiac clearance and to ensure there was no cardiac involvement.  He was referred to cardiology he had echocardiogram done in 2016 . He also had  stress testing there was no evidence of any amyloidosis and the cardiac muscle. He has been cleared for the transplant was from the cardiology standpoint.  It seems there is been some issues with contacting him from the gastroenterology office so he has not had colonoscopy yet.  He is still getting peritoneal dialysis next Hep B due in August   He still followed by pulmonary for his mild COPD last note reviewed  He is also followed by ophthalmology for his dry eye syndrome  The last visit his cholesterol was significantly elevated I restarted him back on Lipitor 20 mg daily to recheck this today. His cardiologist did feel like he could be moved to Crestor if needed  Specialist notes reviewed at visit  Review Of Systems:  GEN- denies fatigue, fever, weight loss,weakness, recent illness HEENT- denies eye drainage, change in vision, nasal discharge, CVS- denies chest pain, palpitations RESP- denies SOB, cough, wheeze ABD- denies N/V, change in stools, abd pain GU- denies dysuria, hematuria, dribbling, incontinence MSK- denies joint pain, muscle aches, injury Neuro- denies headache, dizziness, syncope, seizure activity       Objective:    BP 120/84 mmHg  Pulse 76  Temp(Src) 98.1 F (36.7 C) (Oral)  Resp 18  Wt 181 lb (82.101 kg) GEN- NAD, alert and oriented x3 HEENT- PERRL, EOMI, non injected sclera, pink  conjunctiva, MMM, oropharynx clear CVS- RRR, no murmur RESP-CTAB EXT- No edema Pulses- Radial  2+        Assessment & Plan:      Problem List Items Addressed This Visit    Hyperlipidemia    Recheck labs goal LDL < 100, currently on lipitor        Relevant Medications   losartan (COZAAR) 100 MG tablet   Other Relevant Orders   Lipid panel   Essential hypertension - Primary    Blood pressure, still shows good control       Relevant Medications   losartan (COZAAR) 100 MG tablet   Other Relevant Orders   CBC with Differential/Platelet   Comprehensive metabolic panel   ESRD on peritoneal dialysis [facility])    The colonoscopy he will have all of his prerequisites needed for the kidney transplant list      Amyloidosis [facility])    Colonoscopy still needs to be done          Note: This dictation was prepared with Dragon dictation along with smaller phrase technology. Any transcriptional errors that result from this process are unintentional.

## 2015-11-16 NOTE — Assessment & Plan Note (Signed)
Colonoscopy still needs to be done

## 2015-11-17 DIAGNOSIS — N186 End stage renal disease: Secondary | ICD-10-CM | POA: Diagnosis not present

## 2015-11-17 DIAGNOSIS — Z992 Dependence on renal dialysis: Secondary | ICD-10-CM | POA: Diagnosis not present

## 2015-11-17 LAB — CBC WITH DIFFERENTIAL/PLATELET
BASOS PCT: 1 %
Basophils Absolute: 76 cells/uL (ref 0–200)
EOS PCT: 11 %
Eosinophils Absolute: 836 cells/uL — ABNORMAL HIGH (ref 15–500)
HCT: 27.9 % — ABNORMAL LOW (ref 38.5–50.0)
Hemoglobin: 8.9 g/dL — CL (ref 13.0–17.0)
Lymphocytes Relative: 35 %
Lymphs Abs: 2660 cells/uL (ref 850–3900)
MCH: 27.2 pg (ref 27.0–33.0)
MCHC: 31.9 g/dL — ABNORMAL LOW (ref 32.0–36.0)
MCV: 85.3 fL (ref 80.0–100.0)
MONOS PCT: 8 %
MPV: 9 fL (ref 7.5–12.5)
Monocytes Absolute: 608 cells/uL (ref 200–950)
NEUTROS ABS: 3420 {cells}/uL (ref 1500–7800)
Neutrophils Relative %: 45 %
PLATELETS: 274 10*3/uL (ref 140–400)
RBC: 3.27 MIL/uL — AB (ref 4.20–5.80)
RDW: 21.6 % — ABNORMAL HIGH (ref 11.0–15.0)
WBC: 7.6 10*3/uL (ref 3.8–10.8)

## 2015-11-18 DIAGNOSIS — N186 End stage renal disease: Secondary | ICD-10-CM | POA: Diagnosis not present

## 2015-11-18 DIAGNOSIS — Z992 Dependence on renal dialysis: Secondary | ICD-10-CM | POA: Diagnosis not present

## 2015-11-19 DIAGNOSIS — N186 End stage renal disease: Secondary | ICD-10-CM | POA: Diagnosis not present

## 2015-11-19 DIAGNOSIS — Z992 Dependence on renal dialysis: Secondary | ICD-10-CM | POA: Diagnosis not present

## 2015-11-20 DIAGNOSIS — N186 End stage renal disease: Secondary | ICD-10-CM | POA: Diagnosis not present

## 2015-11-20 DIAGNOSIS — Z992 Dependence on renal dialysis: Secondary | ICD-10-CM | POA: Diagnosis not present

## 2015-11-21 DIAGNOSIS — N186 End stage renal disease: Secondary | ICD-10-CM | POA: Diagnosis not present

## 2015-11-21 DIAGNOSIS — Z992 Dependence on renal dialysis: Secondary | ICD-10-CM | POA: Diagnosis not present

## 2015-11-22 DIAGNOSIS — Z992 Dependence on renal dialysis: Secondary | ICD-10-CM | POA: Diagnosis not present

## 2015-11-22 DIAGNOSIS — N186 End stage renal disease: Secondary | ICD-10-CM | POA: Diagnosis not present

## 2015-11-23 DIAGNOSIS — Z79899 Other long term (current) drug therapy: Secondary | ICD-10-CM | POA: Diagnosis not present

## 2015-11-23 DIAGNOSIS — H353231 Exudative age-related macular degeneration, bilateral, with active choroidal neovascularization: Secondary | ICD-10-CM | POA: Diagnosis not present

## 2015-11-23 DIAGNOSIS — N186 End stage renal disease: Secondary | ICD-10-CM | POA: Diagnosis not present

## 2015-11-23 DIAGNOSIS — H2513 Age-related nuclear cataract, bilateral: Secondary | ICD-10-CM | POA: Diagnosis not present

## 2015-11-23 DIAGNOSIS — Z992 Dependence on renal dialysis: Secondary | ICD-10-CM | POA: Diagnosis not present

## 2015-11-23 DIAGNOSIS — H35713 Central serous chorioretinopathy, bilateral: Secondary | ICD-10-CM | POA: Diagnosis not present

## 2015-11-24 DIAGNOSIS — N186 End stage renal disease: Secondary | ICD-10-CM | POA: Diagnosis not present

## 2015-11-24 DIAGNOSIS — Z992 Dependence on renal dialysis: Secondary | ICD-10-CM | POA: Diagnosis not present

## 2015-11-25 DIAGNOSIS — N186 End stage renal disease: Secondary | ICD-10-CM | POA: Diagnosis not present

## 2015-11-25 DIAGNOSIS — Z992 Dependence on renal dialysis: Secondary | ICD-10-CM | POA: Diagnosis not present

## 2015-11-26 DIAGNOSIS — Z992 Dependence on renal dialysis: Secondary | ICD-10-CM | POA: Diagnosis not present

## 2015-11-26 DIAGNOSIS — D509 Iron deficiency anemia, unspecified: Secondary | ICD-10-CM | POA: Diagnosis not present

## 2015-11-26 DIAGNOSIS — N186 End stage renal disease: Secondary | ICD-10-CM | POA: Diagnosis not present

## 2015-11-27 DIAGNOSIS — N186 End stage renal disease: Secondary | ICD-10-CM | POA: Diagnosis not present

## 2015-11-27 DIAGNOSIS — Z992 Dependence on renal dialysis: Secondary | ICD-10-CM | POA: Diagnosis not present

## 2015-11-28 DIAGNOSIS — N186 End stage renal disease: Secondary | ICD-10-CM | POA: Diagnosis not present

## 2015-11-28 DIAGNOSIS — Z992 Dependence on renal dialysis: Secondary | ICD-10-CM | POA: Diagnosis not present

## 2015-11-29 DIAGNOSIS — Z992 Dependence on renal dialysis: Secondary | ICD-10-CM | POA: Diagnosis not present

## 2015-11-29 DIAGNOSIS — N186 End stage renal disease: Secondary | ICD-10-CM | POA: Diagnosis not present

## 2015-11-30 DIAGNOSIS — Z992 Dependence on renal dialysis: Secondary | ICD-10-CM | POA: Diagnosis not present

## 2015-11-30 DIAGNOSIS — N186 End stage renal disease: Secondary | ICD-10-CM | POA: Diagnosis not present

## 2015-12-01 DIAGNOSIS — Z992 Dependence on renal dialysis: Secondary | ICD-10-CM | POA: Diagnosis not present

## 2015-12-01 DIAGNOSIS — N186 End stage renal disease: Secondary | ICD-10-CM | POA: Diagnosis not present

## 2015-12-02 ENCOUNTER — Other Ambulatory Visit: Payer: Self-pay

## 2015-12-02 ENCOUNTER — Telehealth: Payer: Self-pay

## 2015-12-02 DIAGNOSIS — Z992 Dependence on renal dialysis: Secondary | ICD-10-CM | POA: Diagnosis not present

## 2015-12-02 DIAGNOSIS — N186 End stage renal disease: Secondary | ICD-10-CM | POA: Diagnosis not present

## 2015-12-02 NOTE — Telephone Encounter (Signed)
Gastroenterology Pre-Procedure Review  Request Date: 01/11/16 Requesting Physician: Dr. Buelah Manis  PATIENT REVIEW QUESTIONS: The patient responded to the following health history questions as indicated:    1. Are you having any GI issues? no 2. Do you have a personal history of Polyps? no 3. Do you have a family history of Colon Cancer or Polyps? no 4. Diabetes Mellitus? no 5. Joint replacements in the past 12 months?no 6. Major health problems in the past 3 months?no 7. Any artificial heart valves, MVP, or defibrillator?no    MEDICATIONS & ALLERGIES:    Patient reports the following regarding taking any anticoagulation/antiplatelet therapy:   Plavix, Coumadin, Eliquis, Xarelto, Lovenox, Pradaxa, Brilinta, or Effient? no Aspirin? no  Patient confirms/reports the following medications:  Current Outpatient Prescriptions  Medication Sig Dispense Refill  . albuterol (PROAIR HFA) 108 (90 Base) MCG/ACT inhaler Inhale into the lungs.    Marland Kitchen atorvastatin (LIPITOR) 20 MG tablet Take 1 tablet (20 mg total) by mouth daily. 90 tablet 3  . cycloSPORINE (RESTASIS) 0.05 % ophthalmic emulsion Place 1 drop into both eyes daily.     Marland Kitchen Epoetin Alfa (EPOGEN IJ) Inject as directed every 30 (thirty) days. Administered at Dialysis center.    . folic acid (FOLVITE) 1 MG tablet Take 1 mg by mouth daily.    Marland Kitchen losartan (COZAAR) 100 MG tablet Take 1 tablet (100 mg total) by mouth daily. 90 tablet 3  . methotrexate (RHEUMATREX) 2.5 MG tablet Take 6 tablets by mouth once a week.  3  . ondansetron (ZOFRAN-ODT) 4 MG disintegrating tablet Take 1 tablet (4 mg total) by mouth every 8 (eight) hours as needed for nausea or vomiting. 30 tablet 0  . pantoprazole (PROTONIX) 40 MG tablet Take 1 tablet (40 mg total) by mouth 2 (two) times daily before a meal. (Patient taking differently: Take 40 mg by mouth daily. ) 60 tablet 0   No current facility-administered medications for this visit.    Patient confirms/reports the  following allergies:  Allergies  Allergen Reactions  . Ciprofloxacin Hcl Swelling    High fever  . Doxycycline Hyclate Swelling  . Phenol-Glycerin Swelling    No orders of the defined types were placed in this encounter.    AUTHORIZATION INFORMATION Primary Insurance: 1D#: Group #:  Secondary Insurance: 1D#: Group #:  SCHEDULE INFORMATION: Date: 01/11/16 Time: Location: [place]

## 2015-12-02 NOTE — Telephone Encounter (Signed)
Pt scheduled for screening colonoscopy at [facility] on 01/11/16. Instructs/rx mailed. Please precert.

## 2015-12-03 DIAGNOSIS — Z992 Dependence on renal dialysis: Secondary | ICD-10-CM | POA: Diagnosis not present

## 2015-12-03 DIAGNOSIS — N186 End stage renal disease: Secondary | ICD-10-CM | POA: Diagnosis not present

## 2015-12-04 DIAGNOSIS — Z992 Dependence on renal dialysis: Secondary | ICD-10-CM | POA: Diagnosis not present

## 2015-12-04 DIAGNOSIS — N186 End stage renal disease: Secondary | ICD-10-CM | POA: Diagnosis not present

## 2015-12-05 DIAGNOSIS — Z992 Dependence on renal dialysis: Secondary | ICD-10-CM | POA: Diagnosis not present

## 2015-12-05 DIAGNOSIS — N186 End stage renal disease: Secondary | ICD-10-CM | POA: Diagnosis not present

## 2015-12-06 DIAGNOSIS — Z992 Dependence on renal dialysis: Secondary | ICD-10-CM | POA: Diagnosis not present

## 2015-12-06 DIAGNOSIS — N186 End stage renal disease: Secondary | ICD-10-CM | POA: Diagnosis not present

## 2015-12-07 DIAGNOSIS — N186 End stage renal disease: Secondary | ICD-10-CM | POA: Diagnosis not present

## 2015-12-07 DIAGNOSIS — Z992 Dependence on renal dialysis: Secondary | ICD-10-CM | POA: Diagnosis not present

## 2015-12-08 DIAGNOSIS — N186 End stage renal disease: Secondary | ICD-10-CM | POA: Diagnosis not present

## 2015-12-08 DIAGNOSIS — Z992 Dependence on renal dialysis: Secondary | ICD-10-CM | POA: Diagnosis not present

## 2015-12-09 DIAGNOSIS — N186 End stage renal disease: Secondary | ICD-10-CM | POA: Diagnosis not present

## 2015-12-09 DIAGNOSIS — Z992 Dependence on renal dialysis: Secondary | ICD-10-CM | POA: Diagnosis not present

## 2015-12-10 DIAGNOSIS — N186 End stage renal disease: Secondary | ICD-10-CM | POA: Diagnosis not present

## 2015-12-10 DIAGNOSIS — Z992 Dependence on renal dialysis: Secondary | ICD-10-CM | POA: Diagnosis not present

## 2015-12-11 DIAGNOSIS — Z992 Dependence on renal dialysis: Secondary | ICD-10-CM | POA: Diagnosis not present

## 2015-12-11 DIAGNOSIS — N186 End stage renal disease: Secondary | ICD-10-CM | POA: Diagnosis not present

## 2015-12-12 DIAGNOSIS — Z992 Dependence on renal dialysis: Secondary | ICD-10-CM | POA: Diagnosis not present

## 2015-12-12 DIAGNOSIS — N186 End stage renal disease: Secondary | ICD-10-CM | POA: Diagnosis not present

## 2015-12-13 DIAGNOSIS — N186 End stage renal disease: Secondary | ICD-10-CM | POA: Diagnosis not present

## 2015-12-13 DIAGNOSIS — Z992 Dependence on renal dialysis: Secondary | ICD-10-CM | POA: Diagnosis not present

## 2015-12-14 DIAGNOSIS — Z992 Dependence on renal dialysis: Secondary | ICD-10-CM | POA: Diagnosis not present

## 2015-12-14 DIAGNOSIS — N186 End stage renal disease: Secondary | ICD-10-CM | POA: Diagnosis not present

## 2015-12-15 ENCOUNTER — Encounter: Payer: Self-pay | Admitting: Family Medicine

## 2015-12-15 ENCOUNTER — Ambulatory Visit (INDEPENDENT_AMBULATORY_CARE_PROVIDER_SITE_OTHER): Payer: 59 | Admitting: Family Medicine

## 2015-12-15 VITALS — BP 102/70 | HR 72 | Temp 98.3°F | Resp 14 | Ht 72.0 in | Wt 184.0 lb

## 2015-12-15 DIAGNOSIS — T148 Other injury of unspecified body region: Secondary | ICD-10-CM

## 2015-12-15 DIAGNOSIS — N186 End stage renal disease: Secondary | ICD-10-CM | POA: Diagnosis not present

## 2015-12-15 DIAGNOSIS — W57XXXA Bitten or stung by nonvenomous insect and other nonvenomous arthropods, initial encounter: Secondary | ICD-10-CM | POA: Diagnosis not present

## 2015-12-15 DIAGNOSIS — R21 Rash and other nonspecific skin eruption: Secondary | ICD-10-CM | POA: Diagnosis not present

## 2015-12-15 DIAGNOSIS — Z992 Dependence on renal dialysis: Secondary | ICD-10-CM | POA: Diagnosis not present

## 2015-12-15 LAB — CBC WITH DIFFERENTIAL/PLATELET
Basophils Absolute: 97 cells/uL (ref 0–200)
Basophils Relative: 1 %
EOS PCT: 9 %
Eosinophils Absolute: 873 cells/uL — ABNORMAL HIGH (ref 15–500)
HCT: 25.9 % — ABNORMAL LOW (ref 38.5–50.0)
HEMOGLOBIN: 8.2 g/dL — AB (ref 13.0–17.0)
LYMPHS ABS: 2522 {cells}/uL (ref 850–3900)
Lymphocytes Relative: 26 %
MCH: 30.1 pg (ref 27.0–33.0)
MCHC: 31.7 g/dL — AB (ref 32.0–36.0)
MCV: 95.2 fL (ref 80.0–100.0)
MONOS PCT: 6 %
MPV: 9.1 fL (ref 7.5–12.5)
Monocytes Absolute: 582 cells/uL (ref 200–950)
NEUTROS ABS: 5626 {cells}/uL (ref 1500–7800)
NEUTROS PCT: 58 %
PLATELETS: 344 10*3/uL (ref 140–400)
RBC: 2.72 MIL/uL — AB (ref 4.20–5.80)
RDW: 25.2 % — ABNORMAL HIGH (ref 11.0–15.0)
WBC: 9.7 10*3/uL (ref 3.8–10.8)

## 2015-12-15 LAB — COMPREHENSIVE METABOLIC PANEL
ALBUMIN: 3.4 g/dL — AB (ref 3.6–5.1)
ALT: 67 U/L — ABNORMAL HIGH (ref 9–46)
AST: 29 U/L (ref 10–35)
Alkaline Phosphatase: 190 U/L — ABNORMAL HIGH (ref 40–115)
BILIRUBIN TOTAL: 0.4 mg/dL (ref 0.2–1.2)
BUN: 64 mg/dL — AB (ref 7–25)
CO2: 23 mmol/L (ref 20–31)
CREATININE: 5.42 mg/dL — AB (ref 0.70–1.33)
Calcium: 8.7 mg/dL (ref 8.6–10.3)
Chloride: 104 mmol/L (ref 98–110)
GLUCOSE: 118 mg/dL — AB (ref 70–99)
Potassium: 5.5 mmol/L — ABNORMAL HIGH (ref 3.5–5.3)
SODIUM: 139 mmol/L (ref 135–146)
Total Protein: 6.2 g/dL (ref 6.1–8.1)

## 2015-12-15 MED ORDER — AMOXICILLIN 500 MG PO CAPS: 500.0000 mg | ORAL_CAPSULE | Freq: Three times a day (TID) | ORAL | Status: DC

## 2015-12-15 MED ORDER — TRIAMCINOLONE ACETONIDE 0.1 % EX CREA: 1.0000 "application " | TOPICAL_CREAM | Freq: Two times a day (BID) | CUTANEOUS | Status: DC

## 2015-12-15 NOTE — Patient Instructions (Signed)
Take antibiotics as prescribed Topical steroid for itching F/U as previous

## 2015-12-15 NOTE — Progress Notes (Signed)
Patient ID: Joseph [name], male   DOB: January 22, 1966, 50 y.o.   MRN: [id number]   Subjective:    Patient ID: Joseph [name], male    DOB: Apr 20, 1966, 49 y.o.   MRN: [id number]  Patient presents for Dermatitis Here with pruritic rash. He states that it started about a month ago his friend and family were over they were outside he did pull couple ticks off of them they also noted some mosquito bites. He however has some lesions that pop up on his arms and his buttocks are very itchy. He states he has not had any fever and overall feels well but is well axis that his appetite has been down he has been more fatigued easily and sleeping more he's had night sweats worsening of the past couple days. He does admit to night sweats. He denies any new joint pain. He is using calamine lotion Wife also felt that he had some nodules in his scalp but those have now resolved She is also concerned that he has memory problems since he is having all these problems with amyloidosis in his kidney failure   Review Of Systems: per above   GEN- + fatigue, fever, weight loss,weakness, recent illness HEENT- denies eye drainage, change in vision, nasal discharge, CVS- denies chest pain, palpitations RESP- denies SOB, cough, wheeze ABD- denies N/V, change in stools, abd pain GU- denies dysuria, hematuria, dribbling, incontinence MSK- denies joint pain, muscle aches, injury Neuro- denies headache, dizziness, syncope, seizure activity       Objective:    BP 102/70 mmHg  Pulse 72  Temp(Src) 98.3 F (36.8 C) (Oral)  Resp 14  Ht 6' (1.829 m)  Wt 184 lb (83.462 kg)  BMI 24.95 kg/m2 GEN- NAD, alert and oriented x3 HEENT- PERRL, EOMI, non injected sclera, pink conjunctiva, MMM, oropharynx clear, TM clear canals clear  Neck- Supple, no thyromegaly CVS- RRR, no murmur RESP-CTAB Skin- tick removed off of left buttocks in exam room- erythema size of dime surrounding, multiple erythematous macules and scabs on bilat legs,  hyperpigmented macules scattered on arms , + excoriations, few scattered pustular lesions  EXT- No edema Pulses- Radial, DP- 2+        Assessment & Plan:      Problem List Items Addressed This Visit    None    Visit Diagnoses    Tick bite    -  Primary    multiple tick bites, vague symptoms that could be related to his amyloid, ESRD, anemia. Check titers. With rash aseen and symptoms start amoxicillin, allergy to doxycycline, Labs sent      Relevant Orders    CBC with Differential/Platelet    Comprehensive metabolic panel    B. burgdorfi antibodies    Rash- no erythema migrans noted     Note: This dictation was prepared with Dragon dictation along with smaller phrase technology. Any transcriptional errors that result from this process are unintentional.

## 2015-12-16 DIAGNOSIS — Z992 Dependence on renal dialysis: Secondary | ICD-10-CM | POA: Diagnosis not present

## 2015-12-16 DIAGNOSIS — N186 End stage renal disease: Secondary | ICD-10-CM | POA: Diagnosis not present

## 2015-12-16 LAB — LYME AB/WESTERN BLOT REFLEX: B burgdorferi Ab IgG+IgM: <0.9 Index (ref <0.90)

## 2015-12-17 DIAGNOSIS — N186 End stage renal disease: Secondary | ICD-10-CM | POA: Diagnosis not present

## 2015-12-17 DIAGNOSIS — Z992 Dependence on renal dialysis: Secondary | ICD-10-CM | POA: Diagnosis not present

## 2015-12-18 DIAGNOSIS — Z992 Dependence on renal dialysis: Secondary | ICD-10-CM | POA: Diagnosis not present

## 2015-12-18 DIAGNOSIS — N186 End stage renal disease: Secondary | ICD-10-CM | POA: Diagnosis not present

## 2015-12-19 DIAGNOSIS — N186 End stage renal disease: Secondary | ICD-10-CM | POA: Diagnosis not present

## 2015-12-19 DIAGNOSIS — Z992 Dependence on renal dialysis: Secondary | ICD-10-CM | POA: Diagnosis not present

## 2015-12-20 ENCOUNTER — Other Ambulatory Visit: Payer: Self-pay | Admitting: *Deleted

## 2015-12-20 DIAGNOSIS — D649 Anemia, unspecified: Secondary | ICD-10-CM

## 2015-12-20 DIAGNOSIS — E875 Hyperkalemia: Secondary | ICD-10-CM

## 2015-12-20 DIAGNOSIS — Z992 Dependence on renal dialysis: Secondary | ICD-10-CM | POA: Diagnosis not present

## 2015-12-20 DIAGNOSIS — N186 End stage renal disease: Secondary | ICD-10-CM | POA: Diagnosis not present

## 2015-12-21 DIAGNOSIS — Z992 Dependence on renal dialysis: Secondary | ICD-10-CM | POA: Diagnosis not present

## 2015-12-21 DIAGNOSIS — N186 End stage renal disease: Secondary | ICD-10-CM | POA: Diagnosis not present

## 2015-12-22 ENCOUNTER — Other Ambulatory Visit: Payer: Medicare Other

## 2015-12-22 DIAGNOSIS — D649 Anemia, unspecified: Secondary | ICD-10-CM | POA: Diagnosis not present

## 2015-12-22 DIAGNOSIS — N186 End stage renal disease: Secondary | ICD-10-CM | POA: Diagnosis not present

## 2015-12-22 DIAGNOSIS — H30033 Focal chorioretinal inflammation, peripheral, bilateral: Secondary | ICD-10-CM

## 2015-12-22 DIAGNOSIS — Z5181 Encounter for therapeutic drug level monitoring: Secondary | ICD-10-CM | POA: Diagnosis not present

## 2015-12-22 DIAGNOSIS — E875 Hyperkalemia: Secondary | ICD-10-CM | POA: Diagnosis not present

## 2015-12-22 DIAGNOSIS — Z992 Dependence on renal dialysis: Secondary | ICD-10-CM | POA: Diagnosis not present

## 2015-12-22 DIAGNOSIS — F419 Anxiety disorder, unspecified: Secondary | ICD-10-CM | POA: Diagnosis not present

## 2015-12-23 DIAGNOSIS — Z992 Dependence on renal dialysis: Secondary | ICD-10-CM | POA: Diagnosis not present

## 2015-12-23 DIAGNOSIS — N186 End stage renal disease: Secondary | ICD-10-CM | POA: Diagnosis not present

## 2015-12-23 LAB — CBC WITH DIFFERENTIAL/PLATELET
BASOS PCT: 0 %
Basophils Absolute: 0 cells/uL (ref 0–200)
Eosinophils Absolute: 910 cells/uL — ABNORMAL HIGH (ref 15–500)
Eosinophils Relative: 10 %
HEMATOCRIT: 24.3 % — AB (ref 38.5–50.0)
Hemoglobin: 7.7 g/dL — CL (ref 13.0–17.0)
LYMPHS PCT: 24 %
Lymphs Abs: 2184 cells/uL (ref 850–3900)
MCH: 30.3 pg (ref 27.0–33.0)
MCHC: 32.1 g/dL (ref 32.0–36.0)
MCV: 95.7 fL (ref 80.0–100.0)
MONOS PCT: 7 %
MPV: 9.1 fL (ref 7.5–12.5)
Monocytes Absolute: 637 cells/uL (ref 200–950)
Neutro Abs: 5369 cells/uL (ref 1500–7800)
Neutrophils Relative %: 59 %
PLATELETS: 250 10*3/uL (ref 140–400)
RBC: 2.54 MIL/uL — AB (ref 4.20–5.80)
RDW: 24.6 % — AB (ref 11.0–15.0)
WBC: 9.1 10*3/uL (ref 3.8–10.8)

## 2015-12-23 LAB — COMPLETE METABOLIC PANEL WITH GFR
ALT: 65 U/L — AB (ref 9–46)
AST: 34 U/L (ref 10–35)
Albumin: 3.2 g/dL — ABNORMAL LOW (ref 3.6–5.1)
Alkaline Phosphatase: 147 U/L — ABNORMAL HIGH (ref 40–115)
BUN: 53 mg/dL — ABNORMAL HIGH (ref 7–25)
CALCIUM: 8.4 mg/dL — AB (ref 8.6–10.3)
CHLORIDE: 109 mmol/L (ref 98–110)
CO2: 21 mmol/L (ref 20–31)
CREATININE: 4.7 mg/dL — AB (ref 0.70–1.33)
GFR, EST AFRICAN AMERICAN: 15 mL/min — AB (ref >=60)
GFR, EST NON AFRICAN AMERICAN: 13 mL/min — AB (ref >=60)
Glucose, Bld: 88 mg/dL (ref 70–99)
Potassium: 5.6 mmol/L — ABNORMAL HIGH (ref 3.5–5.3)
Sodium: 141 mmol/L (ref 135–146)
TOTAL PROTEIN: 5.7 g/dL — AB (ref 6.1–8.1)
Total Bilirubin: 0.4 mg/dL (ref 0.2–1.2)

## 2015-12-24 ENCOUNTER — Encounter: Payer: Self-pay | Admitting: Family Medicine

## 2015-12-24 DIAGNOSIS — Z992 Dependence on renal dialysis: Secondary | ICD-10-CM | POA: Diagnosis not present

## 2015-12-24 DIAGNOSIS — N186 End stage renal disease: Secondary | ICD-10-CM | POA: Diagnosis not present

## 2015-12-25 DIAGNOSIS — N186 End stage renal disease: Secondary | ICD-10-CM | POA: Diagnosis not present

## 2015-12-25 DIAGNOSIS — Z992 Dependence on renal dialysis: Secondary | ICD-10-CM | POA: Diagnosis not present

## 2015-12-26 DIAGNOSIS — N186 End stage renal disease: Secondary | ICD-10-CM | POA: Diagnosis not present

## 2015-12-26 DIAGNOSIS — Z992 Dependence on renal dialysis: Secondary | ICD-10-CM | POA: Diagnosis not present

## 2015-12-27 DIAGNOSIS — Z992 Dependence on renal dialysis: Secondary | ICD-10-CM | POA: Diagnosis not present

## 2015-12-27 DIAGNOSIS — N186 End stage renal disease: Secondary | ICD-10-CM | POA: Diagnosis not present

## 2015-12-28 DIAGNOSIS — Z992 Dependence on renal dialysis: Secondary | ICD-10-CM | POA: Diagnosis not present

## 2015-12-28 DIAGNOSIS — N186 End stage renal disease: Secondary | ICD-10-CM | POA: Diagnosis not present

## 2015-12-29 DIAGNOSIS — N186 End stage renal disease: Secondary | ICD-10-CM | POA: Diagnosis not present

## 2015-12-29 DIAGNOSIS — Z992 Dependence on renal dialysis: Secondary | ICD-10-CM | POA: Diagnosis not present

## 2015-12-29 DIAGNOSIS — H2513 Age-related nuclear cataract, bilateral: Secondary | ICD-10-CM | POA: Diagnosis not present

## 2015-12-29 DIAGNOSIS — H04123 Dry eye syndrome of bilateral lacrimal glands: Secondary | ICD-10-CM | POA: Diagnosis not present

## 2015-12-29 DIAGNOSIS — H0289 Other specified disorders of eyelid: Secondary | ICD-10-CM | POA: Diagnosis not present

## 2015-12-30 ENCOUNTER — Inpatient Hospital Stay: Payer: 59 | Attending: Oncology | Admitting: Oncology

## 2015-12-30 ENCOUNTER — Inpatient Hospital Stay: Payer: 59

## 2015-12-30 VITALS — BP 128/86 | HR 85 | Temp 96.2°F | Resp 18 | Wt 186.3 lb

## 2015-12-30 DIAGNOSIS — R011 Cardiac murmur, unspecified: Secondary | ICD-10-CM | POA: Insufficient documentation

## 2015-12-30 DIAGNOSIS — I129 Hypertensive chronic kidney disease with stage 1 through stage 4 chronic kidney disease, or unspecified chronic kidney disease: Secondary | ICD-10-CM | POA: Diagnosis not present

## 2015-12-30 DIAGNOSIS — E854 Organ-limited amyloidosis: Secondary | ICD-10-CM | POA: Insufficient documentation

## 2015-12-30 DIAGNOSIS — Z87891 Personal history of nicotine dependence: Secondary | ICD-10-CM | POA: Diagnosis not present

## 2015-12-30 DIAGNOSIS — K219 Gastro-esophageal reflux disease without esophagitis: Secondary | ICD-10-CM | POA: Diagnosis not present

## 2015-12-30 DIAGNOSIS — N186 End stage renal disease: Secondary | ICD-10-CM | POA: Diagnosis not present

## 2015-12-30 DIAGNOSIS — D649 Anemia, unspecified: Secondary | ICD-10-CM | POA: Insufficient documentation

## 2015-12-30 DIAGNOSIS — Z992 Dependence on renal dialysis: Secondary | ICD-10-CM | POA: Diagnosis not present

## 2015-12-30 DIAGNOSIS — Z8701 Personal history of pneumonia (recurrent): Secondary | ICD-10-CM | POA: Diagnosis not present

## 2015-12-30 DIAGNOSIS — E785 Hyperlipidemia, unspecified: Secondary | ICD-10-CM | POA: Insufficient documentation

## 2015-12-30 DIAGNOSIS — N08 Glomerular disorders in diseases classified elsewhere: Secondary | ICD-10-CM

## 2015-12-30 DIAGNOSIS — I120 Hypertensive chronic kidney disease with stage 5 chronic kidney disease or end stage renal disease: Secondary | ICD-10-CM | POA: Insufficient documentation

## 2015-12-30 DIAGNOSIS — E859 Amyloidosis, unspecified: Secondary | ICD-10-CM

## 2015-12-30 DIAGNOSIS — Z79899 Other long term (current) drug therapy: Secondary | ICD-10-CM | POA: Insufficient documentation

## 2015-12-30 LAB — CBC WITH DIFFERENTIAL/PLATELET
BASOS ABS: 0.1 10*3/uL (ref 0–0.1)
BASOS PCT: 1 %
EOS ABS: 0.7 10*3/uL (ref 0–0.7)
Eosinophils Relative: 7 %
HEMATOCRIT: 24.9 % — AB (ref 40.0–52.0)
HEMOGLOBIN: 8.2 g/dL — AB (ref 13.0–18.0)
Lymphocytes Relative: 23 %
Lymphs Abs: 2.5 10*3/uL (ref 1.0–3.6)
MCH: 31.5 pg (ref 26.0–34.0)
MCHC: 32.8 g/dL (ref 32.0–36.0)
MCV: 95.9 fL (ref 80.0–100.0)
Monocytes Absolute: 0.6 10*3/uL (ref 0.2–1.0)
Monocytes Relative: 5 %
NEUTROS ABS: 7 10*3/uL — AB (ref 1.4–6.5)
NEUTROS PCT: 64 %
Platelets: 285 10*3/uL (ref 150–440)
RBC: 2.6 MIL/uL — ABNORMAL LOW (ref 4.40–5.90)
RDW: 23.1 % — AB (ref 11.5–14.5)
WBC: 10.8 10*3/uL — ABNORMAL HIGH (ref 3.8–10.6)

## 2015-12-30 LAB — IRON AND TIBC
IRON: 162 ug/dL (ref 45–182)
SATURATION RATIOS: 58 % — AB (ref 17.9–39.5)
TIBC: 277 ug/dL (ref 250–450)
UIBC: 115 ug/dL

## 2015-12-30 LAB — BASIC METABOLIC PANEL
ANION GAP: 5 (ref 5–15)
BUN: 58 mg/dL — ABNORMAL HIGH (ref 6–20)
CALCIUM: 8.9 mg/dL (ref 8.9–10.3)
CHLORIDE: 109 mmol/L (ref 101–111)
CO2: 25 mmol/L (ref 22–32)
CREATININE: 5.22 mg/dL — AB (ref 0.61–1.24)
GFR calc non Af Amer: 11 mL/min — ABNORMAL LOW (ref >60)
GFR, EST AFRICAN AMERICAN: 13 mL/min — AB (ref >60)
Glucose, Bld: 109 mg/dL — ABNORMAL HIGH (ref 65–99)
Potassium: 4.5 mmol/L (ref 3.5–5.1)
SODIUM: 139 mmol/L (ref 135–145)

## 2015-12-30 LAB — VITAMIN B12: Vitamin B-12: 1117 pg/mL — ABNORMAL HIGH (ref 180–914)

## 2015-12-30 LAB — FERRITIN: FERRITIN: 503 ng/mL — AB (ref 24–336)

## 2015-12-30 LAB — FOLATE: Folate: >100 ng/mL (ref >5.9)

## 2015-12-30 LAB — LACTATE DEHYDROGENASE: LDH: 191 U/L (ref 98–192)

## 2015-12-30 NOTE — Progress Notes (Signed)
Offers no complaints  

## 2015-12-30 NOTE — Progress Notes (Signed)
[place]  [phone] [phone]  ID: Joseph [name] OB: [birth year]  MR#: [id number]  JKK#:[id number]  Patient Care Team: Alycia Rossetti, MD as PCP - General (Family Medicine)  CHIEF COMPLAINT:  Chief Complaint  Patient presents with  . amyloidosis    INTERVAL HISTORY: Patient returns to clinic today as an add-on for worsening anemia. He is currently being worked up for the renal transplant list. Once approved it could take 3-5 years. He is tolerating dialysis well.  He has some fatigue and he has a decreased appetite but no weight loss.  He has no neurologic complaints. He denies any fevers. He denies any chest pain or shortness of breath. He has no abdominal pain. He denies any nausea, vomiting, constipation, or diarrhea. Patient offers no specific complaints today.  REVIEW OF SYSTEMS:   Review of Systems  Constitutional: Negative for fever, weight loss and malaise/fatigue.  Respiratory: Negative.  Negative for shortness of breath.   Cardiovascular: Negative.  Negative for chest pain.  Gastrointestinal: Negative.  Negative for abdominal pain.  Genitourinary: Negative.   Musculoskeletal: Negative.   Neurological: Negative.  Negative for weakness.  Endo/Heme/Allergies: Does not bruise/bleed easily.    As per HPI. Otherwise, a complete review of systems is negatve.  PAST MEDICAL HISTORY: Past Medical History  Diagnosis Date  . Central serous chorioretinopathy   . GERD (gastroesophageal reflux disease)   . Hypertension     improved since starting dialysis  . Pneumonia     as child  . Blood dyscrasia     amyloidosis  . Heart murmur     never followed up with this, no problems  . Blood clot in abdominal vein     happened around age 89 due to traumatic injury  . Dry eye   . Renal insufficiency   . Hyperlipidemia     PAST SURGICAL HISTORY: Past Surgical History  Procedure Laterality Date  . Renal biopsy, percutaneous   04/06/2015       . Peripheral vascular catheterization Left 04/11/2015    Procedure: Embolization;  Surgeon: Katha Cabal, MD;  Location: [place] [facility];  Service: Cardiovascular;  Laterality: Left;  . Aortogram Bilateral 04/11/2015    Procedure: Aortogram;  Surgeon: Katha Cabal, MD;  Location: [place] [facility];  Service: Cardiovascular;  Laterality: Bilateral;  . Peripheral vascular catheterization N/A 04/16/2015    Procedure: Dialysis/Perma Catheter Insertion;  Surgeon: Katha Cabal, MD;  Location: [place] [facility];  Service: Cardiovascular;  Laterality: N/A;  . Tonsillectomy    . Implantation / placement permanent epidural catheter Right 2016  . Exploratory laparotomy      done to find and remove abdominal blood clot as a teenager  . Capd insertion N/A 07/09/2015    Procedure: LAPAROSCOPIC INSERTION CONTINUOUS AMBULATORY PERITONEAL DIALYSIS  (CAPD) CATHETER;  Surgeon: Katha Cabal, MD;  Location: [facility];  Service: Vascular;  Laterality: N/A;  . Peripheral vascular catheterization N/A 08/24/2015    Procedure: Dialysis/Perma Catheter Removal;  Surgeon: Katha Cabal, MD;  Location: [place] [facility];  Service: Cardiovascular;  Laterality: N/A;    FAMILY HISTORY Family History  Problem Relation Age of Onset  . Hypertension Other   . Hypertension Mother   . Birth defects Sister   . Multiple sclerosis Sister   . Heart disease Maternal Uncle   . Early death Maternal Uncle   . Heart attack Maternal Uncle   . Heart attack Maternal Uncle   .  Heart disease Maternal Uncle        ADVANCED DIRECTIVES:    HEALTH MAINTENANCE: Social History  Substance Use Topics  . Smoking status: Former Smoker -- 0.50 packs/day for 20 years    Types: Cigarettes    Quit date: 11/04/2006  . Smokeless tobacco: Never Used  . Alcohol Use: No    Allergies  Allergen Reactions  . Ciprofloxacin Hcl Swelling    High fever  . Doxycycline Hyclate Swelling  .  Phenol-Glycerin Swelling    Current Outpatient Prescriptions  Medication Sig Dispense Refill  . albuterol (PROAIR HFA) 108 (90 Base) MCG/ACT inhaler Inhale into the lungs.    . Artificial Tear Solution (SOOTHE XP OP) Apply 1 drop to eye as needed.    Marland Kitchen atorvastatin (LIPITOR) 20 MG tablet Take 1 tablet (20 mg total) by mouth daily. 90 tablet 3  . cycloSPORINE (RESTASIS) 0.05 % ophthalmic emulsion Place 1 drop into both eyes daily.     Marland Kitchen Epoetin Alfa (EPOGEN IJ) Inject as directed every 30 (thirty) days. Administered at Dialysis center.    . folic acid (FOLVITE) 1 MG tablet Take 2 mg by mouth daily.     . Hypromellose (ARTIFICIAL TEARS OP) Apply 1 drop to eye as needed.    Marland Kitchen losartan (COZAAR) 100 MG tablet Take 1 tablet (100 mg total) by mouth daily. (Patient taking differently: Take 50 mg by mouth daily. ) 90 tablet 3  . methotrexate (RHEUMATREX) 2.5 MG tablet Take 6 tablets by mouth once a week.  3  . ondansetron (ZOFRAN-ODT) 4 MG disintegrating tablet Take 1 tablet (4 mg total) by mouth every 8 (eight) hours as needed for nausea or vomiting. 30 tablet 0  . pantoprazole (PROTONIX) 40 MG tablet Take 1 tablet (40 mg total) by mouth 2 (two) times daily before a meal. (Patient taking differently: Take 40 mg by mouth daily. ) 60 tablet 0  . triamcinolone cream (KENALOG) 0.1 % Apply 1 application topically 2 (two) times daily. 30 g 0  . VELTASSA 8.4 g packet Take 1 packet by mouth daily.  3   No current facility-administered medications for this visit.    OBJECTIVE: Filed Vitals:   12/30/15 1034  BP: 128/86  Pulse: 85  Temp: 96.2 F (35.7 C)  Resp: 18     Body mass index is 25.26 kg/(m^2).    ECOG FS:0 - Asymptomatic  General: Well-developed, well-nourished, no acute distress. Eyes: Pink conjunctiva, anicteric sclera. Lungs: Clear to auscultation bilaterally. Heart: Regular rate and rhythm. No rubs, murmurs, or gallops. Abdomen: Soft, nontender, nondistended. No organomegaly noted,  normoactive bowel sounds. Musculoskeletal: No edema, cyanosis, or clubbing. Neuro: Alert, answering all questions appropriately. Cranial nerves grossly intact. Skin: No rashes or petechiae noted. Psych: Normal affect.   LAB RESULTS:  Lab Results  Component Value Date   NA 139 12/30/2015   K 4.5 12/30/2015   CL 109 12/30/2015   CO2 25 12/30/2015   GLUCOSE 109* 12/30/2015   BUN 58* 12/30/2015   CREATININE 5.22* 12/30/2015   CALCIUM 8.9 12/30/2015   PROT 5.7* 12/22/2015   ALBUMIN 3.2* 12/22/2015   AST 34 12/22/2015   ALT 65* 12/22/2015   ALKPHOS 147* 12/22/2015   BILITOT 0.4 12/22/2015   GFRNONAA 11* 12/30/2015   GFRAA 13* 12/30/2015    Lab Results  Component Value Date   WBC 10.8* 12/30/2015   NEUTROABS 7.0* 12/30/2015   HGB 8.2* 12/30/2015   HCT 24.9* 12/30/2015   MCV 95.9 12/30/2015  PLT 285 12/30/2015   Lab Results  Component Value Date   IRON 162 12/30/2015   TIBC 277 12/30/2015   IRONPCTSAT 58* 12/30/2015    Lab Results  Component Value Date   FERRITIN 503* 12/30/2015     STUDIES: No results found.  ASSESSMENT: Primary renal amyloidosis, unclear if AA or AL subtype.   PLAN:    1. Primary renal amyloidosis: Preliminary renal biopsy "does not show evidence for AL amyloid with negative staining for kappa and lambda light chains. Congo red staining, electron microscopy and additional studies to try to identify the molecular origin of the amyloid are also inconclusive."  Final pathology indicates "unclassifiable" Amyloid.  Patient has a negative SPEP but does have an elevated kappa light chain of unclear significance. Fat pad biopsy confirmed systemic amyloid. Bone marrow biopsy had less than 5% plasma cells and only focal deposits of amyloid.  After lengthy discussion with the patient, it was determined not to pursue systemic chemotherapy.  Patient expressed understanding that he will possibly need systemic treatment at some point in the future. Will  continue to follow kappa chains closely. Kappa chains are trending down, now at 113.41. In light of patient's declining hemoglobin, repeat kappa chains today. If getting worse, will consider repeat bone marrow biopsy in the future. Return to clinic in 3 months for repeat laboratory work and further evaluation 2. Anemia:  Slightly worse. Iron stores, B12, folate, and hemolysis labs are all within normal limits. Patient states he recently doubled his dose of Epogen at dialysis. No intervention is needed at this time. If his kappa light chains are trending up, can consider bone marrow biopsy to further evaluate. Patient is also taking methotrexate 33m once per week since March 2017. This low dose is unlikely contributing but possible. Monitor.  3. End-stage renal disease: Secondary to amyloid. Continue dialysis and treatment per nephrology. Patient is also being evaluated for kidney transplant.   Approximately 30 minutes was spent in discussion of which greater than 50% was consultation.  Patient expressed understanding and was in agreement with this plan. He also understands that He can call clinic at any time with any questions, concerns, or complaints.    TLloyd Huger MD   12/30/2015 4:58 PM  Patient was seen and evaluated independently and I agree with the assessment and plan as dictated above.  TLloyd Huger MD 12/30/2015 4:58 PM

## 2015-12-31 DIAGNOSIS — E7800 Pure hypercholesterolemia, unspecified: Secondary | ICD-10-CM | POA: Diagnosis not present

## 2015-12-31 DIAGNOSIS — N186 End stage renal disease: Secondary | ICD-10-CM | POA: Diagnosis not present

## 2015-12-31 DIAGNOSIS — Z1159 Encounter for screening for other viral diseases: Secondary | ICD-10-CM | POA: Diagnosis not present

## 2015-12-31 DIAGNOSIS — Z992 Dependence on renal dialysis: Secondary | ICD-10-CM | POA: Diagnosis not present

## 2015-12-31 LAB — KAPPA/LAMBDA LIGHT CHAINS
KAPPA FREE LGHT CHN: 116.3 mg/L — AB (ref 3.3–19.4)
Kappa, lambda light chain ratio: 1.67 — ABNORMAL HIGH (ref 0.26–1.65)
LAMDA FREE LIGHT CHAINS: 69.5 mg/L — AB (ref 5.7–26.3)

## 2015-12-31 LAB — HAPTOGLOBIN: Haptoglobin: 229 mg/dL — ABNORMAL HIGH (ref 34–200)

## 2016-01-01 DIAGNOSIS — Z992 Dependence on renal dialysis: Secondary | ICD-10-CM | POA: Diagnosis not present

## 2016-01-01 DIAGNOSIS — N186 End stage renal disease: Secondary | ICD-10-CM | POA: Diagnosis not present

## 2016-01-02 DIAGNOSIS — N186 End stage renal disease: Secondary | ICD-10-CM | POA: Diagnosis not present

## 2016-01-02 DIAGNOSIS — Z992 Dependence on renal dialysis: Secondary | ICD-10-CM | POA: Diagnosis not present

## 2016-01-03 DIAGNOSIS — Z992 Dependence on renal dialysis: Secondary | ICD-10-CM | POA: Diagnosis not present

## 2016-01-03 DIAGNOSIS — N186 End stage renal disease: Secondary | ICD-10-CM | POA: Diagnosis not present

## 2016-01-04 DIAGNOSIS — N186 End stage renal disease: Secondary | ICD-10-CM | POA: Diagnosis not present

## 2016-01-04 DIAGNOSIS — Z992 Dependence on renal dialysis: Secondary | ICD-10-CM | POA: Diagnosis not present

## 2016-01-05 DIAGNOSIS — Z992 Dependence on renal dialysis: Secondary | ICD-10-CM | POA: Diagnosis not present

## 2016-01-05 DIAGNOSIS — N186 End stage renal disease: Secondary | ICD-10-CM | POA: Diagnosis not present

## 2016-01-06 DIAGNOSIS — Z992 Dependence on renal dialysis: Secondary | ICD-10-CM | POA: Diagnosis not present

## 2016-01-06 DIAGNOSIS — N186 End stage renal disease: Secondary | ICD-10-CM | POA: Diagnosis not present

## 2016-01-07 DIAGNOSIS — N186 End stage renal disease: Secondary | ICD-10-CM | POA: Diagnosis not present

## 2016-01-07 DIAGNOSIS — Z992 Dependence on renal dialysis: Secondary | ICD-10-CM | POA: Diagnosis not present

## 2016-01-08 DIAGNOSIS — Z992 Dependence on renal dialysis: Secondary | ICD-10-CM | POA: Diagnosis not present

## 2016-01-08 DIAGNOSIS — N186 End stage renal disease: Secondary | ICD-10-CM | POA: Diagnosis not present

## 2016-01-09 DIAGNOSIS — N186 End stage renal disease: Secondary | ICD-10-CM | POA: Diagnosis not present

## 2016-01-09 DIAGNOSIS — Z992 Dependence on renal dialysis: Secondary | ICD-10-CM | POA: Diagnosis not present

## 2016-01-10 ENCOUNTER — Encounter: Payer: Self-pay | Admitting: *Deleted

## 2016-01-10 DIAGNOSIS — Z992 Dependence on renal dialysis: Secondary | ICD-10-CM | POA: Diagnosis not present

## 2016-01-10 DIAGNOSIS — N186 End stage renal disease: Secondary | ICD-10-CM | POA: Diagnosis not present

## 2016-01-11 ENCOUNTER — Encounter
Admission: RE | Disposition: A | Discharge status: Home / Self Care | Payer: Self-pay | Source: Ambulatory Visit | Attending: Gastroenterology

## 2016-01-11 ENCOUNTER — Ambulatory Visit: Payer: 59 | Admitting: Anesthesiology

## 2016-01-11 ENCOUNTER — Ambulatory Visit
Admission: RE | Admit: 2016-01-11 | Discharge: 2016-01-11 | Disposition: A | Discharge status: Home / Self Care | Payer: 59 | Source: Ambulatory Visit | Attending: Gastroenterology | Admitting: Gastroenterology

## 2016-01-11 DIAGNOSIS — R011 Cardiac murmur, unspecified: Secondary | ICD-10-CM | POA: Diagnosis not present

## 2016-01-11 DIAGNOSIS — Z87891 Personal history of nicotine dependence: Secondary | ICD-10-CM | POA: Insufficient documentation

## 2016-01-11 DIAGNOSIS — Z8249 Family history of ischemic heart disease and other diseases of the circulatory system: Secondary | ICD-10-CM | POA: Diagnosis not present

## 2016-01-11 DIAGNOSIS — K219 Gastro-esophageal reflux disease without esophagitis: Secondary | ICD-10-CM | POA: Diagnosis not present

## 2016-01-11 DIAGNOSIS — H35719 Central serous chorioretinopathy, unspecified eye: Secondary | ICD-10-CM | POA: Diagnosis not present

## 2016-01-11 DIAGNOSIS — Z79899 Other long term (current) drug therapy: Secondary | ICD-10-CM | POA: Diagnosis not present

## 2016-01-11 DIAGNOSIS — Z1211 Encounter for screening for malignant neoplasm of colon: Secondary | ICD-10-CM | POA: Insufficient documentation

## 2016-01-11 DIAGNOSIS — Z888 Allergy status to other drugs, medicaments and biological substances status: Secondary | ICD-10-CM | POA: Diagnosis not present

## 2016-01-11 DIAGNOSIS — K641 Second degree hemorrhoids: Secondary | ICD-10-CM | POA: Diagnosis not present

## 2016-01-11 DIAGNOSIS — Z992 Dependence on renal dialysis: Secondary | ICD-10-CM | POA: Diagnosis not present

## 2016-01-11 DIAGNOSIS — I10 Essential (primary) hypertension: Secondary | ICD-10-CM | POA: Insufficient documentation

## 2016-01-11 DIAGNOSIS — E785 Hyperlipidemia, unspecified: Secondary | ICD-10-CM | POA: Diagnosis not present

## 2016-01-11 DIAGNOSIS — Z820 Family history of epilepsy and other diseases of the nervous system: Secondary | ICD-10-CM | POA: Diagnosis not present

## 2016-01-11 DIAGNOSIS — K5730 Diverticulosis of large intestine without perforation or abscess without bleeding: Secondary | ICD-10-CM | POA: Insufficient documentation

## 2016-01-11 DIAGNOSIS — H04129 Dry eye syndrome of unspecified lacrimal gland: Secondary | ICD-10-CM | POA: Diagnosis not present

## 2016-01-11 DIAGNOSIS — K5790 Diverticulosis of intestine, part unspecified, without perforation or abscess without bleeding: Secondary | ICD-10-CM | POA: Diagnosis not present

## 2016-01-11 DIAGNOSIS — E859 Amyloidosis, unspecified: Secondary | ICD-10-CM | POA: Insufficient documentation

## 2016-01-11 DIAGNOSIS — N289 Disorder of kidney and ureter, unspecified: Secondary | ICD-10-CM | POA: Diagnosis not present

## 2016-01-11 DIAGNOSIS — N186 End stage renal disease: Secondary | ICD-10-CM | POA: Diagnosis not present

## 2016-01-11 DIAGNOSIS — D649 Anemia, unspecified: Secondary | ICD-10-CM | POA: Diagnosis not present

## 2016-01-11 DIAGNOSIS — K648 Other hemorrhoids: Secondary | ICD-10-CM | POA: Diagnosis not present

## 2016-01-11 HISTORY — PX: COLONOSCOPY WITH PROPOFOL: SHX5780

## 2016-01-11 SURGERY — COLONOSCOPY WITH PROPOFOL
Anesthesia: General

## 2016-01-11 MED ORDER — LIDOCAINE HCL (CARDIAC) 20 MG/ML IV SOLN
INTRAVENOUS | Status: DC | PRN
  Administered 2016-01-11: 60 mg via INTRAVENOUS

## 2016-01-11 MED ORDER — PROPOFOL 10 MG/ML IV BOLUS
INTRAVENOUS | Status: DC | PRN
  Administered 2016-01-11: 80 mg via INTRAVENOUS
  Administered 2016-01-11: 20 mg via INTRAVENOUS

## 2016-01-11 MED ORDER — SODIUM CHLORIDE 0.9 % IV SOLN: INTRAVENOUS | Status: DC

## 2016-01-11 MED ORDER — SODIUM CHLORIDE 0.9 % IV SOLN
INTRAVENOUS | Status: DC
  Administered 2016-01-11: 1000 mL via INTRAVENOUS

## 2016-01-11 MED ORDER — PROPOFOL 500 MG/50ML IV EMUL
INTRAVENOUS | Status: DC | PRN
  Administered 2016-01-11: 160 ug/kg/min via INTRAVENOUS

## 2016-01-11 NOTE — Anesthesia Preprocedure Evaluation (Addendum)
Anesthesia Evaluation  Patient identified by MRN, date of birth, ID band Patient awake    Reviewed: Allergy & Precautions, H&P , NPO status , Patient's Chart, lab work & pertinent test results, reviewed documented beta blocker date and time   Airway Mallampati: II   Neck ROM: full    Dental  (+) Poor Dentition   Pulmonary neg pulmonary ROS, pneumonia, COPD, former smoker,    Pulmonary exam normal        Cardiovascular hypertension, negative cardio ROS Normal cardiovascular exam+ Valvular Problems/Murmurs  Rhythm:regular Rate:Normal     Neuro/Psych negative neurological ROS  negative psych ROS   GI/Hepatic negative GI ROS, Neg liver ROS, GERD  Medicated,  Endo/Other  negative endocrine ROS  Renal/GU Renal diseasenegative Renal ROS  negative genitourinary   Musculoskeletal   Abdominal   Peds  Hematology negative hematology ROS (+) Blood dyscrasia, anemia ,   Anesthesia Other Findings Past Medical History:   Central serous chorioretinopathy                             GERD (gastroesophageal reflux disease)                       Hypertension                                                   Comment:improved since starting dialysis   Pneumonia                                                      Comment:as child   Heart murmur                                                   Comment:never followed up with this, no problems   Blood clot in abdominal vein                                   Comment:happened around age 27 due to traumatic injury   Dry eye                                                      Renal insufficiency                                          Hyperlipidemia                                               Blood dyscrasia  Comment:amyloidosis Past Surgical History:   RENAL BIOPSY, PERCUTANEOUS                       04/06/2015     Comment:   PERIPHERAL VASCULAR CATHETERIZATION             Left 04/11/2015     Comment:Procedure: Embolization;  Surgeon: Katha Cabal, MD;  Location: [place] [facility];                Service: Cardiovascular;  Laterality: Left;   AORTOGRAM                                       Bilateral 04/11/2015     Comment:Procedure: Aortogram;  Surgeon: Katha Cabal, MD;  Location: [place] [facility];                Service: Cardiovascular;  Laterality:               Bilateral;   PERIPHERAL VASCULAR CATHETERIZATION             N/A 04/16/2015     Comment:Procedure: Dialysis/Perma Catheter Insertion;                Surgeon: Katha Cabal, MD;  Location: [place] [facility];  Service: Cardiovascular;                Laterality: N/A;   TONSILLECTOMY                                                 IMPLANTATION / PLACEMENT PERMANENT EPIDURAL CA* Right 2016         EXPLORATORY LAPAROTOMY                                          Comment:done to find and remove abdominal blood clot as              a teenager   CAPD INSERTION                                  N/A 07/09/2015      Comment:Procedure: LAPAROSCOPIC INSERTION CONTINUOUS               AMBULATORY PERITONEAL DIALYSIS  (CAPD)               CATHETER;  Surgeon: Katha Cabal, MD;                Location: [facility];  Service: Vascular;                Laterality: N/A;   PERIPHERAL VASCULAR CATHETERIZATION             N/A 08/24/2015      Comment:Procedure: Dialysis/Perma Catheter Removal;  Surgeon: Katha Cabal, MD;  Location: [place] [facility];  Service: Cardiovascular;                Laterality: N/A;   Reproductive/Obstetrics                            Anesthesia Physical Anesthesia Plan  ASA: III  Anesthesia Plan: General   Post-op Pain Management:    Induction:   Airway Management Planned:   Additional Equipment:   Intra-op Plan:    Post-operative Plan:   Informed Consent: I have reviewed the patients History and Physical, chart, labs and discussed the procedure including the risks, benefits and alternatives for the proposed anesthesia with the patient or authorized representative who has indicated his/her understanding and acceptance.   Dental Advisory Given  Plan Discussed with: CRNA  Anesthesia Plan Comments: (Last peritoneal dialysis yesterday.  JA)       Anesthesia Quick Evaluation

## 2016-01-11 NOTE — H&P (Signed)
[name], MD [facility] [address], [place], [place] [address] Phone: [phone] Fax : [phone]  Primary Care Physician:  Vic Blackbird, MD Primary Gastroenterologist:  Dr. Allen Norris  Pre-Procedure History & Physical: HPI:  Joseph [name] is a 50 y.o. male is here for a screening colonoscopy.   Past Medical History  Diagnosis Date  . Central serous chorioretinopathy   . GERD (gastroesophageal reflux disease)   . Hypertension     improved since starting dialysis  . Pneumonia     as child  . Heart murmur     never followed up with this, no problems  . Blood clot in abdominal vein     happened around age 57 due to traumatic injury  . Dry eye   . Renal insufficiency   . Hyperlipidemia   . Blood dyscrasia     amyloidosis    Past Surgical History  Procedure Laterality Date  . Renal biopsy, percutaneous  04/06/2015       . Peripheral vascular catheterization Left 04/11/2015    Procedure: Embolization;  Surgeon: Katha Cabal, MD;  Location: [place] [facility];  Service: Cardiovascular;  Laterality: Left;  . Aortogram Bilateral 04/11/2015    Procedure: Aortogram;  Surgeon: Katha Cabal, MD;  Location: [place] [facility];  Service: Cardiovascular;  Laterality: Bilateral;  . Peripheral vascular catheterization N/A 04/16/2015    Procedure: Dialysis/Perma Catheter Insertion;  Surgeon: Katha Cabal, MD;  Location: [place] [facility];  Service: Cardiovascular;  Laterality: N/A;  . Tonsillectomy    . Implantation / placement permanent epidural catheter Right 2016  . Exploratory laparotomy      done to find and remove abdominal blood clot as a teenager  . Capd insertion N/A 07/09/2015    Procedure: LAPAROSCOPIC INSERTION CONTINUOUS AMBULATORY PERITONEAL DIALYSIS  (CAPD) CATHETER;  Surgeon: Katha Cabal, MD;  Location: [facility];  Service: Vascular;  Laterality: N/A;  . Peripheral vascular catheterization N/A 08/24/2015    Procedure: Dialysis/Perma  Catheter Removal;  Surgeon: Katha Cabal, MD;  Location: [place] [facility];  Service: Cardiovascular;  Laterality: N/A;    Prior to Admission medications   Medication Sig Start Date End Date Taking? Authorizing Provider  albuterol (PROAIR HFA) 108 (90 Base) MCG/ACT inhaler Inhale into the lungs. 09/13/14  Yes Historical Provider, MD  Artificial Tear Solution (SOOTHE XP OP) Apply 1 drop to eye as needed.   Yes Historical Provider, MD  atorvastatin (LIPITOR) 20 MG tablet Take 1 tablet (20 mg total) by mouth daily. 09/14/15  Yes Alycia Rossetti, MD  cycloSPORINE (RESTASIS) 0.05 % ophthalmic emulsion Place 1 drop into both eyes daily.    Yes Historical Provider, MD  Epoetin Alfa (EPOGEN IJ) Inject as directed every 30 (thirty) days. Administered at Dialysis center.   Yes Historical Provider, MD  folic acid (FOLVITE) 1 MG tablet Take 2 mg by mouth daily.    Yes Historical Provider, MD  Hypromellose (ARTIFICIAL TEARS OP) Apply 1 drop to eye as needed.   Yes Historical Provider, MD  losartan (COZAAR) 100 MG tablet Take 1 tablet (100 mg total) by mouth daily. Patient taking differently: Take 50 mg by mouth daily.  11/16/15  Yes Alycia Rossetti, MD  methotrexate (RHEUMATREX) 2.5 MG tablet Take 6 tablets by mouth once a week. 11/02/15  Yes Historical Provider, MD  ondansetron (ZOFRAN-ODT) 4 MG disintegrating tablet Take 1 tablet (4 mg total) by mouth every 8 (eight) hours as needed for nausea or vomiting.  04/21/15  Yes Vaughan Basta, MD  pantoprazole (PROTONIX) 40 MG tablet Take 1 tablet (40 mg total) by mouth 2 (two) times daily before a meal. Patient taking differently: Take 40 mg by mouth daily.  04/21/15  Yes Vaughan Basta, MD  triamcinolone cream (KENALOG) 0.1 % Apply 1 application topically 2 (two) times daily. 12/15/15  Yes Alycia Rossetti, MD  VELTASSA 8.4 g packet Take 1 packet by mouth daily. 12/20/15  Yes Historical Provider, MD    Allergies as of 12/02/2015 - Review  Complete 11/16/2015  Allergen Reaction Noted  . Ciprofloxacin hcl Swelling 08/28/2014  . Doxycycline hyclate Swelling 08/28/2014  . Phenol-glycerin Swelling 06/09/2015    Family History  Problem Relation Age of Onset  . Hypertension Other   . Hypertension Mother   . Birth defects Sister   . Multiple sclerosis Sister   . Heart disease Maternal Uncle   . Early death Maternal Uncle   . Heart attack Maternal Uncle   . Heart attack Maternal Uncle   . Heart disease Maternal Uncle     Social History   Social History  . Marital Status: Married    Spouse Name: N/A  . Number of Children: N/A  . Years of Education: N/A   Occupational History  . Not on file.   Social History Main Topics  . Smoking status: Former Smoker -- 0.50 packs/day for 20 years    Types: Cigarettes    Quit date: 11/04/2006  . Smokeless tobacco: Never Used  . Alcohol Use: No  . Drug Use: No  . Sexual Activity: Yes   Other Topics Concern  . Not on file   Social History Narrative    Review of Systems: See HPI, otherwise negative ROS  Physical Exam: BP 108/85 mmHg  Pulse 71  Temp(Src) 97.6 F (36.4 C) (Tympanic)  Resp 17  Ht 6' (1.829 m)  Wt 183 lb (83.008 kg)  BMI 24.81 kg/m2  SpO2 94% General:   Alert,  pleasant and cooperative in NAD Head:  Normocephalic and atraumatic. Neck:  Supple; no masses or thyromegaly. Lungs:  Clear throughout to auscultation.    Heart:  Regular rate and rhythm. Abdomen:  Soft, nontender and nondistended. Normal bowel sounds, without guarding, and without rebound.   Neurologic:  Alert and  oriented x4;  grossly normal neurologically.  Impression/Plan: Joseph [name] is now here to undergo a screening colonoscopy.  Risks, benefits, and alternatives regarding colonoscopy have been reviewed with the patient.  Questions have been answered.  All parties agreeable.

## 2016-01-11 NOTE — Transfer of Care (Signed)
Immediate Anesthesia Transfer of Care Note  Patient: Joseph [name]  Procedure(s) Performed: Procedure(s): COLONOSCOPY WITH PROPOFOL (N/A)  Patient Location: PACU  Anesthesia Type:General  Level of Consciousness: sedated  Airway & Oxygen Therapy: Patient Spontanous Breathing and Patient connected to nasal cannula oxygen  Post-op Assessment: Report given to RN and Post -op Vital signs reviewed and stable  Post vital signs: Reviewed and stable  Last Vitals:  Filed Vitals:   01/11/16 0904  BP: 108/85  Pulse: 71  Temp: 36.4 C  Resp: 17    Last Pain: There were no vitals filed for this visit.       Complications: No apparent anesthesia complications

## 2016-01-11 NOTE — Op Note (Signed)
[facility] Gastroenterology Patient Name: Joseph [name] Procedure Date: 01/11/2016 10:13 AM MRN: [id number] Account #: [id number] Date of Birth: Apr 21, 1966 Admit Type: Outpatient Age: 50 Room: [facility] ENDO ROOM 4 Gender: Male Note Status: Finalized Procedure:            Colonoscopy Indications:          Screening for colorectal malignant neoplasm Providers:            [name] MD, MD Referring MD:         Modena Nunnery. [place] (Referring MD) Medicines:            Propofol per Anesthesia Complications:        No immediate complications. Procedure:            Pre-Anesthesia Assessment:                       - Prior to the procedure, a History and Physical was                        performed, and patient medications and allergies were                        reviewed. The patient's tolerance of previous                        anesthesia was also reviewed. The risks and benefits of                        the procedure and the sedation options and risks were                        discussed with the patient. All questions were                        answered, and informed consent was obtained. Prior                        Anticoagulants: The patient has taken no previous                        anticoagulant or antiplatelet agents. ASA Grade                        Assessment: II - A patient with mild systemic disease.                        After reviewing the risks and benefits, the patient was                        deemed in satisfactory condition to undergo the                        procedure.                       After obtaining informed consent, the colonoscope was                        passed under direct vision. Throughout the procedure,  the patient's blood pressure, pulse, and oxygen                        saturations were monitored continuously. The                        Colonoscope was introduced through the anus and       advanced to the the cecum, identified by appendiceal                        orifice and ileocecal valve. The colonoscopy was                        performed without difficulty. The patient tolerated the                        procedure well. The quality of the bowel preparation                        was excellent. Findings:      The perianal and digital rectal examinations were normal.      Multiple small-mouthed diverticula were found in the sigmoid colon.      Non-bleeding internal hemorrhoids were found during retroflexion. The       hemorrhoids were Grade II (internal hemorrhoids that prolapse but reduce       spontaneously). Impression:           - Diverticulosis in the sigmoid colon.                       - Non-bleeding internal hemorrhoids.                       - No specimens collected. Recommendation:       - Repeat colonoscopy in 10 years for screening unless                        any change in family history or lower GI problems. Procedure Code(s):    --- Professional ---                       [phone], Colonoscopy, flexible; diagnostic, including                        collection of specimen(s) by brushing or washing, when                        performed (separate procedure) Diagnosis Code(s):    --- Professional ---                       Z12.11, Encounter for screening for malignant neoplasm                        of colon CPT copyright 2016 American Medical Association. All rights reserved. The codes documented in this report are preliminary and upon coder review may  be revised to meet current compliance requirements. [name] MD, MD 01/11/2016 10:30:02 AM This report has been signed electronically. Number of Addenda: 0 Note Initiated On: 01/11/2016 10:13 AM Scope Withdrawal Time: 0 hours 6 minutes 35 seconds  Total Procedure Duration: 0 hours 9 minutes  [facility]

## 2016-01-12 ENCOUNTER — Ambulatory Visit (INDEPENDENT_AMBULATORY_CARE_PROVIDER_SITE_OTHER): Payer: 59 | Admitting: Internal Medicine

## 2016-01-12 ENCOUNTER — Other Ambulatory Visit: Payer: 59

## 2016-01-12 ENCOUNTER — Encounter: Payer: Self-pay | Admitting: Internal Medicine

## 2016-01-12 VITALS — BP 122/86 | HR 72 | Ht 72.0 in | Wt 187.0 lb

## 2016-01-12 DIAGNOSIS — Z992 Dependence on renal dialysis: Secondary | ICD-10-CM | POA: Diagnosis not present

## 2016-01-12 DIAGNOSIS — N186 End stage renal disease: Secondary | ICD-10-CM | POA: Diagnosis not present

## 2016-01-12 DIAGNOSIS — J42 Unspecified chronic bronchitis: Secondary | ICD-10-CM

## 2016-01-12 NOTE — Patient Instructions (Signed)
Follow up in 6 months Transplant Follow up pending  Asthma, Acute Bronchospasm Acute bronchospasm caused by asthma is also referred to as an asthma attack. Bronchospasm means your air passages become narrowed. The narrowing is caused by inflammation and tightening of the muscles in the air tubes (bronchi) in your lungs. This can make it hard to breathe or cause you to wheeze and cough. CAUSES Possible triggers are:  Animal dander from the skin, hair, or feathers of animals.  Dust mites contained in house dust.  Cockroaches.  Pollen from trees or grass.  Mold.  Cigarette or tobacco smoke.  Air pollutants such as dust, household cleaners, hair sprays, aerosol sprays, paint fumes, strong chemicals, or strong odors.  Cold air or weather changes. Cold air may trigger inflammation. Winds increase molds and pollens in the air.  Strong emotions such as crying or laughing hard.  Stress.  Certain medicines such as aspirin or beta-blockers.  Sulfites in foods and drinks, such as dried fruits and wine.  Infections or inflammatory conditions, such as a flu, cold, or inflammation of the nasal membranes (rhinitis).  Gastroesophageal reflux disease (GERD). GERD is a condition where stomach acid backs up into your esophagus.  Exercise or strenuous activity. SIGNS AND SYMPTOMS   Wheezing.  Excessive coughing, particularly at night.  Chest tightness.  Shortness of breath. DIAGNOSIS  Your health care provider will ask you about your medical history and perform a physical exam. A chest X-ray or blood testing may be performed to look for other causes of your symptoms or other conditions that may have triggered your asthma attack. TREATMENT  Treatment is aimed at reducing inflammation and opening up the airways in your lungs. Most asthma attacks are treated with inhaled medicines. These include quick relief or rescue medicines (such as bronchodilators) and controller medicines (such as  inhaled corticosteroids). These medicines are sometimes given through an inhaler or a nebulizer. Systemic steroid medicine taken by mouth or given through an IV tube also can be used to reduce the inflammation when an attack is moderate or severe. Antibiotic medicines are only used if a bacterial infection is present.  HOME CARE INSTRUCTIONS   Rest.  Drink plenty of liquids. This helps the mucus to remain thin and be easily coughed up. Only use caffeine in moderation and do not use alcohol until you have recovered from your illness.  Do not smoke. Avoid being exposed to secondhand smoke.  You play a critical role in keeping yourself in good health. Avoid exposure to things that cause you to wheeze or to have breathing problems.  Keep your medicines up-to-date and available. Carefully follow your health care provider's treatment plan.  Take your medicine exactly as prescribed.  When pollen or pollution is bad, keep windows closed and use an air conditioner or go to places with air conditioning.  Asthma requires careful medical care. See your health care provider for a follow-up as advised. If you are more than [redacted] weeks pregnant and you were prescribed any new medicines, let your obstetrician know about the visit and how you are doing. Follow up with your health care provider as directed.  After you have recovered from your asthma attack, make an appointment with your outpatient doctor to talk about ways to reduce the likelihood of future attacks. If you do not have a doctor who manages your asthma, make an appointment with a primary care doctor to discuss your asthma. SEEK IMMEDIATE MEDICAL CARE IF:   You are getting worse.  You have trouble breathing. If severe, call your local emergency services (911 in the [place]  You develop chest pain or discomfort.  You are vomiting.  You are not able to keep fluids down.  You are coughing up yellow, green, brown, or bloody sputum.  You have a  fever and your symptoms suddenly get worse.  You have trouble swallowing. MAKE SURE YOU:   Understand these instructions.  Will watch your condition.  Will get help right away if you are not doing well or get worse.   This information is not intended to replace advice given to you by your health care provider. Make sure you discuss any questions you have with your health care provider.   Document Released: 09/27/2006 Document Revised: 06/17/2013 Document Reviewed: 12/18/2012 Elsevier Interactive Patient Education [employer].

## 2016-01-12 NOTE — Anesthesia Postprocedure Evaluation (Signed)
Anesthesia Post Note  Patient: Joseph [name]  Procedure(s) Performed: Procedure(s) (LRB): COLONOSCOPY WITH PROPOFOL (N/A)  Patient location during evaluation: PACU Anesthesia Type: General Level of consciousness: awake and alert Pain management: pain level controlled Vital Signs Assessment: post-procedure vital signs reviewed and stable Respiratory status: spontaneous breathing, nonlabored ventilation, respiratory function stable and patient connected to nasal cannula oxygen Cardiovascular status: blood pressure returned to baseline and stable Postop Assessment: no signs of nausea or vomiting Anesthetic complications: no    Last Vitals:  Filed Vitals:   01/11/16 1056 01/11/16 1106  BP: 110/83 108/80  Pulse: 62 62  Temp:    Resp: 15 11    Last Pain:  Filed Vitals:   01/11/16 1108  PainSc: Asleep                 Molli Barrows

## 2016-01-12 NOTE — Progress Notes (Signed)
[facility] Consultation     Date: 01/12/2016,   MRN# [id number] Talik Perito 01/13/1966 Code Status:  Hosp day:@LENGTHOFSTAYDAYS @ Referring MD: @ATDPROV @     PCP:      AdmissionWeight: 187 lb (84.823 kg)                 CurrentWeight: 187 lb (84.823 kg)  Synopsis:  Dx of Amyloidosis ESRD on HD with chronic cough -patient with previous h/o hemorrhagic left sided effusion with h/o heavy smoking PFT's: 07/07/15 ratio 86% FEV1 85%, 6MWT WNL DLCO 44%   CC: follow up SOB, cough  HPI:   doing well overall, uses albuterol inhaler infrequently No sob, chest pain No signs of infection at this time    Current Medication:   Current outpatient prescriptions:  .  albuterol (PROAIR HFA) 108 (90 Base) MCG/ACT inhaler, Inhale into the lungs., Disp: , Rfl:  .  Artificial Tear Solution (SOOTHE XP OP), Apply 1 drop to eye as needed., Disp: , Rfl:  .  atorvastatin (LIPITOR) 20 MG tablet, Take 1 tablet (20 mg total) by mouth daily., Disp: 90 tablet, Rfl: 3 .  cycloSPORINE (RESTASIS) 0.05 % ophthalmic emulsion, Place 1 drop into both eyes daily. , Disp: , Rfl:  .  Epoetin Alfa (EPOGEN IJ), Inject as directed every 30 (thirty) days. Administered at Dialysis center., Disp: , Rfl:  .  folic acid (FOLVITE) 1 MG tablet, Take 2 mg by mouth daily. , Disp: , Rfl:  .  gentamicin cream (GARAMYCIN) 0.1 %, , Disp: , Rfl: 0 .  Hypromellose (ARTIFICIAL TEARS OP), Apply 1 drop to eye as needed., Disp: , Rfl:  .  losartan (COZAAR) 100 MG tablet, Take 1 tablet (100 mg total) by mouth daily. (Patient taking differently: Take 50 mg by mouth daily. ), Disp: 90 tablet, Rfl: 3 .  methotrexate (RHEUMATREX) 2.5 MG tablet, Take 3 tablets by mouth once a week. , Disp: , Rfl: 3 .  ondansetron (ZOFRAN-ODT) 4 MG disintegrating tablet, Take 1 tablet (4 mg total) by mouth every 8 (eight) hours as needed for nausea or vomiting., Disp: 30 tablet, Rfl: 0 .  pantoprazole (PROTONIX) 40 MG tablet, Take 1 tablet  (40 mg total) by mouth 2 (two) times daily before a meal. (Patient taking differently: Take 40 mg by mouth daily. ), Disp: 60 tablet, Rfl: 0 .  triamcinolone cream (KENALOG) 0.1 %, Apply 1 application topically 2 (two) times daily., Disp: 30 g, Rfl: 0 .  VELTASSA 8.4 g packet, Take 1 packet by mouth daily., Disp: , Rfl: 3     ALLERGIES   Ciprofloxacin hcl; Doxycycline hyclate; and Phenol-glycerin     REVIEW OF SYSTEMS   Review of Systems  Constitutional: Negative for fever, chills, weight loss and malaise/fatigue.  Respiratory: Negative for cough, shortness of breath and wheezing.   Cardiovascular: Negative for chest pain, orthopnea and leg swelling.  Gastrointestinal: Negative for nausea and abdominal pain.     VS: BP 122/86 mmHg  Pulse 72  Ht 6' (1.829 m)  Wt 187 lb (84.823 kg)  BMI 25.36 kg/m2  SpO2 100%     PHYSICAL EXAM   Physical Exam  Constitutional: No distress.  Cardiovascular: Normal rate, regular rhythm and normal heart sounds.   No murmur heard. Pulmonary/Chest: Effort normal and breath sounds normal. No respiratory distress. He has no wheezes.  Abdominal: Soft. Bowel sounds are normal.  Surgical scars clean intact, PD catheter in place  Skin: He is not diaphoretic.  ASSESSMENT/PLAN   50 yo white male with new Dx of Amyloidosis ESRD on HD with chronic cough-resolved with h/o heavy smoking findings c/w very mild COPD based on flow volume loops with slight concavity of end exp flow limitation-no acute complaints at this time  1.albuterol as needed 2.avoid second hand smokle 3.cough suppressants as needed 4.follow up with [facility]   Follow up in 6 months   I have personally obtained a history, examined the patient, evaluated laboratory and independently reviewed imaging results,  The Patient requires high complexity decision making for assessment and support, frequent evaluation and titration of therapies, application of advanced  monitoring technologies and extensive interpretation of multiple databases. Patient/Family are satisfied with Plan of action and management. All questions answered   Corrin Parker, M.D.  [facility]  Medical Director [place] Director [facility] Cardio-Pulmonary Department

## 2016-01-13 DIAGNOSIS — Z992 Dependence on renal dialysis: Secondary | ICD-10-CM | POA: Diagnosis not present

## 2016-01-13 DIAGNOSIS — N186 End stage renal disease: Secondary | ICD-10-CM | POA: Diagnosis not present

## 2016-01-14 DIAGNOSIS — N186 End stage renal disease: Secondary | ICD-10-CM | POA: Diagnosis not present

## 2016-01-14 DIAGNOSIS — Z992 Dependence on renal dialysis: Secondary | ICD-10-CM | POA: Diagnosis not present

## 2016-01-15 DIAGNOSIS — N186 End stage renal disease: Secondary | ICD-10-CM | POA: Diagnosis not present

## 2016-01-15 DIAGNOSIS — Z992 Dependence on renal dialysis: Secondary | ICD-10-CM | POA: Diagnosis not present

## 2016-01-16 DIAGNOSIS — N186 End stage renal disease: Secondary | ICD-10-CM | POA: Diagnosis not present

## 2016-01-16 DIAGNOSIS — Z992 Dependence on renal dialysis: Secondary | ICD-10-CM | POA: Diagnosis not present

## 2016-01-17 DIAGNOSIS — Z992 Dependence on renal dialysis: Secondary | ICD-10-CM | POA: Diagnosis not present

## 2016-01-17 DIAGNOSIS — N186 End stage renal disease: Secondary | ICD-10-CM | POA: Diagnosis not present

## 2016-01-18 DIAGNOSIS — H3581 Retinal edema: Secondary | ICD-10-CM | POA: Diagnosis not present

## 2016-01-18 DIAGNOSIS — H353231 Exudative age-related macular degeneration, bilateral, with active choroidal neovascularization: Secondary | ICD-10-CM | POA: Diagnosis not present

## 2016-01-18 DIAGNOSIS — Z992 Dependence on renal dialysis: Secondary | ICD-10-CM | POA: Diagnosis not present

## 2016-01-18 DIAGNOSIS — Z79899 Other long term (current) drug therapy: Secondary | ICD-10-CM | POA: Diagnosis not present

## 2016-01-18 DIAGNOSIS — H2513 Age-related nuclear cataract, bilateral: Secondary | ICD-10-CM | POA: Diagnosis not present

## 2016-01-18 DIAGNOSIS — N186 End stage renal disease: Secondary | ICD-10-CM | POA: Diagnosis not present

## 2016-01-18 DIAGNOSIS — H35713 Central serous chorioretinopathy, bilateral: Secondary | ICD-10-CM | POA: Diagnosis not present

## 2016-01-19 ENCOUNTER — Ambulatory Visit: Payer: 59 | Admitting: Oncology

## 2016-01-19 DIAGNOSIS — Z992 Dependence on renal dialysis: Secondary | ICD-10-CM | POA: Diagnosis not present

## 2016-01-19 DIAGNOSIS — N186 End stage renal disease: Secondary | ICD-10-CM | POA: Diagnosis not present

## 2016-01-20 DIAGNOSIS — N186 End stage renal disease: Secondary | ICD-10-CM | POA: Diagnosis not present

## 2016-01-20 DIAGNOSIS — Z992 Dependence on renal dialysis: Secondary | ICD-10-CM | POA: Diagnosis not present

## 2016-01-21 DIAGNOSIS — Z992 Dependence on renal dialysis: Secondary | ICD-10-CM | POA: Diagnosis not present

## 2016-01-21 DIAGNOSIS — N186 End stage renal disease: Secondary | ICD-10-CM | POA: Diagnosis not present

## 2016-01-22 DIAGNOSIS — N186 End stage renal disease: Secondary | ICD-10-CM | POA: Diagnosis not present

## 2016-01-22 DIAGNOSIS — Z992 Dependence on renal dialysis: Secondary | ICD-10-CM | POA: Diagnosis not present

## 2016-01-23 DIAGNOSIS — Z992 Dependence on renal dialysis: Secondary | ICD-10-CM | POA: Diagnosis not present

## 2016-01-23 DIAGNOSIS — N186 End stage renal disease: Secondary | ICD-10-CM | POA: Diagnosis not present

## 2016-01-24 DIAGNOSIS — Z992 Dependence on renal dialysis: Secondary | ICD-10-CM | POA: Diagnosis not present

## 2016-01-24 DIAGNOSIS — N186 End stage renal disease: Secondary | ICD-10-CM | POA: Diagnosis not present

## 2016-01-25 DIAGNOSIS — Z992 Dependence on renal dialysis: Secondary | ICD-10-CM | POA: Diagnosis not present

## 2016-01-25 DIAGNOSIS — N186 End stage renal disease: Secondary | ICD-10-CM | POA: Diagnosis not present

## 2016-01-26 DIAGNOSIS — Z992 Dependence on renal dialysis: Secondary | ICD-10-CM | POA: Diagnosis not present

## 2016-01-26 DIAGNOSIS — N186 End stage renal disease: Secondary | ICD-10-CM | POA: Diagnosis not present

## 2016-01-27 DIAGNOSIS — Z992 Dependence on renal dialysis: Secondary | ICD-10-CM | POA: Diagnosis not present

## 2016-01-27 DIAGNOSIS — N186 End stage renal disease: Secondary | ICD-10-CM | POA: Diagnosis not present

## 2016-01-28 DIAGNOSIS — N186 End stage renal disease: Secondary | ICD-10-CM | POA: Diagnosis not present

## 2016-01-28 DIAGNOSIS — Z992 Dependence on renal dialysis: Secondary | ICD-10-CM | POA: Diagnosis not present

## 2016-01-29 DIAGNOSIS — N186 End stage renal disease: Secondary | ICD-10-CM | POA: Diagnosis not present

## 2016-01-29 DIAGNOSIS — Z992 Dependence on renal dialysis: Secondary | ICD-10-CM | POA: Diagnosis not present

## 2016-01-30 DIAGNOSIS — Z992 Dependence on renal dialysis: Secondary | ICD-10-CM | POA: Diagnosis not present

## 2016-01-30 DIAGNOSIS — N186 End stage renal disease: Secondary | ICD-10-CM | POA: Diagnosis not present

## 2016-01-31 ENCOUNTER — Encounter: Payer: Self-pay | Admitting: Family Medicine

## 2016-01-31 DIAGNOSIS — Z992 Dependence on renal dialysis: Secondary | ICD-10-CM | POA: Diagnosis not present

## 2016-01-31 DIAGNOSIS — N186 End stage renal disease: Secondary | ICD-10-CM | POA: Diagnosis not present

## 2016-02-01 ENCOUNTER — Ambulatory Visit (INDEPENDENT_AMBULATORY_CARE_PROVIDER_SITE_OTHER): Payer: 59 | Admitting: Family Medicine

## 2016-02-01 DIAGNOSIS — Z992 Dependence on renal dialysis: Secondary | ICD-10-CM | POA: Diagnosis not present

## 2016-02-01 DIAGNOSIS — N186 End stage renal disease: Secondary | ICD-10-CM | POA: Diagnosis not present

## 2016-02-01 DIAGNOSIS — Z23 Encounter for immunization: Secondary | ICD-10-CM | POA: Diagnosis not present

## 2016-02-02 DIAGNOSIS — N186 End stage renal disease: Secondary | ICD-10-CM | POA: Diagnosis not present

## 2016-02-02 DIAGNOSIS — Z992 Dependence on renal dialysis: Secondary | ICD-10-CM | POA: Diagnosis not present

## 2016-02-03 DIAGNOSIS — N186 End stage renal disease: Secondary | ICD-10-CM | POA: Diagnosis not present

## 2016-02-03 DIAGNOSIS — Z992 Dependence on renal dialysis: Secondary | ICD-10-CM | POA: Diagnosis not present

## 2016-02-04 DIAGNOSIS — Z992 Dependence on renal dialysis: Secondary | ICD-10-CM | POA: Diagnosis not present

## 2016-02-04 DIAGNOSIS — N186 End stage renal disease: Secondary | ICD-10-CM | POA: Diagnosis not present

## 2016-02-05 DIAGNOSIS — Z992 Dependence on renal dialysis: Secondary | ICD-10-CM | POA: Diagnosis not present

## 2016-02-05 DIAGNOSIS — N186 End stage renal disease: Secondary | ICD-10-CM | POA: Diagnosis not present

## 2016-02-06 DIAGNOSIS — Z992 Dependence on renal dialysis: Secondary | ICD-10-CM | POA: Diagnosis not present

## 2016-02-06 DIAGNOSIS — N186 End stage renal disease: Secondary | ICD-10-CM | POA: Diagnosis not present

## 2016-02-07 DIAGNOSIS — Z992 Dependence on renal dialysis: Secondary | ICD-10-CM | POA: Diagnosis not present

## 2016-02-07 DIAGNOSIS — N186 End stage renal disease: Secondary | ICD-10-CM | POA: Diagnosis not present

## 2016-02-08 DIAGNOSIS — N186 End stage renal disease: Secondary | ICD-10-CM | POA: Diagnosis not present

## 2016-02-08 DIAGNOSIS — Z992 Dependence on renal dialysis: Secondary | ICD-10-CM | POA: Diagnosis not present

## 2016-02-09 DIAGNOSIS — Z992 Dependence on renal dialysis: Secondary | ICD-10-CM | POA: Diagnosis not present

## 2016-02-09 DIAGNOSIS — N186 End stage renal disease: Secondary | ICD-10-CM | POA: Diagnosis not present

## 2016-02-10 DIAGNOSIS — Z992 Dependence on renal dialysis: Secondary | ICD-10-CM | POA: Diagnosis not present

## 2016-02-10 DIAGNOSIS — N186 End stage renal disease: Secondary | ICD-10-CM | POA: Diagnosis not present

## 2016-02-11 DIAGNOSIS — Z992 Dependence on renal dialysis: Secondary | ICD-10-CM | POA: Diagnosis not present

## 2016-02-11 DIAGNOSIS — N186 End stage renal disease: Secondary | ICD-10-CM | POA: Diagnosis not present

## 2016-02-12 DIAGNOSIS — Z992 Dependence on renal dialysis: Secondary | ICD-10-CM | POA: Diagnosis not present

## 2016-02-12 DIAGNOSIS — N186 End stage renal disease: Secondary | ICD-10-CM | POA: Diagnosis not present

## 2016-02-13 DIAGNOSIS — N186 End stage renal disease: Secondary | ICD-10-CM | POA: Diagnosis not present

## 2016-02-13 DIAGNOSIS — Z992 Dependence on renal dialysis: Secondary | ICD-10-CM | POA: Diagnosis not present

## 2016-02-14 DIAGNOSIS — Z992 Dependence on renal dialysis: Secondary | ICD-10-CM | POA: Diagnosis not present

## 2016-02-14 DIAGNOSIS — N186 End stage renal disease: Secondary | ICD-10-CM | POA: Diagnosis not present

## 2016-02-15 DIAGNOSIS — N186 End stage renal disease: Secondary | ICD-10-CM | POA: Diagnosis not present

## 2016-02-15 DIAGNOSIS — Z992 Dependence on renal dialysis: Secondary | ICD-10-CM | POA: Diagnosis not present

## 2016-02-16 DIAGNOSIS — N186 End stage renal disease: Secondary | ICD-10-CM | POA: Diagnosis not present

## 2016-02-16 DIAGNOSIS — Z992 Dependence on renal dialysis: Secondary | ICD-10-CM | POA: Diagnosis not present

## 2016-02-17 DIAGNOSIS — R5383 Other fatigue: Secondary | ICD-10-CM | POA: Diagnosis not present

## 2016-02-17 DIAGNOSIS — I517 Cardiomegaly: Secondary | ICD-10-CM | POA: Diagnosis not present

## 2016-02-17 DIAGNOSIS — N186 End stage renal disease: Secondary | ICD-10-CM | POA: Diagnosis not present

## 2016-02-17 DIAGNOSIS — Z01810 Encounter for preprocedural cardiovascular examination: Secondary | ICD-10-CM | POA: Diagnosis not present

## 2016-02-17 DIAGNOSIS — I120 Hypertensive chronic kidney disease with stage 5 chronic kidney disease or end stage renal disease: Secondary | ICD-10-CM | POA: Diagnosis not present

## 2016-02-17 DIAGNOSIS — I371 Nonrheumatic pulmonary valve insufficiency: Secondary | ICD-10-CM | POA: Diagnosis not present

## 2016-02-17 DIAGNOSIS — E859 Amyloidosis, unspecified: Secondary | ICD-10-CM | POA: Diagnosis not present

## 2016-02-17 DIAGNOSIS — Z7682 Awaiting organ transplant status: Secondary | ICD-10-CM | POA: Diagnosis not present

## 2016-02-17 DIAGNOSIS — Z992 Dependence on renal dialysis: Secondary | ICD-10-CM | POA: Diagnosis not present

## 2016-02-18 DIAGNOSIS — Z992 Dependence on renal dialysis: Secondary | ICD-10-CM | POA: Diagnosis not present

## 2016-02-18 DIAGNOSIS — N186 End stage renal disease: Secondary | ICD-10-CM | POA: Diagnosis not present

## 2016-02-19 DIAGNOSIS — N186 End stage renal disease: Secondary | ICD-10-CM | POA: Diagnosis not present

## 2016-02-19 DIAGNOSIS — Z992 Dependence on renal dialysis: Secondary | ICD-10-CM | POA: Diagnosis not present

## 2016-02-20 DIAGNOSIS — Z992 Dependence on renal dialysis: Secondary | ICD-10-CM | POA: Diagnosis not present

## 2016-02-20 DIAGNOSIS — N186 End stage renal disease: Secondary | ICD-10-CM | POA: Diagnosis not present

## 2016-02-21 DIAGNOSIS — Z992 Dependence on renal dialysis: Secondary | ICD-10-CM | POA: Diagnosis not present

## 2016-02-21 DIAGNOSIS — N186 End stage renal disease: Secondary | ICD-10-CM | POA: Diagnosis not present

## 2016-02-22 DIAGNOSIS — N186 End stage renal disease: Secondary | ICD-10-CM | POA: Diagnosis not present

## 2016-02-22 DIAGNOSIS — Z992 Dependence on renal dialysis: Secondary | ICD-10-CM | POA: Diagnosis not present

## 2016-02-23 DIAGNOSIS — N186 End stage renal disease: Secondary | ICD-10-CM | POA: Diagnosis not present

## 2016-02-23 DIAGNOSIS — Z992 Dependence on renal dialysis: Secondary | ICD-10-CM | POA: Diagnosis not present

## 2016-02-24 DIAGNOSIS — Z992 Dependence on renal dialysis: Secondary | ICD-10-CM | POA: Diagnosis not present

## 2016-02-24 DIAGNOSIS — N186 End stage renal disease: Secondary | ICD-10-CM | POA: Diagnosis not present

## 2016-02-25 DIAGNOSIS — Z992 Dependence on renal dialysis: Secondary | ICD-10-CM | POA: Diagnosis not present

## 2016-02-25 DIAGNOSIS — N186 End stage renal disease: Secondary | ICD-10-CM | POA: Diagnosis not present

## 2016-02-26 DIAGNOSIS — Z992 Dependence on renal dialysis: Secondary | ICD-10-CM | POA: Diagnosis not present

## 2016-02-26 DIAGNOSIS — N186 End stage renal disease: Secondary | ICD-10-CM | POA: Diagnosis not present

## 2016-02-27 DIAGNOSIS — Z992 Dependence on renal dialysis: Secondary | ICD-10-CM | POA: Diagnosis not present

## 2016-02-27 DIAGNOSIS — N186 End stage renal disease: Secondary | ICD-10-CM | POA: Diagnosis not present

## 2016-02-28 DIAGNOSIS — Z992 Dependence on renal dialysis: Secondary | ICD-10-CM | POA: Diagnosis not present

## 2016-02-28 DIAGNOSIS — N186 End stage renal disease: Secondary | ICD-10-CM | POA: Diagnosis not present

## 2016-02-29 DIAGNOSIS — N186 End stage renal disease: Secondary | ICD-10-CM | POA: Diagnosis not present

## 2016-02-29 DIAGNOSIS — R944 Abnormal results of kidney function studies: Secondary | ICD-10-CM | POA: Diagnosis not present

## 2016-02-29 DIAGNOSIS — Z992 Dependence on renal dialysis: Secondary | ICD-10-CM | POA: Diagnosis not present

## 2016-03-01 ENCOUNTER — Ambulatory Visit (INDEPENDENT_AMBULATORY_CARE_PROVIDER_SITE_OTHER): Payer: Medicare Other | Admitting: Family Medicine

## 2016-03-01 ENCOUNTER — Encounter: Payer: Self-pay | Admitting: Family Medicine

## 2016-03-01 VITALS — BP 120/68 | HR 82 | Temp 98.2°F | Resp 12 | Ht 72.0 in | Wt 189.0 lb

## 2016-03-01 DIAGNOSIS — Z992 Dependence on renal dialysis: Secondary | ICD-10-CM | POA: Diagnosis not present

## 2016-03-01 DIAGNOSIS — B359 Dermatophytosis, unspecified: Secondary | ICD-10-CM | POA: Diagnosis not present

## 2016-03-01 DIAGNOSIS — N186 End stage renal disease: Secondary | ICD-10-CM | POA: Diagnosis not present

## 2016-03-01 DIAGNOSIS — R21 Rash and other nonspecific skin eruption: Secondary | ICD-10-CM | POA: Diagnosis not present

## 2016-03-01 NOTE — Progress Notes (Signed)
   Subjective:    Patient ID: Joseph [name], male    DOB: 1965-06-30, 50 y.o.   MRN: [id number]  Patient presents for Rash (irritation to back and back of thighs)   Patient here with worsening rash on his lower back posterior aspect of his eyes in his lower legs. He initially had some hypopigmented and scattered erythematous lesions when I saw him back in June but this is worsened. He states that it seems to worsen when his Epogen dose was increased. He's also had a dry cough that occur when his Epogen dose was increased. He denies any itching of the lesions they're not painful which is increasing.    He was seen by the cardiology to be clear for the transplant list. He had a repeat echo that did not show any evidence of amyloidosis once again they're questioning his actual diagnosis     Review Of Systems:  GEN- denies fatigue, fever, weight loss,weakness, recent illness HEENT- denies eye drainage, change in vision, nasal discharge, CVS- denies chest pain, palpitations RESP- denies SOB, cough, wheeze MSK- denies joint pain, muscle aches, injury Neuro- denies headache, dizziness, syncope, seizure activity       Objective:    BP 120/68 (BP Location: Right Arm, Patient Position: Sitting, Cuff Size: Large)   Pulse 82   Temp 98.2 F (36.8 C) (Oral)   Resp 12   Ht 6' (1.829 m)   Wt 189 lb (85.7 kg)   BMI 25.63 kg/m  GEN- NAD, alert and oriented x3 HEENT- PERRL, EOMI, non injected sclera, pink conjunctiva, MMM, oropharynx clear CVS- RRR, no murmur RESP-CTAB Skin-  Small erythematous maculopapular  Lesions, occasional pustular lesion seen and scabs on buttocks, bilat thighs, few lesions lower back,, hyperpigmented macules scattered on arms/ lower legs ,  EXT- No edema Pulses- Radial, 2+   Procedure- Punch Biopsy - Left Post Thigh  Procedure explained to patient questions answered benefits and risks discussed verbal consent obtained. Antiseptic-Betadine Anesthesia-lidocaine 2% with  Epi 40mm punch biopsy obtained Minimal blood loss, steri strip placed Patient tolerated procedure well Bandage applied       Assessment & Plan:      Problem List Items Addressed This Visit    None    Visit Diagnoses    Rash and nonspecific skin eruption    -  Primary   Unclear cause, may be drug related, no sign of scabies or insect bites, no currently illness, ? cutaneous amyloid reaction biopsy done, treatment pending results   Relevant Orders   Pathology Report      Note: This dictation was prepared with Dragon dictation along with smaller phrase technology. Any transcriptional errors that result from this process are unintentional.

## 2016-03-01 NOTE — Patient Instructions (Addendum)
F/U pending biopsy results

## 2016-03-02 DIAGNOSIS — N186 End stage renal disease: Secondary | ICD-10-CM | POA: Diagnosis not present

## 2016-03-02 DIAGNOSIS — Z992 Dependence on renal dialysis: Secondary | ICD-10-CM | POA: Diagnosis not present

## 2016-03-03 DIAGNOSIS — N186 End stage renal disease: Secondary | ICD-10-CM | POA: Diagnosis not present

## 2016-03-03 DIAGNOSIS — Z23 Encounter for immunization: Secondary | ICD-10-CM | POA: Diagnosis not present

## 2016-03-03 DIAGNOSIS — Z992 Dependence on renal dialysis: Secondary | ICD-10-CM | POA: Diagnosis not present

## 2016-03-03 LAB — PATHOLOGY

## 2016-03-04 DIAGNOSIS — Z992 Dependence on renal dialysis: Secondary | ICD-10-CM | POA: Diagnosis not present

## 2016-03-04 DIAGNOSIS — Z23 Encounter for immunization: Secondary | ICD-10-CM | POA: Diagnosis not present

## 2016-03-04 DIAGNOSIS — N186 End stage renal disease: Secondary | ICD-10-CM | POA: Diagnosis not present

## 2016-03-05 DIAGNOSIS — N186 End stage renal disease: Secondary | ICD-10-CM | POA: Diagnosis not present

## 2016-03-05 DIAGNOSIS — Z23 Encounter for immunization: Secondary | ICD-10-CM | POA: Diagnosis not present

## 2016-03-05 DIAGNOSIS — Z992 Dependence on renal dialysis: Secondary | ICD-10-CM | POA: Diagnosis not present

## 2016-03-06 DIAGNOSIS — Z992 Dependence on renal dialysis: Secondary | ICD-10-CM | POA: Diagnosis not present

## 2016-03-06 DIAGNOSIS — N186 End stage renal disease: Secondary | ICD-10-CM | POA: Diagnosis not present

## 2016-03-06 DIAGNOSIS — Z23 Encounter for immunization: Secondary | ICD-10-CM | POA: Diagnosis not present

## 2016-03-07 DIAGNOSIS — N186 End stage renal disease: Secondary | ICD-10-CM | POA: Diagnosis not present

## 2016-03-07 DIAGNOSIS — Z23 Encounter for immunization: Secondary | ICD-10-CM | POA: Diagnosis not present

## 2016-03-07 DIAGNOSIS — Z992 Dependence on renal dialysis: Secondary | ICD-10-CM | POA: Diagnosis not present

## 2016-03-08 DIAGNOSIS — Z992 Dependence on renal dialysis: Secondary | ICD-10-CM | POA: Diagnosis not present

## 2016-03-08 DIAGNOSIS — N186 End stage renal disease: Secondary | ICD-10-CM | POA: Diagnosis not present

## 2016-03-08 DIAGNOSIS — Z23 Encounter for immunization: Secondary | ICD-10-CM | POA: Diagnosis not present

## 2016-03-09 ENCOUNTER — Telehealth: Payer: Self-pay | Admitting: *Deleted

## 2016-03-09 DIAGNOSIS — N186 End stage renal disease: Secondary | ICD-10-CM | POA: Diagnosis not present

## 2016-03-09 DIAGNOSIS — Z992 Dependence on renal dialysis: Secondary | ICD-10-CM | POA: Diagnosis not present

## 2016-03-09 DIAGNOSIS — Z23 Encounter for immunization: Secondary | ICD-10-CM | POA: Diagnosis not present

## 2016-03-09 MED ORDER — FLUCONAZOLE 150 MG PO TABS: 150.0000 mg | ORAL_TABLET | ORAL | 0 refills | Status: DC

## 2016-03-09 NOTE — Telephone Encounter (Signed)
dilfucan 150mg , this was error q weekly

## 2016-03-09 NOTE — Telephone Encounter (Signed)
Received call from patient.   Reports that prescription has not been sent to the pharmacy for fungal infection.  Per pathology report, MD ordered ketoconazole 150mg  q weekly for 4 weeks.  Ketoconazole is not available in 150mg . Closest dose is 200mg .   Fluconazole (Diflucan) is available in 150mg  dose.   Please advise.

## 2016-03-10 DIAGNOSIS — N186 End stage renal disease: Secondary | ICD-10-CM | POA: Diagnosis not present

## 2016-03-10 DIAGNOSIS — Z992 Dependence on renal dialysis: Secondary | ICD-10-CM | POA: Diagnosis not present

## 2016-03-11 DIAGNOSIS — N186 End stage renal disease: Secondary | ICD-10-CM | POA: Diagnosis not present

## 2016-03-11 DIAGNOSIS — Z992 Dependence on renal dialysis: Secondary | ICD-10-CM | POA: Diagnosis not present

## 2016-03-12 DIAGNOSIS — Z992 Dependence on renal dialysis: Secondary | ICD-10-CM | POA: Diagnosis not present

## 2016-03-12 DIAGNOSIS — N186 End stage renal disease: Secondary | ICD-10-CM | POA: Diagnosis not present

## 2016-03-13 DIAGNOSIS — N186 End stage renal disease: Secondary | ICD-10-CM | POA: Diagnosis not present

## 2016-03-13 DIAGNOSIS — Z992 Dependence on renal dialysis: Secondary | ICD-10-CM | POA: Diagnosis not present

## 2016-03-14 DIAGNOSIS — H35713 Central serous chorioretinopathy, bilateral: Secondary | ICD-10-CM | POA: Diagnosis not present

## 2016-03-14 DIAGNOSIS — H353231 Exudative age-related macular degeneration, bilateral, with active choroidal neovascularization: Secondary | ICD-10-CM | POA: Diagnosis not present

## 2016-03-14 DIAGNOSIS — Z992 Dependence on renal dialysis: Secondary | ICD-10-CM | POA: Diagnosis not present

## 2016-03-14 DIAGNOSIS — N186 End stage renal disease: Secondary | ICD-10-CM | POA: Diagnosis not present

## 2016-03-14 DIAGNOSIS — H3581 Retinal edema: Secondary | ICD-10-CM | POA: Diagnosis not present

## 2016-03-14 DIAGNOSIS — H2513 Age-related nuclear cataract, bilateral: Secondary | ICD-10-CM | POA: Diagnosis not present

## 2016-03-14 DIAGNOSIS — Z79899 Other long term (current) drug therapy: Secondary | ICD-10-CM | POA: Diagnosis not present

## 2016-03-15 DIAGNOSIS — Z992 Dependence on renal dialysis: Secondary | ICD-10-CM | POA: Diagnosis not present

## 2016-03-15 DIAGNOSIS — N186 End stage renal disease: Secondary | ICD-10-CM | POA: Diagnosis not present

## 2016-03-16 DIAGNOSIS — Z992 Dependence on renal dialysis: Secondary | ICD-10-CM | POA: Diagnosis not present

## 2016-03-16 DIAGNOSIS — N186 End stage renal disease: Secondary | ICD-10-CM | POA: Diagnosis not present

## 2016-03-17 DIAGNOSIS — N186 End stage renal disease: Secondary | ICD-10-CM | POA: Diagnosis not present

## 2016-03-17 DIAGNOSIS — Z992 Dependence on renal dialysis: Secondary | ICD-10-CM | POA: Diagnosis not present

## 2016-03-18 DIAGNOSIS — Z992 Dependence on renal dialysis: Secondary | ICD-10-CM | POA: Diagnosis not present

## 2016-03-18 DIAGNOSIS — N186 End stage renal disease: Secondary | ICD-10-CM | POA: Diagnosis not present

## 2016-03-19 DIAGNOSIS — N186 End stage renal disease: Secondary | ICD-10-CM | POA: Diagnosis not present

## 2016-03-19 DIAGNOSIS — Z992 Dependence on renal dialysis: Secondary | ICD-10-CM | POA: Diagnosis not present

## 2016-03-20 DIAGNOSIS — N186 End stage renal disease: Secondary | ICD-10-CM | POA: Diagnosis not present

## 2016-03-20 DIAGNOSIS — Z992 Dependence on renal dialysis: Secondary | ICD-10-CM | POA: Diagnosis not present

## 2016-03-21 DIAGNOSIS — Z992 Dependence on renal dialysis: Secondary | ICD-10-CM | POA: Diagnosis not present

## 2016-03-21 DIAGNOSIS — N186 End stage renal disease: Secondary | ICD-10-CM | POA: Diagnosis not present

## 2016-03-22 DIAGNOSIS — Z992 Dependence on renal dialysis: Secondary | ICD-10-CM | POA: Diagnosis not present

## 2016-03-22 DIAGNOSIS — N186 End stage renal disease: Secondary | ICD-10-CM | POA: Diagnosis not present

## 2016-03-23 ENCOUNTER — Telehealth: Payer: Self-pay | Admitting: *Deleted

## 2016-03-23 DIAGNOSIS — Z7682 Awaiting organ transplant status: Secondary | ICD-10-CM

## 2016-03-23 DIAGNOSIS — N186 End stage renal disease: Secondary | ICD-10-CM | POA: Diagnosis not present

## 2016-03-23 DIAGNOSIS — Z992 Dependence on renal dialysis: Secondary | ICD-10-CM | POA: Diagnosis not present

## 2016-03-23 NOTE — Telephone Encounter (Signed)
Received call from patient.   Inquired as to if MD has received orders from [facility] for liver function tests in regards to kidney transplant.   MD please advise.

## 2016-03-24 DIAGNOSIS — N186 End stage renal disease: Secondary | ICD-10-CM | POA: Diagnosis not present

## 2016-03-24 DIAGNOSIS — Z992 Dependence on renal dialysis: Secondary | ICD-10-CM | POA: Diagnosis not present

## 2016-03-24 NOTE — Telephone Encounter (Signed)
Call placed to patient.   Advised that no orders noted in office.   Patient given our contact information to have [facility] re-send orders.

## 2016-03-24 NOTE — Telephone Encounter (Signed)
I don't have any orders.

## 2016-03-25 DIAGNOSIS — Z992 Dependence on renal dialysis: Secondary | ICD-10-CM | POA: Diagnosis not present

## 2016-03-25 DIAGNOSIS — N186 End stage renal disease: Secondary | ICD-10-CM | POA: Diagnosis not present

## 2016-03-26 DIAGNOSIS — N186 End stage renal disease: Secondary | ICD-10-CM | POA: Diagnosis not present

## 2016-03-26 DIAGNOSIS — Z992 Dependence on renal dialysis: Secondary | ICD-10-CM | POA: Diagnosis not present

## 2016-03-27 DIAGNOSIS — Z992 Dependence on renal dialysis: Secondary | ICD-10-CM | POA: Diagnosis not present

## 2016-03-27 DIAGNOSIS — N186 End stage renal disease: Secondary | ICD-10-CM | POA: Diagnosis not present

## 2016-03-27 NOTE — Telephone Encounter (Signed)
Received fax from [facility].   Lab order includes Hepatic Function Panel. Dx: Awaiting Organ Transplant (Y34.62).   MD made aware and agreed to lab.   Future order placed.   Call placed to patient and patient made aware.   Will fax results to Dr. Altamease Oiler at [facility] [phone]~telephone/ [phone]~ fax

## 2016-03-27 NOTE — Addendum Note (Signed)
Addended by: Sheral Flow on: 03/27/2016 05:04 PM   Modules accepted: Orders

## 2016-03-28 ENCOUNTER — Other Ambulatory Visit: Payer: Medicare Other

## 2016-03-28 DIAGNOSIS — Z7682 Awaiting organ transplant status: Secondary | ICD-10-CM | POA: Diagnosis not present

## 2016-03-28 DIAGNOSIS — N186 End stage renal disease: Secondary | ICD-10-CM | POA: Diagnosis not present

## 2016-03-28 DIAGNOSIS — Z992 Dependence on renal dialysis: Secondary | ICD-10-CM | POA: Diagnosis not present

## 2016-03-29 DIAGNOSIS — N186 End stage renal disease: Secondary | ICD-10-CM | POA: Diagnosis not present

## 2016-03-29 DIAGNOSIS — Z992 Dependence on renal dialysis: Secondary | ICD-10-CM | POA: Diagnosis not present

## 2016-03-29 LAB — HEPATIC FUNCTION PANEL
ALK PHOS: 131 U/L — AB (ref 40–115)
ALT: 44 U/L (ref 9–46)
AST: 26 U/L (ref 10–35)
Albumin: 3.5 g/dL — ABNORMAL LOW (ref 3.6–5.1)
BILIRUBIN INDIRECT: 0.3 mg/dL (ref 0.2–1.2)
BILIRUBIN TOTAL: 0.4 mg/dL (ref 0.2–1.2)
Bilirubin, Direct: 0.1 mg/dL (ref <=0.2)
Total Protein: 6.3 g/dL (ref 6.1–8.1)

## 2016-03-30 DIAGNOSIS — E7800 Pure hypercholesterolemia, unspecified: Secondary | ICD-10-CM | POA: Diagnosis not present

## 2016-03-30 DIAGNOSIS — N186 End stage renal disease: Secondary | ICD-10-CM | POA: Diagnosis not present

## 2016-03-30 DIAGNOSIS — Z992 Dependence on renal dialysis: Secondary | ICD-10-CM | POA: Diagnosis not present

## 2016-03-31 DIAGNOSIS — Z992 Dependence on renal dialysis: Secondary | ICD-10-CM | POA: Diagnosis not present

## 2016-03-31 DIAGNOSIS — N186 End stage renal disease: Secondary | ICD-10-CM | POA: Diagnosis not present

## 2016-04-01 DIAGNOSIS — Z992 Dependence on renal dialysis: Secondary | ICD-10-CM | POA: Diagnosis not present

## 2016-04-01 DIAGNOSIS — N186 End stage renal disease: Secondary | ICD-10-CM | POA: Diagnosis not present

## 2016-04-02 DIAGNOSIS — Z992 Dependence on renal dialysis: Secondary | ICD-10-CM | POA: Diagnosis not present

## 2016-04-02 DIAGNOSIS — N186 End stage renal disease: Secondary | ICD-10-CM | POA: Diagnosis not present

## 2016-04-02 NOTE — Progress Notes (Signed)
[place]  [phone] [phone]) [phone]  ID: Berish Bohman OB: May 31, 1966  MR#: [id number]  MWN#:[id number]  Patient Care Team: Alycia Rossetti, MD as PCP - General (Family Medicine)  CHIEF COMPLAINT: Primary renal amyloidosis  INTERVAL HISTORY: Patient returns to clinic today for further evaluation. He is currently being worked up for the renal transplant list. Once approved it could take 3-5 years. He is tolerating dialysis well.  He has some fatigue and he has a decreased appetite but no weight loss.  He has no neurologic complaints. He denies any fevers. He denies any chest pain or shortness of breath. He has no abdominal pain. He denies any nausea, vomiting, constipation, or diarrhea. Patient offers no specific complaints today.  REVIEW OF SYSTEMS:   Review of Systems  Constitutional: Negative for fever, malaise/fatigue and weight loss.  Respiratory: Negative.  Negative for cough and shortness of breath.   Cardiovascular: Negative.  Negative for chest pain and leg swelling.  Gastrointestinal: Negative.  Negative for abdominal pain.  Genitourinary: Negative.   Musculoskeletal: Negative.   Neurological: Negative.  Negative for weakness.  Endo/Heme/Allergies: Does not bruise/bleed easily.  Psychiatric/Behavioral: Negative.  The patient is not nervous/anxious.     As per HPI. Otherwise, a complete review of systems is negative.  PAST MEDICAL HISTORY: Past Medical History:  Diagnosis Date  . Blood clot in abdominal vein    happened around age 43 due to traumatic injury  . Blood dyscrasia    amyloidosis  . Central serous chorioretinopathy   . Dry eye   . GERD (gastroesophageal reflux disease)   . Heart murmur    never followed up with this, no problems  . Hyperlipidemia   . Hypertension    improved since starting dialysis  . Pneumonia    as child  . Renal insufficiency     PAST SURGICAL HISTORY: Past Surgical History:  Procedure  Laterality Date  . AORTOGRAM Bilateral 04/11/2015   Procedure: Aortogram;  Surgeon: Katha Cabal, MD;  Location: [place] [facility];  Service: Cardiovascular;  Laterality: Bilateral;  . CAPD INSERTION N/A 07/09/2015   Procedure: LAPAROSCOPIC INSERTION CONTINUOUS AMBULATORY PERITONEAL DIALYSIS  (CAPD) CATHETER;  Surgeon: Katha Cabal, MD;  Location: [facility];  Service: Vascular;  Laterality: N/A;  . COLONOSCOPY WITH PROPOFOL N/A 01/11/2016   Procedure: COLONOSCOPY WITH PROPOFOL;  Surgeon: [name], MD;  Location: [facility];  Service: Endoscopy;  Laterality: N/A;  . EXPLORATORY LAPAROTOMY     done to find and remove abdominal blood clot as a teenager  . IMPLANTATION / PLACEMENT PERMANENT EPIDURAL CATHETER Right 2016  . PERIPHERAL VASCULAR CATHETERIZATION Left 04/11/2015   Procedure: Embolization;  Surgeon: Katha Cabal, MD;  Location: [place] [facility];  Service: Cardiovascular;  Laterality: Left;  . PERIPHERAL VASCULAR CATHETERIZATION N/A 04/16/2015   Procedure: Dialysis/Perma Catheter Insertion;  Surgeon: Katha Cabal, MD;  Location: [place] [facility];  Service: Cardiovascular;  Laterality: N/A;  . PERIPHERAL VASCULAR CATHETERIZATION N/A 08/24/2015   Procedure: Dialysis/Perma Catheter Removal;  Surgeon: Katha Cabal, MD;  Location: [place] [facility];  Service: Cardiovascular;  Laterality: N/A;  . RENAL BIOPSY, PERCUTANEOUS  04/06/2015      . TONSILLECTOMY      FAMILY HISTORY Family History  Problem Relation Age of Onset  . Hypertension Mother   . Birth defects Sister   . Multiple sclerosis Sister   . Heart disease Maternal Uncle   . Early death Maternal Uncle   .  Heart attack Maternal Uncle   . Hypertension Other   . Heart attack Maternal Uncle   . Heart disease Maternal Uncle        ADVANCED DIRECTIVES:    HEALTH MAINTENANCE: Social History  Substance Use Topics  . Smoking status: Former Smoker    Packs/day: 0.50    Years: 20.00     Types: Cigarettes    Quit date: 11/04/2006  . Smokeless tobacco: Never Used  . Alcohol use No    Allergies  Allergen Reactions  . Ciprofloxacin Hcl Swelling    High fever  . Doxycycline Hyclate Swelling  . Phenol-Glycerin Swelling    Current Outpatient Prescriptions  Medication Sig Dispense Refill  . albuterol (PROAIR HFA) 108 (90 Base) MCG/ACT inhaler Inhale into the lungs.    . Artificial Tear Solution (SOOTHE XP OP) Apply 1 drop to eye as needed.    Marland Kitchen atorvastatin (LIPITOR) 20 MG tablet Take 1 tablet (20 mg total) by mouth daily. 90 tablet 3  . cycloSPORINE (RESTASIS) 0.05 % ophthalmic emulsion Place 1 drop into both eyes daily.     Marland Kitchen Epoetin Alfa (EPOGEN IJ) Inject as directed every 30 (thirty) days. Administered at Dialysis center.    . fluconazole (DIFLUCAN) 150 MG tablet Take 1 tablet (150 mg total) by mouth once a week. 4 tablet 0  . folic acid (FOLVITE) 1 MG tablet Take 5 mg by mouth daily.    Marland Kitchen gentamicin cream (GARAMYCIN) 0.1 %   0  . Hypromellose (ARTIFICIAL TEARS OP) Apply 1 drop to eye as needed.    Marland Kitchen losartan (COZAAR) 100 MG tablet Take 1 tablet (100 mg total) by mouth daily. (Patient taking differently: Take 50 mg by mouth daily. ) 90 tablet 3  . methotrexate (RHEUMATREX) 2.5 MG tablet Take 10 mg by mouth once a week.    . ondansetron (ZOFRAN-ODT) 4 MG disintegrating tablet Take 1 tablet (4 mg total) by mouth every 8 (eight) hours as needed for nausea or vomiting. 30 tablet 0  . pantoprazole (PROTONIX) 40 MG tablet Take 1 tablet (40 mg total) by mouth 2 (two) times daily before a meal. (Patient taking differently: Take 40 mg by mouth daily. ) 60 tablet 0  . triamcinolone cream (KENALOG) 0.1 % Apply 1 application topically 2 (two) times daily. 30 g 0  . VELTASSA 8.4 g packet Take 1 packet by mouth daily.  3   No current facility-administered medications for this visit.     OBJECTIVE: Vitals:   04/03/16 1020  BP: 119/84  Pulse: 65  Resp: 18  Temp: (!) 95.1 F  (35.1 C)     Body mass index is 25.91 kg/m.    ECOG FS:0 - Asymptomatic  General: Well-developed, well-nourished, no acute distress. Eyes: Pink conjunctiva, anicteric sclera. Lungs: Clear to auscultation bilaterally. Heart: Regular rate and rhythm. No rubs, murmurs, or gallops. Abdomen: Soft, nontender, nondistended. No organomegaly noted, normoactive bowel sounds. Musculoskeletal: No edema, cyanosis, or clubbing. Neuro: Alert, answering all questions appropriately. Cranial nerves grossly intact. Skin: No rashes or petechiae noted. Psych: Normal affect.   LAB RESULTS:  Lab Results  Component Value Date   NA 139 12/30/2015   K 4.5 12/30/2015   CL 109 12/30/2015   CO2 25 12/30/2015   GLUCOSE 109 (H) 12/30/2015   BUN 58 (H) 12/30/2015   CREATININE 5.22 (H) 12/30/2015   CALCIUM 8.9 12/30/2015   PROT 6.3 03/28/2016   ALBUMIN 3.5 (L) 03/28/2016   AST 26 03/28/2016   ALT  44 03/28/2016   ALKPHOS 131 (H) 03/28/2016   BILITOT 0.4 03/28/2016   GFRNONAA 11 (L) 12/30/2015   GFRAA 13 (L) 12/30/2015    Lab Results  Component Value Date   WBC 10.8 (H) 12/30/2015   NEUTROABS 7.0 (H) 12/30/2015   HGB 8.2 (L) 12/30/2015   HCT 24.9 (L) 12/30/2015   MCV 95.9 12/30/2015   PLT 285 12/30/2015   Lab Results  Component Value Date   IRON 162 12/30/2015   TIBC 277 12/30/2015   IRONPCTSAT 58 (H) 12/30/2015    Lab Results  Component Value Date   FERRITIN 503 (H) 12/30/2015     STUDIES: No results found.  ASSESSMENT: Primary renal amyloidosis, unclear if AA or AL subtype.   PLAN:    1. Primary renal amyloidosis: After further evaluation, it was determined the patient has a rare genetic form of amyloidosis that is unrelated to plasma cells. By report only 10 families worldwide have this particular type of amyloidosis. Preliminary renal biopsy "does not show evidence for AL amyloid with negative staining for kappa and lambda light chains. Congo red staining, electron microscopy and  additional studies to try to identify the molecular origin of the amyloid are also inconclusive."  Patient has a negative SPEP but does have an elevated kappa light chain of unclear significance. Fat pad biopsy confirmed systemic amyloid. Bone marrow biopsy had less than 5% plasma cells and only focal deposits of amyloid.  Because patient's amyloidosis is genetic and unrelated to plasma cells, if he had progressive disease treatment with chemotherapy would not offer any benefit.  Because of this, no further follow-up is necessary in the [place]. Please refer patient back if there are any questions or concerns or he needs treatment for his anemia.  2. Anemia:  Slightly worse. Iron stores, B12, folate, and hemolysis labs are all within normal limits. Continue Epogen at dialysis. No intervention is needed at this time. Patient is also taking methotrexate 82m once per week since March 2017. This low dose is unlikely contributing but possible. No follow-up has been scheduled, please refer patient back if he requires additional treatment for his anemia.  3. End-stage renal disease: Secondary to amyloid. Continue dialysis and treatment per nephrology. Patient is also being evaluated for kidney transplant.   Approximately 30 minutes was spent in discussion of which greater than 50% was consultation.  Patient expressed understanding and was in agreement with this plan. He also understands that He can call clinic at any time with any questions, concerns, or complaints.    TLloyd Huger MD   04/05/2016 9:57 AM

## 2016-04-03 ENCOUNTER — Inpatient Hospital Stay (HOSPITAL_BASED_OUTPATIENT_CLINIC_OR_DEPARTMENT_OTHER): Payer: 59 | Admitting: Oncology

## 2016-04-03 ENCOUNTER — Inpatient Hospital Stay: Payer: 59 | Attending: Oncology

## 2016-04-03 VITALS — BP 119/84 | HR 65 | Temp 95.1°F | Resp 18 | Wt 191.0 lb

## 2016-04-03 DIAGNOSIS — Z79899 Other long term (current) drug therapy: Secondary | ICD-10-CM

## 2016-04-03 DIAGNOSIS — N08 Glomerular disorders in diseases classified elsewhere: Principal | ICD-10-CM

## 2016-04-03 DIAGNOSIS — N289 Disorder of kidney and ureter, unspecified: Secondary | ICD-10-CM | POA: Insufficient documentation

## 2016-04-03 DIAGNOSIS — D759 Disease of blood and blood-forming organs, unspecified: Secondary | ICD-10-CM | POA: Insufficient documentation

## 2016-04-03 DIAGNOSIS — Z992 Dependence on renal dialysis: Secondary | ICD-10-CM

## 2016-04-03 DIAGNOSIS — N186 End stage renal disease: Secondary | ICD-10-CM

## 2016-04-03 DIAGNOSIS — I129 Hypertensive chronic kidney disease with stage 1 through stage 4 chronic kidney disease, or unspecified chronic kidney disease: Secondary | ICD-10-CM | POA: Insufficient documentation

## 2016-04-03 DIAGNOSIS — R630 Anorexia: Secondary | ICD-10-CM | POA: Insufficient documentation

## 2016-04-03 DIAGNOSIS — R011 Cardiac murmur, unspecified: Secondary | ICD-10-CM | POA: Diagnosis not present

## 2016-04-03 DIAGNOSIS — R5383 Other fatigue: Secondary | ICD-10-CM | POA: Insufficient documentation

## 2016-04-03 DIAGNOSIS — E854 Organ-limited amyloidosis: Secondary | ICD-10-CM | POA: Diagnosis not present

## 2016-04-03 DIAGNOSIS — Z8701 Personal history of pneumonia (recurrent): Secondary | ICD-10-CM

## 2016-04-03 DIAGNOSIS — Z87891 Personal history of nicotine dependence: Secondary | ICD-10-CM

## 2016-04-03 DIAGNOSIS — K219 Gastro-esophageal reflux disease without esophagitis: Secondary | ICD-10-CM | POA: Insufficient documentation

## 2016-04-03 DIAGNOSIS — D649 Anemia, unspecified: Secondary | ICD-10-CM | POA: Diagnosis not present

## 2016-04-03 DIAGNOSIS — E785 Hyperlipidemia, unspecified: Secondary | ICD-10-CM

## 2016-04-03 NOTE — Progress Notes (Signed)
States is feeling well. Offers no complaints. 

## 2016-04-04 DIAGNOSIS — Z992 Dependence on renal dialysis: Secondary | ICD-10-CM | POA: Diagnosis not present

## 2016-04-04 DIAGNOSIS — N186 End stage renal disease: Secondary | ICD-10-CM | POA: Diagnosis not present

## 2016-04-05 DIAGNOSIS — Z992 Dependence on renal dialysis: Secondary | ICD-10-CM | POA: Diagnosis not present

## 2016-04-05 DIAGNOSIS — N186 End stage renal disease: Secondary | ICD-10-CM | POA: Diagnosis not present

## 2016-04-06 DIAGNOSIS — Z992 Dependence on renal dialysis: Secondary | ICD-10-CM | POA: Diagnosis not present

## 2016-04-06 DIAGNOSIS — N186 End stage renal disease: Secondary | ICD-10-CM | POA: Diagnosis not present

## 2016-04-07 DIAGNOSIS — N186 End stage renal disease: Secondary | ICD-10-CM | POA: Diagnosis not present

## 2016-04-07 DIAGNOSIS — Z992 Dependence on renal dialysis: Secondary | ICD-10-CM | POA: Diagnosis not present

## 2016-04-08 DIAGNOSIS — N186 End stage renal disease: Secondary | ICD-10-CM | POA: Diagnosis not present

## 2016-04-08 DIAGNOSIS — Z992 Dependence on renal dialysis: Secondary | ICD-10-CM | POA: Diagnosis not present

## 2016-04-09 DIAGNOSIS — N186 End stage renal disease: Secondary | ICD-10-CM | POA: Diagnosis not present

## 2016-04-09 DIAGNOSIS — Z992 Dependence on renal dialysis: Secondary | ICD-10-CM | POA: Diagnosis not present

## 2016-04-10 DIAGNOSIS — N186 End stage renal disease: Secondary | ICD-10-CM | POA: Diagnosis not present

## 2016-04-10 DIAGNOSIS — Z992 Dependence on renal dialysis: Secondary | ICD-10-CM | POA: Diagnosis not present

## 2016-04-11 DIAGNOSIS — N186 End stage renal disease: Secondary | ICD-10-CM | POA: Diagnosis not present

## 2016-04-11 DIAGNOSIS — Z992 Dependence on renal dialysis: Secondary | ICD-10-CM | POA: Diagnosis not present

## 2016-04-12 DIAGNOSIS — N186 End stage renal disease: Secondary | ICD-10-CM | POA: Diagnosis not present

## 2016-04-12 DIAGNOSIS — Z992 Dependence on renal dialysis: Secondary | ICD-10-CM | POA: Diagnosis not present

## 2016-04-13 DIAGNOSIS — N186 End stage renal disease: Secondary | ICD-10-CM | POA: Diagnosis not present

## 2016-04-13 DIAGNOSIS — Z992 Dependence on renal dialysis: Secondary | ICD-10-CM | POA: Diagnosis not present

## 2016-04-14 DIAGNOSIS — Z992 Dependence on renal dialysis: Secondary | ICD-10-CM | POA: Diagnosis not present

## 2016-04-14 DIAGNOSIS — N186 End stage renal disease: Secondary | ICD-10-CM | POA: Diagnosis not present

## 2016-04-15 DIAGNOSIS — N186 End stage renal disease: Secondary | ICD-10-CM | POA: Diagnosis not present

## 2016-04-15 DIAGNOSIS — Z992 Dependence on renal dialysis: Secondary | ICD-10-CM | POA: Diagnosis not present

## 2016-04-16 DIAGNOSIS — N186 End stage renal disease: Secondary | ICD-10-CM | POA: Diagnosis not present

## 2016-04-16 DIAGNOSIS — Z992 Dependence on renal dialysis: Secondary | ICD-10-CM | POA: Diagnosis not present

## 2016-04-17 DIAGNOSIS — H04123 Dry eye syndrome of bilateral lacrimal glands: Secondary | ICD-10-CM | POA: Diagnosis not present

## 2016-04-17 DIAGNOSIS — H04129 Dry eye syndrome of unspecified lacrimal gland: Secondary | ICD-10-CM | POA: Diagnosis not present

## 2016-04-17 DIAGNOSIS — H2513 Age-related nuclear cataract, bilateral: Secondary | ICD-10-CM | POA: Diagnosis not present

## 2016-04-17 DIAGNOSIS — H02834 Dermatochalasis of left upper eyelid: Secondary | ICD-10-CM | POA: Diagnosis not present

## 2016-04-17 DIAGNOSIS — H02831 Dermatochalasis of right upper eyelid: Secondary | ICD-10-CM | POA: Diagnosis not present

## 2016-04-17 DIAGNOSIS — Z992 Dependence on renal dialysis: Secondary | ICD-10-CM | POA: Diagnosis not present

## 2016-04-17 DIAGNOSIS — H0289 Other specified disorders of eyelid: Secondary | ICD-10-CM | POA: Diagnosis not present

## 2016-04-17 DIAGNOSIS — N186 End stage renal disease: Secondary | ICD-10-CM | POA: Diagnosis not present

## 2016-04-18 DIAGNOSIS — Z992 Dependence on renal dialysis: Secondary | ICD-10-CM | POA: Diagnosis not present

## 2016-04-18 DIAGNOSIS — N186 End stage renal disease: Secondary | ICD-10-CM | POA: Diagnosis not present

## 2016-04-19 DIAGNOSIS — Z992 Dependence on renal dialysis: Secondary | ICD-10-CM | POA: Diagnosis not present

## 2016-04-19 DIAGNOSIS — N186 End stage renal disease: Secondary | ICD-10-CM | POA: Diagnosis not present

## 2016-04-20 DIAGNOSIS — N186 End stage renal disease: Secondary | ICD-10-CM | POA: Diagnosis not present

## 2016-04-20 DIAGNOSIS — Z992 Dependence on renal dialysis: Secondary | ICD-10-CM | POA: Diagnosis not present

## 2016-04-21 DIAGNOSIS — Z992 Dependence on renal dialysis: Secondary | ICD-10-CM | POA: Diagnosis not present

## 2016-04-21 DIAGNOSIS — N186 End stage renal disease: Secondary | ICD-10-CM | POA: Diagnosis not present

## 2016-04-22 DIAGNOSIS — N186 End stage renal disease: Secondary | ICD-10-CM | POA: Diagnosis not present

## 2016-04-22 DIAGNOSIS — Z992 Dependence on renal dialysis: Secondary | ICD-10-CM | POA: Diagnosis not present

## 2016-04-23 DIAGNOSIS — N186 End stage renal disease: Secondary | ICD-10-CM | POA: Diagnosis not present

## 2016-04-23 DIAGNOSIS — Z992 Dependence on renal dialysis: Secondary | ICD-10-CM | POA: Diagnosis not present

## 2016-04-24 DIAGNOSIS — N186 End stage renal disease: Secondary | ICD-10-CM | POA: Diagnosis not present

## 2016-04-24 DIAGNOSIS — Z992 Dependence on renal dialysis: Secondary | ICD-10-CM | POA: Diagnosis not present

## 2016-04-25 DIAGNOSIS — Z992 Dependence on renal dialysis: Secondary | ICD-10-CM | POA: Diagnosis not present

## 2016-04-25 DIAGNOSIS — N186 End stage renal disease: Secondary | ICD-10-CM | POA: Diagnosis not present

## 2016-04-26 DIAGNOSIS — N186 End stage renal disease: Secondary | ICD-10-CM | POA: Diagnosis not present

## 2016-04-26 DIAGNOSIS — Z992 Dependence on renal dialysis: Secondary | ICD-10-CM | POA: Diagnosis not present

## 2016-04-27 DIAGNOSIS — N186 End stage renal disease: Secondary | ICD-10-CM | POA: Diagnosis not present

## 2016-04-27 DIAGNOSIS — Z992 Dependence on renal dialysis: Secondary | ICD-10-CM | POA: Diagnosis not present

## 2016-04-28 DIAGNOSIS — N186 End stage renal disease: Secondary | ICD-10-CM | POA: Diagnosis not present

## 2016-04-28 DIAGNOSIS — Z992 Dependence on renal dialysis: Secondary | ICD-10-CM | POA: Diagnosis not present

## 2016-04-29 DIAGNOSIS — Z992 Dependence on renal dialysis: Secondary | ICD-10-CM | POA: Diagnosis not present

## 2016-04-29 DIAGNOSIS — N186 End stage renal disease: Secondary | ICD-10-CM | POA: Diagnosis not present

## 2016-04-30 DIAGNOSIS — Z992 Dependence on renal dialysis: Secondary | ICD-10-CM | POA: Diagnosis not present

## 2016-04-30 DIAGNOSIS — N186 End stage renal disease: Secondary | ICD-10-CM | POA: Diagnosis not present

## 2016-05-01 DIAGNOSIS — N186 End stage renal disease: Secondary | ICD-10-CM | POA: Diagnosis not present

## 2016-05-01 DIAGNOSIS — Z992 Dependence on renal dialysis: Secondary | ICD-10-CM | POA: Diagnosis not present

## 2016-05-02 DIAGNOSIS — N186 End stage renal disease: Secondary | ICD-10-CM | POA: Diagnosis not present

## 2016-05-02 DIAGNOSIS — Z992 Dependence on renal dialysis: Secondary | ICD-10-CM | POA: Diagnosis not present

## 2016-05-03 DIAGNOSIS — N289 Disorder of kidney and ureter, unspecified: Secondary | ICD-10-CM | POA: Diagnosis not present

## 2016-05-03 DIAGNOSIS — R682 Dry mouth, unspecified: Secondary | ICD-10-CM | POA: Diagnosis not present

## 2016-05-03 DIAGNOSIS — E8581 Light chain (AL) amyloidosis: Secondary | ICD-10-CM | POA: Diagnosis not present

## 2016-05-03 DIAGNOSIS — E854 Organ-limited amyloidosis: Secondary | ICD-10-CM | POA: Diagnosis not present

## 2016-05-03 DIAGNOSIS — H04123 Dry eye syndrome of bilateral lacrimal glands: Secondary | ICD-10-CM | POA: Diagnosis not present

## 2016-05-03 DIAGNOSIS — Z87891 Personal history of nicotine dependence: Secondary | ICD-10-CM | POA: Diagnosis not present

## 2016-05-03 DIAGNOSIS — Z992 Dependence on renal dialysis: Secondary | ICD-10-CM | POA: Diagnosis not present

## 2016-05-03 DIAGNOSIS — H35719 Central serous chorioretinopathy, unspecified eye: Secondary | ICD-10-CM | POA: Diagnosis not present

## 2016-05-03 DIAGNOSIS — E8589 Other amyloidosis: Secondary | ICD-10-CM | POA: Diagnosis not present

## 2016-05-03 DIAGNOSIS — R0609 Other forms of dyspnea: Secondary | ICD-10-CM | POA: Diagnosis not present

## 2016-05-03 DIAGNOSIS — E852 Heredofamilial amyloidosis, unspecified: Secondary | ICD-10-CM | POA: Insufficient documentation

## 2016-05-03 DIAGNOSIS — N186 End stage renal disease: Secondary | ICD-10-CM | POA: Diagnosis not present

## 2016-05-03 DIAGNOSIS — H35713 Central serous chorioretinopathy, bilateral: Secondary | ICD-10-CM | POA: Diagnosis not present

## 2016-05-03 DIAGNOSIS — Z79899 Other long term (current) drug therapy: Secondary | ICD-10-CM | POA: Diagnosis not present

## 2016-05-04 DIAGNOSIS — N08 Glomerular disorders in diseases classified elsewhere: Secondary | ICD-10-CM

## 2016-05-04 DIAGNOSIS — Z992 Dependence on renal dialysis: Secondary | ICD-10-CM

## 2016-05-04 DIAGNOSIS — N186 End stage renal disease: Secondary | ICD-10-CM | POA: Diagnosis not present

## 2016-05-04 DIAGNOSIS — E854 Organ-limited amyloidosis: Secondary | ICD-10-CM | POA: Insufficient documentation

## 2016-05-05 DIAGNOSIS — N186 End stage renal disease: Secondary | ICD-10-CM | POA: Diagnosis not present

## 2016-05-05 DIAGNOSIS — Z992 Dependence on renal dialysis: Secondary | ICD-10-CM | POA: Diagnosis not present

## 2016-05-06 DIAGNOSIS — Z992 Dependence on renal dialysis: Secondary | ICD-10-CM | POA: Diagnosis not present

## 2016-05-06 DIAGNOSIS — N186 End stage renal disease: Secondary | ICD-10-CM | POA: Diagnosis not present

## 2016-05-07 DIAGNOSIS — N186 End stage renal disease: Secondary | ICD-10-CM | POA: Diagnosis not present

## 2016-05-07 DIAGNOSIS — Z992 Dependence on renal dialysis: Secondary | ICD-10-CM | POA: Diagnosis not present

## 2016-05-08 DIAGNOSIS — N186 End stage renal disease: Secondary | ICD-10-CM | POA: Diagnosis not present

## 2016-05-08 DIAGNOSIS — Z992 Dependence on renal dialysis: Secondary | ICD-10-CM | POA: Diagnosis not present

## 2016-05-09 DIAGNOSIS — N186 End stage renal disease: Secondary | ICD-10-CM | POA: Diagnosis not present

## 2016-05-09 DIAGNOSIS — Z992 Dependence on renal dialysis: Secondary | ICD-10-CM | POA: Diagnosis not present

## 2016-05-10 DIAGNOSIS — Z992 Dependence on renal dialysis: Secondary | ICD-10-CM | POA: Diagnosis not present

## 2016-05-10 DIAGNOSIS — N186 End stage renal disease: Secondary | ICD-10-CM | POA: Diagnosis not present

## 2016-05-11 DIAGNOSIS — Z992 Dependence on renal dialysis: Secondary | ICD-10-CM | POA: Diagnosis not present

## 2016-05-11 DIAGNOSIS — N186 End stage renal disease: Secondary | ICD-10-CM | POA: Diagnosis not present

## 2016-05-12 DIAGNOSIS — N186 End stage renal disease: Secondary | ICD-10-CM | POA: Diagnosis not present

## 2016-05-12 DIAGNOSIS — Z992 Dependence on renal dialysis: Secondary | ICD-10-CM | POA: Diagnosis not present

## 2016-05-13 DIAGNOSIS — Z992 Dependence on renal dialysis: Secondary | ICD-10-CM | POA: Diagnosis not present

## 2016-05-13 DIAGNOSIS — N186 End stage renal disease: Secondary | ICD-10-CM | POA: Diagnosis not present

## 2016-05-14 DIAGNOSIS — Z992 Dependence on renal dialysis: Secondary | ICD-10-CM | POA: Diagnosis not present

## 2016-05-14 DIAGNOSIS — N186 End stage renal disease: Secondary | ICD-10-CM | POA: Diagnosis not present

## 2016-05-15 DIAGNOSIS — Z992 Dependence on renal dialysis: Secondary | ICD-10-CM | POA: Diagnosis not present

## 2016-05-15 DIAGNOSIS — N186 End stage renal disease: Secondary | ICD-10-CM | POA: Diagnosis not present

## 2016-05-16 DIAGNOSIS — N186 End stage renal disease: Secondary | ICD-10-CM | POA: Diagnosis not present

## 2016-05-16 DIAGNOSIS — Z992 Dependence on renal dialysis: Secondary | ICD-10-CM | POA: Diagnosis not present

## 2016-05-17 DIAGNOSIS — N186 End stage renal disease: Secondary | ICD-10-CM | POA: Diagnosis not present

## 2016-05-17 DIAGNOSIS — Z992 Dependence on renal dialysis: Secondary | ICD-10-CM | POA: Diagnosis not present

## 2016-05-18 DIAGNOSIS — Z992 Dependence on renal dialysis: Secondary | ICD-10-CM | POA: Diagnosis not present

## 2016-05-18 DIAGNOSIS — N186 End stage renal disease: Secondary | ICD-10-CM | POA: Diagnosis not present

## 2016-05-19 DIAGNOSIS — N186 End stage renal disease: Secondary | ICD-10-CM | POA: Diagnosis not present

## 2016-05-19 DIAGNOSIS — Z992 Dependence on renal dialysis: Secondary | ICD-10-CM | POA: Diagnosis not present

## 2016-05-20 DIAGNOSIS — Z992 Dependence on renal dialysis: Secondary | ICD-10-CM | POA: Diagnosis not present

## 2016-05-20 DIAGNOSIS — N186 End stage renal disease: Secondary | ICD-10-CM | POA: Diagnosis not present

## 2016-05-21 DIAGNOSIS — N186 End stage renal disease: Secondary | ICD-10-CM | POA: Diagnosis not present

## 2016-05-21 DIAGNOSIS — Z992 Dependence on renal dialysis: Secondary | ICD-10-CM | POA: Diagnosis not present

## 2016-05-22 DIAGNOSIS — N186 End stage renal disease: Secondary | ICD-10-CM | POA: Diagnosis not present

## 2016-05-22 DIAGNOSIS — Z992 Dependence on renal dialysis: Secondary | ICD-10-CM | POA: Diagnosis not present

## 2016-05-23 DIAGNOSIS — N186 End stage renal disease: Secondary | ICD-10-CM | POA: Diagnosis not present

## 2016-05-23 DIAGNOSIS — Z992 Dependence on renal dialysis: Secondary | ICD-10-CM | POA: Diagnosis not present

## 2016-05-24 DIAGNOSIS — N186 End stage renal disease: Secondary | ICD-10-CM | POA: Diagnosis not present

## 2016-05-24 DIAGNOSIS — Z992 Dependence on renal dialysis: Secondary | ICD-10-CM | POA: Diagnosis not present

## 2016-05-25 DIAGNOSIS — N186 End stage renal disease: Secondary | ICD-10-CM | POA: Diagnosis not present

## 2016-05-25 DIAGNOSIS — Z992 Dependence on renal dialysis: Secondary | ICD-10-CM | POA: Diagnosis not present

## 2016-05-26 DIAGNOSIS — Z992 Dependence on renal dialysis: Secondary | ICD-10-CM | POA: Diagnosis not present

## 2016-05-26 DIAGNOSIS — N186 End stage renal disease: Secondary | ICD-10-CM | POA: Diagnosis not present

## 2016-05-27 DIAGNOSIS — Z992 Dependence on renal dialysis: Secondary | ICD-10-CM | POA: Diagnosis not present

## 2016-05-27 DIAGNOSIS — N186 End stage renal disease: Secondary | ICD-10-CM | POA: Diagnosis not present

## 2016-05-28 DIAGNOSIS — N186 End stage renal disease: Secondary | ICD-10-CM | POA: Diagnosis not present

## 2016-05-28 DIAGNOSIS — Z992 Dependence on renal dialysis: Secondary | ICD-10-CM | POA: Diagnosis not present

## 2016-05-29 DIAGNOSIS — Z992 Dependence on renal dialysis: Secondary | ICD-10-CM | POA: Diagnosis not present

## 2016-05-29 DIAGNOSIS — N186 End stage renal disease: Secondary | ICD-10-CM | POA: Diagnosis not present

## 2016-05-30 DIAGNOSIS — N186 End stage renal disease: Secondary | ICD-10-CM | POA: Diagnosis not present

## 2016-05-30 DIAGNOSIS — Z992 Dependence on renal dialysis: Secondary | ICD-10-CM | POA: Diagnosis not present

## 2016-05-31 DIAGNOSIS — N186 End stage renal disease: Secondary | ICD-10-CM | POA: Diagnosis not present

## 2016-05-31 DIAGNOSIS — Z992 Dependence on renal dialysis: Secondary | ICD-10-CM | POA: Diagnosis not present

## 2016-06-01 DIAGNOSIS — N186 End stage renal disease: Secondary | ICD-10-CM | POA: Diagnosis not present

## 2016-06-01 DIAGNOSIS — Z992 Dependence on renal dialysis: Secondary | ICD-10-CM | POA: Diagnosis not present

## 2016-06-02 DIAGNOSIS — N186 End stage renal disease: Secondary | ICD-10-CM | POA: Diagnosis not present

## 2016-06-02 DIAGNOSIS — Z992 Dependence on renal dialysis: Secondary | ICD-10-CM | POA: Diagnosis not present

## 2016-06-03 DIAGNOSIS — Z992 Dependence on renal dialysis: Secondary | ICD-10-CM | POA: Diagnosis not present

## 2016-06-03 DIAGNOSIS — N186 End stage renal disease: Secondary | ICD-10-CM | POA: Diagnosis not present

## 2016-06-04 DIAGNOSIS — N186 End stage renal disease: Secondary | ICD-10-CM | POA: Diagnosis not present

## 2016-06-04 DIAGNOSIS — Z992 Dependence on renal dialysis: Secondary | ICD-10-CM | POA: Diagnosis not present

## 2016-06-05 ENCOUNTER — Encounter: Payer: Self-pay | Admitting: Family Medicine

## 2016-06-05 ENCOUNTER — Ambulatory Visit (INDEPENDENT_AMBULATORY_CARE_PROVIDER_SITE_OTHER): Payer: Medicare Other | Admitting: Family Medicine

## 2016-06-05 VITALS — BP 120/78 | HR 100 | Temp 98.0°F | Resp 14 | Ht 72.0 in | Wt 192.0 lb

## 2016-06-05 DIAGNOSIS — E852 Heredofamilial amyloidosis, unspecified: Secondary | ICD-10-CM

## 2016-06-05 DIAGNOSIS — N186 End stage renal disease: Secondary | ICD-10-CM | POA: Diagnosis not present

## 2016-06-05 DIAGNOSIS — B369 Superficial mycosis, unspecified: Secondary | ICD-10-CM

## 2016-06-05 DIAGNOSIS — Z992 Dependence on renal dialysis: Secondary | ICD-10-CM | POA: Diagnosis not present

## 2016-06-05 NOTE — Progress Notes (Signed)
   Subjective:    Patient ID: Joseph [name], male    DOB: 09-Mar-1966, 50 y.o.   MRN: [id number]  Patient presents for Rash (hypopigmented and scattered erythematous lesions- scalp, arms, back, legs)  Patient here with rash. He was seen back in September with erythematous maculopapular lesions on his buttocks and lower extremities biopsy was done which showed Tinea and he was treated with fluconazole 150 mg every weekly 4 weeks. His rash did improve with the fluconazole and was almost completely clear but has returned over the past few weeks. His pruritic lesions on his lower back as well as his thighs which is the worse area. They're not as red and numerous as they were before. 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 He is currently on the transplant list he has been approved. Of note he was seen by [facility] physician's November 7 he had liver functions done it showed AST 40 ALT 80     Review Of Systems:  GEN- denies fatigue, fever, weight loss,weakness, recent illness HEENT- denies eye drainage, change in vision, nasal discharge, CVS- denies chest pain, palpitations RESP- denies SOB, cough, wheeze ABD- denies N/V, change in stools, abd pain GU- denies dysuria, hematuria, dribbling, incontinence MSK- denies joint pain, muscle aches, injury Neuro- denies headache, dizziness, syncope, seizure activity       Objective:    BP 120/78 (BP Location: Left Arm, Patient Position: Sitting, Cuff Size: Large)   Pulse 100   Temp 98 F (36.7 C) (Oral)   Resp 14   Ht 6' (1.829 m)   Wt 192 lb (87.1 kg)   SpO2 98%   BMI 26.04 kg/m  GEN- NAD, alert and oriented x3 HEENT- PERRL, EOMI, non injected sclera, pink conjunctiva, MMM, oropharynx clear Neck- Supple, no LAD Skin-  Small erythematous maculopapular  Lesions some hyperpigmented , scattered on buttocks bilateral thighs and in the part as well as lower back few lesion on his arms and lower legs EXT- No  edema Dry skin on hands  Pulses- Radial 2+         Assessment & Plan:      Problem List Items Addressed This Visit    None    Visit Diagnoses    Fungal infection of skin    -  Primary   Fungal elements on punch biopsy, responded well to fluconazole but concerned about his rising LFT in setting of transplant he can be called any time and concern amyloid is also affecting his liver. He is immunocompromised of course as well. Will send to dermatology for evaluaiton. My concern is him having an infection when he can be called for transplant surgery    Relevant Orders   Ambulatory referral to Dermatology   Heredofamilial amyloidosis [facility])       Relevant Orders   Ambulatory referral to Dermatology      Note: This dictation was prepared with Dragon dictation along with smaller phrase technology. Any transcriptional errors that result from this process are unintentional.

## 2016-06-05 NOTE — Patient Instructions (Addendum)
Dermatology appt as scheduled [facility]  Dr. Derrel Nip  2:40pm  F/U as previous

## 2016-06-06 DIAGNOSIS — Z992 Dependence on renal dialysis: Secondary | ICD-10-CM | POA: Diagnosis not present

## 2016-06-06 DIAGNOSIS — N186 End stage renal disease: Secondary | ICD-10-CM | POA: Diagnosis not present

## 2016-06-06 DIAGNOSIS — L738 Other specified follicular disorders: Secondary | ICD-10-CM | POA: Diagnosis not present

## 2016-06-06 DIAGNOSIS — R21 Rash and other nonspecific skin eruption: Secondary | ICD-10-CM | POA: Diagnosis not present

## 2016-06-06 DIAGNOSIS — L308 Other specified dermatitis: Secondary | ICD-10-CM | POA: Diagnosis not present

## 2016-06-07 DIAGNOSIS — Z992 Dependence on renal dialysis: Secondary | ICD-10-CM | POA: Diagnosis not present

## 2016-06-07 DIAGNOSIS — N186 End stage renal disease: Secondary | ICD-10-CM | POA: Diagnosis not present

## 2016-06-08 DIAGNOSIS — Z992 Dependence on renal dialysis: Secondary | ICD-10-CM | POA: Diagnosis not present

## 2016-06-08 DIAGNOSIS — N186 End stage renal disease: Secondary | ICD-10-CM | POA: Diagnosis not present

## 2016-06-09 DIAGNOSIS — Z992 Dependence on renal dialysis: Secondary | ICD-10-CM | POA: Diagnosis not present

## 2016-06-09 DIAGNOSIS — N186 End stage renal disease: Secondary | ICD-10-CM | POA: Diagnosis not present

## 2016-06-10 DIAGNOSIS — N186 End stage renal disease: Secondary | ICD-10-CM | POA: Diagnosis not present

## 2016-06-10 DIAGNOSIS — Z992 Dependence on renal dialysis: Secondary | ICD-10-CM | POA: Diagnosis not present

## 2016-06-11 DIAGNOSIS — Z992 Dependence on renal dialysis: Secondary | ICD-10-CM | POA: Diagnosis not present

## 2016-06-11 DIAGNOSIS — N186 End stage renal disease: Secondary | ICD-10-CM | POA: Diagnosis not present

## 2016-06-12 DIAGNOSIS — N186 End stage renal disease: Secondary | ICD-10-CM | POA: Diagnosis not present

## 2016-06-12 DIAGNOSIS — Z992 Dependence on renal dialysis: Secondary | ICD-10-CM | POA: Diagnosis not present

## 2016-06-13 DIAGNOSIS — N186 End stage renal disease: Secondary | ICD-10-CM | POA: Diagnosis not present

## 2016-06-13 DIAGNOSIS — Z992 Dependence on renal dialysis: Secondary | ICD-10-CM | POA: Diagnosis not present

## 2016-06-14 DIAGNOSIS — N186 End stage renal disease: Secondary | ICD-10-CM | POA: Diagnosis not present

## 2016-06-14 DIAGNOSIS — Z992 Dependence on renal dialysis: Secondary | ICD-10-CM | POA: Diagnosis not present

## 2016-06-15 DIAGNOSIS — N186 End stage renal disease: Secondary | ICD-10-CM | POA: Diagnosis not present

## 2016-06-15 DIAGNOSIS — Z992 Dependence on renal dialysis: Secondary | ICD-10-CM | POA: Diagnosis not present

## 2016-06-16 DIAGNOSIS — Z992 Dependence on renal dialysis: Secondary | ICD-10-CM | POA: Diagnosis not present

## 2016-06-16 DIAGNOSIS — N186 End stage renal disease: Secondary | ICD-10-CM | POA: Diagnosis not present

## 2016-06-17 DIAGNOSIS — Z992 Dependence on renal dialysis: Secondary | ICD-10-CM | POA: Diagnosis not present

## 2016-06-17 DIAGNOSIS — N186 End stage renal disease: Secondary | ICD-10-CM | POA: Diagnosis not present

## 2016-06-18 DIAGNOSIS — N186 End stage renal disease: Secondary | ICD-10-CM | POA: Diagnosis not present

## 2016-06-18 DIAGNOSIS — Z992 Dependence on renal dialysis: Secondary | ICD-10-CM | POA: Diagnosis not present

## 2016-06-19 DIAGNOSIS — N186 End stage renal disease: Secondary | ICD-10-CM | POA: Diagnosis not present

## 2016-06-19 DIAGNOSIS — Z992 Dependence on renal dialysis: Secondary | ICD-10-CM | POA: Diagnosis not present

## 2016-06-20 DIAGNOSIS — N186 End stage renal disease: Secondary | ICD-10-CM | POA: Diagnosis not present

## 2016-06-20 DIAGNOSIS — Z992 Dependence on renal dialysis: Secondary | ICD-10-CM | POA: Diagnosis not present

## 2016-06-21 DIAGNOSIS — N186 End stage renal disease: Secondary | ICD-10-CM | POA: Diagnosis not present

## 2016-06-21 DIAGNOSIS — Z992 Dependence on renal dialysis: Secondary | ICD-10-CM | POA: Diagnosis not present

## 2016-06-22 DIAGNOSIS — N186 End stage renal disease: Secondary | ICD-10-CM | POA: Diagnosis not present

## 2016-06-22 DIAGNOSIS — Z992 Dependence on renal dialysis: Secondary | ICD-10-CM | POA: Diagnosis not present

## 2016-06-23 ENCOUNTER — Telehealth: Payer: Self-pay

## 2016-06-23 DIAGNOSIS — Z992 Dependence on renal dialysis: Secondary | ICD-10-CM | POA: Diagnosis not present

## 2016-06-23 DIAGNOSIS — N186 End stage renal disease: Secondary | ICD-10-CM | POA: Diagnosis not present

## 2016-06-23 NOTE — Telephone Encounter (Signed)
Spouse called states patient needs appt to have stitches removed. Spouse Claudine requesting appt also.  Returned call. No answer.

## 2016-06-23 NOTE — Telephone Encounter (Signed)
Please schedule appts together for patient and Joseph [name] (spoue)

## 2016-06-24 DIAGNOSIS — Z992 Dependence on renal dialysis: Secondary | ICD-10-CM | POA: Diagnosis not present

## 2016-06-24 DIAGNOSIS — N186 End stage renal disease: Secondary | ICD-10-CM | POA: Diagnosis not present

## 2016-06-25 DIAGNOSIS — Z992 Dependence on renal dialysis: Secondary | ICD-10-CM | POA: Diagnosis not present

## 2016-06-25 DIAGNOSIS — N186 End stage renal disease: Secondary | ICD-10-CM | POA: Diagnosis not present

## 2016-06-26 DIAGNOSIS — Z992 Dependence on renal dialysis: Secondary | ICD-10-CM | POA: Diagnosis not present

## 2016-06-26 DIAGNOSIS — N186 End stage renal disease: Secondary | ICD-10-CM | POA: Diagnosis not present

## 2016-06-27 DIAGNOSIS — Z992 Dependence on renal dialysis: Secondary | ICD-10-CM | POA: Diagnosis not present

## 2016-06-27 DIAGNOSIS — N186 End stage renal disease: Secondary | ICD-10-CM | POA: Diagnosis not present

## 2016-06-28 DIAGNOSIS — Z992 Dependence on renal dialysis: Secondary | ICD-10-CM | POA: Diagnosis not present

## 2016-06-28 DIAGNOSIS — N186 End stage renal disease: Secondary | ICD-10-CM | POA: Diagnosis not present

## 2016-06-29 DIAGNOSIS — N186 End stage renal disease: Secondary | ICD-10-CM | POA: Diagnosis not present

## 2016-06-29 DIAGNOSIS — Z992 Dependence on renal dialysis: Secondary | ICD-10-CM | POA: Diagnosis not present

## 2016-06-30 DIAGNOSIS — N186 End stage renal disease: Secondary | ICD-10-CM | POA: Diagnosis not present

## 2016-06-30 DIAGNOSIS — Z992 Dependence on renal dialysis: Secondary | ICD-10-CM | POA: Diagnosis not present

## 2016-07-01 DIAGNOSIS — Z992 Dependence on renal dialysis: Secondary | ICD-10-CM | POA: Diagnosis not present

## 2016-07-01 DIAGNOSIS — N186 End stage renal disease: Secondary | ICD-10-CM | POA: Diagnosis not present

## 2016-07-02 DIAGNOSIS — Z992 Dependence on renal dialysis: Secondary | ICD-10-CM | POA: Diagnosis not present

## 2016-07-02 DIAGNOSIS — N186 End stage renal disease: Secondary | ICD-10-CM | POA: Diagnosis not present

## 2016-07-03 DIAGNOSIS — Z992 Dependence on renal dialysis: Secondary | ICD-10-CM | POA: Diagnosis not present

## 2016-07-03 DIAGNOSIS — N186 End stage renal disease: Secondary | ICD-10-CM | POA: Diagnosis not present

## 2016-07-04 DIAGNOSIS — Z992 Dependence on renal dialysis: Secondary | ICD-10-CM | POA: Diagnosis not present

## 2016-07-04 DIAGNOSIS — N186 End stage renal disease: Secondary | ICD-10-CM | POA: Diagnosis not present

## 2016-07-05 DIAGNOSIS — N186 End stage renal disease: Secondary | ICD-10-CM | POA: Diagnosis not present

## 2016-07-05 DIAGNOSIS — Z992 Dependence on renal dialysis: Secondary | ICD-10-CM | POA: Diagnosis not present

## 2016-07-06 DIAGNOSIS — N186 End stage renal disease: Secondary | ICD-10-CM | POA: Diagnosis not present

## 2016-07-06 DIAGNOSIS — Z992 Dependence on renal dialysis: Secondary | ICD-10-CM | POA: Diagnosis not present

## 2016-07-07 DIAGNOSIS — N186 End stage renal disease: Secondary | ICD-10-CM | POA: Diagnosis not present

## 2016-07-07 DIAGNOSIS — Z992 Dependence on renal dialysis: Secondary | ICD-10-CM | POA: Diagnosis not present

## 2016-07-08 DIAGNOSIS — Z992 Dependence on renal dialysis: Secondary | ICD-10-CM | POA: Diagnosis not present

## 2016-07-08 DIAGNOSIS — N186 End stage renal disease: Secondary | ICD-10-CM | POA: Diagnosis not present

## 2016-07-09 DIAGNOSIS — N186 End stage renal disease: Secondary | ICD-10-CM | POA: Diagnosis not present

## 2016-07-09 DIAGNOSIS — Z992 Dependence on renal dialysis: Secondary | ICD-10-CM | POA: Diagnosis not present

## 2016-07-10 DIAGNOSIS — N186 End stage renal disease: Secondary | ICD-10-CM | POA: Diagnosis not present

## 2016-07-10 DIAGNOSIS — Z992 Dependence on renal dialysis: Secondary | ICD-10-CM | POA: Diagnosis not present

## 2016-07-10 NOTE — Telephone Encounter (Signed)
Joseph [name] left msg for patient to return call so we can get appointment scheduled.

## 2016-07-11 DIAGNOSIS — N186 End stage renal disease: Secondary | ICD-10-CM | POA: Diagnosis not present

## 2016-07-11 DIAGNOSIS — Z992 Dependence on renal dialysis: Secondary | ICD-10-CM | POA: Diagnosis not present

## 2016-07-12 DIAGNOSIS — N186 End stage renal disease: Secondary | ICD-10-CM | POA: Diagnosis not present

## 2016-07-12 DIAGNOSIS — Z992 Dependence on renal dialysis: Secondary | ICD-10-CM | POA: Diagnosis not present

## 2016-07-13 DIAGNOSIS — Z992 Dependence on renal dialysis: Secondary | ICD-10-CM | POA: Diagnosis not present

## 2016-07-13 DIAGNOSIS — N186 End stage renal disease: Secondary | ICD-10-CM | POA: Diagnosis not present

## 2016-07-14 DIAGNOSIS — N186 End stage renal disease: Secondary | ICD-10-CM | POA: Diagnosis not present

## 2016-07-14 DIAGNOSIS — Z992 Dependence on renal dialysis: Secondary | ICD-10-CM | POA: Diagnosis not present

## 2016-07-15 DIAGNOSIS — Z992 Dependence on renal dialysis: Secondary | ICD-10-CM | POA: Diagnosis not present

## 2016-07-15 DIAGNOSIS — N186 End stage renal disease: Secondary | ICD-10-CM | POA: Diagnosis not present

## 2016-07-16 DIAGNOSIS — N186 End stage renal disease: Secondary | ICD-10-CM | POA: Diagnosis not present

## 2016-07-16 DIAGNOSIS — Z992 Dependence on renal dialysis: Secondary | ICD-10-CM | POA: Diagnosis not present

## 2016-07-17 DIAGNOSIS — Z992 Dependence on renal dialysis: Secondary | ICD-10-CM | POA: Diagnosis not present

## 2016-07-17 DIAGNOSIS — N186 End stage renal disease: Secondary | ICD-10-CM | POA: Diagnosis not present

## 2016-07-18 DIAGNOSIS — N186 End stage renal disease: Secondary | ICD-10-CM | POA: Diagnosis not present

## 2016-07-18 DIAGNOSIS — Z992 Dependence on renal dialysis: Secondary | ICD-10-CM | POA: Diagnosis not present

## 2016-07-19 DIAGNOSIS — Z992 Dependence on renal dialysis: Secondary | ICD-10-CM | POA: Diagnosis not present

## 2016-07-19 DIAGNOSIS — N186 End stage renal disease: Secondary | ICD-10-CM | POA: Diagnosis not present

## 2016-07-20 DIAGNOSIS — Z992 Dependence on renal dialysis: Secondary | ICD-10-CM | POA: Diagnosis not present

## 2016-07-20 DIAGNOSIS — N186 End stage renal disease: Secondary | ICD-10-CM | POA: Diagnosis not present

## 2016-07-21 DIAGNOSIS — N186 End stage renal disease: Secondary | ICD-10-CM | POA: Diagnosis not present

## 2016-07-21 DIAGNOSIS — Z992 Dependence on renal dialysis: Secondary | ICD-10-CM | POA: Diagnosis not present

## 2016-07-22 DIAGNOSIS — N186 End stage renal disease: Secondary | ICD-10-CM | POA: Diagnosis not present

## 2016-07-22 DIAGNOSIS — Z992 Dependence on renal dialysis: Secondary | ICD-10-CM | POA: Diagnosis not present

## 2016-07-23 DIAGNOSIS — N186 End stage renal disease: Secondary | ICD-10-CM | POA: Diagnosis not present

## 2016-07-23 DIAGNOSIS — Z992 Dependence on renal dialysis: Secondary | ICD-10-CM | POA: Diagnosis not present

## 2016-07-24 DIAGNOSIS — Z992 Dependence on renal dialysis: Secondary | ICD-10-CM | POA: Diagnosis not present

## 2016-07-24 DIAGNOSIS — N186 End stage renal disease: Secondary | ICD-10-CM | POA: Diagnosis not present

## 2016-07-25 DIAGNOSIS — N186 End stage renal disease: Secondary | ICD-10-CM | POA: Diagnosis not present

## 2016-07-25 DIAGNOSIS — Z992 Dependence on renal dialysis: Secondary | ICD-10-CM | POA: Diagnosis not present

## 2016-07-26 DIAGNOSIS — Z992 Dependence on renal dialysis: Secondary | ICD-10-CM | POA: Diagnosis not present

## 2016-07-26 DIAGNOSIS — N186 End stage renal disease: Secondary | ICD-10-CM | POA: Diagnosis not present

## 2016-07-27 DIAGNOSIS — Z992 Dependence on renal dialysis: Secondary | ICD-10-CM | POA: Diagnosis not present

## 2016-07-27 DIAGNOSIS — N186 End stage renal disease: Secondary | ICD-10-CM | POA: Diagnosis not present

## 2016-07-28 DIAGNOSIS — N186 End stage renal disease: Secondary | ICD-10-CM | POA: Diagnosis not present

## 2016-07-28 DIAGNOSIS — Z992 Dependence on renal dialysis: Secondary | ICD-10-CM | POA: Diagnosis not present

## 2016-07-29 DIAGNOSIS — N186 End stage renal disease: Secondary | ICD-10-CM | POA: Diagnosis not present

## 2016-07-29 DIAGNOSIS — Z992 Dependence on renal dialysis: Secondary | ICD-10-CM | POA: Diagnosis not present

## 2016-07-30 DIAGNOSIS — N186 End stage renal disease: Secondary | ICD-10-CM | POA: Diagnosis not present

## 2016-07-30 DIAGNOSIS — Z992 Dependence on renal dialysis: Secondary | ICD-10-CM | POA: Diagnosis not present

## 2016-07-31 DIAGNOSIS — Z992 Dependence on renal dialysis: Secondary | ICD-10-CM | POA: Diagnosis not present

## 2016-07-31 DIAGNOSIS — N186 End stage renal disease: Secondary | ICD-10-CM | POA: Diagnosis not present

## 2016-08-01 DIAGNOSIS — Z992 Dependence on renal dialysis: Secondary | ICD-10-CM | POA: Diagnosis not present

## 2016-08-01 DIAGNOSIS — N186 End stage renal disease: Secondary | ICD-10-CM | POA: Diagnosis not present

## 2016-08-02 DIAGNOSIS — Z992 Dependence on renal dialysis: Secondary | ICD-10-CM | POA: Diagnosis not present

## 2016-08-02 DIAGNOSIS — N186 End stage renal disease: Secondary | ICD-10-CM | POA: Diagnosis not present

## 2016-08-03 DIAGNOSIS — N186 End stage renal disease: Secondary | ICD-10-CM | POA: Diagnosis not present

## 2016-08-03 DIAGNOSIS — Z992 Dependence on renal dialysis: Secondary | ICD-10-CM | POA: Diagnosis not present

## 2016-08-03 DIAGNOSIS — Z23 Encounter for immunization: Secondary | ICD-10-CM | POA: Diagnosis not present

## 2016-08-04 DIAGNOSIS — N186 End stage renal disease: Secondary | ICD-10-CM | POA: Diagnosis not present

## 2016-08-04 DIAGNOSIS — Z23 Encounter for immunization: Secondary | ICD-10-CM | POA: Diagnosis not present

## 2016-08-04 DIAGNOSIS — Z992 Dependence on renal dialysis: Secondary | ICD-10-CM | POA: Diagnosis not present

## 2016-08-05 DIAGNOSIS — Z992 Dependence on renal dialysis: Secondary | ICD-10-CM | POA: Diagnosis not present

## 2016-08-05 DIAGNOSIS — Z23 Encounter for immunization: Secondary | ICD-10-CM | POA: Diagnosis not present

## 2016-08-05 DIAGNOSIS — N186 End stage renal disease: Secondary | ICD-10-CM | POA: Diagnosis not present

## 2016-08-06 DIAGNOSIS — Z23 Encounter for immunization: Secondary | ICD-10-CM | POA: Diagnosis not present

## 2016-08-06 DIAGNOSIS — Z992 Dependence on renal dialysis: Secondary | ICD-10-CM | POA: Diagnosis not present

## 2016-08-06 DIAGNOSIS — N186 End stage renal disease: Secondary | ICD-10-CM | POA: Diagnosis not present

## 2016-08-07 DIAGNOSIS — Z23 Encounter for immunization: Secondary | ICD-10-CM | POA: Diagnosis not present

## 2016-08-07 DIAGNOSIS — N186 End stage renal disease: Secondary | ICD-10-CM | POA: Diagnosis not present

## 2016-08-07 DIAGNOSIS — Z992 Dependence on renal dialysis: Secondary | ICD-10-CM | POA: Diagnosis not present

## 2016-08-08 DIAGNOSIS — N186 End stage renal disease: Secondary | ICD-10-CM | POA: Diagnosis not present

## 2016-08-08 DIAGNOSIS — Z23 Encounter for immunization: Secondary | ICD-10-CM | POA: Diagnosis not present

## 2016-08-08 DIAGNOSIS — Z992 Dependence on renal dialysis: Secondary | ICD-10-CM | POA: Diagnosis not present

## 2016-08-09 DIAGNOSIS — Z992 Dependence on renal dialysis: Secondary | ICD-10-CM | POA: Diagnosis not present

## 2016-08-09 DIAGNOSIS — Z23 Encounter for immunization: Secondary | ICD-10-CM | POA: Diagnosis not present

## 2016-08-09 DIAGNOSIS — N186 End stage renal disease: Secondary | ICD-10-CM | POA: Diagnosis not present

## 2016-08-10 DIAGNOSIS — Z992 Dependence on renal dialysis: Secondary | ICD-10-CM | POA: Diagnosis not present

## 2016-08-10 DIAGNOSIS — N186 End stage renal disease: Secondary | ICD-10-CM | POA: Diagnosis not present

## 2016-08-11 DIAGNOSIS — Z992 Dependence on renal dialysis: Secondary | ICD-10-CM | POA: Diagnosis not present

## 2016-08-11 DIAGNOSIS — N186 End stage renal disease: Secondary | ICD-10-CM | POA: Diagnosis not present

## 2016-08-12 DIAGNOSIS — N186 End stage renal disease: Secondary | ICD-10-CM | POA: Diagnosis not present

## 2016-08-12 DIAGNOSIS — Z992 Dependence on renal dialysis: Secondary | ICD-10-CM | POA: Diagnosis not present

## 2016-08-13 DIAGNOSIS — N186 End stage renal disease: Secondary | ICD-10-CM | POA: Diagnosis not present

## 2016-08-13 DIAGNOSIS — Z992 Dependence on renal dialysis: Secondary | ICD-10-CM | POA: Diagnosis not present

## 2016-08-14 ENCOUNTER — Telehealth: Payer: Self-pay | Admitting: Cardiology

## 2016-08-14 DIAGNOSIS — Z992 Dependence on renal dialysis: Secondary | ICD-10-CM | POA: Diagnosis not present

## 2016-08-14 DIAGNOSIS — N186 End stage renal disease: Secondary | ICD-10-CM | POA: Diagnosis not present

## 2016-08-14 NOTE — Telephone Encounter (Signed)
3 attempts to schedule fu from recall list.  lmov to call office from scheduling.  Deleting recall.

## 2016-08-15 DIAGNOSIS — N186 End stage renal disease: Secondary | ICD-10-CM | POA: Diagnosis not present

## 2016-08-15 DIAGNOSIS — Z992 Dependence on renal dialysis: Secondary | ICD-10-CM | POA: Diagnosis not present

## 2016-08-16 DIAGNOSIS — Z992 Dependence on renal dialysis: Secondary | ICD-10-CM | POA: Diagnosis not present

## 2016-08-16 DIAGNOSIS — N186 End stage renal disease: Secondary | ICD-10-CM | POA: Diagnosis not present

## 2016-08-17 DIAGNOSIS — Z992 Dependence on renal dialysis: Secondary | ICD-10-CM | POA: Diagnosis not present

## 2016-08-17 DIAGNOSIS — N186 End stage renal disease: Secondary | ICD-10-CM | POA: Diagnosis not present

## 2016-08-18 DIAGNOSIS — N186 End stage renal disease: Secondary | ICD-10-CM | POA: Diagnosis not present

## 2016-08-18 DIAGNOSIS — Z992 Dependence on renal dialysis: Secondary | ICD-10-CM | POA: Diagnosis not present

## 2016-08-19 DIAGNOSIS — Z992 Dependence on renal dialysis: Secondary | ICD-10-CM | POA: Diagnosis not present

## 2016-08-19 DIAGNOSIS — N186 End stage renal disease: Secondary | ICD-10-CM | POA: Diagnosis not present

## 2016-08-20 DIAGNOSIS — Z992 Dependence on renal dialysis: Secondary | ICD-10-CM | POA: Diagnosis not present

## 2016-08-20 DIAGNOSIS — N186 End stage renal disease: Secondary | ICD-10-CM | POA: Diagnosis not present

## 2016-08-21 DIAGNOSIS — H02834 Dermatochalasis of left upper eyelid: Secondary | ICD-10-CM | POA: Diagnosis not present

## 2016-08-21 DIAGNOSIS — N186 End stage renal disease: Secondary | ICD-10-CM | POA: Diagnosis not present

## 2016-08-21 DIAGNOSIS — H2513 Age-related nuclear cataract, bilateral: Secondary | ICD-10-CM | POA: Diagnosis not present

## 2016-08-21 DIAGNOSIS — H04129 Dry eye syndrome of unspecified lacrimal gland: Secondary | ICD-10-CM | POA: Diagnosis not present

## 2016-08-21 DIAGNOSIS — H04123 Dry eye syndrome of bilateral lacrimal glands: Secondary | ICD-10-CM | POA: Diagnosis not present

## 2016-08-21 DIAGNOSIS — H0289 Other specified disorders of eyelid: Secondary | ICD-10-CM | POA: Diagnosis not present

## 2016-08-21 DIAGNOSIS — Z992 Dependence on renal dialysis: Secondary | ICD-10-CM | POA: Diagnosis not present

## 2016-08-21 DIAGNOSIS — H02831 Dermatochalasis of right upper eyelid: Secondary | ICD-10-CM | POA: Diagnosis not present

## 2016-08-22 DIAGNOSIS — N186 End stage renal disease: Secondary | ICD-10-CM | POA: Diagnosis not present

## 2016-08-22 DIAGNOSIS — Z992 Dependence on renal dialysis: Secondary | ICD-10-CM | POA: Diagnosis not present

## 2016-08-23 DIAGNOSIS — Z992 Dependence on renal dialysis: Secondary | ICD-10-CM | POA: Diagnosis not present

## 2016-08-23 DIAGNOSIS — N186 End stage renal disease: Secondary | ICD-10-CM | POA: Diagnosis not present

## 2016-08-24 DIAGNOSIS — R880 Cloudy (hemodialysis) (peritoneal) dialysis effluent: Secondary | ICD-10-CM | POA: Diagnosis not present

## 2016-08-24 DIAGNOSIS — R109 Unspecified abdominal pain: Secondary | ICD-10-CM | POA: Diagnosis not present

## 2016-08-24 DIAGNOSIS — N186 End stage renal disease: Secondary | ICD-10-CM | POA: Diagnosis not present

## 2016-08-24 DIAGNOSIS — Z992 Dependence on renal dialysis: Secondary | ICD-10-CM | POA: Diagnosis not present

## 2016-08-25 DIAGNOSIS — R880 Cloudy (hemodialysis) (peritoneal) dialysis effluent: Secondary | ICD-10-CM | POA: Diagnosis not present

## 2016-08-25 DIAGNOSIS — N186 End stage renal disease: Secondary | ICD-10-CM | POA: Diagnosis not present

## 2016-08-25 DIAGNOSIS — R109 Unspecified abdominal pain: Secondary | ICD-10-CM | POA: Diagnosis not present

## 2016-08-25 DIAGNOSIS — Z992 Dependence on renal dialysis: Secondary | ICD-10-CM | POA: Diagnosis not present

## 2016-08-26 DIAGNOSIS — Z992 Dependence on renal dialysis: Secondary | ICD-10-CM | POA: Diagnosis not present

## 2016-08-26 DIAGNOSIS — N186 End stage renal disease: Secondary | ICD-10-CM | POA: Diagnosis not present

## 2016-08-26 DIAGNOSIS — R109 Unspecified abdominal pain: Secondary | ICD-10-CM | POA: Diagnosis not present

## 2016-08-26 DIAGNOSIS — R880 Cloudy (hemodialysis) (peritoneal) dialysis effluent: Secondary | ICD-10-CM | POA: Diagnosis not present

## 2016-08-27 DIAGNOSIS — R880 Cloudy (hemodialysis) (peritoneal) dialysis effluent: Secondary | ICD-10-CM | POA: Diagnosis not present

## 2016-08-27 DIAGNOSIS — N186 End stage renal disease: Secondary | ICD-10-CM | POA: Diagnosis not present

## 2016-08-27 DIAGNOSIS — Z992 Dependence on renal dialysis: Secondary | ICD-10-CM | POA: Diagnosis not present

## 2016-08-27 DIAGNOSIS — R109 Unspecified abdominal pain: Secondary | ICD-10-CM | POA: Diagnosis not present

## 2016-08-28 DIAGNOSIS — R109 Unspecified abdominal pain: Secondary | ICD-10-CM | POA: Diagnosis not present

## 2016-08-28 DIAGNOSIS — N186 End stage renal disease: Secondary | ICD-10-CM | POA: Diagnosis not present

## 2016-08-28 DIAGNOSIS — Z992 Dependence on renal dialysis: Secondary | ICD-10-CM | POA: Diagnosis not present

## 2016-08-28 DIAGNOSIS — R880 Cloudy (hemodialysis) (peritoneal) dialysis effluent: Secondary | ICD-10-CM | POA: Diagnosis not present

## 2016-08-29 DIAGNOSIS — H2513 Age-related nuclear cataract, bilateral: Secondary | ICD-10-CM | POA: Diagnosis not present

## 2016-08-29 DIAGNOSIS — N186 End stage renal disease: Secondary | ICD-10-CM | POA: Diagnosis not present

## 2016-08-29 DIAGNOSIS — H04123 Dry eye syndrome of bilateral lacrimal glands: Secondary | ICD-10-CM | POA: Diagnosis not present

## 2016-08-29 DIAGNOSIS — H3581 Retinal edema: Secondary | ICD-10-CM | POA: Diagnosis not present

## 2016-08-29 DIAGNOSIS — R880 Cloudy (hemodialysis) (peritoneal) dialysis effluent: Secondary | ICD-10-CM | POA: Diagnosis not present

## 2016-08-29 DIAGNOSIS — H353231 Exudative age-related macular degeneration, bilateral, with active choroidal neovascularization: Secondary | ICD-10-CM | POA: Diagnosis not present

## 2016-08-29 DIAGNOSIS — Z992 Dependence on renal dialysis: Secondary | ICD-10-CM | POA: Diagnosis not present

## 2016-08-29 DIAGNOSIS — R109 Unspecified abdominal pain: Secondary | ICD-10-CM | POA: Diagnosis not present

## 2016-08-29 DIAGNOSIS — H35713 Central serous chorioretinopathy, bilateral: Secondary | ICD-10-CM | POA: Diagnosis not present

## 2016-08-29 DIAGNOSIS — Z79899 Other long term (current) drug therapy: Secondary | ICD-10-CM | POA: Diagnosis not present

## 2016-08-30 DIAGNOSIS — R109 Unspecified abdominal pain: Secondary | ICD-10-CM | POA: Diagnosis not present

## 2016-08-30 DIAGNOSIS — Z992 Dependence on renal dialysis: Secondary | ICD-10-CM | POA: Diagnosis not present

## 2016-08-30 DIAGNOSIS — N186 End stage renal disease: Secondary | ICD-10-CM | POA: Diagnosis not present

## 2016-08-30 DIAGNOSIS — R880 Cloudy (hemodialysis) (peritoneal) dialysis effluent: Secondary | ICD-10-CM | POA: Diagnosis not present

## 2016-08-31 DIAGNOSIS — N186 End stage renal disease: Secondary | ICD-10-CM | POA: Diagnosis not present

## 2016-08-31 DIAGNOSIS — Z01818 Encounter for other preprocedural examination: Secondary | ICD-10-CM | POA: Diagnosis not present

## 2016-08-31 DIAGNOSIS — R880 Cloudy (hemodialysis) (peritoneal) dialysis effluent: Secondary | ICD-10-CM | POA: Diagnosis not present

## 2016-08-31 DIAGNOSIS — R109 Unspecified abdominal pain: Secondary | ICD-10-CM | POA: Diagnosis not present

## 2016-08-31 DIAGNOSIS — Z992 Dependence on renal dialysis: Secondary | ICD-10-CM | POA: Diagnosis not present

## 2016-09-01 DIAGNOSIS — R880 Cloudy (hemodialysis) (peritoneal) dialysis effluent: Secondary | ICD-10-CM | POA: Diagnosis not present

## 2016-09-01 DIAGNOSIS — N186 End stage renal disease: Secondary | ICD-10-CM | POA: Diagnosis not present

## 2016-09-01 DIAGNOSIS — R109 Unspecified abdominal pain: Secondary | ICD-10-CM | POA: Diagnosis not present

## 2016-09-01 DIAGNOSIS — Z992 Dependence on renal dialysis: Secondary | ICD-10-CM | POA: Diagnosis not present

## 2016-09-02 DIAGNOSIS — R880 Cloudy (hemodialysis) (peritoneal) dialysis effluent: Secondary | ICD-10-CM | POA: Diagnosis not present

## 2016-09-02 DIAGNOSIS — Z992 Dependence on renal dialysis: Secondary | ICD-10-CM | POA: Diagnosis not present

## 2016-09-02 DIAGNOSIS — R109 Unspecified abdominal pain: Secondary | ICD-10-CM | POA: Diagnosis not present

## 2016-09-02 DIAGNOSIS — N186 End stage renal disease: Secondary | ICD-10-CM | POA: Diagnosis not present

## 2016-09-03 DIAGNOSIS — R880 Cloudy (hemodialysis) (peritoneal) dialysis effluent: Secondary | ICD-10-CM | POA: Diagnosis not present

## 2016-09-03 DIAGNOSIS — N186 End stage renal disease: Secondary | ICD-10-CM | POA: Diagnosis not present

## 2016-09-03 DIAGNOSIS — Z992 Dependence on renal dialysis: Secondary | ICD-10-CM | POA: Diagnosis not present

## 2016-09-03 DIAGNOSIS — R109 Unspecified abdominal pain: Secondary | ICD-10-CM | POA: Diagnosis not present

## 2016-09-04 DIAGNOSIS — R109 Unspecified abdominal pain: Secondary | ICD-10-CM | POA: Diagnosis not present

## 2016-09-04 DIAGNOSIS — N186 End stage renal disease: Secondary | ICD-10-CM | POA: Diagnosis not present

## 2016-09-04 DIAGNOSIS — Z992 Dependence on renal dialysis: Secondary | ICD-10-CM | POA: Diagnosis not present

## 2016-09-04 DIAGNOSIS — R880 Cloudy (hemodialysis) (peritoneal) dialysis effluent: Secondary | ICD-10-CM | POA: Diagnosis not present

## 2016-09-05 DIAGNOSIS — Z992 Dependence on renal dialysis: Secondary | ICD-10-CM | POA: Diagnosis not present

## 2016-09-05 DIAGNOSIS — Z01818 Encounter for other preprocedural examination: Secondary | ICD-10-CM | POA: Diagnosis not present

## 2016-09-05 DIAGNOSIS — N186 End stage renal disease: Secondary | ICD-10-CM | POA: Diagnosis not present

## 2016-09-05 DIAGNOSIS — R880 Cloudy (hemodialysis) (peritoneal) dialysis effluent: Secondary | ICD-10-CM | POA: Diagnosis not present

## 2016-09-05 DIAGNOSIS — R109 Unspecified abdominal pain: Secondary | ICD-10-CM | POA: Diagnosis not present

## 2016-09-06 DIAGNOSIS — R9431 Abnormal electrocardiogram [ECG] [EKG]: Secondary | ICD-10-CM | POA: Diagnosis not present

## 2016-09-06 DIAGNOSIS — Z01818 Encounter for other preprocedural examination: Secondary | ICD-10-CM | POA: Diagnosis not present

## 2016-09-06 DIAGNOSIS — N186 End stage renal disease: Secondary | ICD-10-CM | POA: Diagnosis not present

## 2016-09-06 DIAGNOSIS — R109 Unspecified abdominal pain: Secondary | ICD-10-CM | POA: Diagnosis not present

## 2016-09-06 DIAGNOSIS — R880 Cloudy (hemodialysis) (peritoneal) dialysis effluent: Secondary | ICD-10-CM | POA: Diagnosis not present

## 2016-09-06 DIAGNOSIS — Z992 Dependence on renal dialysis: Secondary | ICD-10-CM | POA: Diagnosis not present

## 2016-09-07 DIAGNOSIS — R880 Cloudy (hemodialysis) (peritoneal) dialysis effluent: Secondary | ICD-10-CM | POA: Diagnosis not present

## 2016-09-07 DIAGNOSIS — R109 Unspecified abdominal pain: Secondary | ICD-10-CM | POA: Diagnosis not present

## 2016-09-07 DIAGNOSIS — Z992 Dependence on renal dialysis: Secondary | ICD-10-CM | POA: Diagnosis not present

## 2016-09-07 DIAGNOSIS — N186 End stage renal disease: Secondary | ICD-10-CM | POA: Diagnosis not present

## 2016-09-07 DIAGNOSIS — Z01818 Encounter for other preprocedural examination: Secondary | ICD-10-CM | POA: Diagnosis not present

## 2016-09-08 DIAGNOSIS — R880 Cloudy (hemodialysis) (peritoneal) dialysis effluent: Secondary | ICD-10-CM | POA: Diagnosis not present

## 2016-09-08 DIAGNOSIS — Z992 Dependence on renal dialysis: Secondary | ICD-10-CM | POA: Diagnosis not present

## 2016-09-08 DIAGNOSIS — R109 Unspecified abdominal pain: Secondary | ICD-10-CM | POA: Diagnosis not present

## 2016-09-08 DIAGNOSIS — N186 End stage renal disease: Secondary | ICD-10-CM | POA: Diagnosis not present

## 2016-09-09 DIAGNOSIS — Z992 Dependence on renal dialysis: Secondary | ICD-10-CM | POA: Diagnosis not present

## 2016-09-09 DIAGNOSIS — R880 Cloudy (hemodialysis) (peritoneal) dialysis effluent: Secondary | ICD-10-CM | POA: Diagnosis not present

## 2016-09-09 DIAGNOSIS — N186 End stage renal disease: Secondary | ICD-10-CM | POA: Diagnosis not present

## 2016-09-09 DIAGNOSIS — R109 Unspecified abdominal pain: Secondary | ICD-10-CM | POA: Diagnosis not present

## 2016-09-10 DIAGNOSIS — N186 End stage renal disease: Secondary | ICD-10-CM | POA: Diagnosis not present

## 2016-09-10 DIAGNOSIS — R109 Unspecified abdominal pain: Secondary | ICD-10-CM | POA: Diagnosis not present

## 2016-09-10 DIAGNOSIS — Z992 Dependence on renal dialysis: Secondary | ICD-10-CM | POA: Diagnosis not present

## 2016-09-10 DIAGNOSIS — R880 Cloudy (hemodialysis) (peritoneal) dialysis effluent: Secondary | ICD-10-CM | POA: Diagnosis not present

## 2016-09-11 ENCOUNTER — Other Ambulatory Visit: Payer: Self-pay | Admitting: Family Medicine

## 2016-09-11 DIAGNOSIS — N186 End stage renal disease: Secondary | ICD-10-CM | POA: Diagnosis not present

## 2016-09-11 DIAGNOSIS — R109 Unspecified abdominal pain: Secondary | ICD-10-CM | POA: Diagnosis not present

## 2016-09-11 DIAGNOSIS — R880 Cloudy (hemodialysis) (peritoneal) dialysis effluent: Secondary | ICD-10-CM | POA: Diagnosis not present

## 2016-09-11 DIAGNOSIS — Z992 Dependence on renal dialysis: Secondary | ICD-10-CM | POA: Diagnosis not present

## 2016-09-12 ENCOUNTER — Ambulatory Visit: Payer: Medicare Other | Admitting: Cardiology

## 2016-09-12 DIAGNOSIS — R880 Cloudy (hemodialysis) (peritoneal) dialysis effluent: Secondary | ICD-10-CM | POA: Diagnosis not present

## 2016-09-12 DIAGNOSIS — N186 End stage renal disease: Secondary | ICD-10-CM | POA: Diagnosis not present

## 2016-09-12 DIAGNOSIS — R109 Unspecified abdominal pain: Secondary | ICD-10-CM | POA: Diagnosis not present

## 2016-09-12 DIAGNOSIS — Z992 Dependence on renal dialysis: Secondary | ICD-10-CM | POA: Diagnosis not present

## 2016-09-13 DIAGNOSIS — R880 Cloudy (hemodialysis) (peritoneal) dialysis effluent: Secondary | ICD-10-CM | POA: Diagnosis not present

## 2016-09-13 DIAGNOSIS — N186 End stage renal disease: Secondary | ICD-10-CM | POA: Diagnosis not present

## 2016-09-13 DIAGNOSIS — R109 Unspecified abdominal pain: Secondary | ICD-10-CM | POA: Diagnosis not present

## 2016-09-13 DIAGNOSIS — Z992 Dependence on renal dialysis: Secondary | ICD-10-CM | POA: Diagnosis not present

## 2016-09-14 ENCOUNTER — Encounter: Payer: Self-pay | Admitting: Cardiology

## 2016-09-14 ENCOUNTER — Ambulatory Visit (INDEPENDENT_AMBULATORY_CARE_PROVIDER_SITE_OTHER): Payer: 59 | Admitting: Cardiology

## 2016-09-14 VITALS — BP 124/82 | HR 68 | Ht 71.0 in | Wt 194.5 lb

## 2016-09-14 DIAGNOSIS — Z992 Dependence on renal dialysis: Secondary | ICD-10-CM | POA: Diagnosis not present

## 2016-09-14 DIAGNOSIS — I10 Essential (primary) hypertension: Secondary | ICD-10-CM | POA: Diagnosis not present

## 2016-09-14 DIAGNOSIS — E784 Other hyperlipidemia: Secondary | ICD-10-CM | POA: Diagnosis not present

## 2016-09-14 DIAGNOSIS — N186 End stage renal disease: Secondary | ICD-10-CM | POA: Diagnosis not present

## 2016-09-14 DIAGNOSIS — E859 Amyloidosis, unspecified: Secondary | ICD-10-CM

## 2016-09-14 DIAGNOSIS — Z01810 Encounter for preprocedural cardiovascular examination: Secondary | ICD-10-CM

## 2016-09-14 DIAGNOSIS — E7849 Other hyperlipidemia: Secondary | ICD-10-CM

## 2016-09-14 NOTE — Progress Notes (Signed)
Cardiology Office Note   Date:  09/15/2016   ID:  Joseph [name], DOB Dec 16, 1965, MRN [id number]  Referring Doctor:  Vic Blackbird, MD   Cardiologist:   Wende Bushy, MD   Reason for consultation:  Chief Complaint  Patient presents with  . other    Overdue 6 month follow up.  Needs a new stress test order for transplant. Meds reviewed verbally witt patient.      History of Present Illness: Joseph [name] is a 51 y.o. male who presents for  Preoperative stress test prior to kidney transplant,  Per review of records: Patient has a diagnosis of end-stage renal disease, currently on peritoneal dialysis. They believe it is secondary to amyloidosis.  Since last visit, pt has been doing well. Denies CP, SOB.   He has worked on getting on [employer] for [place], in [place] to the one in [place].   He does peritoneal dialysis every day for 9 hours a day.  ROS:  Please see the history of present illness. Aside from mentioned under HPI, all other systems are reviewed and negative.    Past Medical History:  Diagnosis Date  . Blood clot in abdominal vein    happened around age 64 due to traumatic injury  . Blood dyscrasia    amyloidosis  . Central serous chorioretinopathy   . Dry eye   . GERD (gastroesophageal reflux disease)   . Heart murmur    never followed up with this, no problems  . Hyperlipidemia   . Hypertension    improved since starting dialysis  . Pneumonia    as child  . Renal insufficiency     Past Surgical History:  Procedure Laterality Date  . AORTOGRAM Bilateral 04/11/2015   Procedure: Aortogram;  Surgeon: Katha Cabal, MD;  Location: [place] [facility];  Service: Cardiovascular;  Laterality: Bilateral;  . CAPD INSERTION N/A 07/09/2015   Procedure: LAPAROSCOPIC INSERTION CONTINUOUS AMBULATORY PERITONEAL DIALYSIS  (CAPD) CATHETER;  Surgeon: Katha Cabal, MD;  Location: [facility];  Service: Vascular;  Laterality: N/A;  . COLONOSCOPY WITH PROPOFOL N/A  01/11/2016   Procedure: COLONOSCOPY WITH PROPOFOL;  Surgeon: [name], MD;  Location: [facility];  Service: Endoscopy;  Laterality: N/A;  . EXPLORATORY LAPAROTOMY     done to find and remove abdominal blood clot as a teenager  . IMPLANTATION / PLACEMENT PERMANENT EPIDURAL CATHETER Right 2016  . PERIPHERAL VASCULAR CATHETERIZATION Left 04/11/2015   Procedure: Embolization;  Surgeon: Katha Cabal, MD;  Location: [place] [facility];  Service: Cardiovascular;  Laterality: Left;  . PERIPHERAL VASCULAR CATHETERIZATION N/A 04/16/2015   Procedure: Dialysis/Perma Catheter Insertion;  Surgeon: Katha Cabal, MD;  Location: [place] [facility];  Service: Cardiovascular;  Laterality: N/A;  . PERIPHERAL VASCULAR CATHETERIZATION N/A 08/24/2015   Procedure: Dialysis/Perma Catheter Removal;  Surgeon: Katha Cabal, MD;  Location: [place] [facility];  Service: Cardiovascular;  Laterality: N/A;  . RENAL BIOPSY, PERCUTANEOUS  04/06/2015      . TONSILLECTOMY       reports that he quit smoking about 9 years ago. His smoking use included Cigarettes. He has a 10.00 pack-year smoking history. He has never used smokeless tobacco. He reports that he does not drink alcohol or use drugs.   family history includes Birth defects in his sister; Early death in his maternal uncle; Heart attack in his maternal uncle and maternal uncle; Heart disease in his maternal uncle and maternal uncle; Hypertension in his mother and  other; Multiple sclerosis in his sister.   Maternal uncle died at age 59s,  Thought to be from MI. Another maternal uncle died of massive MI in his 68s to 35s.   Current Outpatient Prescriptions  Medication Sig Dispense Refill  . albuterol (PROAIR HFA) 108 (90 Base) MCG/ACT inhaler Inhale into the lungs.    . Artificial Tear Solution (SOOTHE XP OP) Apply 1 drop to eye as needed.    Marland Kitchen atorvastatin (LIPITOR) 20 MG tablet TAKE 1 TABLET BY MOUTH ONCE DAILY 330 tablet 0  . cycloSPORINE  (RESTASIS) 0.05 % ophthalmic emulsion Place 1 drop into both eyes daily.     Marland Kitchen Epoetin Alfa (EPOGEN IJ) Inject as directed every 30 (thirty) days. Administered at Dialysis center.    . folic acid (FOLVITE) 1 MG tablet Take 5 mg by mouth daily.    Marland Kitchen gentamicin cream (GARAMYCIN) 0.1 %   0  . Hypromellose (ARTIFICIAL TEARS OP) Apply 1 drop to eye as needed.    Marland Kitchen losartan (COZAAR) 100 MG tablet Take 1 tablet (100 mg total) by mouth daily. (Patient taking differently: Take 50 mg by mouth daily. ) 90 tablet 3  . methotrexate (RHEUMATREX) 2.5 MG tablet Take 10 mg by mouth once a week.    . ondansetron (ZOFRAN-ODT) 4 MG disintegrating tablet Take 1 tablet (4 mg total) by mouth every 8 (eight) hours as needed for nausea or vomiting. 30 tablet 0  . pantoprazole (PROTONIX) 40 MG tablet Take 1 tablet (40 mg total) by mouth 2 (two) times daily before a meal. (Patient taking differently: Take 40 mg by mouth daily. ) 60 tablet 0  . VELTASSA 8.4 g packet Take 1 packet by mouth daily.  3   No current facility-administered medications for this visit.     Allergies: Ciprofloxacin hcl; Doxycycline hyclate; and Phenol-glycerin    PHYSICAL EXAM: VS:  BP 124/82 (BP Location: Left Arm, Patient Position: Sitting, Cuff Size: Normal)   Pulse 68   Ht 5\' 11"  (1.803 m)   Wt 194 lb 8 oz (88.2 kg)   BMI 27.13 kg/m  , Body mass index is 27.13 kg/m. Wt Readings from Last 3 Encounters:  09/14/16 194 lb 8 oz (88.2 kg)  06/05/16 192 lb (87.1 kg)  04/03/16 191 lb 0.5 oz (86.7 kg)   GENERAL:  well developed, well nourished, obese, not in acute distress HEENT: normocephalic, pink conjunctivae, anicteric sclerae, no xanthelasma, normal dentition, oropharynx clear NECK:  no neck vein engorgement, JVP normal, no hepatojugular reflux, carotid upstroke brisk and symmetric, no bruit, no thyromegaly, no lymphadenopathy LUNGS:  good respiratory effort, clear to auscultation bilaterally CV:  PMI not displaced, no thrills, no lifts,  S1 and S2 within normal limits, no palpable S3 or S4, no murmurs, no rubs, no gallops ABD:  Soft, nontender, nondistended, normoactive bowel sounds, no abdominal aortic bruit, no hepatomegaly, no splenomegaly MS: nontender back, no kyphosis, no scoliosis, no joint deformities EXT:  2+ DP/PT pulses, no edema, no varicosities, no cyanosis, no clubbing SKIN: warm, nondiaphoretic, normal turgor, no ulcers NEUROPSYCH: alert, oriented to person, place, and time, sensory/motor grossly intact, normal mood, appropriate affect   Recent Labs: 12/30/2015: BUN 58; Creatinine, Ser 5.22; Hemoglobin 8.2; Platelets 285; Potassium 4.5; Sodium 139 03/28/2016: ALT 44   Lipid Panel    Component Value Date/Time   CHOL 140 11/16/2015 1055   TRIG 125 11/16/2015 1055   HDL 27 (L) 11/16/2015 1055   CHOLHDL 5.2 (H) 11/16/2015 1055   VLDL 25  11/16/2015 1055   [place] 88 11/16/2015 1055     Other studies Reviewed:  EKG:  The ekg from 09/23/2015 was personally reviewed by me and it revealed sinus rhythm, 64 BPM, low voltage QRS.  EKG from 09/14/2016 was personally reviewed by me and it revealed sinus rhythm, 60 BPM sinus arrhythmia.  Additional studies/ records that were reviewed personally reviewed by me today include:  Echo 04/13/2015: Study Conclusions  - Left ventricle: The cavity size was normal. Systolic function was  vigorous. The estimated ejection fraction was 75%. Wall motion  was normal; there were no regional wall motion abnormalities.  Doppler parameters are consistent with abnormal left ventricular  relaxation (grade 1 diastolic dysfunction). - Aortic valve: Valve area (Vmax): 4.34 cm^2. - Left atrium: The atrium was mildly dilated. - Atrial septum: No defect or patent foramen ovale was identified.  Ex nuc stress test 10/01/2015:  There was no ST segment deviation noted during stress.  Defect 1: There is a small defect of mild severity present in the apex location. Normal wall motion.  This likely represent an artifact.  The study is normal.  This is a low risk study.  The left ventricular ejection fraction is normal (55-65%).  ASSESSMENT AND PLAN:   cardiac evaluation prior to consideration for renal transplantation  hypertension  hyperlipidemia  history of possible premature coronary artery disease in the family  We will set him up for an ex nuc stress test as requirement for evaluation for transplant   Hypertension BP is well controlled. Continue monitoring BP. Continue current medical therapy and lifestyle changes.    hyperlipidemia Lipid levels at goal (LDL < 70). Continue current medical therapy and lifestyle changes.PCP following labs.   Rec the same as per last visit 10/13/2015: in terms of amyloidosis, patient being followed by hematology , as well as nephrology. Traditionally, a cardiac MRI is  Diagnostic modality of choice to evaluate for cardiac amyloidosis. However this is not recommended in end-stage renal disease. I suppose, patient will be further evaluated by transplant team at [facility].  Other options may be RV biopsy or FDG PET.  Current medicines are reviewed at length with the patient today.  The patient does not have concerns regarding medicines.  Labs/ tests ordered today include:  Orders Placed This Encounter  Procedures  . NM Myocar Multi W/Spect W/Wall Motion / EF    I had a lengthy and detailed discussion with the patient regarding diagnoses, prognosis, diagnostic options, treatment options.   I counseled the patient on importance of lifestyle modification including heart healthy diet, regular physical activity .   Disposition:   FU with Cardiology as necessary  Signed, Wende Bushy, MD  09/15/2016 7:23 AM    [place]

## 2016-09-14 NOTE — Patient Instructions (Addendum)
Testing/Procedures: [place]  Your caregiver has ordered a Stress Test with nuclear imaging. The purpose of this test is to evaluate the blood supply to your heart muscle. This procedure is referred to as a "Non-Invasive Stress Test." This is because other than having an IV started in your vein, nothing is inserted or "invades" your body. Cardiac stress tests are done to find areas of poor blood flow to the heart by determining the extent of coronary artery disease (CAD).    Please note: these test may take anywhere between 2-4 hours to complete  PLEASE REPORT TO [facility] [place] AT THE FIRST DESK WILL DIRECT YOU WHERE TO GO  Date of Procedure:_Thursday September 21, 2016 at 09:30AM__  Arrival Time for Procedure:_Arrive at 09:15AM to register_    PLEASE NOTIFY THE OFFICE AT LEAST 24 HOURS IN ADVANCE IF YOU ARE UNABLE TO [place].  [phone] AND  PLEASE NOTIFY NUCLEAR MEDICINE AT [facility] AT LEAST 24 HOURS IN ADVANCE IF YOU ARE UNABLE TO KEEP YOUR APPOINTMENT. [phone]  How to prepare for your Myoview test:  1. Do not eat or drink after midnight 2. No caffeine for 24 hours prior to test 3. No smoking 24 hours prior to test. 4. Your medication may be taken with water.  If your doctor stopped a medication because of this test, do not take that medication. 5. Ladies, please do not wear dresses.  Skirts or pants are appropriate. Please wear a short sleeve shirt. 6. No perfume, cologne or lotion. 7. Wear comfortable walking shoes. No heels!   Follow-Up: Your physician wants you to follow-up in: 1 year. You will receive a reminder letter in the mail two months in advance. If you don't receive a letter, please call our office to schedule the follow-up appointment.  It was a pleasure seeing you today here in the office. Please do not hesitate to give [place] a call back if you have any further questions. [place], BSN    Cardiac  Nuclear Scan A cardiac nuclear scan is a test that measures blood flow to the heart when a person is resting and when he or she is exercising. The test looks for problems such as:  Not enough blood reaching a portion of the heart.  The heart muscle not working normally. You may need this test if:  You have heart disease.  You have had abnormal lab results.  You have had heart surgery or angioplasty.  You have chest pain.  You have shortness of breath. In this test, a radioactive dye (tracer) is injected into your bloodstream. After the tracer has traveled to your heart, an imaging device is used to measure how much of the tracer is absorbed by or distributed to various areas of your heart. This procedure is usually done at a hospital and takes 2-4 hours. Tell a health care provider about:  Any allergies you have.  All medicines you are taking, including vitamins, herbs, eye drops, creams, and over-the-counter medicines.  Any problems you or family members have had with the use of anesthetic medicines.  Any blood disorders you have.  Any surgeries you have had.  Any medical conditions you have.  Whether you are pregnant or may be pregnant. What are the risks? Generally, this is a safe procedure. However, problems may occur, including:  Serious chest pain and heart attack. This is only a risk if the stress portion of the test is done.  Rapid heartbeat.  Sensation of warmth in your chest. This usually passes quickly. What happens before the procedure?  Ask your health care provider about changing or stopping your regular medicines. This is especially important if you are taking diabetes medicines or blood thinners.  Remove your jewelry on the day of the procedure. What happens during the procedure?  An IV tube will be inserted into one of your veins.  Your health care provider will inject a small amount of radioactive tracer through the tube.  You will wait for 20-40  minutes while the tracer travels through your bloodstream.  Your heart activity will be monitored with an electrocardiogram (ECG).  You will lie down on an exam table.  Images of your heart will be taken for about 15-20 minutes.  You may be asked to exercise on a treadmill or stationary bike. While you exercise, your heart's activity will be monitored with an ECG, and your blood pressure will be checked. If you are unable to exercise, you may be given a medicine to increase blood flow to parts of your heart.  When blood flow to your heart has peaked, a tracer will again be injected through the IV tube.  After 20-40 minutes, you will get back on the exam table and have more images taken of your heart.  When the procedure is over, your IV tube will be removed. The procedure may vary among health care providers and hospitals. Depending on the type of tracer used, scans may need to be repeated 3-4 hours later. What happens after the procedure?  Unless your health care provider tells you otherwise, you may return to your normal schedule, including diet, activities, and medicines.  Unless your health care provider tells you otherwise, you may increase your fluid intake. This will help flush the contrast dye from your body. Drink enough fluid to keep your urine clear or pale yellow.  It is up to you to get your test results. Ask your health care provider, or the department that is doing the test, when your results will be ready. Summary  A cardiac nuclear scan measures the blood flow to the heart when a person is resting and when he or she is exercising.  You may need this test if you are at risk for heart disease.  Tell your health care provider if you are pregnant.  Unless your health care provider tells you otherwise, increase your fluid intake. This will help flush the contrast dye from your body. Drink enough fluid to keep your urine clear or pale yellow. This information is not  intended to replace advice given to you by your health care provider. Make sure you discuss any questions you have with your health care provider. Document Released: 07/07/2004 Document Revised: 06/14/2016 Document Reviewed: 05/21/2013 Elsevier Interactive Patient Education  2017 [employer].

## 2016-09-15 ENCOUNTER — Telehealth: Payer: Self-pay | Admitting: Cardiology

## 2016-09-15 DIAGNOSIS — N186 End stage renal disease: Secondary | ICD-10-CM | POA: Diagnosis not present

## 2016-09-15 DIAGNOSIS — Z992 Dependence on renal dialysis: Secondary | ICD-10-CM | POA: Diagnosis not present

## 2016-09-15 NOTE — Telephone Encounter (Signed)
Patient wants to rs nm study  for after 10-08-16 . Please call

## 2016-09-15 NOTE — Telephone Encounter (Signed)
Rescheduled stress test for May 2018 per patient request. He verbalized understanding and had no further questions at this time.

## 2016-09-15 NOTE — Addendum Note (Signed)
Addended by: Kittie Plater on: 09/15/2016 09:56 AM   Modules accepted: Orders

## 2016-09-16 DIAGNOSIS — Z992 Dependence on renal dialysis: Secondary | ICD-10-CM | POA: Diagnosis not present

## 2016-09-16 DIAGNOSIS — N186 End stage renal disease: Secondary | ICD-10-CM | POA: Diagnosis not present

## 2016-09-17 DIAGNOSIS — Z992 Dependence on renal dialysis: Secondary | ICD-10-CM | POA: Diagnosis not present

## 2016-09-17 DIAGNOSIS — N186 End stage renal disease: Secondary | ICD-10-CM | POA: Diagnosis not present

## 2016-09-18 DIAGNOSIS — N186 End stage renal disease: Secondary | ICD-10-CM | POA: Diagnosis not present

## 2016-09-18 DIAGNOSIS — Z992 Dependence on renal dialysis: Secondary | ICD-10-CM | POA: Diagnosis not present

## 2016-09-19 DIAGNOSIS — N186 End stage renal disease: Secondary | ICD-10-CM | POA: Diagnosis not present

## 2016-09-19 DIAGNOSIS — Z992 Dependence on renal dialysis: Secondary | ICD-10-CM | POA: Diagnosis not present

## 2016-09-20 DIAGNOSIS — N186 End stage renal disease: Secondary | ICD-10-CM | POA: Diagnosis not present

## 2016-09-20 DIAGNOSIS — Z992 Dependence on renal dialysis: Secondary | ICD-10-CM | POA: Diagnosis not present

## 2016-09-21 ENCOUNTER — Other Ambulatory Visit: Payer: Medicare Other

## 2016-09-21 DIAGNOSIS — Z992 Dependence on renal dialysis: Secondary | ICD-10-CM | POA: Diagnosis not present

## 2016-09-21 DIAGNOSIS — N186 End stage renal disease: Secondary | ICD-10-CM | POA: Diagnosis not present

## 2016-09-22 DIAGNOSIS — Z992 Dependence on renal dialysis: Secondary | ICD-10-CM | POA: Diagnosis not present

## 2016-09-22 DIAGNOSIS — N186 End stage renal disease: Secondary | ICD-10-CM | POA: Diagnosis not present

## 2016-09-23 DIAGNOSIS — N186 End stage renal disease: Secondary | ICD-10-CM | POA: Diagnosis not present

## 2016-09-23 DIAGNOSIS — Z992 Dependence on renal dialysis: Secondary | ICD-10-CM | POA: Diagnosis not present

## 2016-09-24 DIAGNOSIS — Z23 Encounter for immunization: Secondary | ICD-10-CM | POA: Diagnosis not present

## 2016-09-24 DIAGNOSIS — Z992 Dependence on renal dialysis: Secondary | ICD-10-CM | POA: Diagnosis not present

## 2016-09-24 DIAGNOSIS — N186 End stage renal disease: Secondary | ICD-10-CM | POA: Diagnosis not present

## 2016-09-25 DIAGNOSIS — Z23 Encounter for immunization: Secondary | ICD-10-CM | POA: Diagnosis not present

## 2016-09-25 DIAGNOSIS — Z992 Dependence on renal dialysis: Secondary | ICD-10-CM | POA: Diagnosis not present

## 2016-09-25 DIAGNOSIS — N186 End stage renal disease: Secondary | ICD-10-CM | POA: Diagnosis not present

## 2016-09-26 DIAGNOSIS — Z23 Encounter for immunization: Secondary | ICD-10-CM | POA: Diagnosis not present

## 2016-09-26 DIAGNOSIS — N186 End stage renal disease: Secondary | ICD-10-CM | POA: Diagnosis not present

## 2016-09-26 DIAGNOSIS — Z992 Dependence on renal dialysis: Secondary | ICD-10-CM | POA: Diagnosis not present

## 2016-09-27 DIAGNOSIS — Z23 Encounter for immunization: Secondary | ICD-10-CM | POA: Diagnosis not present

## 2016-09-27 DIAGNOSIS — N186 End stage renal disease: Secondary | ICD-10-CM | POA: Diagnosis not present

## 2016-09-27 DIAGNOSIS — Z992 Dependence on renal dialysis: Secondary | ICD-10-CM | POA: Diagnosis not present

## 2016-09-28 DIAGNOSIS — N186 End stage renal disease: Secondary | ICD-10-CM | POA: Diagnosis not present

## 2016-09-28 DIAGNOSIS — Z992 Dependence on renal dialysis: Secondary | ICD-10-CM | POA: Diagnosis not present

## 2016-09-28 DIAGNOSIS — Z23 Encounter for immunization: Secondary | ICD-10-CM | POA: Diagnosis not present

## 2016-09-29 DIAGNOSIS — Z992 Dependence on renal dialysis: Secondary | ICD-10-CM | POA: Diagnosis not present

## 2016-09-29 DIAGNOSIS — E7800 Pure hypercholesterolemia, unspecified: Secondary | ICD-10-CM | POA: Diagnosis not present

## 2016-09-29 DIAGNOSIS — Z23 Encounter for immunization: Secondary | ICD-10-CM | POA: Diagnosis not present

## 2016-09-29 DIAGNOSIS — N186 End stage renal disease: Secondary | ICD-10-CM | POA: Diagnosis not present

## 2016-09-30 DIAGNOSIS — Z23 Encounter for immunization: Secondary | ICD-10-CM | POA: Diagnosis not present

## 2016-09-30 DIAGNOSIS — N186 End stage renal disease: Secondary | ICD-10-CM | POA: Diagnosis not present

## 2016-09-30 DIAGNOSIS — Z992 Dependence on renal dialysis: Secondary | ICD-10-CM | POA: Diagnosis not present

## 2016-10-01 DIAGNOSIS — Z992 Dependence on renal dialysis: Secondary | ICD-10-CM | POA: Diagnosis not present

## 2016-10-01 DIAGNOSIS — N186 End stage renal disease: Secondary | ICD-10-CM | POA: Diagnosis not present

## 2016-10-02 ENCOUNTER — Encounter: Payer: Self-pay | Admitting: Physician Assistant

## 2016-10-02 ENCOUNTER — Ambulatory Visit (INDEPENDENT_AMBULATORY_CARE_PROVIDER_SITE_OTHER): Payer: Medicare Other | Admitting: Physician Assistant

## 2016-10-02 VITALS — BP 118/88 | HR 110 | Temp 98.2°F | Resp 18 | Wt 191.4 lb

## 2016-10-02 DIAGNOSIS — B9689 Other specified bacterial agents as the cause of diseases classified elsewhere: Secondary | ICD-10-CM

## 2016-10-02 DIAGNOSIS — Z992 Dependence on renal dialysis: Secondary | ICD-10-CM | POA: Diagnosis not present

## 2016-10-02 DIAGNOSIS — J988 Other specified respiratory disorders: Secondary | ICD-10-CM

## 2016-10-02 DIAGNOSIS — N186 End stage renal disease: Secondary | ICD-10-CM | POA: Diagnosis not present

## 2016-10-02 MED ORDER — HYDROCODONE-HOMATROPINE 5-1.5 MG/5ML PO SYRP: 5.0000 mL | ORAL_SOLUTION | Freq: Three times a day (TID) | ORAL | 0 refills | Status: DC | PRN

## 2016-10-02 MED ORDER — AZITHROMYCIN 250 MG PO TABS: ORAL_TABLET | ORAL | 0 refills | Status: DC

## 2016-10-02 NOTE — Progress Notes (Signed)
Patient ID: Joseph [name] MRN: [id number], DOB: 1966-01-18, 51 y.o. Date of Encounter: 10/02/2016, 11:29 AM    Chief Complaint:  Chief Complaint  Patient presents with  . Cough    x9 days     HPI: 51 y.o. year old male presents with above.   Also had some runny nose but otherwise has mostly just been having cough which has been going on for 9 days. No fevers. No significant sore throat. No ear ache. No other concerns to address today.     Home Meds:   Outpatient Medications Prior to Visit  Medication Sig Dispense Refill  . albuterol (PROAIR HFA) 108 (90 Base) MCG/ACT inhaler Inhale into the lungs.    . Artificial Tear Solution (SOOTHE XP OP) Apply 1 drop to eye as needed.    Marland Kitchen atorvastatin (LIPITOR) 20 MG tablet TAKE 1 TABLET BY MOUTH ONCE DAILY 330 tablet 0  . cycloSPORINE (RESTASIS) 0.05 % ophthalmic emulsion Place 1 drop into both eyes daily.     Marland Kitchen Epoetin Alfa (EPOGEN IJ) Inject as directed every 30 (thirty) days. Administered at Dialysis center.    . folic acid (FOLVITE) 1 MG tablet Take 5 mg by mouth daily.    Marland Kitchen gentamicin cream (GARAMYCIN) 0.1 %   0  . Hypromellose (ARTIFICIAL TEARS OP) Apply 1 drop to eye as needed.    Marland Kitchen losartan (COZAAR) 100 MG tablet Take 1 tablet (100 mg total) by mouth daily. (Patient taking differently: Take 50 mg by mouth daily. ) 90 tablet 3  . methotrexate (RHEUMATREX) 2.5 MG tablet Take 10 mg by mouth once a week.    . ondansetron (ZOFRAN-ODT) 4 MG disintegrating tablet Take 1 tablet (4 mg total) by mouth every 8 (eight) hours as needed for nausea or vomiting. 30 tablet 0  . pantoprazole (PROTONIX) 40 MG tablet Take 1 tablet (40 mg total) by mouth 2 (two) times daily before a meal. (Patient taking differently: Take 40 mg by mouth daily. ) 60 tablet 0  . VELTASSA 8.4 g packet Take 1 packet by mouth daily.  3   No facility-administered medications prior to visit.     Allergies:  Allergies  Allergen Reactions  . Ciprofloxacin Hcl Swelling   High fever  . Doxycycline Hyclate Swelling  . Phenol-Glycerin Swelling      Review of Systems: See HPI for pertinent ROS. All other ROS negative.    Physical Exam: Blood pressure 118/88, pulse (!) 110, temperature 98.2 F (36.8 C), temperature source Oral, resp. rate 18, weight 191 lb 6.4 oz (86.8 kg), SpO2 98 %., Body mass index is 26.69 kg/m. General:  WM. Appears in no acute distress. HEENT: Normocephalic, atraumatic, eyes without discharge, sclera non-icteric, nares are without discharge. Bilateral auditory canals clear, TM's are without perforation, pearly grey and translucent with reflective cone of light bilaterally. Oral cavity moist, posterior pharynx without exudate, erythema, peritonsillar abscess.  Neck: Supple. No thyromegaly. No lymphadenopathy. Lungs: Clear bilaterally to auscultation without wheezes, rales, or rhonchi. Breathing is unlabored. Heart: Regular rhythm. No murmurs, rubs, or gallops. Msk:  Strength and tone normal for age. Extremities/Skin: Warm and dry.  Neuro: Alert and oriented X 3. Moves all extremities spontaneously. Gait is normal. CNII-XII grossly in tact. Psych:  Responds to questions appropriately with a normal affect.     ASSESSMENT AND PLAN:  51 y.o. year old male with  1. Bacterial respiratory infection Is to take the antibiotic as directed. He can use the Hycodan as needed for  cough suppressant. Follow-up if symptoms do not resolve within 1 week after completion of antibiotic. - azithromycin (ZITHROMAX) 250 MG tablet; Day 1: Take 2 daily. Days 2 -5: Take 1 daily.  Dispense: 6 tablet; Refill: 0 - HYDROcodone-homatropine (HYCODAN) 5-1.5 MG/5ML syrup; Take 5 mLs by mouth every 8 (eight) hours as needed for cough.  Dispense: 120 mL; Refill: 0   Signed, [address] [place], [place], [facility] 10/02/2016 11:29 AM

## 2016-10-03 DIAGNOSIS — N186 End stage renal disease: Secondary | ICD-10-CM | POA: Diagnosis not present

## 2016-10-03 DIAGNOSIS — Z992 Dependence on renal dialysis: Secondary | ICD-10-CM | POA: Diagnosis not present

## 2016-10-04 DIAGNOSIS — Z992 Dependence on renal dialysis: Secondary | ICD-10-CM | POA: Diagnosis not present

## 2016-10-04 DIAGNOSIS — N186 End stage renal disease: Secondary | ICD-10-CM | POA: Diagnosis not present

## 2016-10-05 DIAGNOSIS — N186 End stage renal disease: Secondary | ICD-10-CM | POA: Diagnosis not present

## 2016-10-05 DIAGNOSIS — Z992 Dependence on renal dialysis: Secondary | ICD-10-CM | POA: Diagnosis not present

## 2016-10-06 DIAGNOSIS — Z992 Dependence on renal dialysis: Secondary | ICD-10-CM | POA: Diagnosis not present

## 2016-10-06 DIAGNOSIS — N186 End stage renal disease: Secondary | ICD-10-CM | POA: Diagnosis not present

## 2016-10-07 DIAGNOSIS — N186 End stage renal disease: Secondary | ICD-10-CM | POA: Diagnosis not present

## 2016-10-07 DIAGNOSIS — Z992 Dependence on renal dialysis: Secondary | ICD-10-CM | POA: Diagnosis not present

## 2016-10-08 DIAGNOSIS — N186 End stage renal disease: Secondary | ICD-10-CM | POA: Diagnosis not present

## 2016-10-08 DIAGNOSIS — Z992 Dependence on renal dialysis: Secondary | ICD-10-CM | POA: Diagnosis not present

## 2016-10-09 DIAGNOSIS — N186 End stage renal disease: Secondary | ICD-10-CM | POA: Diagnosis not present

## 2016-10-09 DIAGNOSIS — Z992 Dependence on renal dialysis: Secondary | ICD-10-CM | POA: Diagnosis not present

## 2016-10-10 DIAGNOSIS — N186 End stage renal disease: Secondary | ICD-10-CM | POA: Diagnosis not present

## 2016-10-10 DIAGNOSIS — Z992 Dependence on renal dialysis: Secondary | ICD-10-CM | POA: Diagnosis not present

## 2016-10-11 DIAGNOSIS — Z992 Dependence on renal dialysis: Secondary | ICD-10-CM | POA: Diagnosis not present

## 2016-10-11 DIAGNOSIS — N186 End stage renal disease: Secondary | ICD-10-CM | POA: Diagnosis not present

## 2016-10-12 DIAGNOSIS — Z992 Dependence on renal dialysis: Secondary | ICD-10-CM | POA: Diagnosis not present

## 2016-10-12 DIAGNOSIS — N186 End stage renal disease: Secondary | ICD-10-CM | POA: Diagnosis not present

## 2016-10-13 DIAGNOSIS — Z992 Dependence on renal dialysis: Secondary | ICD-10-CM | POA: Diagnosis not present

## 2016-10-13 DIAGNOSIS — N186 End stage renal disease: Secondary | ICD-10-CM | POA: Diagnosis not present

## 2016-10-14 DIAGNOSIS — N186 End stage renal disease: Secondary | ICD-10-CM | POA: Diagnosis not present

## 2016-10-14 DIAGNOSIS — Z992 Dependence on renal dialysis: Secondary | ICD-10-CM | POA: Diagnosis not present

## 2016-10-15 DIAGNOSIS — N186 End stage renal disease: Secondary | ICD-10-CM | POA: Diagnosis not present

## 2016-10-15 DIAGNOSIS — Z992 Dependence on renal dialysis: Secondary | ICD-10-CM | POA: Diagnosis not present

## 2016-10-16 DIAGNOSIS — N186 End stage renal disease: Secondary | ICD-10-CM | POA: Diagnosis not present

## 2016-10-16 DIAGNOSIS — Z992 Dependence on renal dialysis: Secondary | ICD-10-CM | POA: Diagnosis not present

## 2016-10-17 DIAGNOSIS — Z7682 Awaiting organ transplant status: Secondary | ICD-10-CM | POA: Diagnosis not present

## 2016-10-17 DIAGNOSIS — N186 End stage renal disease: Secondary | ICD-10-CM | POA: Diagnosis not present

## 2016-10-17 DIAGNOSIS — Z992 Dependence on renal dialysis: Secondary | ICD-10-CM | POA: Diagnosis not present

## 2016-10-18 DIAGNOSIS — N186 End stage renal disease: Secondary | ICD-10-CM | POA: Diagnosis not present

## 2016-10-18 DIAGNOSIS — Z992 Dependence on renal dialysis: Secondary | ICD-10-CM | POA: Diagnosis not present

## 2016-10-19 DIAGNOSIS — Z992 Dependence on renal dialysis: Secondary | ICD-10-CM | POA: Diagnosis not present

## 2016-10-19 DIAGNOSIS — N186 End stage renal disease: Secondary | ICD-10-CM | POA: Diagnosis not present

## 2016-10-20 DIAGNOSIS — N186 End stage renal disease: Secondary | ICD-10-CM | POA: Diagnosis not present

## 2016-10-20 DIAGNOSIS — Z992 Dependence on renal dialysis: Secondary | ICD-10-CM | POA: Diagnosis not present

## 2016-10-21 DIAGNOSIS — N186 End stage renal disease: Secondary | ICD-10-CM | POA: Diagnosis not present

## 2016-10-21 DIAGNOSIS — Z992 Dependence on renal dialysis: Secondary | ICD-10-CM | POA: Diagnosis not present

## 2016-10-22 DIAGNOSIS — Z992 Dependence on renal dialysis: Secondary | ICD-10-CM | POA: Diagnosis not present

## 2016-10-22 DIAGNOSIS — N186 End stage renal disease: Secondary | ICD-10-CM | POA: Diagnosis not present

## 2016-10-23 DIAGNOSIS — N186 End stage renal disease: Secondary | ICD-10-CM | POA: Diagnosis not present

## 2016-10-23 DIAGNOSIS — Z992 Dependence on renal dialysis: Secondary | ICD-10-CM | POA: Diagnosis not present

## 2016-10-24 DIAGNOSIS — N186 End stage renal disease: Secondary | ICD-10-CM | POA: Diagnosis not present

## 2016-10-24 DIAGNOSIS — Z992 Dependence on renal dialysis: Secondary | ICD-10-CM | POA: Diagnosis not present

## 2016-10-25 DIAGNOSIS — H52203 Unspecified astigmatism, bilateral: Secondary | ICD-10-CM | POA: Diagnosis not present

## 2016-10-25 DIAGNOSIS — H0289 Other specified disorders of eyelid: Secondary | ICD-10-CM | POA: Diagnosis not present

## 2016-10-25 DIAGNOSIS — N186 End stage renal disease: Secondary | ICD-10-CM | POA: Diagnosis not present

## 2016-10-25 DIAGNOSIS — Z992 Dependence on renal dialysis: Secondary | ICD-10-CM | POA: Diagnosis not present

## 2016-10-25 DIAGNOSIS — H04123 Dry eye syndrome of bilateral lacrimal glands: Secondary | ICD-10-CM | POA: Diagnosis not present

## 2016-10-25 DIAGNOSIS — H5203 Hypermetropia, bilateral: Secondary | ICD-10-CM | POA: Diagnosis not present

## 2016-10-25 DIAGNOSIS — H524 Presbyopia: Secondary | ICD-10-CM | POA: Diagnosis not present

## 2016-10-25 DIAGNOSIS — H2513 Age-related nuclear cataract, bilateral: Secondary | ICD-10-CM | POA: Diagnosis not present

## 2016-10-25 DIAGNOSIS — E859 Amyloidosis, unspecified: Secondary | ICD-10-CM | POA: Diagnosis not present

## 2016-10-26 ENCOUNTER — Encounter
Admission: RE | Admit: 2016-10-26 | Discharge: 2016-10-26 | Disposition: A | Discharge status: Home / Self Care | Payer: 59 | Source: Ambulatory Visit | Attending: Cardiology | Admitting: Cardiology

## 2016-10-26 DIAGNOSIS — N186 End stage renal disease: Secondary | ICD-10-CM | POA: Diagnosis not present

## 2016-10-26 DIAGNOSIS — Z01810 Encounter for preprocedural cardiovascular examination: Secondary | ICD-10-CM | POA: Insufficient documentation

## 2016-10-26 DIAGNOSIS — Z992 Dependence on renal dialysis: Secondary | ICD-10-CM | POA: Diagnosis not present

## 2016-10-26 LAB — NM MYOCAR MULTI W/SPECT W/WALL MOTION / EF
CHL CUP NUCLEAR SRS: 1
CHL CUP RESTING HR STRESS: 68 {beats}/min
CSEPED: 5 min
CSEPEDS: 40 s
CSEPEW: 7 METS
CSEPPHR: 150 {beats}/min
LV dias vol: 62 mL (ref 62–150)
LVSYSVOL: 24 mL
Percent HR: 89 %
SDS: 0
SSS: 2
TID: 0.83

## 2016-10-26 MED ORDER — TECHNETIUM TC 99M TETROFOSMIN IV KIT
28.2400 | PACK | Freq: Once | INTRAVENOUS | Status: AC | PRN
  Administered 2016-10-26: 28.24 via INTRAVENOUS

## 2016-10-26 MED ORDER — TECHNETIUM TC 99M TETROFOSMIN IV KIT
13.0000 | PACK | Freq: Once | INTRAVENOUS | Status: AC | PRN
  Administered 2016-10-26: 12.459 via INTRAVENOUS

## 2016-10-27 DIAGNOSIS — N186 End stage renal disease: Secondary | ICD-10-CM | POA: Diagnosis not present

## 2016-10-27 DIAGNOSIS — Z992 Dependence on renal dialysis: Secondary | ICD-10-CM | POA: Diagnosis not present

## 2016-10-28 DIAGNOSIS — N186 End stage renal disease: Secondary | ICD-10-CM | POA: Diagnosis not present

## 2016-10-28 DIAGNOSIS — Z992 Dependence on renal dialysis: Secondary | ICD-10-CM | POA: Diagnosis not present

## 2016-10-29 DIAGNOSIS — N186 End stage renal disease: Secondary | ICD-10-CM | POA: Diagnosis not present

## 2016-10-29 DIAGNOSIS — Z992 Dependence on renal dialysis: Secondary | ICD-10-CM | POA: Diagnosis not present

## 2016-10-30 ENCOUNTER — Encounter: Payer: Self-pay | Admitting: Cardiology

## 2016-10-30 DIAGNOSIS — N186 End stage renal disease: Secondary | ICD-10-CM | POA: Diagnosis not present

## 2016-10-30 DIAGNOSIS — Z992 Dependence on renal dialysis: Secondary | ICD-10-CM | POA: Diagnosis not present

## 2016-10-31 DIAGNOSIS — Z992 Dependence on renal dialysis: Secondary | ICD-10-CM | POA: Diagnosis not present

## 2016-10-31 DIAGNOSIS — H35713 Central serous chorioretinopathy, bilateral: Secondary | ICD-10-CM | POA: Diagnosis not present

## 2016-10-31 DIAGNOSIS — H04123 Dry eye syndrome of bilateral lacrimal glands: Secondary | ICD-10-CM | POA: Diagnosis not present

## 2016-10-31 DIAGNOSIS — H353231 Exudative age-related macular degeneration, bilateral, with active choroidal neovascularization: Secondary | ICD-10-CM | POA: Diagnosis not present

## 2016-10-31 DIAGNOSIS — H2513 Age-related nuclear cataract, bilateral: Secondary | ICD-10-CM | POA: Diagnosis not present

## 2016-10-31 DIAGNOSIS — H3581 Retinal edema: Secondary | ICD-10-CM | POA: Diagnosis not present

## 2016-10-31 DIAGNOSIS — N186 End stage renal disease: Secondary | ICD-10-CM | POA: Diagnosis not present

## 2016-10-31 DIAGNOSIS — Z23 Encounter for immunization: Secondary | ICD-10-CM | POA: Diagnosis not present

## 2016-10-31 DIAGNOSIS — Z79899 Other long term (current) drug therapy: Secondary | ICD-10-CM | POA: Diagnosis not present

## 2016-11-01 DIAGNOSIS — H35713 Central serous chorioretinopathy, bilateral: Secondary | ICD-10-CM | POA: Diagnosis not present

## 2016-11-01 DIAGNOSIS — N289 Disorder of kidney and ureter, unspecified: Secondary | ICD-10-CM | POA: Diagnosis not present

## 2016-11-01 DIAGNOSIS — Z23 Encounter for immunization: Secondary | ICD-10-CM | POA: Diagnosis not present

## 2016-11-01 DIAGNOSIS — Z992 Dependence on renal dialysis: Secondary | ICD-10-CM | POA: Diagnosis not present

## 2016-11-01 DIAGNOSIS — N186 End stage renal disease: Secondary | ICD-10-CM | POA: Diagnosis not present

## 2016-11-01 DIAGNOSIS — Z6826 Body mass index (BMI) 26.0-26.9, adult: Secondary | ICD-10-CM | POA: Diagnosis not present

## 2016-11-01 DIAGNOSIS — E854 Organ-limited amyloidosis: Secondary | ICD-10-CM | POA: Diagnosis not present

## 2016-11-01 DIAGNOSIS — E852 Heredofamilial amyloidosis, unspecified: Secondary | ICD-10-CM | POA: Diagnosis not present

## 2016-11-02 DIAGNOSIS — N186 End stage renal disease: Secondary | ICD-10-CM | POA: Diagnosis not present

## 2016-11-02 DIAGNOSIS — Z23 Encounter for immunization: Secondary | ICD-10-CM | POA: Diagnosis not present

## 2016-11-02 DIAGNOSIS — Z992 Dependence on renal dialysis: Secondary | ICD-10-CM | POA: Diagnosis not present

## 2016-11-03 DIAGNOSIS — Z23 Encounter for immunization: Secondary | ICD-10-CM | POA: Diagnosis not present

## 2016-11-03 DIAGNOSIS — N186 End stage renal disease: Secondary | ICD-10-CM | POA: Diagnosis not present

## 2016-11-03 DIAGNOSIS — Z992 Dependence on renal dialysis: Secondary | ICD-10-CM | POA: Diagnosis not present

## 2016-11-04 DIAGNOSIS — Z23 Encounter for immunization: Secondary | ICD-10-CM | POA: Diagnosis not present

## 2016-11-04 DIAGNOSIS — N186 End stage renal disease: Secondary | ICD-10-CM | POA: Diagnosis not present

## 2016-11-04 DIAGNOSIS — Z992 Dependence on renal dialysis: Secondary | ICD-10-CM | POA: Diagnosis not present

## 2016-11-05 DIAGNOSIS — Z23 Encounter for immunization: Secondary | ICD-10-CM | POA: Diagnosis not present

## 2016-11-05 DIAGNOSIS — Z992 Dependence on renal dialysis: Secondary | ICD-10-CM | POA: Diagnosis not present

## 2016-11-05 DIAGNOSIS — N186 End stage renal disease: Secondary | ICD-10-CM | POA: Diagnosis not present

## 2016-11-06 DIAGNOSIS — N186 End stage renal disease: Secondary | ICD-10-CM | POA: Diagnosis not present

## 2016-11-06 DIAGNOSIS — Z23 Encounter for immunization: Secondary | ICD-10-CM | POA: Diagnosis not present

## 2016-11-06 DIAGNOSIS — Z992 Dependence on renal dialysis: Secondary | ICD-10-CM | POA: Diagnosis not present

## 2016-11-07 DIAGNOSIS — N186 End stage renal disease: Secondary | ICD-10-CM | POA: Diagnosis not present

## 2016-11-07 DIAGNOSIS — Z992 Dependence on renal dialysis: Secondary | ICD-10-CM | POA: Diagnosis not present

## 2016-11-08 DIAGNOSIS — N186 End stage renal disease: Secondary | ICD-10-CM | POA: Diagnosis not present

## 2016-11-08 DIAGNOSIS — Z992 Dependence on renal dialysis: Secondary | ICD-10-CM | POA: Diagnosis not present

## 2016-11-09 DIAGNOSIS — Z992 Dependence on renal dialysis: Secondary | ICD-10-CM | POA: Diagnosis not present

## 2016-11-09 DIAGNOSIS — N186 End stage renal disease: Secondary | ICD-10-CM | POA: Diagnosis not present

## 2016-11-10 DIAGNOSIS — Z992 Dependence on renal dialysis: Secondary | ICD-10-CM | POA: Diagnosis not present

## 2016-11-10 DIAGNOSIS — N186 End stage renal disease: Secondary | ICD-10-CM | POA: Diagnosis not present

## 2016-11-11 DIAGNOSIS — Z992 Dependence on renal dialysis: Secondary | ICD-10-CM | POA: Diagnosis not present

## 2016-11-11 DIAGNOSIS — N186 End stage renal disease: Secondary | ICD-10-CM | POA: Diagnosis not present

## 2016-11-12 DIAGNOSIS — N186 End stage renal disease: Secondary | ICD-10-CM | POA: Diagnosis not present

## 2016-11-12 DIAGNOSIS — Z992 Dependence on renal dialysis: Secondary | ICD-10-CM | POA: Diagnosis not present

## 2016-11-13 DIAGNOSIS — N186 End stage renal disease: Secondary | ICD-10-CM | POA: Diagnosis not present

## 2016-11-13 DIAGNOSIS — Z992 Dependence on renal dialysis: Secondary | ICD-10-CM | POA: Diagnosis not present

## 2016-11-14 DIAGNOSIS — N186 End stage renal disease: Secondary | ICD-10-CM | POA: Diagnosis not present

## 2016-11-14 DIAGNOSIS — Z992 Dependence on renal dialysis: Secondary | ICD-10-CM | POA: Diagnosis not present

## 2016-11-15 DIAGNOSIS — Z992 Dependence on renal dialysis: Secondary | ICD-10-CM | POA: Diagnosis not present

## 2016-11-15 DIAGNOSIS — N186 End stage renal disease: Secondary | ICD-10-CM | POA: Diagnosis not present

## 2016-11-16 DIAGNOSIS — Z992 Dependence on renal dialysis: Secondary | ICD-10-CM | POA: Diagnosis not present

## 2016-11-16 DIAGNOSIS — N186 End stage renal disease: Secondary | ICD-10-CM | POA: Diagnosis not present

## 2016-11-17 DIAGNOSIS — N186 End stage renal disease: Secondary | ICD-10-CM | POA: Diagnosis not present

## 2016-11-17 DIAGNOSIS — Z992 Dependence on renal dialysis: Secondary | ICD-10-CM | POA: Diagnosis not present

## 2016-11-18 DIAGNOSIS — Z992 Dependence on renal dialysis: Secondary | ICD-10-CM | POA: Diagnosis not present

## 2016-11-18 DIAGNOSIS — N186 End stage renal disease: Secondary | ICD-10-CM | POA: Diagnosis not present

## 2016-11-19 DIAGNOSIS — N186 End stage renal disease: Secondary | ICD-10-CM | POA: Diagnosis not present

## 2016-11-19 DIAGNOSIS — Z992 Dependence on renal dialysis: Secondary | ICD-10-CM | POA: Diagnosis not present

## 2016-11-20 DIAGNOSIS — N186 End stage renal disease: Secondary | ICD-10-CM | POA: Diagnosis not present

## 2016-11-20 DIAGNOSIS — Z992 Dependence on renal dialysis: Secondary | ICD-10-CM | POA: Diagnosis not present

## 2016-11-21 DIAGNOSIS — Z992 Dependence on renal dialysis: Secondary | ICD-10-CM | POA: Diagnosis not present

## 2016-11-21 DIAGNOSIS — N186 End stage renal disease: Secondary | ICD-10-CM | POA: Diagnosis not present

## 2016-11-22 DIAGNOSIS — N186 End stage renal disease: Secondary | ICD-10-CM | POA: Diagnosis not present

## 2016-11-22 DIAGNOSIS — Z992 Dependence on renal dialysis: Secondary | ICD-10-CM | POA: Diagnosis not present

## 2016-11-23 DIAGNOSIS — Z992 Dependence on renal dialysis: Secondary | ICD-10-CM | POA: Diagnosis not present

## 2016-11-23 DIAGNOSIS — N186 End stage renal disease: Secondary | ICD-10-CM | POA: Diagnosis not present

## 2016-11-24 DIAGNOSIS — N186 End stage renal disease: Secondary | ICD-10-CM | POA: Diagnosis not present

## 2016-11-24 DIAGNOSIS — Z992 Dependence on renal dialysis: Secondary | ICD-10-CM | POA: Diagnosis not present

## 2016-11-25 DIAGNOSIS — Z992 Dependence on renal dialysis: Secondary | ICD-10-CM | POA: Diagnosis not present

## 2016-11-25 DIAGNOSIS — N186 End stage renal disease: Secondary | ICD-10-CM | POA: Diagnosis not present

## 2016-11-26 DIAGNOSIS — Z992 Dependence on renal dialysis: Secondary | ICD-10-CM | POA: Diagnosis not present

## 2016-11-26 DIAGNOSIS — N186 End stage renal disease: Secondary | ICD-10-CM | POA: Diagnosis not present

## 2016-11-27 DIAGNOSIS — N186 End stage renal disease: Secondary | ICD-10-CM | POA: Diagnosis not present

## 2016-11-27 DIAGNOSIS — Z992 Dependence on renal dialysis: Secondary | ICD-10-CM | POA: Diagnosis not present

## 2016-11-28 DIAGNOSIS — Z992 Dependence on renal dialysis: Secondary | ICD-10-CM | POA: Diagnosis not present

## 2016-11-28 DIAGNOSIS — N186 End stage renal disease: Secondary | ICD-10-CM | POA: Diagnosis not present

## 2016-11-29 DIAGNOSIS — Z992 Dependence on renal dialysis: Secondary | ICD-10-CM | POA: Diagnosis not present

## 2016-11-29 DIAGNOSIS — N186 End stage renal disease: Secondary | ICD-10-CM | POA: Diagnosis not present

## 2016-11-30 DIAGNOSIS — N186 End stage renal disease: Secondary | ICD-10-CM | POA: Diagnosis not present

## 2016-11-30 DIAGNOSIS — Z992 Dependence on renal dialysis: Secondary | ICD-10-CM | POA: Diagnosis not present

## 2016-12-01 DIAGNOSIS — Z992 Dependence on renal dialysis: Secondary | ICD-10-CM | POA: Diagnosis not present

## 2016-12-01 DIAGNOSIS — N186 End stage renal disease: Secondary | ICD-10-CM | POA: Diagnosis not present

## 2016-12-02 DIAGNOSIS — N186 End stage renal disease: Secondary | ICD-10-CM | POA: Diagnosis not present

## 2016-12-02 DIAGNOSIS — Z992 Dependence on renal dialysis: Secondary | ICD-10-CM | POA: Diagnosis not present

## 2016-12-03 DIAGNOSIS — Z992 Dependence on renal dialysis: Secondary | ICD-10-CM | POA: Diagnosis not present

## 2016-12-03 DIAGNOSIS — N186 End stage renal disease: Secondary | ICD-10-CM | POA: Diagnosis not present

## 2016-12-04 DIAGNOSIS — Z992 Dependence on renal dialysis: Secondary | ICD-10-CM | POA: Diagnosis not present

## 2016-12-04 DIAGNOSIS — N186 End stage renal disease: Secondary | ICD-10-CM | POA: Diagnosis not present

## 2016-12-05 DIAGNOSIS — Z992 Dependence on renal dialysis: Secondary | ICD-10-CM | POA: Diagnosis not present

## 2016-12-05 DIAGNOSIS — N186 End stage renal disease: Secondary | ICD-10-CM | POA: Diagnosis not present

## 2016-12-06 DIAGNOSIS — N186 End stage renal disease: Secondary | ICD-10-CM | POA: Diagnosis not present

## 2016-12-06 DIAGNOSIS — Z992 Dependence on renal dialysis: Secondary | ICD-10-CM | POA: Diagnosis not present

## 2016-12-07 DIAGNOSIS — Z992 Dependence on renal dialysis: Secondary | ICD-10-CM | POA: Diagnosis not present

## 2016-12-07 DIAGNOSIS — N186 End stage renal disease: Secondary | ICD-10-CM | POA: Diagnosis not present

## 2016-12-08 DIAGNOSIS — N186 End stage renal disease: Secondary | ICD-10-CM | POA: Diagnosis not present

## 2016-12-08 DIAGNOSIS — Z992 Dependence on renal dialysis: Secondary | ICD-10-CM | POA: Diagnosis not present

## 2016-12-09 DIAGNOSIS — Z992 Dependence on renal dialysis: Secondary | ICD-10-CM | POA: Diagnosis not present

## 2016-12-09 DIAGNOSIS — N186 End stage renal disease: Secondary | ICD-10-CM | POA: Diagnosis not present

## 2016-12-10 DIAGNOSIS — Z992 Dependence on renal dialysis: Secondary | ICD-10-CM | POA: Diagnosis not present

## 2016-12-10 DIAGNOSIS — N186 End stage renal disease: Secondary | ICD-10-CM | POA: Diagnosis not present

## 2016-12-11 DIAGNOSIS — N186 End stage renal disease: Secondary | ICD-10-CM | POA: Diagnosis not present

## 2016-12-11 DIAGNOSIS — Z992 Dependence on renal dialysis: Secondary | ICD-10-CM | POA: Diagnosis not present

## 2016-12-12 DIAGNOSIS — Z992 Dependence on renal dialysis: Secondary | ICD-10-CM | POA: Diagnosis not present

## 2016-12-12 DIAGNOSIS — N186 End stage renal disease: Secondary | ICD-10-CM | POA: Diagnosis not present

## 2016-12-13 DIAGNOSIS — N186 End stage renal disease: Secondary | ICD-10-CM | POA: Diagnosis not present

## 2016-12-13 DIAGNOSIS — Z992 Dependence on renal dialysis: Secondary | ICD-10-CM | POA: Diagnosis not present

## 2016-12-14 DIAGNOSIS — Z992 Dependence on renal dialysis: Secondary | ICD-10-CM | POA: Diagnosis not present

## 2016-12-14 DIAGNOSIS — N186 End stage renal disease: Secondary | ICD-10-CM | POA: Diagnosis not present

## 2016-12-15 DIAGNOSIS — N186 End stage renal disease: Secondary | ICD-10-CM | POA: Diagnosis not present

## 2016-12-15 DIAGNOSIS — Z992 Dependence on renal dialysis: Secondary | ICD-10-CM | POA: Diagnosis not present

## 2016-12-16 DIAGNOSIS — N186 End stage renal disease: Secondary | ICD-10-CM | POA: Diagnosis not present

## 2016-12-16 DIAGNOSIS — Z992 Dependence on renal dialysis: Secondary | ICD-10-CM | POA: Diagnosis not present

## 2016-12-17 DIAGNOSIS — Z992 Dependence on renal dialysis: Secondary | ICD-10-CM | POA: Diagnosis not present

## 2016-12-17 DIAGNOSIS — N186 End stage renal disease: Secondary | ICD-10-CM | POA: Diagnosis not present

## 2016-12-18 DIAGNOSIS — Z992 Dependence on renal dialysis: Secondary | ICD-10-CM | POA: Diagnosis not present

## 2016-12-18 DIAGNOSIS — N186 End stage renal disease: Secondary | ICD-10-CM | POA: Diagnosis not present

## 2016-12-19 DIAGNOSIS — Z992 Dependence on renal dialysis: Secondary | ICD-10-CM | POA: Diagnosis not present

## 2016-12-19 DIAGNOSIS — N186 End stage renal disease: Secondary | ICD-10-CM | POA: Diagnosis not present

## 2016-12-20 DIAGNOSIS — N186 End stage renal disease: Secondary | ICD-10-CM | POA: Diagnosis not present

## 2016-12-20 DIAGNOSIS — Z992 Dependence on renal dialysis: Secondary | ICD-10-CM | POA: Diagnosis not present

## 2016-12-21 DIAGNOSIS — N186 End stage renal disease: Secondary | ICD-10-CM | POA: Diagnosis not present

## 2016-12-21 DIAGNOSIS — Z992 Dependence on renal dialysis: Secondary | ICD-10-CM | POA: Diagnosis not present

## 2016-12-22 DIAGNOSIS — Z992 Dependence on renal dialysis: Secondary | ICD-10-CM | POA: Diagnosis not present

## 2016-12-22 DIAGNOSIS — N186 End stage renal disease: Secondary | ICD-10-CM | POA: Diagnosis not present

## 2016-12-23 DIAGNOSIS — Z992 Dependence on renal dialysis: Secondary | ICD-10-CM | POA: Diagnosis not present

## 2016-12-23 DIAGNOSIS — N186 End stage renal disease: Secondary | ICD-10-CM | POA: Diagnosis not present

## 2016-12-24 DIAGNOSIS — N186 End stage renal disease: Secondary | ICD-10-CM | POA: Diagnosis not present

## 2016-12-24 DIAGNOSIS — Z992 Dependence on renal dialysis: Secondary | ICD-10-CM | POA: Diagnosis not present

## 2016-12-25 DIAGNOSIS — Z992 Dependence on renal dialysis: Secondary | ICD-10-CM | POA: Diagnosis not present

## 2016-12-25 DIAGNOSIS — N186 End stage renal disease: Secondary | ICD-10-CM | POA: Diagnosis not present

## 2016-12-26 DIAGNOSIS — H04123 Dry eye syndrome of bilateral lacrimal glands: Secondary | ICD-10-CM | POA: Diagnosis not present

## 2016-12-26 DIAGNOSIS — Z79899 Other long term (current) drug therapy: Secondary | ICD-10-CM | POA: Diagnosis not present

## 2016-12-26 DIAGNOSIS — Z992 Dependence on renal dialysis: Secondary | ICD-10-CM | POA: Diagnosis not present

## 2016-12-26 DIAGNOSIS — H3581 Retinal edema: Secondary | ICD-10-CM | POA: Diagnosis not present

## 2016-12-26 DIAGNOSIS — H2513 Age-related nuclear cataract, bilateral: Secondary | ICD-10-CM | POA: Diagnosis not present

## 2016-12-26 DIAGNOSIS — N186 End stage renal disease: Secondary | ICD-10-CM | POA: Diagnosis not present

## 2016-12-26 DIAGNOSIS — H35713 Central serous chorioretinopathy, bilateral: Secondary | ICD-10-CM | POA: Diagnosis not present

## 2016-12-26 DIAGNOSIS — H353231 Exudative age-related macular degeneration, bilateral, with active choroidal neovascularization: Secondary | ICD-10-CM | POA: Diagnosis not present

## 2016-12-27 DIAGNOSIS — N186 End stage renal disease: Secondary | ICD-10-CM | POA: Diagnosis not present

## 2016-12-27 DIAGNOSIS — Z992 Dependence on renal dialysis: Secondary | ICD-10-CM | POA: Diagnosis not present

## 2016-12-28 DIAGNOSIS — Z992 Dependence on renal dialysis: Secondary | ICD-10-CM | POA: Diagnosis not present

## 2016-12-28 DIAGNOSIS — E7800 Pure hypercholesterolemia, unspecified: Secondary | ICD-10-CM | POA: Diagnosis not present

## 2016-12-28 DIAGNOSIS — Z7682 Awaiting organ transplant status: Secondary | ICD-10-CM | POA: Diagnosis not present

## 2016-12-28 DIAGNOSIS — N186 End stage renal disease: Secondary | ICD-10-CM | POA: Diagnosis not present

## 2016-12-29 DIAGNOSIS — N186 End stage renal disease: Secondary | ICD-10-CM | POA: Diagnosis not present

## 2016-12-29 DIAGNOSIS — Z992 Dependence on renal dialysis: Secondary | ICD-10-CM | POA: Diagnosis not present

## 2016-12-30 DIAGNOSIS — N186 End stage renal disease: Secondary | ICD-10-CM | POA: Diagnosis not present

## 2016-12-30 DIAGNOSIS — Z992 Dependence on renal dialysis: Secondary | ICD-10-CM | POA: Diagnosis not present

## 2016-12-31 DIAGNOSIS — N186 End stage renal disease: Secondary | ICD-10-CM | POA: Diagnosis not present

## 2016-12-31 DIAGNOSIS — Z992 Dependence on renal dialysis: Secondary | ICD-10-CM | POA: Diagnosis not present

## 2017-01-01 DIAGNOSIS — N186 End stage renal disease: Secondary | ICD-10-CM | POA: Diagnosis not present

## 2017-01-01 DIAGNOSIS — Z992 Dependence on renal dialysis: Secondary | ICD-10-CM | POA: Diagnosis not present

## 2017-01-02 DIAGNOSIS — Z992 Dependence on renal dialysis: Secondary | ICD-10-CM | POA: Diagnosis not present

## 2017-01-02 DIAGNOSIS — N186 End stage renal disease: Secondary | ICD-10-CM | POA: Diagnosis not present

## 2017-01-03 DIAGNOSIS — Z992 Dependence on renal dialysis: Secondary | ICD-10-CM | POA: Diagnosis not present

## 2017-01-03 DIAGNOSIS — N186 End stage renal disease: Secondary | ICD-10-CM | POA: Diagnosis not present

## 2017-01-04 DIAGNOSIS — N186 End stage renal disease: Secondary | ICD-10-CM | POA: Diagnosis not present

## 2017-01-04 DIAGNOSIS — Z992 Dependence on renal dialysis: Secondary | ICD-10-CM | POA: Diagnosis not present

## 2017-01-05 DIAGNOSIS — N186 End stage renal disease: Secondary | ICD-10-CM | POA: Diagnosis not present

## 2017-01-05 DIAGNOSIS — Z992 Dependence on renal dialysis: Secondary | ICD-10-CM | POA: Diagnosis not present

## 2017-01-06 DIAGNOSIS — N186 End stage renal disease: Secondary | ICD-10-CM | POA: Diagnosis not present

## 2017-01-06 DIAGNOSIS — Z992 Dependence on renal dialysis: Secondary | ICD-10-CM | POA: Diagnosis not present

## 2017-01-07 DIAGNOSIS — Z992 Dependence on renal dialysis: Secondary | ICD-10-CM | POA: Diagnosis not present

## 2017-01-07 DIAGNOSIS — N186 End stage renal disease: Secondary | ICD-10-CM | POA: Diagnosis not present

## 2017-01-08 DIAGNOSIS — N186 End stage renal disease: Secondary | ICD-10-CM | POA: Diagnosis not present

## 2017-01-08 DIAGNOSIS — Z992 Dependence on renal dialysis: Secondary | ICD-10-CM | POA: Diagnosis not present

## 2017-01-09 DIAGNOSIS — N186 End stage renal disease: Secondary | ICD-10-CM | POA: Diagnosis not present

## 2017-01-09 DIAGNOSIS — Z992 Dependence on renal dialysis: Secondary | ICD-10-CM | POA: Diagnosis not present

## 2017-01-10 DIAGNOSIS — N186 End stage renal disease: Secondary | ICD-10-CM | POA: Diagnosis not present

## 2017-01-10 DIAGNOSIS — Z992 Dependence on renal dialysis: Secondary | ICD-10-CM | POA: Diagnosis not present

## 2017-01-11 DIAGNOSIS — N186 End stage renal disease: Secondary | ICD-10-CM | POA: Diagnosis not present

## 2017-01-11 DIAGNOSIS — Z992 Dependence on renal dialysis: Secondary | ICD-10-CM | POA: Diagnosis not present

## 2017-01-12 DIAGNOSIS — N186 End stage renal disease: Secondary | ICD-10-CM | POA: Diagnosis not present

## 2017-01-12 DIAGNOSIS — Z992 Dependence on renal dialysis: Secondary | ICD-10-CM | POA: Diagnosis not present

## 2017-01-13 DIAGNOSIS — Z992 Dependence on renal dialysis: Secondary | ICD-10-CM | POA: Diagnosis not present

## 2017-01-13 DIAGNOSIS — N186 End stage renal disease: Secondary | ICD-10-CM | POA: Diagnosis not present

## 2017-01-14 DIAGNOSIS — Z992 Dependence on renal dialysis: Secondary | ICD-10-CM | POA: Diagnosis not present

## 2017-01-14 DIAGNOSIS — N186 End stage renal disease: Secondary | ICD-10-CM | POA: Diagnosis not present

## 2017-01-15 DIAGNOSIS — N186 End stage renal disease: Secondary | ICD-10-CM | POA: Diagnosis not present

## 2017-01-15 DIAGNOSIS — Z992 Dependence on renal dialysis: Secondary | ICD-10-CM | POA: Diagnosis not present

## 2017-01-16 DIAGNOSIS — Z992 Dependence on renal dialysis: Secondary | ICD-10-CM | POA: Diagnosis not present

## 2017-01-16 DIAGNOSIS — N186 End stage renal disease: Secondary | ICD-10-CM | POA: Diagnosis not present

## 2017-01-17 DIAGNOSIS — N186 End stage renal disease: Secondary | ICD-10-CM | POA: Diagnosis not present

## 2017-01-17 DIAGNOSIS — Z992 Dependence on renal dialysis: Secondary | ICD-10-CM | POA: Diagnosis not present

## 2017-01-18 DIAGNOSIS — N186 End stage renal disease: Secondary | ICD-10-CM | POA: Diagnosis not present

## 2017-01-18 DIAGNOSIS — Z992 Dependence on renal dialysis: Secondary | ICD-10-CM | POA: Diagnosis not present

## 2017-01-19 DIAGNOSIS — N186 End stage renal disease: Secondary | ICD-10-CM | POA: Diagnosis not present

## 2017-01-19 DIAGNOSIS — Z992 Dependence on renal dialysis: Secondary | ICD-10-CM | POA: Diagnosis not present

## 2017-01-20 DIAGNOSIS — Z992 Dependence on renal dialysis: Secondary | ICD-10-CM | POA: Diagnosis not present

## 2017-01-20 DIAGNOSIS — N186 End stage renal disease: Secondary | ICD-10-CM | POA: Diagnosis not present

## 2017-01-21 DIAGNOSIS — N186 End stage renal disease: Secondary | ICD-10-CM | POA: Diagnosis not present

## 2017-01-21 DIAGNOSIS — Z992 Dependence on renal dialysis: Secondary | ICD-10-CM | POA: Diagnosis not present

## 2017-01-22 DIAGNOSIS — N186 End stage renal disease: Secondary | ICD-10-CM | POA: Diagnosis not present

## 2017-01-22 DIAGNOSIS — Z992 Dependence on renal dialysis: Secondary | ICD-10-CM | POA: Diagnosis not present

## 2017-01-23 DIAGNOSIS — N186 End stage renal disease: Secondary | ICD-10-CM | POA: Diagnosis not present

## 2017-01-23 DIAGNOSIS — Z992 Dependence on renal dialysis: Secondary | ICD-10-CM | POA: Diagnosis not present

## 2017-01-24 DIAGNOSIS — Z992 Dependence on renal dialysis: Secondary | ICD-10-CM | POA: Diagnosis not present

## 2017-01-24 DIAGNOSIS — N186 End stage renal disease: Secondary | ICD-10-CM | POA: Diagnosis not present

## 2017-01-25 DIAGNOSIS — Z992 Dependence on renal dialysis: Secondary | ICD-10-CM | POA: Diagnosis not present

## 2017-01-25 DIAGNOSIS — N186 End stage renal disease: Secondary | ICD-10-CM | POA: Diagnosis not present

## 2017-01-26 DIAGNOSIS — Z992 Dependence on renal dialysis: Secondary | ICD-10-CM | POA: Diagnosis not present

## 2017-01-26 DIAGNOSIS — N186 End stage renal disease: Secondary | ICD-10-CM | POA: Diagnosis not present

## 2017-01-27 DIAGNOSIS — N186 End stage renal disease: Secondary | ICD-10-CM | POA: Diagnosis not present

## 2017-01-27 DIAGNOSIS — Z992 Dependence on renal dialysis: Secondary | ICD-10-CM | POA: Diagnosis not present

## 2017-01-28 DIAGNOSIS — Z992 Dependence on renal dialysis: Secondary | ICD-10-CM | POA: Diagnosis not present

## 2017-01-28 DIAGNOSIS — N186 End stage renal disease: Secondary | ICD-10-CM | POA: Diagnosis not present

## 2017-01-29 ENCOUNTER — Encounter: Payer: Self-pay | Admitting: Medical Oncology

## 2017-01-29 ENCOUNTER — Inpatient Hospital Stay
Admission: EM | Admit: 2017-01-29 | Discharge: 2017-01-31 | DRG: 871 | Disposition: A | Discharge status: Home / Self Care | Payer: 59 | Attending: Internal Medicine | Admitting: Internal Medicine

## 2017-01-29 ENCOUNTER — Telehealth: Payer: Self-pay | Admitting: *Deleted

## 2017-01-29 ENCOUNTER — Emergency Department: Payer: 59

## 2017-01-29 ENCOUNTER — Ambulatory Visit: Payer: 59 | Admitting: Family Medicine

## 2017-01-29 DIAGNOSIS — Z87891 Personal history of nicotine dependence: Secondary | ICD-10-CM

## 2017-01-29 DIAGNOSIS — J189 Pneumonia, unspecified organism: Secondary | ICD-10-CM | POA: Diagnosis not present

## 2017-01-29 DIAGNOSIS — K219 Gastro-esophageal reflux disease without esophagitis: Secondary | ICD-10-CM | POA: Diagnosis present

## 2017-01-29 DIAGNOSIS — R6510 Systemic inflammatory response syndrome (SIRS) of non-infectious origin without acute organ dysfunction: Secondary | ICD-10-CM

## 2017-01-29 DIAGNOSIS — I120 Hypertensive chronic kidney disease with stage 5 chronic kidney disease or end stage renal disease: Secondary | ICD-10-CM | POA: Diagnosis present

## 2017-01-29 DIAGNOSIS — A419 Sepsis, unspecified organism: Principal | ICD-10-CM

## 2017-01-29 DIAGNOSIS — Z888 Allergy status to other drugs, medicaments and biological substances status: Secondary | ICD-10-CM | POA: Diagnosis not present

## 2017-01-29 DIAGNOSIS — J159 Unspecified bacterial pneumonia: Secondary | ICD-10-CM | POA: Diagnosis present

## 2017-01-29 DIAGNOSIS — Z79899 Other long term (current) drug therapy: Secondary | ICD-10-CM | POA: Diagnosis not present

## 2017-01-29 DIAGNOSIS — E785 Hyperlipidemia, unspecified: Secondary | ICD-10-CM | POA: Diagnosis present

## 2017-01-29 DIAGNOSIS — N2581 Secondary hyperparathyroidism of renal origin: Secondary | ICD-10-CM | POA: Diagnosis present

## 2017-01-29 DIAGNOSIS — N186 End stage renal disease: Secondary | ICD-10-CM | POA: Diagnosis present

## 2017-01-29 DIAGNOSIS — J988 Other specified respiratory disorders: Secondary | ICD-10-CM

## 2017-01-29 DIAGNOSIS — R739 Hyperglycemia, unspecified: Secondary | ICD-10-CM | POA: Diagnosis present

## 2017-01-29 DIAGNOSIS — J181 Lobar pneumonia, unspecified organism: Secondary | ICD-10-CM | POA: Diagnosis not present

## 2017-01-29 DIAGNOSIS — Z8249 Family history of ischemic heart disease and other diseases of the circulatory system: Secondary | ICD-10-CM

## 2017-01-29 DIAGNOSIS — E875 Hyperkalemia: Secondary | ICD-10-CM | POA: Diagnosis present

## 2017-01-29 DIAGNOSIS — R918 Other nonspecific abnormal finding of lung field: Secondary | ICD-10-CM | POA: Diagnosis not present

## 2017-01-29 DIAGNOSIS — Z992 Dependence on renal dialysis: Secondary | ICD-10-CM

## 2017-01-29 DIAGNOSIS — R05 Cough: Secondary | ICD-10-CM | POA: Diagnosis not present

## 2017-01-29 DIAGNOSIS — D631 Anemia in chronic kidney disease: Secondary | ICD-10-CM | POA: Diagnosis present

## 2017-01-29 DIAGNOSIS — B9689 Other specified bacterial agents as the cause of diseases classified elsewhere: Secondary | ICD-10-CM

## 2017-01-29 LAB — TROPONIN I
TROPONIN I: 0.03 ng/mL — AB (ref <0.03)
TROPONIN I: 0.04 ng/mL — AB (ref <0.03)

## 2017-01-29 LAB — CBC WITH DIFFERENTIAL/PLATELET
BASOS ABS: 0.1 10*3/uL (ref 0–0.1)
Basophils Relative: 1 %
EOS ABS: 0.2 10*3/uL (ref 0–0.7)
EOS PCT: 2 %
HCT: 31.3 % — ABNORMAL LOW (ref 40.0–52.0)
Hemoglobin: 10.3 g/dL — ABNORMAL LOW (ref 13.0–18.0)
Lymphocytes Relative: 12 %
Lymphs Abs: 1.2 10*3/uL (ref 1.0–3.6)
MCH: 31.1 pg (ref 26.0–34.0)
MCHC: 33 g/dL (ref 32.0–36.0)
MCV: 94.4 fL (ref 80.0–100.0)
MONO ABS: 0.6 10*3/uL (ref 0.2–1.0)
Monocytes Relative: 6 %
Neutro Abs: 7.9 10*3/uL — ABNORMAL HIGH (ref 1.4–6.5)
Neutrophils Relative %: 79 %
PLATELETS: 189 10*3/uL (ref 150–440)
RBC: 3.32 MIL/uL — AB (ref 4.40–5.90)
RDW: 18.5 % — AB (ref 11.5–14.5)
WBC: 10.1 10*3/uL (ref 3.8–10.6)

## 2017-01-29 LAB — COMPREHENSIVE METABOLIC PANEL
ALK PHOS: 167 U/L — AB (ref 38–126)
ALT: 55 U/L (ref 17–63)
AST: 33 U/L (ref 15–41)
Albumin: 3.5 g/dL (ref 3.5–5.0)
Anion gap: 11 (ref 5–15)
BILIRUBIN TOTAL: 0.7 mg/dL (ref 0.3–1.2)
BUN: 56 mg/dL — AB (ref 6–20)
CALCIUM: 8.8 mg/dL — AB (ref 8.9–10.3)
CO2: 24 mmol/L (ref 22–32)
CREATININE: 5.49 mg/dL — AB (ref 0.61–1.24)
Chloride: 105 mmol/L (ref 101–111)
GFR, EST AFRICAN AMERICAN: 12 mL/min — AB (ref >60)
GFR, EST NON AFRICAN AMERICAN: 11 mL/min — AB (ref >60)
Glucose, Bld: 126 mg/dL — ABNORMAL HIGH (ref 65–99)
Potassium: 5.1 mmol/L (ref 3.5–5.1)
Sodium: 140 mmol/L (ref 135–145)
TOTAL PROTEIN: 6.8 g/dL (ref 6.5–8.1)

## 2017-01-29 LAB — EXPECTORATED SPUTUM ASSESSMENT W GRAM STAIN, RFLX TO RESP C

## 2017-01-29 LAB — MRSA PCR SCREENING: MRSA by PCR: NEGATIVE

## 2017-01-29 LAB — LACTIC ACID, PLASMA: LACTIC ACID, VENOUS: 1.3 mmol/L (ref 0.5–1.9)

## 2017-01-29 MED ORDER — ALBUTEROL SULFATE (2.5 MG/3ML) 0.083% IN NEBU: 2.5000 mg | INHALATION_SOLUTION | Freq: Four times a day (QID) | RESPIRATORY_TRACT | Status: DC | PRN

## 2017-01-29 MED ORDER — PIPERACILLIN-TAZOBACTAM 3.375 G IVPB 30 MIN
INTRAVENOUS | Status: AC
  Filled 2017-01-29: qty 50

## 2017-01-29 MED ORDER — PATIROMER SORBITEX CALCIUM 8.4 G PO PACK
1.0000 | PACK | Freq: Every day | ORAL | Status: DC
  Administered 2017-01-29 – 2017-01-30 (×2): 1 via ORAL
  Filled 2017-01-29 (×4): qty 4

## 2017-01-29 MED ORDER — LOSARTAN POTASSIUM 50 MG PO TABS
50.0000 mg | ORAL_TABLET | Freq: Every day | ORAL | Status: DC
  Administered 2017-01-29 – 2017-01-31 (×3): 50 mg via ORAL
  Filled 2017-01-29 (×3): qty 1

## 2017-01-29 MED ORDER — ACETAMINOPHEN 650 MG RE SUPP: 650.0000 mg | Freq: Four times a day (QID) | RECTAL | Status: DC | PRN

## 2017-01-29 MED ORDER — SODIUM CHLORIDE 0.9% FLUSH
3.0000 mL | Freq: Two times a day (BID) | INTRAVENOUS | Status: DC
  Administered 2017-01-29 – 2017-01-30 (×3): 3 mL via INTRAVENOUS

## 2017-01-29 MED ORDER — VANCOMYCIN HCL IN DEXTROSE 1-5 GM/200ML-% IV SOLN
1000.0000 mg | Freq: Once | INTRAVENOUS | Status: AC
  Administered 2017-01-29: 1000 mg via INTRAVENOUS
  Filled 2017-01-29: qty 200

## 2017-01-29 MED ORDER — FOLIC ACID 1 MG PO TABS
5.0000 mg | ORAL_TABLET | Freq: Every day | ORAL | Status: DC
  Administered 2017-01-29 – 2017-01-31 (×3): 5 mg via ORAL
  Filled 2017-01-29 (×3): qty 5

## 2017-01-29 MED ORDER — ALBUTEROL SULFATE (2.5 MG/3ML) 0.083% IN NEBU
2.5000 mg | INHALATION_SOLUTION | Freq: Four times a day (QID) | RESPIRATORY_TRACT | Status: DC
  Administered 2017-01-29: 2.5 mg via RESPIRATORY_TRACT
  Filled 2017-01-29: qty 3

## 2017-01-29 MED ORDER — DEXTROSE 5 % IV SOLN
2.0000 g | INTRAVENOUS | Status: DC
  Administered 2017-01-29: 2 g via INTRAVENOUS
  Filled 2017-01-29: qty 2

## 2017-01-29 MED ORDER — SODIUM CHLORIDE 0.9 % IV SOLN: 250.0000 mL | INTRAVENOUS | Status: DC | PRN

## 2017-01-29 MED ORDER — HEPARIN 1000 UNIT/ML FOR PERITONEAL DIALYSIS
500.0000 [IU] | INTRAMUSCULAR | Status: DC | PRN
  Filled 2017-01-29: qty 0.5

## 2017-01-29 MED ORDER — GENTAMICIN SULFATE 0.1 % EX CREA
1.0000 "application " | TOPICAL_CREAM | Freq: Every day | CUTANEOUS | Status: DC
  Administered 2017-01-29 – 2017-01-30 (×2): 1 via TOPICAL
  Filled 2017-01-29: qty 15

## 2017-01-29 MED ORDER — ACETAMINOPHEN 325 MG PO TABS
650.0000 mg | ORAL_TABLET | Freq: Four times a day (QID) | ORAL | Status: DC | PRN
  Administered 2017-01-29 – 2017-01-30 (×2): 650 mg via ORAL
  Filled 2017-01-29 (×2): qty 2

## 2017-01-29 MED ORDER — PANTOPRAZOLE SODIUM 40 MG PO TBEC
40.0000 mg | DELAYED_RELEASE_TABLET | Freq: Every day | ORAL | Status: DC
  Administered 2017-01-29 – 2017-01-31 (×3): 40 mg via ORAL
  Filled 2017-01-29 (×3): qty 1

## 2017-01-29 MED ORDER — SODIUM CHLORIDE 0.9% FLUSH
3.0000 mL | INTRAVENOUS | Status: DC | PRN
  Administered 2017-01-30: 3 mL via INTRAVENOUS
  Filled 2017-01-29: qty 3

## 2017-01-29 MED ORDER — GENTAMICIN SULFATE 0.1 % EX OINT
TOPICAL_OINTMENT | Freq: Every day | CUTANEOUS | Status: DC
  Administered 2017-01-29 – 2017-01-30 (×2): via TOPICAL
  Filled 2017-01-29: qty 15

## 2017-01-29 MED ORDER — ONDANSETRON HCL 4 MG PO TABS
4.0000 mg | ORAL_TABLET | Freq: Four times a day (QID) | ORAL | Status: DC | PRN
  Administered 2017-01-30: 4 mg via ORAL
  Filled 2017-01-29: qty 1

## 2017-01-29 MED ORDER — ACETAMINOPHEN 500 MG PO TABS
1000.0000 mg | ORAL_TABLET | Freq: Once | ORAL | Status: AC
  Administered 2017-01-29: 1000 mg via ORAL
  Filled 2017-01-29: qty 2

## 2017-01-29 MED ORDER — POLYVINYL ALCOHOL 1.4 % OP SOLN
1.0000 [drp] | OPHTHALMIC | Status: DC | PRN
  Filled 2017-01-29: qty 15

## 2017-01-29 MED ORDER — ATORVASTATIN CALCIUM 20 MG PO TABS
20.0000 mg | ORAL_TABLET | Freq: Every day | ORAL | Status: DC
  Administered 2017-01-29 – 2017-01-31 (×3): 20 mg via ORAL
  Filled 2017-01-29 (×3): qty 1

## 2017-01-29 MED ORDER — PIPERACILLIN-TAZOBACTAM 3.375 G IVPB 30 MIN
3.3750 g | Freq: Once | INTRAVENOUS | Status: AC
  Administered 2017-01-29: 3.375 g via INTRAVENOUS

## 2017-01-29 MED ORDER — SOOTHE XP OP SOLN: 1.0000 [drp] | OPHTHALMIC | Status: DC | PRN

## 2017-01-29 MED ORDER — CYCLOSPORINE 0.05 % OP EMUL
1.0000 [drp] | Freq: Every day | OPHTHALMIC | Status: DC
  Administered 2017-01-30 – 2017-01-31 (×2): 1 [drp] via OPHTHALMIC
  Filled 2017-01-29 (×2): qty 1

## 2017-01-29 MED ORDER — GENTAMICIN SULFATE 0.1 % EX CREA
1.0000 "application " | TOPICAL_CREAM | Freq: Every day | CUTANEOUS | Status: DC
  Filled 2017-01-29: qty 15

## 2017-01-29 MED ORDER — ONDANSETRON HCL 4 MG/2ML IJ SOLN: 4.0000 mg | Freq: Four times a day (QID) | INTRAMUSCULAR | Status: DC | PRN

## 2017-01-29 MED ORDER — METOPROLOL TARTRATE 25 MG PO TABS
25.0000 mg | ORAL_TABLET | Freq: Two times a day (BID) | ORAL | Status: DC
  Administered 2017-01-29 – 2017-01-30 (×3): 25 mg via ORAL
  Filled 2017-01-29 (×5): qty 1

## 2017-01-29 MED ORDER — ASPIRIN EC 81 MG PO TBEC
81.0000 mg | DELAYED_RELEASE_TABLET | Freq: Every day | ORAL | Status: DC
  Administered 2017-01-29 – 2017-01-31 (×3): 81 mg via ORAL
  Filled 2017-01-29 (×3): qty 1

## 2017-01-29 MED ORDER — SODIUM CHLORIDE 0.9 % IV SOLN
Freq: Once | INTRAVENOUS | Status: AC
  Administered 2017-01-29: 1000 mL via INTRAVENOUS

## 2017-01-29 MED ORDER — HEPARIN SODIUM (PORCINE) 5000 UNIT/ML IJ SOLN
5000.0000 [IU] | Freq: Three times a day (TID) | INTRAMUSCULAR | Status: DC
  Administered 2017-01-29 – 2017-01-30 (×3): 5000 [IU] via SUBCUTANEOUS
  Filled 2017-01-29 (×4): qty 1

## 2017-01-29 MED ORDER — METHOTREXATE 2.5 MG PO TABS
10.0000 mg | ORAL_TABLET | ORAL | Status: DC
  Administered 2017-01-29: 10 mg via ORAL
  Filled 2017-01-29 (×2): qty 4

## 2017-01-29 NOTE — Progress Notes (Signed)
Pharmacy Antibiotic Note  Joseph [name] is a 51 y.o. male admitted on 01/29/2017 with Sepsis secondary to Right lower lobe  PNA.   Pharmacy has been consulted for cefepime and vancomycin dosing. Patient received Zosyn 3.375 IV and Vancomycin 1g IV x 1 dose in ED.   Patient does have ESRD and is on peritoneal dialysis.   Plan: Will initiate Cefepime 2g IV every 48 hours (PD Dosing)   Vanc loading dose should be 30mg /L X 0.9 x Wt (81.6kg).   Estimated loading dose is 2100mg . Will order additional Vancomycin 1000mg  ( total LD: 2000mg ).  Plan to draw level in 3-4 days, and redose once vanc level 20mg /L.   MRSA PCR ordered- if negative, recommend discontinuation of vancomycin.   Height: 6' (182.9 cm) Weight: 180 lb (81.6 kg) IBW/kg (Calculated) : 77.6  Temp (24hrs), Avg:100.7 F (38.2 C), Min:99.1 F (37.3 C), Max:102.9 F (39.4 C)   Recent Labs Lab 01/29/17 1031  WBC 10.1  CREATININE 5.49*  LATICACIDVEN 1.3    Estimated Creatinine Clearance: 17.1 mL/min (A) (by C-G formula based on SCr of 5.49 mg/dL (H)).    Allergies  Allergen Reactions  . Ciprofloxacin Hcl Swelling    High fever  . Doxycycline Hyclate Swelling  . Phenol-Glycerin Swelling    Antimicrobials this admission: 8/6 vancomycin >>  8/6 cefepime >>   Dose adjustments this admission:  Microbiology results: 8/6 BCx: pending  8/6 UCx: pending 8/6 sputum: sent   8/6  MRSA PCR: ordered  Thank you for allowing pharmacy to be a part of this patient's care.  Pernell Dupre, PharmD, BCPS Clinical Pharmacist 01/29/2017 3:00 PM

## 2017-01-29 NOTE — ED Triage Notes (Signed)
Pt reports for the past 2 days he has had cough and fever. Pt reports fatigue but denies sob. Pt states that he also has a headache.

## 2017-01-29 NOTE — ED Notes (Signed)
First nurse note: Pt reports fever up to 104 last night. Pt reports is home PD pt and the bag has not been draining correctly for two days. Pt also reports cough.

## 2017-01-29 NOTE — Telephone Encounter (Signed)
Agree with above, needs ER evaluation based on history does home HD

## 2017-01-29 NOTE — Progress Notes (Signed)
Pre [facility]CPD

## 2017-01-29 NOTE — Progress Notes (Signed)
[facility] initiated without issue at bedside in patient room. Temp 101.9. Primary RN informed. Exit site care performed as well.

## 2017-01-29 NOTE — ED Notes (Signed)
Pt to ed with c/o fever that started yesterday.  Pt also reports decreased output from PD catheter over the last 3 nights.  Pt skin hot to touch.  Pt also reports cough, congestion, sob and chest soreness, worse with cough and movement.  Upon arrival to ed pt placed on CM, EKG done, IV x 2 started and labs drawn and sent.  Pt alert and oriented, although wife states pt was confused last night when fever was 104.  Last tylenol for fever was at 4 am, 1000mg  per family.  No swelling or redness noted to pt abd near PD site.  Lungs with faint crackles bilat, pt with mildly rapid respirations at 24/min.  Pt skin color wdl for age and race.  Pt ST on CM, rate 110.

## 2017-01-29 NOTE — H&P (Signed)
[facility] NAME: Joseph [name]    MR#:  [id number]  DATE OF BIRTH:  17-Mar-1966  DATE OF ADMISSION:  01/29/2017  PRIMARY CARE PHYSICIAN: Alycia Rossetti, MD   REQUESTING/REFERRING PHYSICIAN:   CHIEF COMPLAINT:   Chief Complaint  Patient presents with  . Cough  . Fever    HISTORY OF PRESENT ILLNESS: Joseph [name]  is a 51 y.o. male with a known history of Amyloidosis, end-stage renal disease on peritoneal dialysis, dry eyes, hypertension, hyperlipidemia, who presents to the hospital with complaints of high fevers which started about 2 days ago, cough, light green phlegm, wheezing, shortness of breath, headaches. On arrival to the hospital patient was febrile with temperature of 102.9, tachycardic with heart rate around 107, oxygen saturations remained stable at 98, 100% on room air. Chest x-ray revealed developing right lower lobe infiltrate. Patient complains of cough related chest pains, abdominal pains, nausea. Peritoneal fluid is not yet taken for testing, pending. Hospitalist services were contacted for admission  PAST MEDICAL HISTORY:   Past Medical History:  Diagnosis Date  . Blood clot in abdominal vein    happened around age 32 due to traumatic injury  . Blood dyscrasia    amyloidosis  . Central serous chorioretinopathy   . Dry eye   . GERD (gastroesophageal reflux disease)   . Heart murmur    never followed up with this, no problems  . Hyperlipidemia   . Hypertension    improved since starting dialysis  . Pneumonia    as child  . Renal insufficiency     PAST SURGICAL HISTORY: Past Surgical History:  Procedure Laterality Date  . AORTOGRAM Bilateral 04/11/2015   Procedure: Aortogram;  Surgeon: Katha Cabal, MD;  Location: [facility];  Service: Cardiovascular;  Laterality: Bilateral;  . CAPD INSERTION N/A 07/09/2015   Procedure: LAPAROSCOPIC INSERTION CONTINUOUS AMBULATORY PERITONEAL  DIALYSIS  (CAPD) CATHETER;  Surgeon: Katha Cabal, MD;  Location: [facility];  Service: Vascular;  Laterality: N/A;  . COLONOSCOPY WITH PROPOFOL N/A 01/11/2016   Procedure: COLONOSCOPY WITH PROPOFOL;  Surgeon: [name], MD;  Location: [facility];  Service: Endoscopy;  Laterality: N/A;  . EXPLORATORY LAPAROTOMY     done to find and remove abdominal blood clot as a teenager  . IMPLANTATION / PLACEMENT PERMANENT EPIDURAL CATHETER Right 2016  . PERIPHERAL VASCULAR CATHETERIZATION Left 04/11/2015   Procedure: Embolization;  Surgeon: Katha Cabal, MD;  Location: [place] [facility];  Service: Cardiovascular;  Laterality: Left;  . PERIPHERAL VASCULAR CATHETERIZATION N/A 04/16/2015   Procedure: Dialysis/Perma Catheter Insertion;  Surgeon: Katha Cabal, MD;  Location: [place] [facility];  Service: Cardiovascular;  Laterality: N/A;  . PERIPHERAL VASCULAR CATHETERIZATION N/A 08/24/2015   Procedure: Dialysis/Perma Catheter Removal;  Surgeon: Katha Cabal, MD;  Location: [facility];  Service: Cardiovascular;  Laterality: N/A;  . RENAL BIOPSY, PERCUTANEOUS  04/06/2015      . TONSILLECTOMY      SOCIAL HISTORY:  Social History  Substance Use Topics  . Smoking status: Former Smoker    Packs/day: 0.50    Years: 20.00    Types: Cigarettes    Quit date: 11/04/2006  . Smokeless tobacco: Never Used  . Alcohol use No    FAMILY HISTORY:  Family History  Problem Relation Age of Onset  . Hypertension Mother   . Birth defects Sister   . Multiple sclerosis Sister   .  Heart disease Maternal Uncle   . Early death Maternal Uncle   . Heart attack Maternal Uncle   . Hypertension Other   . Heart attack Maternal Uncle   . Heart disease Maternal Uncle     DRUG ALLERGIES:  Allergies  Allergen Reactions  . Ciprofloxacin Hcl Swelling    High fever  . Doxycycline Hyclate Swelling  . Phenol-Glycerin Swelling    Review of Systems  Constitutional: Positive for chills,  diaphoresis, fever and malaise/fatigue. Negative for weight loss.  HENT: Negative for congestion.   Eyes: Positive for blurred vision. Negative for double vision.  Respiratory: Positive for cough, sputum production, shortness of breath and wheezing.   Cardiovascular: Positive for chest pain. Negative for palpitations, orthopnea, leg swelling and PND.  Gastrointestinal: Positive for abdominal pain and nausea. Negative for blood in stool, constipation, diarrhea and vomiting.  Genitourinary: Negative for dysuria, frequency, hematuria and urgency.  Musculoskeletal: Positive for myalgias. Negative for falls.  Neurological: Positive for weakness and headaches. Negative for dizziness, tremors and focal weakness.  Endo/Heme/Allergies: Does not bruise/bleed easily.  Psychiatric/Behavioral: Negative for depression. The patient does not have insomnia.     MEDICATIONS AT HOME:  Prior to Admission medications   Medication Sig Start Date End Date Taking? Authorizing Provider  atorvastatin (LIPITOR) 20 MG tablet TAKE 1 TABLET BY MOUTH ONCE DAILY 09/11/16  Yes [place], Modena Nunnery, MD  cycloSPORINE (RESTASIS) 0.05 % ophthalmic emulsion Place 1 drop into both eyes daily.    Yes [provider]  Epoetin Alfa (EPOGEN IJ) Inject as directed every 30 (thirty) days. Administered at Dialysis center.   Yes [provider]  folic acid (FOLVITE) 1 MG tablet Take 5 mg by mouth daily. 03/14/16  Yes [provider]  gentamicin cream (GARAMYCIN) 0.1 % Apply 1 application topically at bedtime.  12/31/15  Yes [provider]  losartan (COZAAR) 100 MG tablet Take 1 tablet (100 mg total) by mouth daily. Patient taking differently: Take 50 mg by mouth daily.  11/16/15  Yes , Modena Nunnery, MD  methotrexate (RHEUMATREX) 2.5 MG tablet Take 10 mg by mouth once a week. 03/14/16  Yes [provider]  pantoprazole (PROTONIX) 40 MG tablet Take 1 tablet (40 mg total) by mouth 2 (two) times daily  before a meal. Patient taking differently: Take 40 mg by mouth daily.  04/21/15  Yes Vaughan Basta, MD  VELTASSA 8.4 g packet Take 1 packet by mouth daily. 12/20/15  Yes [provider]  albuterol (PROAIR HFA) 108 (90 Base) MCG/ACT inhaler Inhale into the lungs. 09/13/14   [provider]  Artificial Tear Solution (SOOTHE XP OP) Apply 1 drop to eye as needed.    [provider]  azithromycin (ZITHROMAX) 250 MG tablet Day 1: Take 2 daily. Days 2 -5: Take 1 daily. Patient not taking: Reported on 01/29/2017 10/02/16   Dena Billet B, PA-C  HYDROcodone-homatropine [facility]) 5-1.5 MG/5ML syrup Take 5 mLs by mouth every 8 (eight) hours as needed for cough. Patient not taking: Reported on 01/29/2017 10/02/16   Dena Billet B, PA-C  Hypromellose (ARTIFICIAL TEARS OP) Apply 1 drop to eye as needed.    [provider]  ondansetron (ZOFRAN-ODT) 4 MG disintegrating tablet Take 1 tablet (4 mg total) by mouth every 8 (eight) hours as needed for nausea or vomiting. 04/21/15   Vaughan Basta, MD      PHYSICAL EXAMINATION:   VITAL SIGNS: Blood pressure 121/84, pulse 97, temperature 100 F (37.8 C), temperature source Oral,  resp. rate 16, height 6' (1.829 m), weight 81.6 kg (180 lb), SpO2 98 %.  GENERAL:  51 y.o.-year-old Pale patient lying in the bed with no acute distress, diaphoretic, intermittently coughing, uncomfortable.  EYES: Pupils equal, round, reactive to light and accommodation. No scleral icterus. Extraocular muscles intact.  HEENT: Head atraumatic, normocephalic. Oropharynx and nasopharynx clear.  NECK:  Supple, no jugular venous distention. No thyroid enlargement, no tenderness.  LUNGS: Normal breath sounds bilaterally, no wheezing, few scattered rales,rhonchi and crepitations in the right lower lobe, somewhat diminished breath sounds in the right lower lobe. Intermittent use of accessory muscles of respiration, cough, speech, movements.  CARDIOVASCULAR:  S1, S2 . Rhythm was regular, tachycardic. No murmurs, rubs, or gallops.  ABDOMEN: Soft, mildly tender in left lower quadrant at peritoneal dialysis entrance site, no rebound, nondistended. Bowel sounds present. No organomegaly or mass.  EXTREMITIES: No pedal edema, cyanosis, or clubbing.  NEUROLOGIC: Cranial nerves II through XII are intact. Muscle strength 5/5 in all extremities. Sensation intact. Gait not checked.  PSYCHIATRIC: The patient is alert and oriented x 3.  SKIN: Maculopapular rash in lower extremities, not tender to palpation, pruritic, no other lesions, or ulcer.   LABORATORY PANEL:   CBC  Recent Labs Lab 01/29/17 1031  WBC 10.1  HGB 10.3*  HCT 31.3*  PLT 189  MCV 94.4  MCH 31.1  MCHC 33.0  RDW 18.5*  LYMPHSABS 1.2  MONOABS 0.6  EOSABS 0.2  BASOSABS 0.1   ------------------------------------------------------------------------------------------------------------------  Chemistries   Recent Labs Lab 01/29/17 1031  NA 140  K 5.1  CL 105  CO2 24  GLUCOSE 126*  BUN 56*  CREATININE 5.49*  CALCIUM 8.8*  AST 33  ALT 55  ALKPHOS 167*  BILITOT 0.7   ------------------------------------------------------------------------------------------------------------------  Cardiac Enzymes  Recent Labs Lab 01/29/17 1031  TROPONINI <0.03   ------------------------------------------------------------------------------------------------------------------  RADIOLOGY: Dg Chest 2 View  Result Date: 01/29/2017 CLINICAL DATA:  Cough and fever for 2 days.  Fatigue and headache. EXAM: CHEST  2 VIEW COMPARISON:  07/05/2015 in CT chest 05/14/2015. FINDINGS: Trachea is midline. Heart size normal. Lungs are somewhat low in volume with minimal streaky opacification at the lung bases. There may be developing areas of airspace consolidation at the right lung base. No pleural fluid. IMPRESSION: 1. Possible developing areas of consolidation at the right lung base. Pneumonia cannot  be excluded. 2. Bibasilar atelectasis. Electronically Signed   By: Lorin Picket M.D.   On: 01/29/2017 10:50    EKG: Orders placed or performed during the hospital encounter of 01/29/17  . ED EKG 12-Lead  . ED EKG 12-Lead  . EKG 12-Lead  . EKG 12-Lead  EKG in the emergency room revealed sinus tachycardia at a rate of 10 5 bpm, low voltage QRS. No acute STT changes  IMPRESSION AND PLAN:  Active Problems:   Sepsis ([place])   Right lower lobe pneumonia ([place])   ESRD (end stage renal disease) ([place])   Pneumonia  #1. Sepsis due to right lower lobe pneumonia and possibly localized peritonitis, admit patient to medical floor, initiate him on cefepime and vancomycin, check MRSA PCR, get sputum cultures, blood cultures, peritoneal fluid, urine culture #2. Right lower lobe pneumonia, initiate broad-spectrum antibiotic therapy, get sputum cultures #3. End-stage renal disease, on peritoneal dialysis, get nephrologist involved for recommendations #4 anemia due to end-stage renal disease, per nephrologist recommendations #5. Hyperglycemia, get hemoglobin A1c to rule out diabetes  All the records are reviewed and case discussed with ED provider. Management  plans discussed with the patient, family and they are in agreement.  CODE STATUS: Code Status History    Date Active Date Inactive Code Status Order ID Comments User Context   05/31/2015  9:59 AM 06/01/2015  3:31 AM Full Code [id number]  Sabino Dick, MD [place]   04/06/2015 12:39 PM 04/21/2015  1:45 PM Full Code [id number]  Anthonette Legato, MD Inpatient       TOTAL TIME TAKING CARE OF THIS PATIENT: 55 minutes.    Theodoro Grist M.D on 01/29/2017 at 1:18 PM  Between 7am to 6pm - Pager - [phone] After 6pm go to [email or web address] - password [email or web address] [facility]  [facility]  Office  [phone]  CC: Primary care physician; Alycia Rossetti, MD

## 2017-01-29 NOTE — ED Provider Notes (Signed)
[facility] Emergency Department Provider Note       Time seen: ----------------------------------------- 10:18 AM on 01/29/2017 -----------------------------------------     I have reviewed the triage vital signs and the nursing notes.   HISTORY   Chief Complaint Cough and Fever    HPI Joseph [name] is a 51 y.o. male who presents to the ED for persistent cough and fever for the last 48 hours. He also has had a headache. He reports fatigue but denies any specific shortness of breath. Discomfort is 4-10 is head, notes he does have abdominal pain with coughing. Dialysate has been clear and he is not having specific abdominal complaints or problems with peritoneal dialysis. He has had chills.   Past Medical History:  Diagnosis Date  . Blood clot in abdominal vein    happened around age 46 due to traumatic injury  . Blood dyscrasia    amyloidosis  . Central serous chorioretinopathy   . Dry eye   . GERD (gastroesophageal reflux disease)   . Heart murmur    never followed up with this, no problems  . Hyperlipidemia   . Hypertension    improved since starting dialysis  . Pneumonia    as child  . Renal insufficiency     Patient Active Problem List   Diagnosis Date Noted  . ESRD on peritoneal dialysis ([place]) 09/12/2015  . Central serous chorioretinopathy of both eyes 09/12/2015  . Hyperlipidemia 09/12/2015  . GERD (gastroesophageal reflux disease) 09/12/2015  . Hepatitis B vaccination not up to date 09/12/2015  . Renal amyloidosis ([place]) 08/10/2015  . COPD (chronic obstructive pulmonary disease) ([place]) 06/02/2015  . Perinephric hematoma 04/07/2015  . Essential hypertension 04/06/2015    Past Surgical History:  Procedure Laterality Date  . AORTOGRAM Bilateral 04/11/2015   Procedure: Aortogram;  Surgeon: Katha Cabal, MD;  Location: [place] [facility];  Service: Cardiovascular;  Laterality: Bilateral;  . CAPD INSERTION N/A 07/09/2015    Procedure: LAPAROSCOPIC INSERTION CONTINUOUS AMBULATORY PERITONEAL DIALYSIS  (CAPD) CATHETER;  Surgeon: Katha Cabal, MD;  Location: [facility];  Service: Vascular;  Laterality: N/A;  . COLONOSCOPY WITH PROPOFOL N/A 01/11/2016   Procedure: COLONOSCOPY WITH PROPOFOL;  Surgeon: [name], MD;  Location: [facility];  Service: Endoscopy;  Laterality: N/A;  . EXPLORATORY LAPAROTOMY     done to find and remove abdominal blood clot as a teenager  . IMPLANTATION / PLACEMENT PERMANENT EPIDURAL CATHETER Right 2016  . PERIPHERAL VASCULAR CATHETERIZATION Left 04/11/2015   Procedure: Embolization;  Surgeon: Katha Cabal, MD;  Location: [place] [facility];  Service: Cardiovascular;  Laterality: Left;  . PERIPHERAL VASCULAR CATHETERIZATION N/A 04/16/2015   Procedure: Dialysis/Perma Catheter Insertion;  Surgeon: Katha Cabal, MD;  Location: [place] [facility];  Service: Cardiovascular;  Laterality: N/A;  . PERIPHERAL VASCULAR CATHETERIZATION N/A 08/24/2015   Procedure: Dialysis/Perma Catheter Removal;  Surgeon: Katha Cabal, MD;  Location: [place] [facility];  Service: Cardiovascular;  Laterality: N/A;  . RENAL BIOPSY, PERCUTANEOUS  04/06/2015      . TONSILLECTOMY      Allergies Ciprofloxacin hcl; Doxycycline hyclate; and Phenol-glycerin  Social History Social History  Substance Use Topics  . Smoking status: Former Smoker    Packs/day: 0.50    Years: 20.00    Types: Cigarettes    Quit date: 11/04/2006  . Smokeless tobacco: Never Used  . Alcohol use No    Review of Systems Constitutional: Positive for fever, chills Eyes: Negative for vision changes  ENT:  Positive for congestion Cardiovascular: Negative for chest pain. Respiratory: Positive for cough Gastrointestinal: Negative for abdominal pain, vomiting and diarrhea. Genitourinary: Negative for dysuria. Musculoskeletal: Negative for back pain. Skin: Negative for rash. Neurological: Negative for headaches, focal  weakness or numbness.  All systems negative/normal/unremarkable except as stated in the HPI  ____________________________________________   PHYSICAL EXAM:  VITAL SIGNS: ED Triage Vitals  Enc Vitals Group     BP 01/29/17 1001 126/79     Pulse Rate 01/29/17 1001 (!) 117     Resp 01/29/17 1001 18     Temp 01/29/17 1001 (!) 102.9 F (39.4 C)     Temp Source 01/29/17 1001 Oral     SpO2 01/29/17 1001 100 %     Weight 01/29/17 1006 180 lb (81.6 kg)     Height 01/29/17 1006 6' (1.829 m)     Head Circumference --      Peak Flow --      Pain Score 01/29/17 1006 4     Pain Loc --      Pain Edu? --      Excl. in [place]? --     Constitutional: Alert and oriented. Well appearing and in no distress. Eyes: Conjunctivae are normal. Normal extraocular movements. ENT   Head: Normocephalic and atraumatic.   Nose: No congestion/rhinnorhea.   Mouth/Throat: Mucous membranes are moist.   Neck: No stridor. Cardiovascular: Rapid rate, regular rhythm. No murmurs, rubs, or gallops. Respiratory: Normal respiratory effort without tachypnea nor retractions. Breath sounds are clear and equal bilaterally. No wheezes/rales/rhonchi. Gastrointestinal: Soft and nontender. Normal bowel sounds Musculoskeletal: Nontender with normal range of motion in extremities. No lower extremity tenderness nor edema. Neurologic:  Normal speech and language. No gross focal neurologic deficits are appreciated.  Skin:  Skin is warm, dry and intact. No rash noted. Psychiatric: Mood and affect are normal. Speech and behavior are normal.  ____________________________________________  EKG: Interpreted by me.Sinus tachycardia with rate of 105 bpm, normal PR interval, normal QRS, normal QT  ____________________________________________  ED COURSE:  Pertinent labs & imaging results that were available during my care of the patient were reviewed by me and considered in my medical decision making (see chart for  details). Patient presents for fevers and cough, we will assess with labs and imaging as indicated.   Procedures ____________________________________________   LABS (pertinent positives/negatives)  Labs Reviewed  COMPREHENSIVE METABOLIC PANEL - Abnormal; Notable for the following:       Result Value   Glucose, Bld 126 (*)    BUN 56 (*)    Creatinine, Ser 5.49 (*)    Calcium 8.8 (*)    Alkaline Phosphatase 167 (*)    GFR calc non Af Amer 11 (*)    GFR calc Af Amer 12 (*)    All other components within normal limits  CBC WITH DIFFERENTIAL/PLATELET - Abnormal; Notable for the following:    RBC 3.32 (*)    Hemoglobin 10.3 (*)    HCT 31.3 (*)    RDW 18.5 (*)    Neutro Abs 7.9 (*)    All other components within normal limits  BLOOD GAS, VENOUS - Abnormal; Notable for the following:    pCO2, Ven 40 (*)    All other components within normal limits  CULTURE, BLOOD (ROUTINE X 2)  CULTURE, BLOOD (ROUTINE X 2)  URINE CULTURE  BODY FLUID CULTURE  LACTIC ACID, PLASMA  TROPONIN I  LACTIC ACID, PLASMA    RADIOLOGY Images were viewed  by me  Chest x-ray  IMPRESSION: 1. Possible developing areas of consolidation at the right lung base. Pneumonia cannot be excluded. 2. Bibasilar atelectasis. ____________________________________________  FINAL ASSESSMENT AND PLAN  SIRS, Pneumonia  Plan: Patient's labs and imaging were dictated above. Patient had presented for symptoms of systemic inflammatory response likely from right lower lobe pneumonia. He was given broad-spectrum antibiotics and still remains very diaphoretic although his heart rate has improved. I will discuss with the hospitalist for admission.   Earleen Newport, MD   Note: This note was generated in part or whole with voice recognition software. Voice recognition is usually quite accurate but there are transcription errors that can and very often do occur. I apologize for any typographical errors that were not  detected and corrected.     Earleen Newport, MD 01/29/17 1256

## 2017-01-29 NOTE — Telephone Encounter (Signed)
Noted patient on PCP schedule for fever (t max 104). Call placed to patient wife Claudine to inquire. States that patient has been running fever 2 days. States that APAP is not relieving fever.   Per MD, patient has complicated history and awaiting kidney transplant. Concern for peritonitis. Concerned that he may worsen in outpatient setting. Advised to go to ER for eval. Patient wife made aware. Verbalized understanding.

## 2017-01-30 ENCOUNTER — Inpatient Hospital Stay: Payer: 59

## 2017-01-30 LAB — BODY FLUID CELL COUNT WITH DIFFERENTIAL: Total Nucleated Cell Count, Fluid: 59 cu mm (ref 0–1000)

## 2017-01-30 LAB — BASIC METABOLIC PANEL
Anion gap: 7 (ref 5–15)
BUN: 57 mg/dL — AB (ref 6–20)
CO2: 24 mmol/L (ref 22–32)
Calcium: 8.3 mg/dL — ABNORMAL LOW (ref 8.9–10.3)
Chloride: 107 mmol/L (ref 101–111)
Creatinine, Ser: 6.1 mg/dL — ABNORMAL HIGH (ref 0.61–1.24)
GFR calc Af Amer: 11 mL/min — ABNORMAL LOW (ref >60)
GFR, EST NON AFRICAN AMERICAN: 9 mL/min — AB (ref >60)
GLUCOSE: 146 mg/dL — AB (ref 65–99)
POTASSIUM: 4.5 mmol/L (ref 3.5–5.1)
Sodium: 138 mmol/L (ref 135–145)

## 2017-01-30 LAB — CBC
HEMATOCRIT: 27.6 % — AB (ref 40.0–52.0)
Hemoglobin: 9.2 g/dL — ABNORMAL LOW (ref 13.0–18.0)
MCH: 31.4 pg (ref 26.0–34.0)
MCHC: 33.4 g/dL (ref 32.0–36.0)
MCV: 93.9 fL (ref 80.0–100.0)
Platelets: 165 10*3/uL (ref 150–440)
RBC: 2.94 MIL/uL — ABNORMAL LOW (ref 4.40–5.90)
RDW: 18.5 % — AB (ref 11.5–14.5)
WBC: 9.5 10*3/uL (ref 3.8–10.6)

## 2017-01-30 LAB — URINE CULTURE: Culture: <10000 — AB

## 2017-01-30 LAB — GLUCOSE, CAPILLARY: GLUCOSE-CAPILLARY: 110 mg/dL — AB (ref 65–99)

## 2017-01-30 LAB — BLOOD GAS, VENOUS
ACID-BASE DEFICIT: 0.7 mmol/L (ref 0.0–2.0)
BICARBONATE: 24.2 mmol/L (ref 20.0–28.0)
PH VEN: 7.39 (ref 7.250–7.430)
Patient temperature: 37
pCO2, Ven: 40 mmHg — ABNORMAL LOW (ref 44.0–60.0)

## 2017-01-30 LAB — TROPONIN I: Troponin I: 0.04 ng/mL (ref <0.03)

## 2017-01-30 LAB — HEMOGLOBIN A1C
Hgb A1c MFr Bld: 5.8 % — ABNORMAL HIGH (ref 4.8–5.6)
MEAN PLASMA GLUCOSE: 120 mg/dL

## 2017-01-30 LAB — HIV ANTIBODY (ROUTINE TESTING W REFLEX): HIV SCREEN 4TH GENERATION: NONREACTIVE

## 2017-01-30 MED ORDER — AZITHROMYCIN 250 MG PO TABS
500.0000 mg | ORAL_TABLET | Freq: Every day | ORAL | Status: DC
  Administered 2017-01-30 – 2017-01-31 (×2): 500 mg via ORAL
  Filled 2017-01-30 (×2): qty 2

## 2017-01-30 MED ORDER — DEXTROSE 5 % IV SOLN
1.0000 g | INTRAVENOUS | Status: DC
  Administered 2017-01-30: 1 g via INTRAVENOUS
  Filled 2017-01-30 (×2): qty 10

## 2017-01-30 NOTE — Progress Notes (Signed)
PD START

## 2017-01-30 NOTE — Progress Notes (Signed)
Post pd assessment. Patient tolerated well without issue. Total UF 69mL. Dr. Holley Raring informed of results.

## 2017-01-30 NOTE — Progress Notes (Signed)
[facility] NAME: Joseph [name]    MR#:  [id number]  DATE OF BIRTH:  1966/03/26  SUBJECTIVE:    REVIEW OF SYSTEMS:   ROS Tolerating Diet: Tolerating PT:   DRUG ALLERGIES:   Allergies  Allergen Reactions  . Ciprofloxacin Hcl Swelling    High fever  . Doxycycline Hyclate Swelling  . Phenol-Glycerin Swelling    VITALS:  Blood pressure 107/74, pulse 76, temperature 100.3 F (37.9 C), temperature source Oral, resp. rate 18, height 6' (1.829 m), weight 81 kg (178 lb 9.2 oz), SpO2 97 %.  PHYSICAL EXAMINATION:   Physical Exam  GENERAL:  51 y.o.-year-old patient lying in the bed with no acute distress.  EYES: Pupils equal, round, reactive to light and accommodation. No scleral icterus. Extraocular muscles intact.  HEENT: Head atraumatic, normocephalic. Oropharynx and nasopharynx clear.  NECK:  Supple, no jugular venous distention. No thyroid enlargement, no tenderness.  LUNGS: Normal breath sounds bilaterally, no wheezing, rales, rhonchi. No use of accessory muscles of respiration.  CARDIOVASCULAR: S1, S2 normal. No murmurs, rubs, or gallops.  ABDOMEN: Soft, nontender, nondistended. Bowel sounds present. No organomegaly or mass.  EXTREMITIES: No cyanosis, clubbing or edema b/l.    NEUROLOGIC: Cranial nerves II through XII are intact. No focal Motor or sensory deficits b/l.   PSYCHIATRIC:  patient is alert and oriented x 3.  SKIN: No obvious rash, lesion, or ulcer.   LABORATORY PANEL:  CBC  Recent Labs Lab 01/30/17 0204  WBC 9.5  HGB 9.2*  HCT 27.6*  PLT 165    Chemistries   Recent Labs Lab 01/29/17 1031 01/30/17 0204  NA 140 138  K 5.1 4.5  CL 105 107  CO2 24 24  GLUCOSE 126* 146*  BUN 56* 57*  CREATININE 5.49* 6.10*  CALCIUM 8.8* 8.3*  AST 33  --   ALT 55  --   ALKPHOS 167*  --   BILITOT 0.7  --    Cardiac Enzymes  Recent Labs Lab 01/30/17 0204  TROPONINI 0.04*   RADIOLOGY:  Dg Chest  2 View  Result Date: 01/29/2017 CLINICAL DATA:  Cough and fever for 2 days.  Fatigue and headache. EXAM: CHEST  2 VIEW COMPARISON:  07/05/2015 in CT chest 05/14/2015. FINDINGS: Trachea is midline. Heart size normal. Lungs are somewhat low in volume with minimal streaky opacification at the lung bases. There may be developing areas of airspace consolidation at the right lung base. No pleural fluid. IMPRESSION: 1. Possible developing areas of consolidation at the right lung base. Pneumonia cannot be excluded. 2. Bibasilar atelectasis. Electronically Signed   By: Lorin Picket M.D.   On: 01/29/2017 10:50   Ct Chest Wo Contrast  Result Date: 01/30/2017 CLINICAL DATA:  51 year old male with a history of fever and abnormal finding on prior chest x-ray EXAM: CT CHEST WITHOUT CONTRAST TECHNIQUE: Multidetector CT imaging of the chest was performed following the standard protocol without IV contrast. COMPARISON:  Chest x-ray 01/29/2017 FINDINGS: Cardiovascular: Heart size within normal limits. No pericardial fluid/thickening. No significant coronary calcifications. Unremarkable caliber, course and contour of the thoracic aorta with no significant calcifications. Mediastinum/Nodes: Multiple mediastinal lymph nodes, many of which measure enlarged. Index lymph node in the upper right peritracheal nodal station measures 14 mm. Lowest right-sided peritracheal node measures 16 mm. Multiple subcarinal nodes. Right-sided hilar nodes with the largest measuring 18 mm. While the number and distribution of nodes is unchanged from the comparison CT of 05/14/2015,  size has increased on the index lymph nodes. No axillary lymphadenopathy. Lungs/Pleura: Finding on the prior chest x-ray corresponds to mixed ground-glass and peribronchovascular nodular opacities of the right lower lobe, with associated endobronchial debris. Trace right-sided pleural effusion. No significant airspace disease of the left lung with minimal atelectasis. Upper  Abdomen: Embolic material partially imaged within left renal vasculature. Cholelithiasis. Low-density ascites of the upper abdomen. Musculoskeletal: Soft tissue nodules of posterior intercostal location on both right and the left. Overall, the soft tissue nodules have not changed in number from the CT of 05/14/2015, although are slightly more prominent. IMPRESSION: Right lower lobe pneumonia with associated mediastinal adenopathy. There are several focal soft tissue nodules in the subpleural space of the bilateral posterior intercostal spaces. These have been present since the comparison CT of 05/14/2015, though perhaps slightly more prominent on today's study. Etiology uncertain, with differential diagnosis including lymph nodes, multiple nerve sheath tumors, or possibly foci of extra medullary hematopoiesis in this patient with a history of amyloidosis. If there is concern for lymphoproliferative disorder, correlation with lab values may be useful. Trace ascites of the upper abdomen. Electronically Signed   By: Corrie Mckusick D.O.   On: 01/30/2017 13:01   ASSESSMENT AND PLAN:  : [name]  is a 51 y.o. male with a known history of Amyloidosis, end-stage renal disease on peritoneal dialysis, dry eyes, hypertension, hyperlipidemia, who presents to the hospital with complaints of high fevers which started about 2 days ago, cough, light green phlegm, wheezing, shortness of breath, headaches. On arrival to the hospital patient was febrile with temperature of 102.9, tachycardic.  #1. Sepsis due to right lower lobe pneumonia -Blood cultures are negative -IV Rocephin and Zithromax -Peritoneal fluid cultures negative -Sputum culture rare gram-positive and gram-negative -White count stable. Patient is afebrile.  #2. Right lower lobe pneumonia -cont broad-spectrum antibiotic therapy, get sputum cultures  #3. End-stage renal disease, on peritoneal dialysis -Appreciate nephrology input  #4 anemia due to  end-stage renal disease, per nephrologist recommendations  Spoke with wife and patient.   Case discussed with Care Management/Social Worker. Management plans discussed with the patient, family and they are in agreement.  CODE STATUS: Full  DVT Prophylaxis: SCD teds. Patient declining heparin  TOTAL TIME TAKING CARE OF THIS PATIENT: 30 minutes.  >50% time spent on counselling and coordination of care  POSSIBLE D/C IN *1-2 DAYS, DEPENDING ON CLINICAL CONDITION.  Note: This dictation was prepared with Dragon dictation along with smaller phrase technology. Any transcriptional errors that result from this process are unintentional.  Raybon Conard M.D on 01/30/2017 at 3:01 PM  Between 7am to 6pm - Pager - [phone]  After 6pm go to [email or web address] - password [email or web address] [facility]  Office  [phone]  CC: Primary care physician; Alycia Rossetti, MD

## 2017-01-30 NOTE — Progress Notes (Signed)
[facility]  ROUNDING NOTE   Subjective:  Patient well known to [place] as we follow him for end-stage renal disease on peritoneal dialysis. He presents now with fever, cough, and shortness of breath. There appeared to be an early infiltrate on chest x-ray. Patient had a positive sick contact with his grandchildren. He also recently visited the [employer].   Objective:  Vital signs in last 24 hours:  Temp:  [99.1 F (37.3 C)-101.9 F (38.8 C)] 100.3 F (37.9 C) (08/07 0501) Pulse Rate:  [86-106] 86 (08/07 0501) Resp:  [16-32] 18 (08/07 0501) BP: (102-121)/(70-88) 118/70 (08/07 0501) SpO2:  [91 %-99 %] 99 % (08/07 0501) FiO2 (%):  [21 %] 21 % (08/06 1419) Weight:  [81 kg (178 lb 9.2 oz)-81.6 kg (179 lb 14.3 oz)] 81 kg (178 lb 9.2 oz) (08/07 0800)  Weight change:  Filed Weights   01/29/17 1006 01/29/17 1950 01/30/17 0800  Weight: 81.6 kg (180 lb) 81.6 kg (179 lb 14.3 oz) 81 kg (178 lb 9.2 oz)    Intake/Output: I/O last 3 completed shifts: In: 87867 [P.O.:240; I.V.:1000; Other:8000; IV Piggyback:1100] Out: -    Intake/Output this shift:  Total I/O In: 360 [P.O.:360] Out: 679 [Other:679]  Physical Exam: General: No acute distress  Head: Normocephalic, atraumatic. Moist oral mucosal membranes  Eyes: Anicteric  Neck: Supple, trachea midline  Lungs:  Right-sided basilar rales, normal effort  Heart: S1S2 no rubs  Abdomen:  Soft, nontender, bowel sounds present  Extremities: No peripheral edema.  Neurologic: Awake, alert, following commands  Skin: No lesions  Access: PD catheter in place.    Basic Metabolic Panel:  Recent Labs Lab 01/29/17 1031 01/30/17 0204  NA 140 138  K 5.1 4.5  CL 105 107  CO2 24 24  GLUCOSE 126* 146*  BUN 56* 57*  CREATININE 5.49* 6.10*  CALCIUM 8.8* 8.3*    Liver Function Tests:  Recent Labs Lab 01/29/17 1031  AST 33  ALT 55  ALKPHOS 167*  BILITOT 0.7  PROT 6.8  ALBUMIN 3.5   No results for input(s):  LIPASE, AMYLASE in the last 168 hours. No results for input(s): AMMONIA in the last 168 hours.  CBC:  Recent Labs Lab 01/29/17 1031 01/30/17 0204  WBC 10.1 9.5  NEUTROABS 7.9*  --   HGB 10.3* 9.2*  HCT 31.3* 27.6*  MCV 94.4 93.9  PLT 189 165    Cardiac Enzymes:  Recent Labs Lab 01/29/17 1031 01/29/17 1451 01/29/17 1950 01/30/17 0204  TROPONINI <0.03 0.03* 0.04* 0.04*    BNP: Invalid input(s): POCBNP  CBG:  Recent Labs Lab 01/30/17 0743  GLUCAP 110*    Microbiology: Results for orders placed or performed during the hospital encounter of 01/29/17  Blood Culture (routine x 2)     Status: None (Preliminary result)   Collection Time: 01/29/17 10:32 AM  Result Value Ref Range Status   Specimen Description BLOOD LEFT AC  Final   Special Requests   Final    BOTTLES DRAWN AEROBIC AND ANAEROBIC Blood Culture adequate volume   Culture NO GROWTH < 24 HOURS  Final   Report Status PENDING  Incomplete  Blood Culture (routine x 2)     Status: None (Preliminary result)   Collection Time: 01/29/17 10:33 AM  Result Value Ref Range Status   Specimen Description BLOOD LEFT WRIST  Final   Special Requests   Final    BOTTLES DRAWN AEROBIC AND ANAEROBIC Blood Culture results may not be optimal due to  an excessive volume of blood received in culture bottles   Culture NO GROWTH < 24 HOURS  Final   Report Status PENDING  Incomplete  Urine culture     Status: Abnormal   Collection Time: 01/29/17  1:00 PM  Result Value Ref Range Status   Specimen Description URINE, RANDOM  Final   Special Requests NONE  Final   Culture (A)  Final    <10,000 COLONIES/mL INSIGNIFICANT GROWTH Performed at [facility], [address] [place], [place] [address]    Report Status 01/30/2017 FINAL  Final  Body fluid culture     Status: None (Preliminary result)   Collection Time: 01/29/17  2:20 PM  Result Value Ref Range Status   Specimen Description DIALYSATE  Final   Special Requests  Normal  Final   Gram Stain CYTOSPIN SMEAR NO WBC SEEN NO ORGANISMS SEEN   Final   Culture   Final    NO GROWTH < 24 HOURS Performed at [facility], [place] [address] [place], [place] [address]    Report Status PENDING  Incomplete  Culture, sputum-assessment     Status: None   Collection Time: 01/29/17  2:47 PM  Result Value Ref Range Status   Specimen Description EXPECTORATED SPUTUM  Final   Special Requests NONE  Final   Sputum evaluation THIS SPECIMEN IS ACCEPTABLE FOR SPUTUM CULTURE  Final   Report Status 01/29/2017 FINAL  Final  Culture, respiratory (NON-Expectorated)     Status: None (Preliminary result)   Collection Time: 01/29/17  2:47 PM  Result Value Ref Range Status   Specimen Description EXPECTORATED SPUTUM  Final   Special Requests NONE Reflexed from [id number]  Final   Gram Stain   Final    ABUNDANT WBC PRESENT,BOTH PMN AND MONONUCLEAR RARE SQUAMOUS EPITHELIAL CELLS PRESENT RARE GRAM POSITIVE COCCI RARE GRAM POSITIVE RODS Performed at [facility], [place] [address] [place], [place] [address]    Culture PENDING  Incomplete   Report Status PENDING  Incomplete  MRSA PCR Screening     Status: None   Collection Time: 01/29/17  2:48 PM  Result Value Ref Range Status   MRSA by PCR NEGATIVE NEGATIVE Final    Comment:        The GeneXpert MRSA Assay (FDA approved for NASAL specimens only), is one component of a comprehensive MRSA colonization surveillance program. It is not intended to diagnose MRSA infection nor to guide or monitor treatment for MRSA infections.     Coagulation Studies: No results for input(s): LABPROT, INR in the last 72 hours.  Urinalysis: No results for input(s): COLORURINE, LABSPEC, PHURINE, GLUCOSEU, HGBUR, BILIRUBINUR, KETONESUR, PROTEINUR, UROBILINOGEN, NITRITE, LEUKOCYTESUR in the last 72 hours.  Invalid input(s): APPERANCEUR    Imaging: Dg Chest 2 View  Result Date: 01/29/2017 CLINICAL DATA:  Cough and fever for 2 days.   Fatigue and headache. EXAM: CHEST  2 VIEW COMPARISON:  07/05/2015 in CT chest 05/14/2015. FINDINGS: Trachea is midline. Heart size normal. Lungs are somewhat low in volume with minimal streaky opacification at the lung bases. There may be developing areas of airspace consolidation at the right lung base. No pleural fluid. IMPRESSION: 1. Possible developing areas of consolidation at the right lung base. Pneumonia cannot be excluded. 2. Bibasilar atelectasis. Electronically Signed   By: Lorin Picket M.D.   On: 01/29/2017 10:50     Medications:   . sodium chloride    . cefTRIAXone (ROCEPHIN) 1 g IVPB     .  aspirin EC  81 mg Oral Daily  . atorvastatin  20 mg Oral Daily  . azithromycin  500 mg Oral Daily  . cycloSPORINE  1 drop Both Eyes Daily  . folic acid  5 mg Oral Daily  . gentamicin cream  1 application Topical Daily  . gentamicin ointment   Topical QHS  . heparin  5,000 Units Subcutaneous Q8H  . losartan  50 mg Oral Daily  . methotrexate  10 mg Oral Weekly  . metoprolol tartrate  25 mg Oral BID  . pantoprazole  40 mg Oral Daily  . patiromer  1 packet Oral Daily  . sodium chloride flush  3 mL Intravenous Q12H   sodium chloride, acetaminophen **OR** acetaminophen, albuterol, heparin, ondansetron **OR** ondansetron (ZOFRAN) IV, polyvinyl alcohol, sodium chloride flush  Assessment/ Plan:  51 y.o. male with past medical history of hypertension, hyperlipidemia, anemia chronic kidney disease, peptic ulcer disease, ESRD on PD, anemia of CKD proteinuria, hyperkalemia, s/p renal biopsy 04/06/15, renal biopsy showed amyloid nephropathy, which is likely hereditary in nature, now admitted with pneumonia/fever.  1.  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 We will continue the patient on peritoneal dialysis schedule.  2. Anemia of chronic kidney disease. Hemoglobin currently 9.2. Patient does receive Epogen as an outpatient. We will hold off on administering Epogen while  here.  3. Secondary hyperparathyroidism.  Continue to periodically monitor phosphorus.  4. Hyperkalemia. Serum potassium 4.5. Continue the patient on veltassa 1 packet by mouth daily.  5. Bacterial pneumonia.  The patient is currently receiving ceftriaxone 1 g IV daily.  In addition he is receiving azithromycin. Further plan as per hospitalist.  We will also obtain a CT scan of the chest for further evaluation.    LOS: 1 Emilia Kayes 8/7/201811:33 AM

## 2017-01-30 NOTE — Progress Notes (Signed)
Pt PD start w/ no complications. Pt alert, no c/o, wife present. Dressing change complete. Dry bloody discharge at exit site. MD notified.

## 2017-01-30 NOTE — Care Management (Signed)
Information e faxed to dialysis liaison Elvera Bicker

## 2017-01-30 NOTE — Progress Notes (Signed)
Paged MD about troponin that still 0.04.[email or web address] for a call back.

## 2017-01-31 DIAGNOSIS — Z992 Dependence on renal dialysis: Secondary | ICD-10-CM | POA: Diagnosis not present

## 2017-01-31 DIAGNOSIS — N186 End stage renal disease: Secondary | ICD-10-CM | POA: Diagnosis not present

## 2017-01-31 LAB — GLUCOSE, CAPILLARY: GLUCOSE-CAPILLARY: 112 mg/dL — AB (ref 65–99)

## 2017-01-31 LAB — HEPATITIS B CORE ANTIBODY, TOTAL: HEP B C TOTAL AB: NEGATIVE

## 2017-01-31 LAB — HEPATITIS B SURFACE ANTIGEN: HEP B S AG: NEGATIVE

## 2017-01-31 LAB — HEPATITIS B SURFACE ANTIBODY, QUANTITATIVE

## 2017-01-31 LAB — PATHOLOGIST SMEAR REVIEW

## 2017-01-31 MED ORDER — BENZONATATE 100 MG PO CAPS
100.0000 mg | ORAL_CAPSULE | Freq: Three times a day (TID) | ORAL | Status: DC
  Administered 2017-01-31: 100 mg via ORAL
  Filled 2017-01-31: qty 1

## 2017-01-31 MED ORDER — CEFUROXIME AXETIL 500 MG PO TABS: 500.0000 mg | ORAL_TABLET | Freq: Two times a day (BID) | ORAL | Status: DC

## 2017-01-31 MED ORDER — AZITHROMYCIN 250 MG PO TABS: ORAL_TABLET | ORAL | 0 refills | Status: DC

## 2017-01-31 MED ORDER — HYDROCOD POLST-CPM POLST ER 10-8 MG/5ML PO SUER
5.0000 mL | Freq: Two times a day (BID) | ORAL | Status: DC
  Administered 2017-01-31: 5 mL via ORAL
  Filled 2017-01-31: qty 5

## 2017-01-31 MED ORDER — HYDROCOD POLST-CPM POLST ER 10-8 MG/5ML PO SUER: 5.0000 mL | Freq: Two times a day (BID) | ORAL | 0 refills | Status: DC

## 2017-01-31 MED ORDER — BENZONATATE 100 MG PO CAPS: 100.0000 mg | ORAL_CAPSULE | Freq: Three times a day (TID) | ORAL | 0 refills | Status: DC

## 2017-01-31 MED ORDER — CEFUROXIME AXETIL 500 MG PO TABS: 500.0000 mg | ORAL_TABLET | Freq: Every morning | ORAL | 0 refills | Status: DC

## 2017-01-31 NOTE — Progress Notes (Signed)
[facility]  ROUNDING NOTE   Subjective:  Pt seen at bedside. Recovering from right sided pneumonia.  CT confirmed this.   Objective:  Vital signs in last 24 hours:  Temp:  [97.8 F (36.6 C)-99.8 F (37.7 C)] 98.4 F (36.9 C) (08/08 0458) Pulse Rate:  [73-83] 73 (08/08 0458) Resp:  [19] 19 (08/08 0458) BP: (107-130)/(74-82) 130/78 (08/08 0458) SpO2:  [95 %-97 %] 96 % (08/08 0458)  Weight change: -0.648 kg (-1 lb 6.8 oz) Filed Weights   01/29/17 1006 01/29/17 1950 01/30/17 0800  Weight: 81.6 kg (180 lb) 81.6 kg (179 lb 14.3 oz) 81 kg (178 lb 9.2 oz)    Intake/Output: I/O last 3 completed shifts: In: 35329 [P.O.:3096; Other:8000; IV Piggyback:110] Out: 679 [Other:679]   Intake/Output this shift:  Total I/O In: 240 [P.O.:240] Out: 1602 [Other:1602]  Physical Exam: General: No acute distress  Head: Normocephalic, atraumatic. Moist oral mucosal membranes  Eyes: Anicteric  Neck: Supple, trachea midline  Lungs:  Right-sided basilar rales, normal effort  Heart: S1S2 no rubs  Abdomen:  Soft, nontender, bowel sounds present  Extremities: No peripheral edema.  Neurologic: Awake, alert, following commands  Skin: No lesions  Access: PD catheter in place.    Basic Metabolic Panel:  Recent Labs Lab 01/29/17 1031 01/30/17 0204  NA 140 138  K 5.1 4.5  CL 105 107  CO2 24 24  GLUCOSE 126* 146*  BUN 56* 57*  CREATININE 5.49* 6.10*  CALCIUM 8.8* 8.3*    Liver Function Tests:  Recent Labs Lab 01/29/17 1031  AST 33  ALT 55  ALKPHOS 167*  BILITOT 0.7  PROT 6.8  ALBUMIN 3.5   No results for input(s): LIPASE, AMYLASE in the last 168 hours. No results for input(s): AMMONIA in the last 168 hours.  CBC:  Recent Labs Lab 01/29/17 1031 01/30/17 0204  WBC 10.1 9.5  NEUTROABS 7.9*  --   HGB 10.3* 9.2*  HCT 31.3* 27.6*  MCV 94.4 93.9  PLT 189 165    Cardiac Enzymes:  Recent Labs Lab 01/29/17 1031 01/29/17 1451 01/29/17 1950  01/30/17 0204  TROPONINI <0.03 0.03* 0.04* 0.04*    BNP: Invalid input(s): POCBNP  CBG:  Recent Labs Lab 01/30/17 0743 01/31/17 0729  GLUCAP 110* 112*    Microbiology: Results for orders placed or performed during the hospital encounter of 01/29/17  Blood Culture (routine x 2)     Status: None (Preliminary result)   Collection Time: 01/29/17 10:32 AM  Result Value Ref Range Status   Specimen Description BLOOD LEFT AC  Final   Special Requests   Final    BOTTLES DRAWN AEROBIC AND ANAEROBIC Blood Culture adequate volume   Culture NO GROWTH 2 DAYS  Final   Report Status PENDING  Incomplete  Blood Culture (routine x 2)     Status: None (Preliminary result)   Collection Time: 01/29/17 10:33 AM  Result Value Ref Range Status   Specimen Description BLOOD LEFT WRIST  Final   Special Requests   Final    BOTTLES DRAWN AEROBIC AND ANAEROBIC Blood Culture results may not be optimal due to an excessive volume of blood received in culture bottles   Culture NO GROWTH 2 DAYS  Final   Report Status PENDING  Incomplete  Urine culture     Status: Abnormal   Collection Time: 01/29/17  1:00 PM  Result Value Ref Range Status   Specimen Description URINE, RANDOM  Final   Special Requests NONE  Final  Culture (A)  Final    <10,000 COLONIES/mL INSIGNIFICANT GROWTH Performed at [facility] [address] [place], [place] [address]    Report Status 01/30/2017 FINAL  Final  Body fluid culture     Status: None (Preliminary result)   Collection Time: 01/29/17  2:20 PM  Result Value Ref Range Status   Specimen Description DIALYSATE  Final   Special Requests Normal  Final   Gram Stain CYTOSPIN SMEAR NO WBC SEEN NO ORGANISMS SEEN   Final   Culture   Final    NO GROWTH < 24 HOURS Performed at [facility], [place] [address] [place], [place] [address]    Report Status PENDING  Incomplete  Culture, sputum-assessment     Status: None   Collection Time: 01/29/17  2:47 PM  Result  Value Ref Range Status   Specimen Description EXPECTORATED SPUTUM  Final   Special Requests NONE  Final   Sputum evaluation THIS SPECIMEN IS ACCEPTABLE FOR SPUTUM CULTURE  Final   Report Status 01/29/2017 FINAL  Final  Culture, respiratory (NON-Expectorated)     Status: None (Preliminary result)   Collection Time: 01/29/17  2:47 PM  Result Value Ref Range Status   Specimen Description EXPECTORATED SPUTUM  Final   Special Requests NONE Reflexed from [id number]  Final   Gram Stain   Final    ABUNDANT WBC PRESENT,BOTH PMN AND MONONUCLEAR RARE SQUAMOUS EPITHELIAL CELLS PRESENT RARE GRAM POSITIVE COCCI RARE GRAM POSITIVE RODS    Culture   Final    CULTURE REINCUBATED FOR BETTER GROWTH Performed at [facility], [place] [address] [place],  [address]    Report Status PENDING  Incomplete  MRSA PCR Screening     Status: None   Collection Time: 01/29/17  2:48 PM  Result Value Ref Range Status   MRSA by PCR NEGATIVE NEGATIVE Final    Comment:        The GeneXpert MRSA Assay (FDA approved for NASAL specimens only), is one component of a comprehensive MRSA colonization surveillance program. It is not intended to diagnose MRSA infection nor to guide or monitor treatment for MRSA infections.     Coagulation Studies: No results for input(s): LABPROT, INR in the last 72 hours.  Urinalysis: No results for input(s): COLORURINE, LABSPEC, PHURINE, GLUCOSEU, HGBUR, BILIRUBINUR, KETONESUR, PROTEINUR, UROBILINOGEN, NITRITE, LEUKOCYTESUR in the last 72 hours.  Invalid input(s): APPERANCEUR    Imaging: Dg Chest 2 View  Result Date: 01/29/2017 CLINICAL DATA:  Cough and fever for 2 days.  Fatigue and headache. EXAM: CHEST  2 VIEW COMPARISON:  07/05/2015 in CT chest 05/14/2015. FINDINGS: Trachea is midline. Heart size normal. Lungs are somewhat low in volume with minimal streaky opacification at the lung bases. There may be developing areas of airspace consolidation at the right lung base.  No pleural fluid. IMPRESSION: 1. Possible developing areas of consolidation at the right lung base. Pneumonia cannot be excluded. 2. Bibasilar atelectasis. Electronically Signed   By: Lorin Picket M.D.   On: 01/29/2017 10:50   Ct Chest Wo Contrast  Result Date: 01/30/2017 CLINICAL DATA:  51 year old male with a history of fever and abnormal finding on prior chest x-ray EXAM: CT CHEST WITHOUT CONTRAST TECHNIQUE: Multidetector CT imaging of the chest was performed following the standard protocol without IV contrast. COMPARISON:  Chest x-ray 01/29/2017 FINDINGS: Cardiovascular: Heart size within normal limits. No pericardial fluid/thickening. No significant coronary calcifications. Unremarkable caliber, course and contour of the thoracic aorta with  no significant calcifications. Mediastinum/Nodes: Multiple mediastinal lymph nodes, many of which measure enlarged. Index lymph node in the upper right peritracheal nodal station measures 14 mm. Lowest right-sided peritracheal node measures 16 mm. Multiple subcarinal nodes. Right-sided hilar nodes with the largest measuring 18 mm. While the number and distribution of nodes is unchanged from the comparison CT of 05/14/2015, size has increased on the index lymph nodes. No axillary lymphadenopathy. Lungs/Pleura: Finding on the prior chest x-ray corresponds to mixed ground-glass and peribronchovascular nodular opacities of the right lower lobe, with associated endobronchial debris. Trace right-sided pleural effusion. No significant airspace disease of the left lung with minimal atelectasis. Upper Abdomen: Embolic material partially imaged within left renal vasculature. Cholelithiasis. Low-density ascites of the upper abdomen. Musculoskeletal: Soft tissue nodules of posterior intercostal location on both right and the left. Overall, the soft tissue nodules have not changed in number from the CT of 05/14/2015, although are slightly more prominent. IMPRESSION: Right lower  lobe pneumonia with associated mediastinal adenopathy. There are several focal soft tissue nodules in the subpleural space of the bilateral posterior intercostal spaces. These have been present since the comparison CT of 05/14/2015, though perhaps slightly more prominent on today's study. Etiology uncertain, with differential diagnosis including lymph nodes, multiple nerve sheath tumors, or possibly foci of extra medullary hematopoiesis in this patient with a history of amyloidosis. If there is concern for lymphoproliferative disorder, correlation with lab values may be useful. Trace ascites of the upper abdomen. Electronically Signed   By: Corrie Mckusick D.O.   On: 01/30/2017 13:01     Medications:   . sodium chloride     . aspirin EC  81 mg Oral Daily  . atorvastatin  20 mg Oral Daily  . azithromycin  500 mg Oral Daily  . benzonatate  100 mg Oral TID  . cefUROXime  500 mg Oral BID WC  . chlorpheniramine-HYDROcodone  5 mL Oral Q12H  . cycloSPORINE  1 drop Both Eyes Daily  . folic acid  5 mg Oral Daily  . gentamicin cream  1 application Topical Daily  . gentamicin ointment   Topical QHS  . heparin  5,000 Units Subcutaneous Q8H  . losartan  50 mg Oral Daily  . methotrexate  10 mg Oral Weekly  . pantoprazole  40 mg Oral Daily  . patiromer  1 packet Oral Daily  . sodium chloride flush  3 mL Intravenous Q12H   sodium chloride, acetaminophen **OR** acetaminophen, albuterol, heparin, ondansetron **OR** ondansetron (ZOFRAN) IV, polyvinyl alcohol, sodium chloride flush  Assessment/ Plan:  51 y.o. male with past medical history of hypertension, hyperlipidemia, anemia chronic kidney disease, peptic ulcer disease, ESRD on PD, anemia of CKD proteinuria, hyperkalemia, s/p renal biopsy 04/06/15, renal biopsy showed amyloid nephropathy, which is likely hereditary in nature, now admitted with pneumonia/fever.  1.  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 Pt had low drain alarm, will  continue to monitor pt's status on PD at home.   2. Anemia of chronic kidney disease. Continue epogen as outpt.   3. Secondary hyperparathyroidism.  Check phos as an outpt.    4. Hyperkalemia. Doing well with veltassa, K under control, will monitor as outpt.   5. Bacterial pneumonia.  Confirmed on CT Scan.  Abx therapy as per hospitalist.      LOS: 2 Kaidyn Hernandes 8/8/201810:31 AM

## 2017-01-31 NOTE — Discharge Summary (Signed)
[facility] [place] NAME: Joseph [name]    MR#:  [id number]  DATE OF BIRTH:  [birth year]  DATE OF ADMISSION:  01/29/2017 ADMITTING PHYSICIAN: Joseph Grist, MD  DATE OF DISCHARGE: 01/31/17  PRIMARY CARE PHYSICIAN: Joseph Rossetti, MD    ADMISSION DIAGNOSIS:  SIRS (systemic inflammatory response syndrome) ([place]) [R65.10] Community acquired pneumonia of right lower lobe of lung ([place]) [J18.1]  DISCHARGE DIAGNOSIS:  Right sided pneumonia Sepsis on admission---resolved SECONDARY DIAGNOSIS:   Past Medical History:  Diagnosis Date  . Blood clot in abdominal vein    happened around age 72 due to traumatic injury  . Blood dyscrasia    amyloidosis  . Central serous chorioretinopathy   . Dry eye   . GERD (gastroesophageal reflux disease)   . Heart murmur    never followed up with this, no problems  . Hyperlipidemia   . Hypertension    improved since starting dialysis  . Pneumonia    as child  . Renal insufficiency     HOSPITAL COURSE:  [name] a 51 y.o.malewith a known history of Amyloidosis, end-stage renal disease on peritoneal dialysis, dry eyes, hypertension, hyperlipidemia, who presents to the hospital with complaints of high fevers which started about 2 days ago, cough, light green phlegm, wheezing, shortness of breath, headaches. On arrival to the hospital patient was febrile with temperature of 102.9, tachycardic.  #1. Sepsis due to right lower lobe pneumonia -Blood cultures are negative -IV Rocephin and Zithromax---change to renal dosing oral abxs -Peritoneal fluid cultures negative -Sputum culture rare gram-positive and gram-negative -White count stable. Patient is afebrile.  #2. Right lower lobe pneumonia -cont broad-spectrum antibiotic therapy -cough meds rxed - CT chest results d/w pt. Pt has had nodules which have been stbale since 2016. Some reactive LAD due to infeciton.  -PCP to conisder CT  chest in 3-6 months if s/s worsen  #3. End-stage renal disease, on peritoneal dialysis -Appreciate nephrology input  #4 anemia due to end-stage renal disease, per nephrologist recommendations  Spoke with pt. Overall better. D/c home later today. Pt agreeable.  CONSULTS OBTAINED:  Treatment Team:  Anthonette Legato, MD  DRUG ALLERGIES:   Allergies  Allergen Reactions  . Ciprofloxacin Hcl Swelling    High fever  . Doxycycline Hyclate Swelling  . Phenol-Glycerin Swelling    DISCHARGE MEDICATIONS:   Current Discharge Medication List    START taking these medications   Details  benzonatate (TESSALON) 100 MG capsule Take 1 capsule (100 mg total) by mouth 3 (three) times daily. Qty: 20 capsule, Refills: 0    cefUROXime (CEFTIN) 500 MG tablet Take 1 tablet (500 mg total) by mouth every morning. Qty: 8 tablet, Refills: 0    chlorpheniramine-HYDROcodone (TUSSIONEX) 10-8 MG/5ML SUER Take 5 mLs by mouth every 12 (twelve) hours. Qty: 140 mL, Refills: 0      CONTINUE these medications which have CHANGED   Details  azithromycin (ZITHROMAX) 250 MG tablet Take daily as prescribed Qty: 4 each, Refills: 0      CONTINUE these medications which have NOT CHANGED   Details  atorvastatin (LIPITOR) 20 MG tablet TAKE 1 TABLET BY MOUTH ONCE DAILY Qty: 330 tablet, Refills: 0    cycloSPORINE (RESTASIS) 0.05 % ophthalmic emulsion Place 1 drop into both eyes daily.     Epoetin Alfa (EPOGEN IJ) Inject as directed every 30 (thirty) days. Administered at Dialysis center.   Associated Diagnoses: Amyloidosis, unspecified type ([place])    folic acid (  FOLVITE) 1 MG tablet Take 5 mg by mouth daily.    gentamicin cream (GARAMYCIN) 0.1 % Apply 1 application topically at bedtime.  Refills: 0    losartan (COZAAR) 100 MG tablet Take 1 tablet (100 mg total) by mouth daily. Qty: 90 tablet, Refills: 3    methotrexate (RHEUMATREX) 2.5 MG tablet Take 10 mg by mouth once a week.    pantoprazole  (PROTONIX) 40 MG tablet Take 1 tablet (40 mg total) by mouth 2 (two) times daily before a meal. Qty: 60 tablet, Refills: 0    VELTASSA 8.4 g packet Take 1 packet by mouth daily. Refills: 3    albuterol (PROAIR HFA) 108 (90 Base) MCG/ACT inhaler Inhale into the lungs.    Artificial Tear Solution (SOOTHE XP OP) Apply 1 drop to eye as needed.    Hypromellose (ARTIFICIAL TEARS OP) Apply 1 drop to eye as needed.    ondansetron (ZOFRAN-ODT) 4 MG disintegrating tablet Take 1 tablet (4 mg total) by mouth every 8 (eight) hours as needed for nausea or vomiting. Qty: 30 tablet, Refills: 0      STOP taking these medications     HYDROcodone-homatropine (HYCODAN) 5-1.5 MG/5ML syrup         If you experience worsening of your admission symptoms, develop shortness of breath, life threatening emergency, suicidal or homicidal thoughts you must seek medical attention immediately by calling 911 or calling your MD immediately  if symptoms less severe.  You Must read complete instructions/literature along with all the possible adverse reactions/side effects for all the Medicines you take and that have been prescribed to you. Take any new Medicines after you have completely understood and accept all the possible adverse reactions/side effects.   Please note  You were cared for by a hospitalist during your hospital stay. If you have any questions about your discharge medications or the care you received while you were in the hospital after you are discharged, you can call the unit and asked to speak with the hospitalist on call if the hospitalist that took care of you is not available. Once you are discharged, your primary care physician will handle any further medical issues. Please note that NO REFILLS for any discharge medications will be authorized once you are discharged, as it is imperative that you return to your primary care physician (or establish a relationship with a primary care physician if you do  not have one) for your aftercare needs so that they can reassess your need for medications and monitor your lab values. Today   SUBJECTIVE   Doing well  VITAL SIGNS:  Blood pressure 130/78, pulse 73, temperature 98.4 F (36.9 C), temperature source Oral, resp. rate 19, height 6' (1.829 m), weight 81 kg (178 lb 9.2 oz), SpO2 96 %.  I/O:   Intake/Output Summary (Last 24 hours) at 01/31/17 1005 Last data filed at 01/31/17 0900  Gross per 24 hour  Intake             3276 ml  Output             1602 ml  Net             1674 ml    PHYSICAL EXAMINATION:  GENERAL:  51 y.o.-year-old patient lying in the bed with no acute distress.  EYES: Pupils equal, round, reactive to light and accommodation. No scleral icterus. Extraocular muscles intact.  HEENT: Head atraumatic, normocephalic. Oropharynx and nasopharynx clear.  NECK:  Supple, no jugular venous distention.  No thyroid enlargement, no tenderness.  LUNGS: Normal breath sounds bilaterally, no wheezing, rales,rhonchi or crepitation. No use of accessory muscles of respiration.  CARDIOVASCULAR: S1, S2 normal. No murmurs, rubs, or gallops.  ABDOMEN: Soft, non-tender, non-distended. Bowel sounds present. No organomegaly or mass.  EXTREMITIES: No pedal edema, cyanosis, or clubbing.  NEUROLOGIC: Cranial nerves II through XII are intact. Muscle strength 5/5 in all extremities. Sensation intact. Gait not checked.  PSYCHIATRIC: The patient is alert and oriented x 3.  SKIN: No obvious rash, lesion, or ulcer.   DATA REVIEW:   CBC   Recent Labs Lab 01/30/17 0204  WBC 9.5  HGB 9.2*  HCT 27.6*  PLT 165    Chemistries   Recent Labs Lab 01/29/17 1031 01/30/17 0204  NA 140 138  K 5.1 4.5  CL 105 107  CO2 24 24  GLUCOSE 126* 146*  BUN 56* 57*  CREATININE 5.49* 6.10*  CALCIUM 8.8* 8.3*  AST 33  --   ALT 55  --   ALKPHOS 167*  --   BILITOT 0.7  --     Microbiology Results   Recent Results (from the past 240 hour(s))  Blood  Culture (routine x 2)     Status: None (Preliminary result)   Collection Time: 01/29/17 10:32 AM  Result Value Ref Range Status   Specimen Description BLOOD LEFT AC  Final   Special Requests   Final    BOTTLES DRAWN AEROBIC AND ANAEROBIC Blood Culture adequate volume   Culture NO GROWTH 2 DAYS  Final   Report Status PENDING  Incomplete  Blood Culture (routine x 2)     Status: None (Preliminary result)   Collection Time: 01/29/17 10:33 AM  Result Value Ref Range Status   Specimen Description BLOOD LEFT WRIST  Final   Special Requests   Final    BOTTLES DRAWN AEROBIC AND ANAEROBIC Blood Culture results may not be optimal due to an excessive volume of blood received in culture bottles   Culture NO GROWTH 2 DAYS  Final   Report Status PENDING  Incomplete  Urine culture     Status: Abnormal   Collection Time: 01/29/17  1:00 PM  Result Value Ref Range Status   Specimen Description URINE, RANDOM  Final   Special Requests NONE  Final   Culture (A)  Final    <10,000 COLONIES/mL INSIGNIFICANT GROWTH Performed at [facility], [address], [place], [place] [address]    Report Status 01/30/2017 FINAL  Final  Body fluid culture     Status: None (Preliminary result)   Collection Time: 01/29/17  2:20 PM  Result Value Ref Range Status   Specimen Description DIALYSATE  Final   Special Requests Normal  Final   Gram Stain CYTOSPIN SMEAR NO WBC SEEN NO ORGANISMS SEEN   Final   Culture   Final    NO GROWTH < 24 HOURS Performed at [facility], [place] [address] [place],  [address]    Report Status PENDING  Incomplete  Culture, sputum-assessment     Status: None   Collection Time: 01/29/17  2:47 PM  Result Value Ref Range Status   Specimen Description EXPECTORATED SPUTUM  Final   Special Requests NONE  Final   Sputum evaluation THIS SPECIMEN IS ACCEPTABLE FOR SPUTUM CULTURE  Final   Report Status 01/29/2017 FINAL  Final  Culture, respiratory (NON-Expectorated)     Status:  None (Preliminary result)   Collection Time: 01/29/17  2:47 PM  Result Value Ref Range Status   Specimen Description EXPECTORATED SPUTUM  Final   Special Requests NONE Reflexed from [id number]  Final   Gram Stain   Final    ABUNDANT WBC PRESENT,BOTH PMN AND MONONUCLEAR RARE SQUAMOUS EPITHELIAL CELLS PRESENT RARE GRAM POSITIVE COCCI RARE GRAM POSITIVE RODS Performed at [facility], [place] [address], [place], [place] [address]    Culture PENDING  Incomplete   Report Status PENDING  Incomplete  MRSA PCR Screening     Status: None   Collection Time: 01/29/17  2:48 PM  Result Value Ref Range Status   MRSA by PCR NEGATIVE NEGATIVE Final    Comment:        The GeneXpert MRSA Assay (FDA approved for NASAL specimens only), is one component of a comprehensive MRSA colonization surveillance program. It is not intended to diagnose MRSA infection nor to guide or monitor treatment for MRSA infections.     RADIOLOGY:  Dg Chest 2 View  Result Date: 01/29/2017 CLINICAL DATA:  Cough and fever for 2 days.  Fatigue and headache. EXAM: CHEST  2 VIEW COMPARISON:  07/05/2015 in CT chest 05/14/2015. FINDINGS: Trachea is midline. Heart size normal. Lungs are somewhat low in volume with minimal streaky opacification at the lung bases. There may be developing areas of airspace consolidation at the right lung base. No pleural fluid. IMPRESSION: 1. Possible developing areas of consolidation at the right lung base. Pneumonia cannot be excluded. 2. Bibasilar atelectasis. Electronically Signed   By: Lorin Picket M.D.   On: 01/29/2017 10:50   Ct Chest Wo Contrast  Result Date: 01/30/2017 CLINICAL DATA:  51 year old male with a history of fever and abnormal finding on prior chest x-ray EXAM: CT CHEST WITHOUT CONTRAST TECHNIQUE: Multidetector CT imaging of the chest was performed following the standard protocol without IV contrast. COMPARISON:  Chest x-ray 01/29/2017 FINDINGS: Cardiovascular: Heart size within  normal limits. No pericardial fluid/thickening. No significant coronary calcifications. Unremarkable caliber, course and contour of the thoracic aorta with no significant calcifications. Mediastinum/Nodes: Multiple mediastinal lymph nodes, many of which measure enlarged. Index lymph node in the upper right peritracheal nodal station measures 14 mm. Lowest right-sided peritracheal node measures 16 mm. Multiple subcarinal nodes. Right-sided hilar nodes with the largest measuring 18 mm. While the number and distribution of nodes is unchanged from the comparison CT of 05/14/2015, size has increased on the index lymph nodes. No axillary lymphadenopathy. Lungs/Pleura: Finding on the prior chest x-ray corresponds to mixed ground-glass and peribronchovascular nodular opacities of the right lower lobe, with associated endobronchial debris. Trace right-sided pleural effusion. No significant airspace disease of the left lung with minimal atelectasis. Upper Abdomen: Embolic material partially imaged within left renal vasculature. Cholelithiasis. Low-density ascites of the upper abdomen. Musculoskeletal: Soft tissue nodules of posterior intercostal location on both right and the left. Overall, the soft tissue nodules have not changed in number from the CT of 05/14/2015, although are slightly more prominent. IMPRESSION: Right lower lobe pneumonia with associated mediastinal adenopathy. There are several focal soft tissue nodules in the subpleural space of the bilateral posterior intercostal spaces. These have been present since the comparison CT of 05/14/2015, though perhaps slightly more prominent on today's study. Etiology uncertain, with differential diagnosis including lymph nodes, multiple nerve sheath tumors, or possibly foci of extra medullary hematopoiesis in this patient with a history of amyloidosis. If there is concern for lymphoproliferative disorder, correlation with lab values may be useful. Trace ascites of the  upper abdomen. Electronically  Signed   By: Corrie Mckusick D.O.   On: 01/30/2017 13:01     Management plans discussed with the patient, family and they are in agreement.  CODE STATUS:     Code Status Orders        Start     Ordered   01/29/17 1419  Full code  Continuous     01/29/17 1419    Code Status History    Date Active Date Inactive Code Status Order ID Comments User Context   05/31/2015  9:59 AM 06/01/2015  3:31 AM Full Code [id number]  Sabino Dick, MD [place]   04/06/2015 12:39 PM 04/21/2015  1:45 PM Full Code [id number]  Anthonette Legato, MD Inpatient      TOTAL TIME TAKING CARE OF THIS PATIENT: 40 minutes.    Dinisha Cai M.D on 01/31/2017 at 10:05 AM  Between 7am to 6pm - Pager - [phone] After 6pm go to [email or web address] - password [email or web address] [facility]  Office  [phone]  CC: Primary care physician; Joseph Rossetti, MD

## 2017-01-31 NOTE — Progress Notes (Signed)
PD complete w/ no complications. Pt alert, no c/o. Total UF 1645ml. Lateef MD aware.

## 2017-01-31 NOTE — Progress Notes (Signed)
Discharge order received. Patient is alert and oriented. Vital signs stable . No signs of acute distress. Discharge instructions given. Patient verbalized understanding. No other issues noted at this time.   

## 2017-01-31 NOTE — Discharge Instructions (Signed)
Cont your PD as before

## 2017-01-31 NOTE — Care Management Important Message (Signed)
Important Message  Patient Details  Name: Joseph [name] MRN: [id number] Date of Birth: 1965/07/25   Medicare Important Message Given:  N/A - LOS <3 / Initial given by admissions    Beverly Sessions, RN 01/31/2017, 11:09 AM

## 2017-01-31 NOTE — Care Management (Signed)
Discharge summary faxed to [facility] dialysis liaison

## 2017-01-31 NOTE — Progress Notes (Signed)
PD END

## 2017-01-31 NOTE — Progress Notes (Signed)
[name] at [place] NAME: Joseph [name]    MR#:  [id number]  DATE OF BIRTH:  1965/11/29  SUBJECTIVE:  Cough and chest discomfort with coughing  REVIEW OF SYSTEMS:   Review of Systems  Constitutional: Negative for chills, fever and weight loss.  HENT: Negative for ear discharge, ear pain and nosebleeds.   Eyes: Negative for blurred vision, pain and discharge.  Respiratory: Positive for cough. Negative for sputum production, wheezing and stridor.   Cardiovascular: Negative for chest pain, palpitations, orthopnea and PND.  Gastrointestinal: Negative for abdominal pain, diarrhea, nausea and vomiting.  Genitourinary: Negative for frequency and urgency.  Musculoskeletal: Negative for back pain and joint pain.  Neurological: Positive for weakness. Negative for sensory change, speech change and focal weakness.  Psychiatric/Behavioral: Negative for depression and hallucinations. The patient is not nervous/anxious.    Tolerating Diet:yes Tolerating PT: not needed  DRUG ALLERGIES:   Allergies  Allergen Reactions  . Ciprofloxacin Hcl Swelling    High fever  . Doxycycline Hyclate Swelling  . Phenol-Glycerin Swelling    VITALS:  Blood pressure 130/78, pulse 73, temperature 98.4 F (36.9 C), temperature source Oral, resp. rate 19, height 6' (1.829 m), weight 81 kg (178 lb 9.2 oz), SpO2 96 %.  PHYSICAL EXAMINATION:   Physical Exam  GENERAL:  51 y.o.-year-old patient lying in the bed with no acute distress.  EYES: Pupils equal, round, reactive to light and accommodation. No scleral icterus. Extraocular muscles intact.  HEENT: Head atraumatic, normocephalic. Oropharynx and nasopharynx clear.  NECK:  Supple, no jugular venous distention. No thyroid enlargement, no tenderness.  LUNGS: Normal breath sounds bilaterally, no wheezing, rales, rhonchi. No use of accessory muscles of respiration.  CARDIOVASCULAR: S1, S2 normal. No murmurs, rubs,  or gallops.  ABDOMEN: Soft, nontender, nondistended. Bowel sounds present. No organomegaly or mass.  EXTREMITIES: No cyanosis, clubbing or edema b/l.    NEUROLOGIC: Cranial nerves II through XII are intact. No focal Motor or sensory deficits b/l.   PSYCHIATRIC:  patient is alert and oriented x 3.  SKIN: No obvious rash, lesion, or ulcer.   LABORATORY PANEL:  CBC  Recent Labs Lab 01/30/17 0204  WBC 9.5  HGB 9.2*  HCT 27.6*  PLT 165    Chemistries   Recent Labs Lab 01/29/17 1031 01/30/17 0204  NA 140 138  K 5.1 4.5  CL 105 107  CO2 24 24  GLUCOSE 126* 146*  BUN 56* 57*  CREATININE 5.49* 6.10*  CALCIUM 8.8* 8.3*  AST 33  --   ALT 55  --   ALKPHOS 167*  --   BILITOT 0.7  --    Cardiac Enzymes  Recent Labs Lab 01/30/17 0204  TROPONINI 0.04*   RADIOLOGY:  Dg Chest 2 View  Result Date: 01/29/2017 CLINICAL DATA:  Cough and fever for 2 days.  Fatigue and headache. EXAM: CHEST  2 VIEW COMPARISON:  07/05/2015 in CT chest 05/14/2015. FINDINGS: Trachea is midline. Heart size normal. Lungs are somewhat low in volume with minimal streaky opacification at the lung bases. There may be developing areas of airspace consolidation at the right lung base. No pleural fluid. IMPRESSION: 1. Possible developing areas of consolidation at the right lung base. Pneumonia cannot be excluded. 2. Bibasilar atelectasis. Electronically Signed   By: Lorin Picket M.D.   On: 01/29/2017 10:50   Ct Chest Wo Contrast  Result Date: 01/30/2017 CLINICAL DATA:  51 year old male with a history of fever and abnormal  finding on prior chest x-ray EXAM: CT CHEST WITHOUT CONTRAST TECHNIQUE: Multidetector CT imaging of the chest was performed following the standard protocol without IV contrast. COMPARISON:  Chest x-ray 01/29/2017 FINDINGS: Cardiovascular: Heart size within normal limits. No pericardial fluid/thickening. No significant coronary calcifications. Unremarkable caliber, course and contour of the thoracic  aorta with no significant calcifications. Mediastinum/Nodes: Multiple mediastinal lymph nodes, many of which measure enlarged. Index lymph node in the upper right peritracheal nodal station measures 14 mm. Lowest right-sided peritracheal node measures 16 mm. Multiple subcarinal nodes. Right-sided hilar nodes with the largest measuring 18 mm. While the number and distribution of nodes is unchanged from the comparison CT of 05/14/2015, size has increased on the index lymph nodes. No axillary lymphadenopathy. Lungs/Pleura: Finding on the prior chest x-ray corresponds to mixed ground-glass and peribronchovascular nodular opacities of the right lower lobe, with associated endobronchial debris. Trace right-sided pleural effusion. No significant airspace disease of the left lung with minimal atelectasis. Upper Abdomen: Embolic material partially imaged within left renal vasculature. Cholelithiasis. Low-density ascites of the upper abdomen. Musculoskeletal: Soft tissue nodules of posterior intercostal location on both right and the left. Overall, the soft tissue nodules have not changed in number from the CT of 05/14/2015, although are slightly more prominent. IMPRESSION: Right lower lobe pneumonia with associated mediastinal adenopathy. There are several focal soft tissue nodules in the subpleural space of the bilateral posterior intercostal spaces. These have been present since the comparison CT of 05/14/2015, though perhaps slightly more prominent on today's study. Etiology uncertain, with differential diagnosis including lymph nodes, multiple nerve sheath tumors, or possibly foci of extra medullary hematopoiesis in this patient with a history of amyloidosis. If there is concern for lymphoproliferative disorder, correlation with lab values may be useful. Trace ascites of the upper abdomen. Electronically Signed   By: Corrie Mckusick D.O.   On: 01/30/2017 13:01   ASSESSMENT AND PLAN:  : Joseph [name]  is a 51 y.o. male  with a known history of Amyloidosis, end-stage renal disease on peritoneal dialysis, dry eyes, hypertension, hyperlipidemia, who presents to the hospital with complaints of high fevers which started about 2 days ago, cough, light green phlegm, wheezing, shortness of breath, headaches. On arrival to the hospital patient was febrile with temperature of 102.9, tachycardic.  #1. Sepsis due to right lower lobe pneumonia -Blood cultures are negative -IV Rocephin and Zithromax -Peritoneal fluid cultures negative -Sputum culture rare gram-positive and gram-negative -White count stable. Patient is afebrile.  #2. Right lower lobe pneumonia -cont broad-spectrum antibiotic therapy, get sputum cultures  #3. End-stage renal disease, on peritoneal dialysis -Appreciate nephrology input  #4 anemia due to end-stage renal disease, per nephrologist recommendations  Spoke with wife and patient.   Case discussed with Care Management/Social Worker. Management plans discussed with the patient, family and they are in agreement.  CODE STATUS: Full  DVT Prophylaxis: SCD teds. Patient declining heparin  TOTAL TIME TAKING CARE OF THIS PATIENT: 30 minutes.  >50% time spent on counselling and coordination of care  POSSIBLE D/C IN *1-2 DAYS, DEPENDING ON CLINICAL CONDITION.  Note: This dictation was prepared with Dragon dictation along with smaller phrase technology. Any transcriptional errors that result from this process are unintentional.  Jonnie Truxillo M.D on 01/31/2017 at 10:10 AM  Between 7am to 6pm - Pager - [phone]  After 6pm go to [email or web address] - password [email or web address] [facility]  Office  [phone]  CC: Primary care physician; Alycia Rossetti, MD

## 2017-02-01 DIAGNOSIS — N186 End stage renal disease: Secondary | ICD-10-CM | POA: Diagnosis not present

## 2017-02-01 DIAGNOSIS — Z992 Dependence on renal dialysis: Secondary | ICD-10-CM | POA: Diagnosis not present

## 2017-02-01 LAB — CULTURE, RESPIRATORY W GRAM STAIN: Culture: NORMAL

## 2017-02-01 LAB — HEPATITIS B SURFACE ANTIGEN: Hepatitis B Surface Ag: NEGATIVE

## 2017-02-01 LAB — CULTURE, RESPIRATORY

## 2017-02-01 LAB — HEPATITIS B SURFACE ANTIBODY,QUALITATIVE: HEP B S AB: NONREACTIVE

## 2017-02-02 ENCOUNTER — Other Ambulatory Visit: Payer: Self-pay | Admitting: *Deleted

## 2017-02-02 ENCOUNTER — Encounter: Payer: Self-pay | Admitting: *Deleted

## 2017-02-02 DIAGNOSIS — N186 End stage renal disease: Secondary | ICD-10-CM | POA: Diagnosis not present

## 2017-02-02 DIAGNOSIS — Z992 Dependence on renal dialysis: Secondary | ICD-10-CM | POA: Diagnosis not present

## 2017-02-02 LAB — BODY FLUID CULTURE
Culture: NO GROWTH
SPECIAL REQUESTS: NORMAL

## 2017-02-02 NOTE — Patient Outreach (Signed)
[place] [facility]) Care Management  02/02/2017  Ekin Pilar 1966-06-17 [id number]  Subjective: Telephone call to patient's home number, spoke with patient, and HIPAA verified.  Discussed [facility] Care Management UMR Transition of care follow up, patient voiced understanding, and is in agreement to follow up.   Patient states he is doing well, has a follow up appointment with primary MD on 02/12/17, has a supportive family, and mother is assisting with activities of daily living as needed. Patient voices understanding of medical diagnosis and treatment plan.  States he is accessing the following [facility] benefits: outpatient pharmacy, hospital indemnity (wife will follow up with [facility] benefit specialist to verify if benefit chosen and file claim if appropriate), and wife has family medical leave act (FMLA) in place. Patient states he no longer works and does not needed [employer].   Patient states she does not have any transition of care, care coordination, disease management, disease monitoring, transportation, community resource, or pharmacy needs at this time. Patient states he does not have any education material, transition of care, care coordination, disease management, disease monitoring, transportation, community resource, or pharmacy needs at this time. States he is very appreciative of the follow up and is in agreement to receive [facility] Management information.    Objective: Per chart review, patient hospitalized  01/29/17 - 01/31/17 for sepsis due to right lower lobe pneumonia.   Patient also has a history of: Amyloidosis, end-stage renal disease on peritoneal dialysis, dry eyes, hypertension, hyperlipidemia, and anemia.   Assessment: Received UMR Transition of care referral on 01/31/17.  Transition of care follow up completed, no care management needs, and will proceed with case closure.    Plan: RNCM will send patient successful outreach letter, [facility] pamphlet, and magnet. RNCM will send case  closure due to follow up completed / no care management needs request to [facility].    Momoko Slezak H. Annia Friendly, BSN, [place] Management [facility] Telephonic CM Phone: [phone] Fax: [phone]

## 2017-02-03 DIAGNOSIS — N186 End stage renal disease: Secondary | ICD-10-CM | POA: Diagnosis not present

## 2017-02-03 DIAGNOSIS — Z992 Dependence on renal dialysis: Secondary | ICD-10-CM | POA: Diagnosis not present

## 2017-02-03 LAB — CULTURE, BLOOD (ROUTINE X 2)
CULTURE: NO GROWTH
Culture: NO GROWTH
Special Requests: ADEQUATE

## 2017-02-04 DIAGNOSIS — Z992 Dependence on renal dialysis: Secondary | ICD-10-CM | POA: Diagnosis not present

## 2017-02-04 DIAGNOSIS — N186 End stage renal disease: Secondary | ICD-10-CM | POA: Diagnosis not present

## 2017-02-05 DIAGNOSIS — N186 End stage renal disease: Secondary | ICD-10-CM | POA: Diagnosis not present

## 2017-02-05 DIAGNOSIS — Z992 Dependence on renal dialysis: Secondary | ICD-10-CM | POA: Diagnosis not present

## 2017-02-06 DIAGNOSIS — N186 End stage renal disease: Secondary | ICD-10-CM | POA: Diagnosis not present

## 2017-02-06 DIAGNOSIS — Z992 Dependence on renal dialysis: Secondary | ICD-10-CM | POA: Diagnosis not present

## 2017-02-07 DIAGNOSIS — Z992 Dependence on renal dialysis: Secondary | ICD-10-CM | POA: Diagnosis not present

## 2017-02-07 DIAGNOSIS — N186 End stage renal disease: Secondary | ICD-10-CM | POA: Diagnosis not present

## 2017-02-08 DIAGNOSIS — Z992 Dependence on renal dialysis: Secondary | ICD-10-CM | POA: Diagnosis not present

## 2017-02-08 DIAGNOSIS — N186 End stage renal disease: Secondary | ICD-10-CM | POA: Diagnosis not present

## 2017-02-09 DIAGNOSIS — N186 End stage renal disease: Secondary | ICD-10-CM | POA: Diagnosis not present

## 2017-02-09 DIAGNOSIS — Z992 Dependence on renal dialysis: Secondary | ICD-10-CM | POA: Diagnosis not present

## 2017-02-10 DIAGNOSIS — N186 End stage renal disease: Secondary | ICD-10-CM | POA: Diagnosis not present

## 2017-02-10 DIAGNOSIS — Z992 Dependence on renal dialysis: Secondary | ICD-10-CM | POA: Diagnosis not present

## 2017-02-11 DIAGNOSIS — N186 End stage renal disease: Secondary | ICD-10-CM | POA: Diagnosis not present

## 2017-02-11 DIAGNOSIS — Z992 Dependence on renal dialysis: Secondary | ICD-10-CM | POA: Diagnosis not present

## 2017-02-12 ENCOUNTER — Ambulatory Visit (INDEPENDENT_AMBULATORY_CARE_PROVIDER_SITE_OTHER): Payer: 59 | Admitting: Family Medicine

## 2017-02-12 ENCOUNTER — Encounter: Payer: Self-pay | Admitting: Family Medicine

## 2017-02-12 VITALS — BP 116/82 | HR 98 | Temp 98.0°F | Resp 14 | Ht 71.0 in | Wt 183.0 lb

## 2017-02-12 DIAGNOSIS — J181 Lobar pneumonia, unspecified organism: Secondary | ICD-10-CM | POA: Diagnosis not present

## 2017-02-12 DIAGNOSIS — R918 Other nonspecific abnormal finding of lung field: Secondary | ICD-10-CM | POA: Diagnosis not present

## 2017-02-12 DIAGNOSIS — E854 Organ-limited amyloidosis: Secondary | ICD-10-CM | POA: Diagnosis not present

## 2017-02-12 DIAGNOSIS — N186 End stage renal disease: Secondary | ICD-10-CM | POA: Diagnosis not present

## 2017-02-12 DIAGNOSIS — Z992 Dependence on renal dialysis: Secondary | ICD-10-CM | POA: Diagnosis not present

## 2017-02-12 DIAGNOSIS — I10 Essential (primary) hypertension: Secondary | ICD-10-CM

## 2017-02-12 DIAGNOSIS — N08 Glomerular disorders in diseases classified elsewhere: Secondary | ICD-10-CM

## 2017-02-12 DIAGNOSIS — J189 Pneumonia, unspecified organism: Secondary | ICD-10-CM

## 2017-02-12 DIAGNOSIS — N289 Disorder of kidney and ureter, unspecified: Secondary | ICD-10-CM | POA: Diagnosis not present

## 2017-02-12 MED ORDER — HYDROCOD POLST-CPM POLST ER 10-8 MG/5ML PO SUER: 5.0000 mL | Freq: Two times a day (BID) | ORAL | 0 refills | Status: DC

## 2017-02-12 NOTE — Assessment & Plan Note (Signed)
Lung exam is clear. He has continued cough which is normal after having pneumonia. This will be the last thing to resolve. He has not had any further fevers his work of breathing besides during coughing is normal. Have him get a chest x-ray in one week we will plan to repeat his CT scan of his chest for lung nodules in 3 months  With regards to his chronic anemia he did have repeat labs with his dialysis center and he was also given Eogen.  He is still on transplant list for ESRD

## 2017-02-12 NOTE — Patient Instructions (Addendum)
Get the chest xray in 1 week at [facility] CT of chest to be done in 3 months November  F/U 4 months

## 2017-02-12 NOTE — Progress Notes (Signed)
Subjective:    Patient ID: Joseph [name], male    DOB: December 25, 1965, 51 y.o.   MRN: [id number]  Patient presents for Hospital F/U (PNA- non-productive cough  continues)  Patient here for hospital follow-up. He was admitted to the hospital secondary to fever/sepsis in the setting of community acquired pneumonia initially thought to have. Tinnitus as well however his cultures came back negative. He was treated for right lower lobe pneumonia with Rocephin and azithromycin and then changed to oral Ceftin/azithromycin at discharge. His blood cultures were negative sputum culture showed rare gram-positive and gram-negative species. He had CT of chest performed which did show lung nodules which are stable since 2016 he did have some reactive lymphadenopathy was recommended that this be repeated in 3 months.  End-stage renal disease there's no change in his peritoneal dialysis as his peritoneal fluid cultures were negative.He is still awaiting transplant for his Amyloidosis ESRD  He has chronic anemia in the setting of his renal disease his hemoglobin at discharge was 9.2 platelets were normal. He is followed by hematology as well.   [place] labs 1 week ago Hb was up to 9.7    Chronic anemia unchanged.  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)  10mg  daily   His weight at discharge was 178 pounds, dry weight 82lg   Review of his labs from the hospital he also had mildly elevated glucoses A1c was 5.8%  Review Of Systems:  GEN- denies fatigue, fever, weight loss,weakness, recent illness HEENT- denies eye drainage, change in vision, nasal discharge, CVS- denies chest pain, palpitations RESP- denies SOB,+ cough, wheeze ABD- denies N/V, change in stools, abd pain GU- denies dysuria, hematuria, dribbling, incontinence MSK- denies joint pain, muscle aches, injury Neuro-  denies headache, dizziness, syncope, seizure activity       Objective:    BP 116/82   Pulse 98   Temp 98 F (36.7 C) (Oral)   Resp 14   Ht 5\' 11"  (1.803 m)   Wt 183 lb (83 kg)   SpO2 96%   BMI 25.52 kg/m  GEN- NAD, alert and oriented x3 HEENT- PERRL, EOMI, non injected sclera, pink conjunctiva, MMM, oropharynx clear Neck- Supple, no LAD  CVS- RRR, no murmur RESP-CTAB, coughing, no wheeze, normal WOB  ABD-NABS,soft,NT,ND,dialysis port in tact d/c/i  EXT- No edema Pulses- Radial, DP- 2+        Assessment & Plan:      Problem List Items Addressed This Visit      Unprioritized   ESRD on peritoneal dialysis ([place])   Renal amyloidosis ([place])   Right lower lobe pneumonia ([place]) - Primary    Lung exam is clear. He has continued cough which is normal after having pneumonia. This will be the last thing to resolve. He has not had any further fevers his work of breathing besides during coughing is normal. Have him get a chest x-ray in one week we will plan to repeat his CT scan of his chest for lung nodules in 3 months  With regards to his chronic anemia he did have repeat labs with his dialysis center and he was also given Eogen.  He is still on transplant list for ESRD      Relevant Medications   chlorpheniramine-HYDROcodone (TUSSIONEX) 10-8 MG/5ML SUER   Other Relevant Orders   DG Chest 2 View   Essential hypertension    Controlled on  50mg  daily         Note: This dictation was prepared with Dragon dictation along with smaller phrase technology. Any transcriptional errors that result from this process are unintentional.

## 2017-02-12 NOTE — Assessment & Plan Note (Signed)
Controlled on 50mg  daily

## 2017-02-13 DIAGNOSIS — N186 End stage renal disease: Secondary | ICD-10-CM | POA: Diagnosis not present

## 2017-02-13 DIAGNOSIS — Z992 Dependence on renal dialysis: Secondary | ICD-10-CM | POA: Diagnosis not present

## 2017-02-14 DIAGNOSIS — N186 End stage renal disease: Secondary | ICD-10-CM | POA: Diagnosis not present

## 2017-02-14 DIAGNOSIS — Z992 Dependence on renal dialysis: Secondary | ICD-10-CM | POA: Diagnosis not present

## 2017-02-15 DIAGNOSIS — N186 End stage renal disease: Secondary | ICD-10-CM | POA: Diagnosis not present

## 2017-02-15 DIAGNOSIS — Z992 Dependence on renal dialysis: Secondary | ICD-10-CM | POA: Diagnosis not present

## 2017-02-16 DIAGNOSIS — Z992 Dependence on renal dialysis: Secondary | ICD-10-CM | POA: Diagnosis not present

## 2017-02-16 DIAGNOSIS — N186 End stage renal disease: Secondary | ICD-10-CM | POA: Diagnosis not present

## 2017-02-17 DIAGNOSIS — Z992 Dependence on renal dialysis: Secondary | ICD-10-CM | POA: Diagnosis not present

## 2017-02-17 DIAGNOSIS — N186 End stage renal disease: Secondary | ICD-10-CM | POA: Diagnosis not present

## 2017-02-18 DIAGNOSIS — N186 End stage renal disease: Secondary | ICD-10-CM | POA: Diagnosis not present

## 2017-02-18 DIAGNOSIS — Z992 Dependence on renal dialysis: Secondary | ICD-10-CM | POA: Diagnosis not present

## 2017-02-19 DIAGNOSIS — Z992 Dependence on renal dialysis: Secondary | ICD-10-CM | POA: Diagnosis not present

## 2017-02-19 DIAGNOSIS — N186 End stage renal disease: Secondary | ICD-10-CM | POA: Diagnosis not present

## 2017-02-20 DIAGNOSIS — Z992 Dependence on renal dialysis: Secondary | ICD-10-CM | POA: Diagnosis not present

## 2017-02-20 DIAGNOSIS — N186 End stage renal disease: Secondary | ICD-10-CM | POA: Diagnosis not present

## 2017-02-21 ENCOUNTER — Telehealth: Payer: Self-pay | Admitting: *Deleted

## 2017-02-21 DIAGNOSIS — Z992 Dependence on renal dialysis: Secondary | ICD-10-CM | POA: Diagnosis not present

## 2017-02-21 DIAGNOSIS — N186 End stage renal disease: Secondary | ICD-10-CM | POA: Diagnosis not present

## 2017-02-21 NOTE — Telephone Encounter (Signed)
Received call from [facility] Radiology.   Patient scheduled for CT on 02/22/2017 to F/U lung nodules. States that current order is not recommended to use for F/U. States that order should just read CT Chest. Radiology department will correct for MD to co-sign.   Also reports that patient noted to have had CT of chest in August and wanted to verify that patient requires CT so soon. Per chart notes, MD recommended 3 month F/U of nodules. (He had CT of chest performed which did show lung nodules which are stable since 2016 he did have some reactive lymphadenopathy was recommended that this be repeated in 3 months.) CT re-scheduled at [facility] on 05/02/2017 @ 8am. Call placed to patient and patient and patient wife, Claudine made aware.   MD to be made aware.

## 2017-02-22 ENCOUNTER — Ambulatory Visit: Admission: RE | Admit: 2017-02-22 | Payer: 59 | Source: Ambulatory Visit

## 2017-02-22 DIAGNOSIS — N186 End stage renal disease: Secondary | ICD-10-CM | POA: Diagnosis not present

## 2017-02-22 DIAGNOSIS — Z992 Dependence on renal dialysis: Secondary | ICD-10-CM | POA: Diagnosis not present

## 2017-02-23 DIAGNOSIS — Z992 Dependence on renal dialysis: Secondary | ICD-10-CM | POA: Diagnosis not present

## 2017-02-23 DIAGNOSIS — N186 End stage renal disease: Secondary | ICD-10-CM | POA: Diagnosis not present

## 2017-02-24 DIAGNOSIS — Z23 Encounter for immunization: Secondary | ICD-10-CM | POA: Diagnosis not present

## 2017-02-24 DIAGNOSIS — Z992 Dependence on renal dialysis: Secondary | ICD-10-CM | POA: Diagnosis not present

## 2017-02-24 DIAGNOSIS — N186 End stage renal disease: Secondary | ICD-10-CM | POA: Diagnosis not present

## 2017-02-25 DIAGNOSIS — N186 End stage renal disease: Secondary | ICD-10-CM | POA: Diagnosis not present

## 2017-02-25 DIAGNOSIS — Z992 Dependence on renal dialysis: Secondary | ICD-10-CM | POA: Diagnosis not present

## 2017-02-25 DIAGNOSIS — Z23 Encounter for immunization: Secondary | ICD-10-CM | POA: Diagnosis not present

## 2017-02-26 DIAGNOSIS — Z992 Dependence on renal dialysis: Secondary | ICD-10-CM | POA: Diagnosis not present

## 2017-02-26 DIAGNOSIS — N186 End stage renal disease: Secondary | ICD-10-CM | POA: Diagnosis not present

## 2017-02-26 DIAGNOSIS — Z23 Encounter for immunization: Secondary | ICD-10-CM | POA: Diagnosis not present

## 2017-02-27 DIAGNOSIS — N186 End stage renal disease: Secondary | ICD-10-CM | POA: Diagnosis not present

## 2017-02-27 DIAGNOSIS — Z992 Dependence on renal dialysis: Secondary | ICD-10-CM | POA: Diagnosis not present

## 2017-02-27 DIAGNOSIS — Z23 Encounter for immunization: Secondary | ICD-10-CM | POA: Diagnosis not present

## 2017-02-28 DIAGNOSIS — Z992 Dependence on renal dialysis: Secondary | ICD-10-CM | POA: Diagnosis not present

## 2017-02-28 DIAGNOSIS — Z23 Encounter for immunization: Secondary | ICD-10-CM | POA: Diagnosis not present

## 2017-02-28 DIAGNOSIS — N186 End stage renal disease: Secondary | ICD-10-CM | POA: Diagnosis not present

## 2017-03-01 DIAGNOSIS — N186 End stage renal disease: Secondary | ICD-10-CM | POA: Diagnosis not present

## 2017-03-01 DIAGNOSIS — Z23 Encounter for immunization: Secondary | ICD-10-CM | POA: Diagnosis not present

## 2017-03-01 DIAGNOSIS — Z992 Dependence on renal dialysis: Secondary | ICD-10-CM | POA: Diagnosis not present

## 2017-03-02 DIAGNOSIS — Z992 Dependence on renal dialysis: Secondary | ICD-10-CM | POA: Diagnosis not present

## 2017-03-02 DIAGNOSIS — Z79899 Other long term (current) drug therapy: Secondary | ICD-10-CM | POA: Diagnosis not present

## 2017-03-02 DIAGNOSIS — N186 End stage renal disease: Secondary | ICD-10-CM | POA: Diagnosis not present

## 2017-03-02 DIAGNOSIS — Z23 Encounter for immunization: Secondary | ICD-10-CM | POA: Diagnosis not present

## 2017-03-03 DIAGNOSIS — Z992 Dependence on renal dialysis: Secondary | ICD-10-CM | POA: Diagnosis not present

## 2017-03-03 DIAGNOSIS — N186 End stage renal disease: Secondary | ICD-10-CM | POA: Diagnosis not present

## 2017-03-03 IMAGING — CR DG CHEST 1V PORT
1 series · 1 of 1 positions shown · non-contrast
Comparison: Portable exam 4624 hours compared to 04/15/2015

CLINICAL DATA: Post thoracentesis, LEFT pleural effusion, cough

EXAM:
PORTABLE CHEST 1 VIEW

[ap]
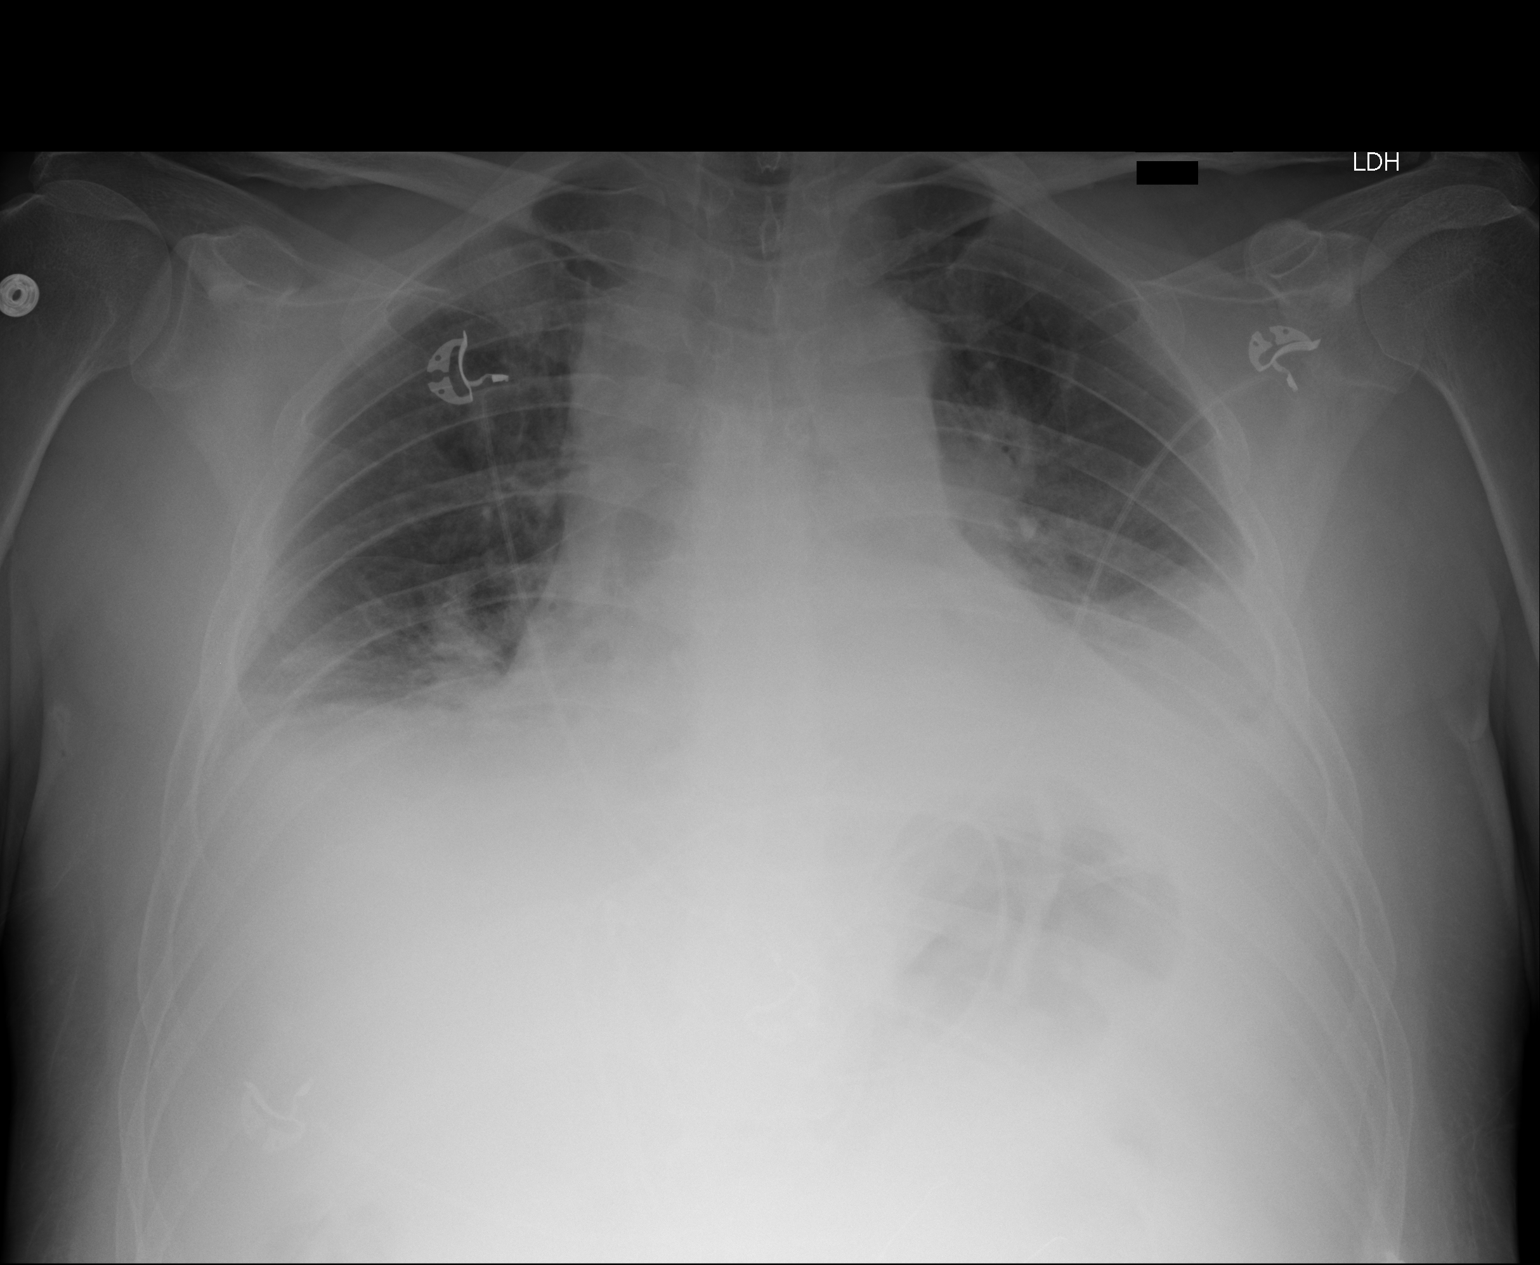

[1 of 1 positions shown; findings below may reference images not displayed]

FINDINGS: Enlargement of cardiac silhouette.

Prominent mediastinum unchanged.

Low lung volumes with bibasilar effusions and atelectasis.

No pneumothorax.

Bones unremarkable.
IMPRESSION: No pneumothorax post thoracentesis.

Enlargement of cardiac silhouette with low lung volumes, bibasilar
pleural effusions and bibasilar atelectasis.

## 2017-03-03 IMAGING — CR DG CHEST 2V
1 series · 2 of 2 positions shown · non-contrast
Comparison: Prior chest x-ray 04/14/2015

CLINICAL DATA: 49-year-old male status post left-sided
thoracentesis

EXAM:
CHEST  2 VIEW

[Series 1: dg chest 2 view · 0.14mm/px · 2 of 2 slices shown]
[im 1/2]
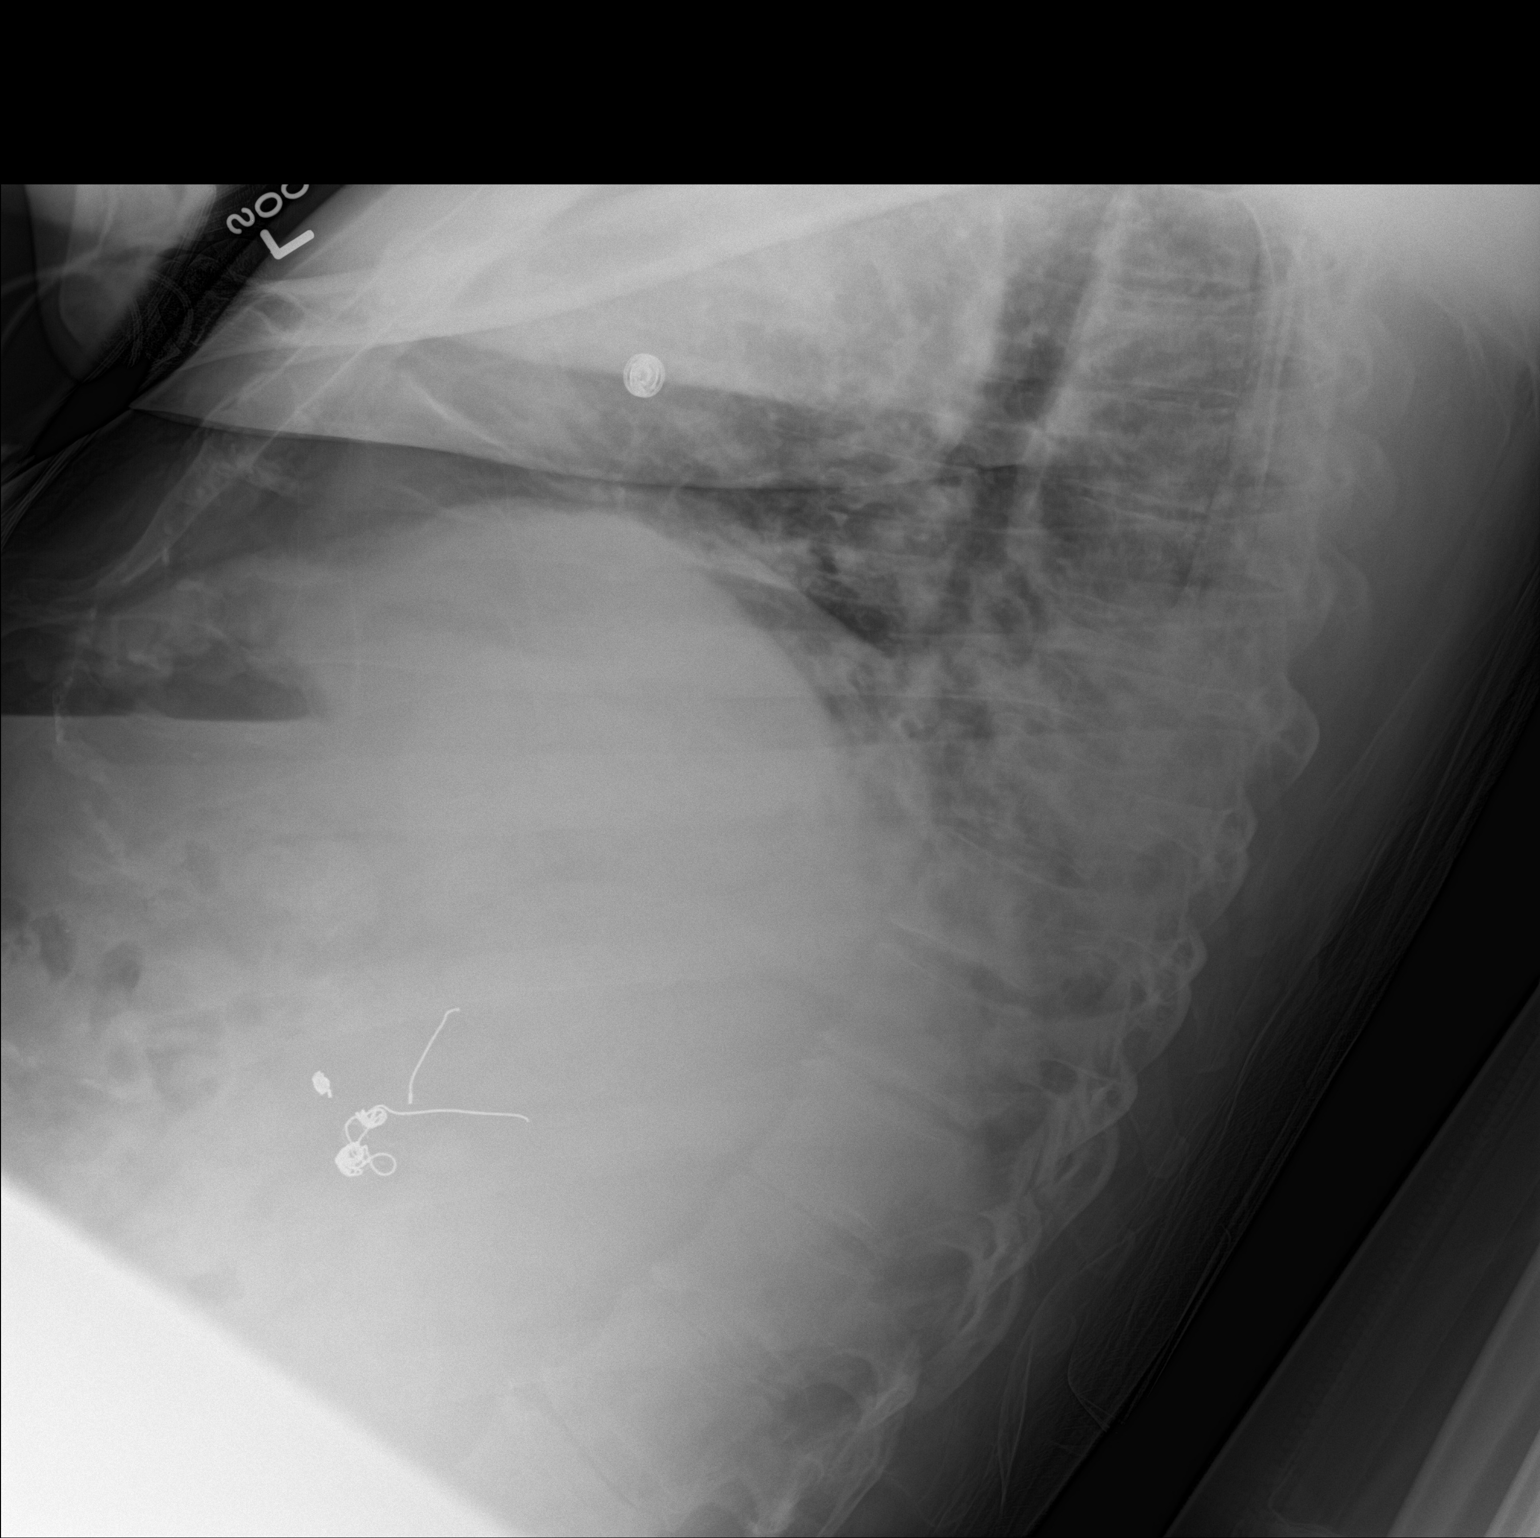
[im 2/2]
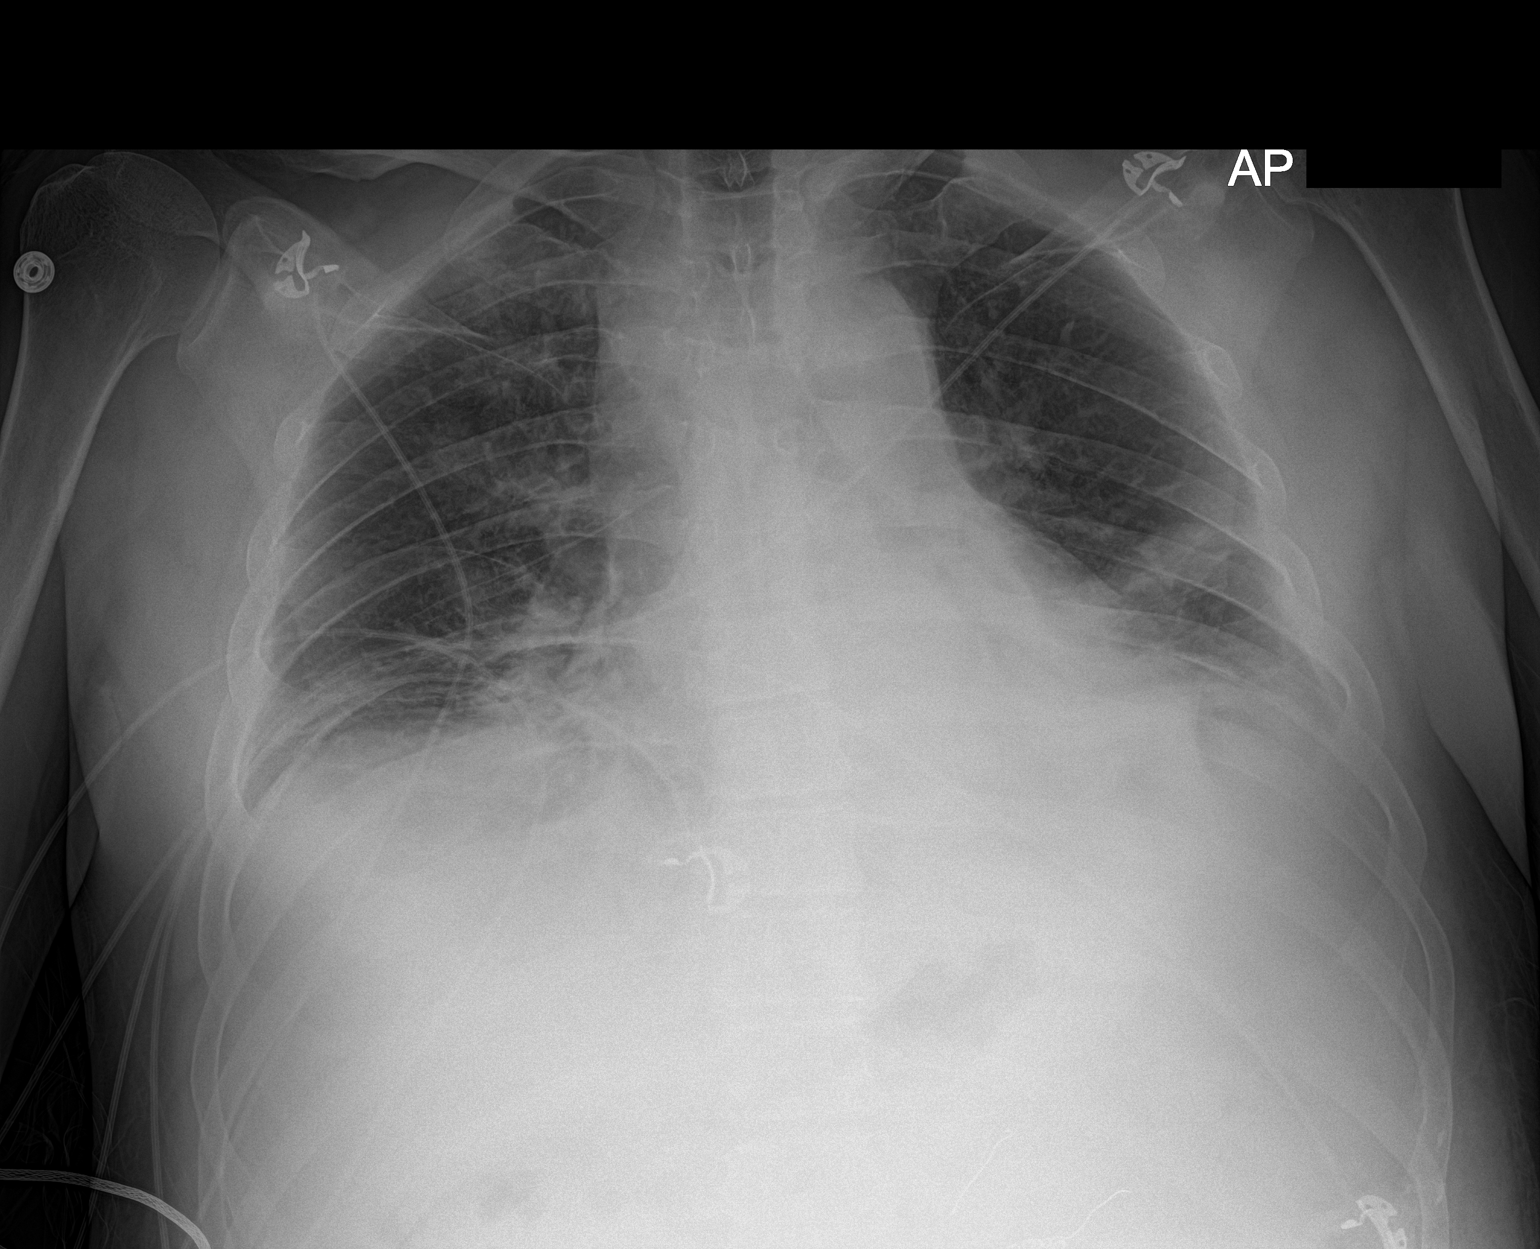

[2 of 2 positions shown; findings below may reference images not displayed]

FINDINGS: No evidence of pneumothorax. Reduced left-sided pleural effusion.
Persistent low lung volumes with bibasilar atelectasis. No acute
osseous abnormality.
IMPRESSION: No evidence of pneumothorax following left thoracentesis.

Near-total resolution of left pleural effusion.

Persistent low inspiratory volumes with bibasilar atelectasis.

## 2017-03-04 DIAGNOSIS — Z992 Dependence on renal dialysis: Secondary | ICD-10-CM | POA: Diagnosis not present

## 2017-03-04 DIAGNOSIS — N186 End stage renal disease: Secondary | ICD-10-CM | POA: Diagnosis not present

## 2017-03-05 DIAGNOSIS — Z992 Dependence on renal dialysis: Secondary | ICD-10-CM | POA: Diagnosis not present

## 2017-03-05 DIAGNOSIS — N186 End stage renal disease: Secondary | ICD-10-CM | POA: Diagnosis not present

## 2017-03-06 DIAGNOSIS — N186 End stage renal disease: Secondary | ICD-10-CM | POA: Diagnosis not present

## 2017-03-06 DIAGNOSIS — Z992 Dependence on renal dialysis: Secondary | ICD-10-CM | POA: Diagnosis not present

## 2017-03-07 DIAGNOSIS — H0289 Other specified disorders of eyelid: Secondary | ICD-10-CM | POA: Diagnosis not present

## 2017-03-07 DIAGNOSIS — H2513 Age-related nuclear cataract, bilateral: Secondary | ICD-10-CM | POA: Diagnosis not present

## 2017-03-07 DIAGNOSIS — H04123 Dry eye syndrome of bilateral lacrimal glands: Secondary | ICD-10-CM | POA: Diagnosis not present

## 2017-03-07 DIAGNOSIS — N186 End stage renal disease: Secondary | ICD-10-CM | POA: Diagnosis not present

## 2017-03-07 DIAGNOSIS — Z992 Dependence on renal dialysis: Secondary | ICD-10-CM | POA: Diagnosis not present

## 2017-03-08 DIAGNOSIS — Z992 Dependence on renal dialysis: Secondary | ICD-10-CM | POA: Diagnosis not present

## 2017-03-08 DIAGNOSIS — N186 End stage renal disease: Secondary | ICD-10-CM | POA: Diagnosis not present

## 2017-03-09 DIAGNOSIS — N186 End stage renal disease: Secondary | ICD-10-CM | POA: Diagnosis not present

## 2017-03-09 DIAGNOSIS — Z992 Dependence on renal dialysis: Secondary | ICD-10-CM | POA: Diagnosis not present

## 2017-03-10 DIAGNOSIS — N186 End stage renal disease: Secondary | ICD-10-CM | POA: Diagnosis not present

## 2017-03-10 DIAGNOSIS — Z992 Dependence on renal dialysis: Secondary | ICD-10-CM | POA: Diagnosis not present

## 2017-03-11 DIAGNOSIS — N186 End stage renal disease: Secondary | ICD-10-CM | POA: Diagnosis not present

## 2017-03-11 DIAGNOSIS — Z992 Dependence on renal dialysis: Secondary | ICD-10-CM | POA: Diagnosis not present

## 2017-03-12 DIAGNOSIS — N186 End stage renal disease: Secondary | ICD-10-CM | POA: Diagnosis not present

## 2017-03-12 DIAGNOSIS — Z992 Dependence on renal dialysis: Secondary | ICD-10-CM | POA: Diagnosis not present

## 2017-03-13 ENCOUNTER — Ambulatory Visit
Admission: RE | Admit: 2017-03-13 | Discharge: 2017-03-13 | Disposition: A | Discharge status: Home / Self Care | Payer: 59 | Source: Ambulatory Visit | Attending: Nephrology | Admitting: Nephrology

## 2017-03-13 ENCOUNTER — Other Ambulatory Visit: Payer: Self-pay | Admitting: Nephrology

## 2017-03-13 DIAGNOSIS — Z4902 Encounter for fitting and adjustment of peritoneal dialysis catheter: Secondary | ICD-10-CM | POA: Insufficient documentation

## 2017-03-13 DIAGNOSIS — Z992 Dependence on renal dialysis: Secondary | ICD-10-CM | POA: Diagnosis not present

## 2017-03-13 DIAGNOSIS — T85611A Breakdown (mechanical) of intraperitoneal dialysis catheter, initial encounter: Secondary | ICD-10-CM

## 2017-03-13 DIAGNOSIS — N186 End stage renal disease: Secondary | ICD-10-CM | POA: Diagnosis not present

## 2017-03-13 DIAGNOSIS — R109 Unspecified abdominal pain: Secondary | ICD-10-CM | POA: Diagnosis not present

## 2017-03-14 DIAGNOSIS — Z992 Dependence on renal dialysis: Secondary | ICD-10-CM | POA: Diagnosis not present

## 2017-03-14 DIAGNOSIS — N186 End stage renal disease: Secondary | ICD-10-CM | POA: Diagnosis not present

## 2017-03-15 DIAGNOSIS — N186 End stage renal disease: Secondary | ICD-10-CM | POA: Diagnosis not present

## 2017-03-15 DIAGNOSIS — Z992 Dependence on renal dialysis: Secondary | ICD-10-CM | POA: Diagnosis not present

## 2017-03-16 DIAGNOSIS — N186 End stage renal disease: Secondary | ICD-10-CM | POA: Diagnosis not present

## 2017-03-16 DIAGNOSIS — Z992 Dependence on renal dialysis: Secondary | ICD-10-CM | POA: Diagnosis not present

## 2017-03-17 DIAGNOSIS — Z992 Dependence on renal dialysis: Secondary | ICD-10-CM | POA: Diagnosis not present

## 2017-03-17 DIAGNOSIS — N186 End stage renal disease: Secondary | ICD-10-CM | POA: Diagnosis not present

## 2017-03-18 DIAGNOSIS — N186 End stage renal disease: Secondary | ICD-10-CM | POA: Diagnosis not present

## 2017-03-18 DIAGNOSIS — Z992 Dependence on renal dialysis: Secondary | ICD-10-CM | POA: Diagnosis not present

## 2017-03-19 DIAGNOSIS — Z992 Dependence on renal dialysis: Secondary | ICD-10-CM | POA: Diagnosis not present

## 2017-03-19 DIAGNOSIS — N186 End stage renal disease: Secondary | ICD-10-CM | POA: Diagnosis not present

## 2017-03-20 DIAGNOSIS — Z992 Dependence on renal dialysis: Secondary | ICD-10-CM | POA: Diagnosis not present

## 2017-03-20 DIAGNOSIS — N186 End stage renal disease: Secondary | ICD-10-CM | POA: Diagnosis not present

## 2017-03-21 DIAGNOSIS — N186 End stage renal disease: Secondary | ICD-10-CM | POA: Diagnosis not present

## 2017-03-21 DIAGNOSIS — Z992 Dependence on renal dialysis: Secondary | ICD-10-CM | POA: Diagnosis not present

## 2017-03-22 DIAGNOSIS — N186 End stage renal disease: Secondary | ICD-10-CM | POA: Diagnosis not present

## 2017-03-22 DIAGNOSIS — Z992 Dependence on renal dialysis: Secondary | ICD-10-CM | POA: Diagnosis not present

## 2017-03-23 DIAGNOSIS — Z992 Dependence on renal dialysis: Secondary | ICD-10-CM | POA: Diagnosis not present

## 2017-03-23 DIAGNOSIS — N186 End stage renal disease: Secondary | ICD-10-CM | POA: Diagnosis not present

## 2017-03-24 DIAGNOSIS — Z992 Dependence on renal dialysis: Secondary | ICD-10-CM | POA: Diagnosis not present

## 2017-03-24 DIAGNOSIS — N186 End stage renal disease: Secondary | ICD-10-CM | POA: Diagnosis not present

## 2017-03-25 DIAGNOSIS — Z992 Dependence on renal dialysis: Secondary | ICD-10-CM | POA: Diagnosis not present

## 2017-03-25 DIAGNOSIS — N186 End stage renal disease: Secondary | ICD-10-CM | POA: Diagnosis not present

## 2017-03-26 DIAGNOSIS — N186 End stage renal disease: Secondary | ICD-10-CM | POA: Diagnosis not present

## 2017-03-26 DIAGNOSIS — Z992 Dependence on renal dialysis: Secondary | ICD-10-CM | POA: Diagnosis not present

## 2017-03-27 DIAGNOSIS — Z992 Dependence on renal dialysis: Secondary | ICD-10-CM | POA: Diagnosis not present

## 2017-03-27 DIAGNOSIS — Z79899 Other long term (current) drug therapy: Secondary | ICD-10-CM | POA: Diagnosis not present

## 2017-03-27 DIAGNOSIS — H35713 Central serous chorioretinopathy, bilateral: Secondary | ICD-10-CM | POA: Diagnosis not present

## 2017-03-27 DIAGNOSIS — H2513 Age-related nuclear cataract, bilateral: Secondary | ICD-10-CM | POA: Diagnosis not present

## 2017-03-27 DIAGNOSIS — H353231 Exudative age-related macular degeneration, bilateral, with active choroidal neovascularization: Secondary | ICD-10-CM | POA: Diagnosis not present

## 2017-03-27 DIAGNOSIS — H3581 Retinal edema: Secondary | ICD-10-CM | POA: Diagnosis not present

## 2017-03-27 DIAGNOSIS — N186 End stage renal disease: Secondary | ICD-10-CM | POA: Diagnosis not present

## 2017-03-27 DIAGNOSIS — H04123 Dry eye syndrome of bilateral lacrimal glands: Secondary | ICD-10-CM | POA: Diagnosis not present

## 2017-03-28 DIAGNOSIS — E7800 Pure hypercholesterolemia, unspecified: Secondary | ICD-10-CM | POA: Diagnosis not present

## 2017-03-28 DIAGNOSIS — Z992 Dependence on renal dialysis: Secondary | ICD-10-CM | POA: Diagnosis not present

## 2017-03-28 DIAGNOSIS — N186 End stage renal disease: Secondary | ICD-10-CM | POA: Diagnosis not present

## 2017-03-29 ENCOUNTER — Ambulatory Visit (INDEPENDENT_AMBULATORY_CARE_PROVIDER_SITE_OTHER): Payer: Medicare Other | Admitting: Family Medicine

## 2017-03-29 DIAGNOSIS — N186 End stage renal disease: Secondary | ICD-10-CM | POA: Diagnosis not present

## 2017-03-29 DIAGNOSIS — Z992 Dependence on renal dialysis: Secondary | ICD-10-CM | POA: Diagnosis not present

## 2017-03-29 DIAGNOSIS — Z23 Encounter for immunization: Secondary | ICD-10-CM

## 2017-03-30 DIAGNOSIS — N186 End stage renal disease: Secondary | ICD-10-CM | POA: Diagnosis not present

## 2017-03-30 DIAGNOSIS — Z992 Dependence on renal dialysis: Secondary | ICD-10-CM | POA: Diagnosis not present

## 2017-03-31 DIAGNOSIS — Z992 Dependence on renal dialysis: Secondary | ICD-10-CM | POA: Diagnosis not present

## 2017-03-31 DIAGNOSIS — N186 End stage renal disease: Secondary | ICD-10-CM | POA: Diagnosis not present

## 2017-04-01 DIAGNOSIS — N186 End stage renal disease: Secondary | ICD-10-CM | POA: Diagnosis not present

## 2017-04-01 DIAGNOSIS — Z992 Dependence on renal dialysis: Secondary | ICD-10-CM | POA: Diagnosis not present

## 2017-04-01 IMAGING — CT CT ABD-PELV W/ CM
1 of 3 series · 12 of 32 positions shown, 17 images · IV contrast (omnipaque)
Comparison: CT 04/10/2015

CLINICAL DATA: Renal biopsy performed in [REDACTED]. History of
amyloidosis.

EXAM:
CT CHEST, ABDOMEN, AND PELVIS WITH CONTRAST
TECHNIQUE: Multidetector CT imaging of the chest, abdomen and pelvis was
performed following the standard protocol during bolus
administration of intravenous contrast.
CONTRAST:  100mL OMNIPAQUE IOHEXOL 300 MG/ML  SOLN

[Series 2: cap with · axial · 0.74mm/px · z∈[-1506,-900]mm · 12 of 139 slices shown, 17 images]
[im 9/139  soft-tissue]
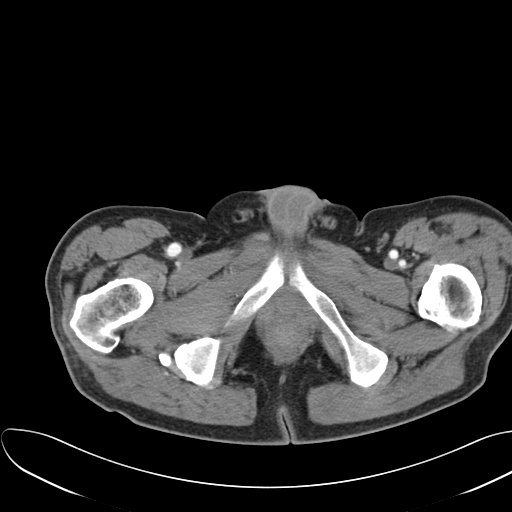
[im 9/139  bone]
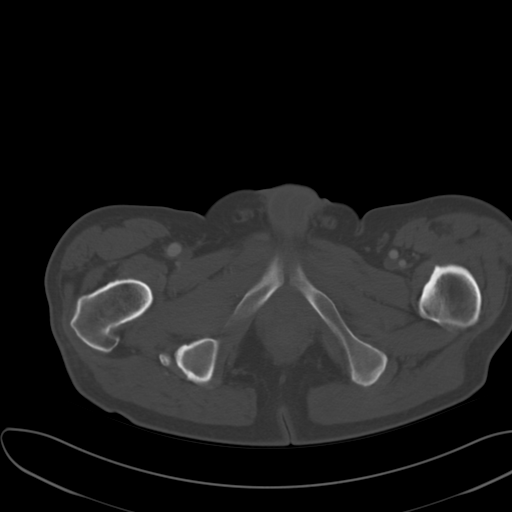
[im 25/139  soft-tissue]
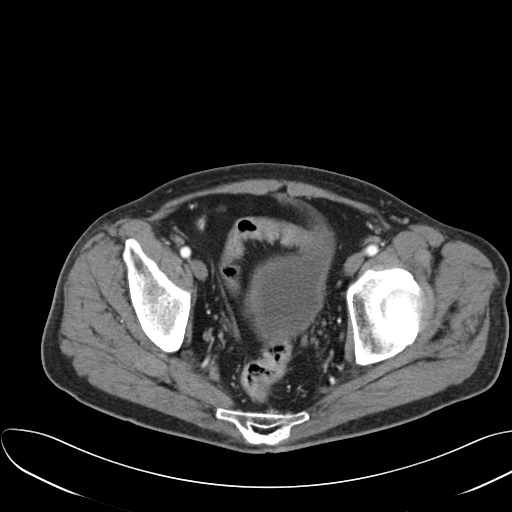
[im 33/139  soft-tissue]
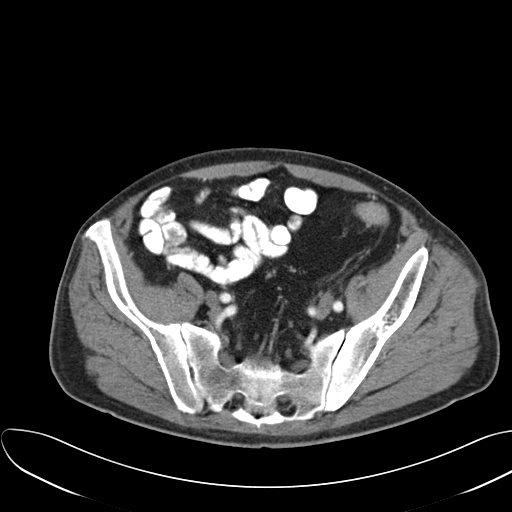
[im 49/139  soft-tissue]
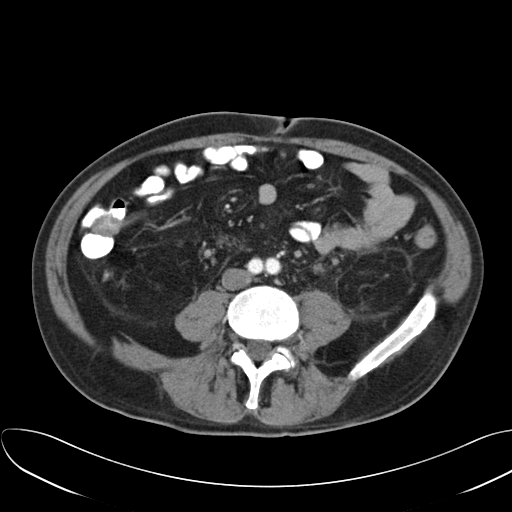
[im 57/139  soft-tissue]
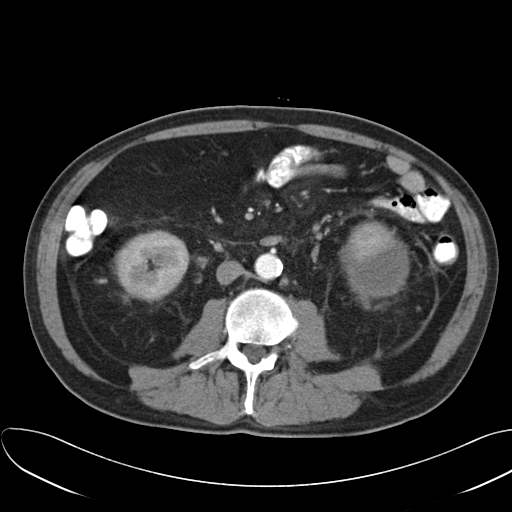
[im 74/139  soft-tissue]
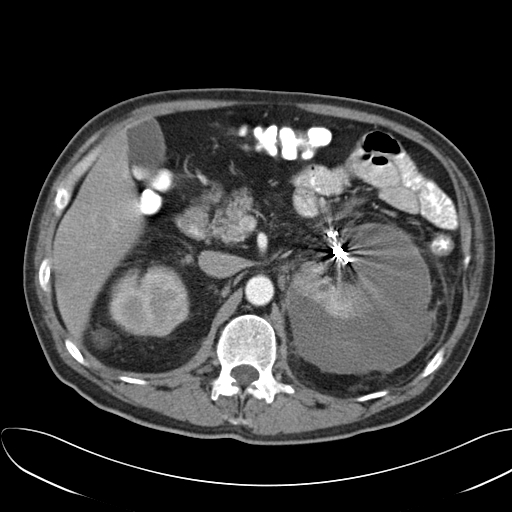
[im 82/139  soft-tissue]
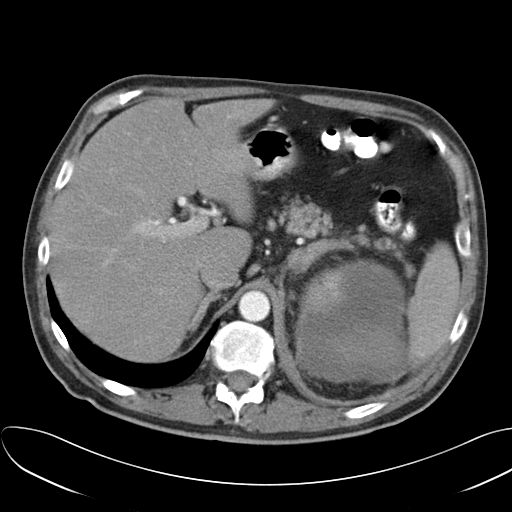
[im 90/139  soft-tissue]
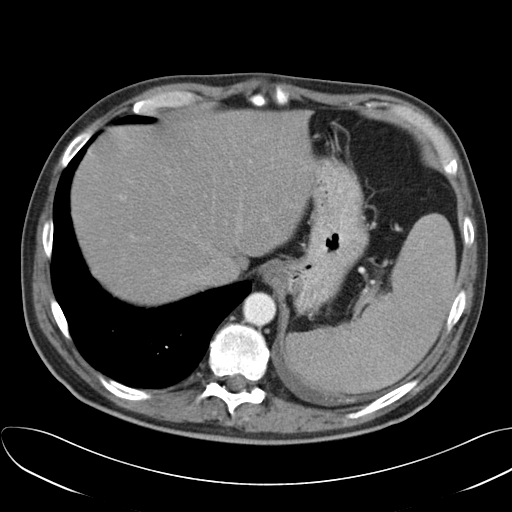
[im 106/139  soft-tissue]
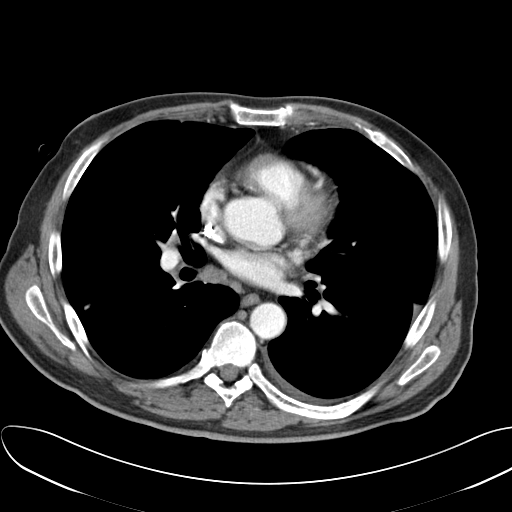
[im 106/139  lung]
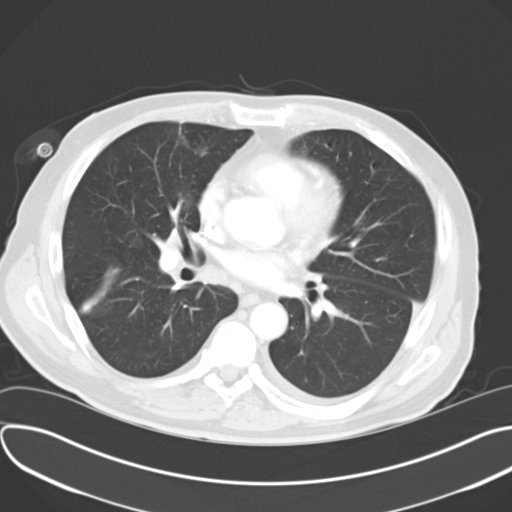
[im 106/139  bone]
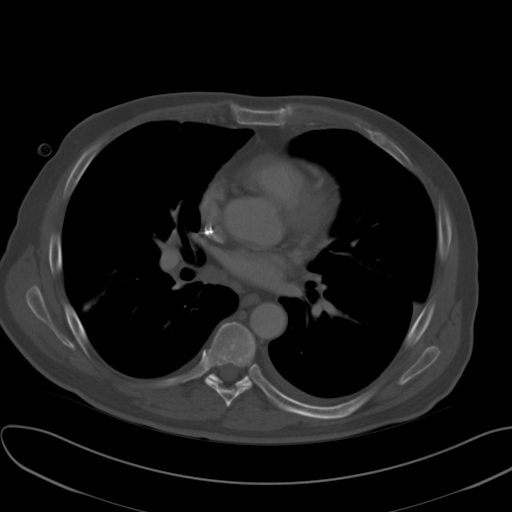
[im 114/139  soft-tissue]
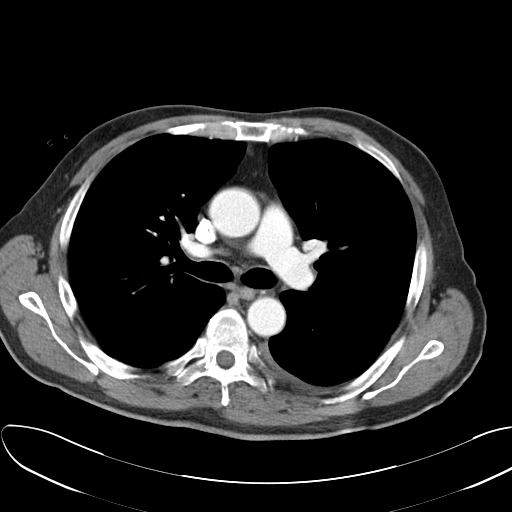
[im 114/139  lung]
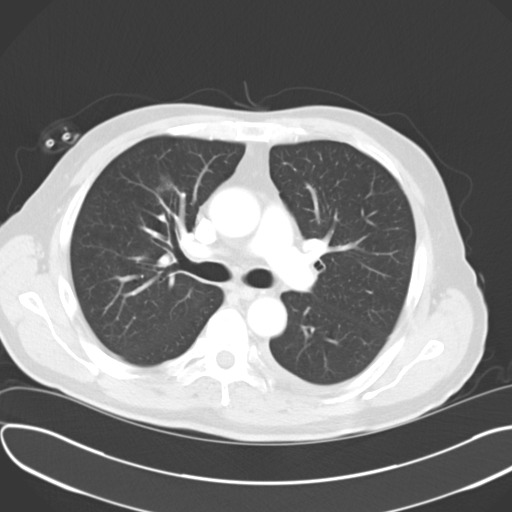
[im 122/139  lung]
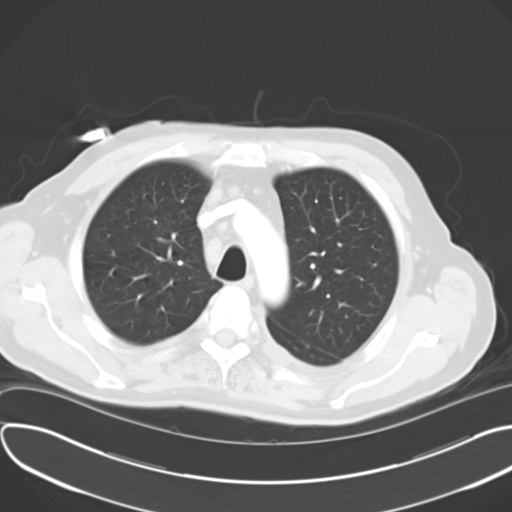
[im 130/139  soft-tissue]
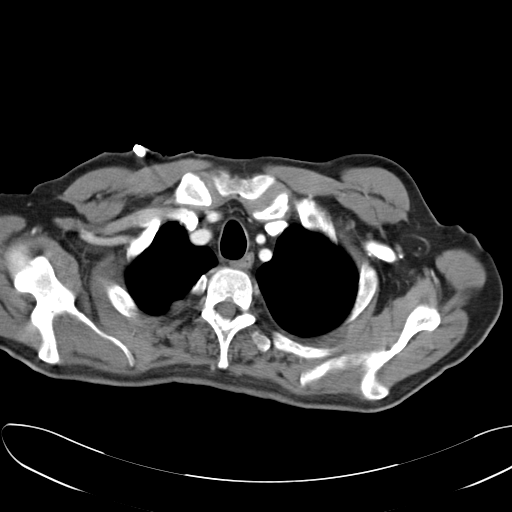
[im 130/139  lung]
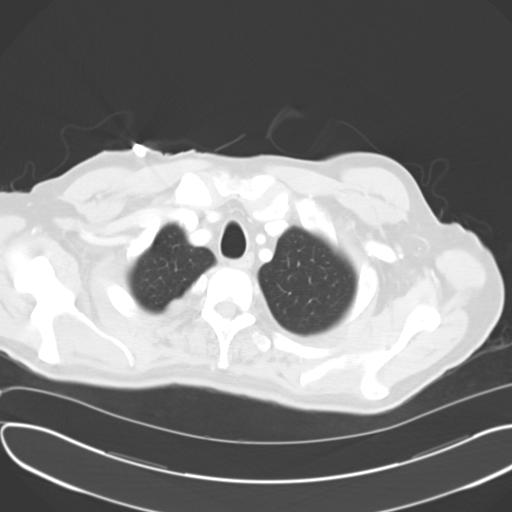

[12 of 32 positions shown; findings below may reference images not displayed]

FINDINGS: CT CHEST FINDINGS

Mediastinum/Nodes: No axillary supraclavicular adenopathy. Enlarged
paratracheal lymph nodes. For example, 12 mm RIGHT lower
paratracheal lymph node. Subcarinal lymph node measures 13 mm short
axis. No central pulmonary embolism. No pericardial fluid. Trace
pericardial fluid. Esophagus normal.

Lungs/Pleura: Scattered ground-glass opacities in the RIGHT upper
lobe . Improvement atelectasis the lung bases. Mild residual
atelectasis of the LEFT lung base.

Musculoskeletal: No aggressive osseous lesion.

CT ABDOMEN AND PELVIS FINDINGS

Hepatobiliary: No focal hepatic lesion. No biliary ductal
dilatation. Gallbladder is normal. Common bile duct is normal.

Pancreas: Pancreas is normal. No ductal dilatation. No pancreatic
inflammation.

Spleen: Normal spleen

Adrenals/urinary tract: Large LEFT renal subcapsular hematoma is
again demonstrated. The hematoma has decreased in density compared
to prior consistent with evolution of blood products. The overall
size of the hematoma is similar. The hematoma extends up
approximately 4.6 cm from the cortex to the capsule compared to
cm on prior. Capsule measures 15 cm craniocaudad dimension
unchanged.

Hypoperfusion of a large portion of the mid kidney extending from
the border of the upper pole to the lower pole (image 71, series 6).
There is a vascular metallic coils the renal hilum which are new
from prior.

The RIGHT kidney is normal size. There is an exophytic simple fluid
lesion extent the posterior aspect the RIGHT kidney. Adrenal glands
are normal.

The LEFT adrenal gland is enlarged similar prior. Uniform
enhancement.

Stomach/Bowel: Stomach, small bowel, appendix, and cecum are normal.
The colon and rectosigmoid colon are normal. There is thickening of
the distal rectum circumferentially to 13 mm on image 125, series 2

Vascular/Lymphatic: Abdominal aorta is normal caliber with
atherosclerotic calcification. There is no retroperitoneal or
periportal lymphadenopathy. No pelvic lymphadenopathy.

Reproductive: Prostate normal.

Other: No free fluid.

Musculoskeletal: No aggressive osseous lesion.
IMPRESSION: Chest Impression:

1. Improvement in bibasilar atelectasis. Persistent LEFT basilar
atelectasis remains.
2. Scattered ground-glass opacities RIGHT upper lobe likely
inflammatory infectious.
3. Mediastinal lymphadenopathy of undetermined etiology. This could
relate to patient's given history of amyloidosis.

Abdomen / Pelvis Impression:

*Large LEFT subcapsular hematoma has NOT decreased in size compared
to CT of 04/10/2015. Decreased in density hematoma consistent with
evolution.
*Devascularization of the central portion of the LEFT kidney likely
related to coiling.
*Stable enlargement of the LEFT adrenal gland consistent with
resolving spontaneous hemorrhage
*Thickening of the distal rectum is nonspecific and may represent
nondistention. Considered digital rectal exam.

## 2017-04-02 DIAGNOSIS — N186 End stage renal disease: Secondary | ICD-10-CM | POA: Diagnosis not present

## 2017-04-02 DIAGNOSIS — Z992 Dependence on renal dialysis: Secondary | ICD-10-CM | POA: Diagnosis not present

## 2017-04-03 DIAGNOSIS — Z992 Dependence on renal dialysis: Secondary | ICD-10-CM | POA: Diagnosis not present

## 2017-04-03 DIAGNOSIS — N186 End stage renal disease: Secondary | ICD-10-CM | POA: Diagnosis not present

## 2017-04-04 DIAGNOSIS — Z992 Dependence on renal dialysis: Secondary | ICD-10-CM | POA: Diagnosis not present

## 2017-04-04 DIAGNOSIS — N186 End stage renal disease: Secondary | ICD-10-CM | POA: Diagnosis not present

## 2017-04-05 DIAGNOSIS — N186 End stage renal disease: Secondary | ICD-10-CM | POA: Diagnosis not present

## 2017-04-05 DIAGNOSIS — Z992 Dependence on renal dialysis: Secondary | ICD-10-CM | POA: Diagnosis not present

## 2017-04-06 DIAGNOSIS — N186 End stage renal disease: Secondary | ICD-10-CM | POA: Diagnosis not present

## 2017-04-06 DIAGNOSIS — Z992 Dependence on renal dialysis: Secondary | ICD-10-CM | POA: Diagnosis not present

## 2017-04-07 DIAGNOSIS — Z992 Dependence on renal dialysis: Secondary | ICD-10-CM | POA: Diagnosis not present

## 2017-04-07 DIAGNOSIS — N186 End stage renal disease: Secondary | ICD-10-CM | POA: Diagnosis not present

## 2017-04-08 DIAGNOSIS — Z992 Dependence on renal dialysis: Secondary | ICD-10-CM | POA: Diagnosis not present

## 2017-04-08 DIAGNOSIS — N186 End stage renal disease: Secondary | ICD-10-CM | POA: Diagnosis not present

## 2017-04-09 DIAGNOSIS — H04123 Dry eye syndrome of bilateral lacrimal glands: Secondary | ICD-10-CM | POA: Diagnosis not present

## 2017-04-09 DIAGNOSIS — H02886 Meibomian gland dysfunction of left eye, unspecified eyelid: Secondary | ICD-10-CM | POA: Diagnosis not present

## 2017-04-09 DIAGNOSIS — H02831 Dermatochalasis of right upper eyelid: Secondary | ICD-10-CM | POA: Diagnosis not present

## 2017-04-09 DIAGNOSIS — Z992 Dependence on renal dialysis: Secondary | ICD-10-CM | POA: Diagnosis not present

## 2017-04-09 DIAGNOSIS — H02889 Meibomian gland dysfunction of unspecified eye, unspecified eyelid: Secondary | ICD-10-CM | POA: Diagnosis not present

## 2017-04-09 DIAGNOSIS — H2513 Age-related nuclear cataract, bilateral: Secondary | ICD-10-CM | POA: Diagnosis not present

## 2017-04-09 DIAGNOSIS — H269 Unspecified cataract: Secondary | ICD-10-CM | POA: Diagnosis not present

## 2017-04-09 DIAGNOSIS — H02883 Meibomian gland dysfunction of right eye, unspecified eyelid: Secondary | ICD-10-CM | POA: Diagnosis not present

## 2017-04-09 DIAGNOSIS — N186 End stage renal disease: Secondary | ICD-10-CM | POA: Diagnosis not present

## 2017-04-09 DIAGNOSIS — H02834 Dermatochalasis of left upper eyelid: Secondary | ICD-10-CM | POA: Diagnosis not present

## 2017-04-10 DIAGNOSIS — N186 End stage renal disease: Secondary | ICD-10-CM | POA: Diagnosis not present

## 2017-04-10 DIAGNOSIS — Z992 Dependence on renal dialysis: Secondary | ICD-10-CM | POA: Diagnosis not present

## 2017-04-11 DIAGNOSIS — Z992 Dependence on renal dialysis: Secondary | ICD-10-CM | POA: Diagnosis not present

## 2017-04-11 DIAGNOSIS — N186 End stage renal disease: Secondary | ICD-10-CM | POA: Diagnosis not present

## 2017-04-12 DIAGNOSIS — Z992 Dependence on renal dialysis: Secondary | ICD-10-CM | POA: Diagnosis not present

## 2017-04-12 DIAGNOSIS — N186 End stage renal disease: Secondary | ICD-10-CM | POA: Diagnosis not present

## 2017-04-13 DIAGNOSIS — Z992 Dependence on renal dialysis: Secondary | ICD-10-CM | POA: Diagnosis not present

## 2017-04-13 DIAGNOSIS — N186 End stage renal disease: Secondary | ICD-10-CM | POA: Diagnosis not present

## 2017-04-14 DIAGNOSIS — Z992 Dependence on renal dialysis: Secondary | ICD-10-CM | POA: Diagnosis not present

## 2017-04-14 DIAGNOSIS — N186 End stage renal disease: Secondary | ICD-10-CM | POA: Diagnosis not present

## 2017-04-15 DIAGNOSIS — N186 End stage renal disease: Secondary | ICD-10-CM | POA: Diagnosis not present

## 2017-04-15 DIAGNOSIS — Z992 Dependence on renal dialysis: Secondary | ICD-10-CM | POA: Diagnosis not present

## 2017-04-16 DIAGNOSIS — Z992 Dependence on renal dialysis: Secondary | ICD-10-CM | POA: Diagnosis not present

## 2017-04-16 DIAGNOSIS — N186 End stage renal disease: Secondary | ICD-10-CM | POA: Diagnosis not present

## 2017-04-17 DIAGNOSIS — H547 Unspecified visual loss: Secondary | ICD-10-CM | POA: Diagnosis not present

## 2017-04-17 DIAGNOSIS — K219 Gastro-esophageal reflux disease without esophagitis: Secondary | ICD-10-CM | POA: Diagnosis not present

## 2017-04-17 DIAGNOSIS — N186 End stage renal disease: Secondary | ICD-10-CM | POA: Diagnosis not present

## 2017-04-17 DIAGNOSIS — Z992 Dependence on renal dialysis: Secondary | ICD-10-CM | POA: Diagnosis not present

## 2017-04-17 DIAGNOSIS — Z87891 Personal history of nicotine dependence: Secondary | ICD-10-CM | POA: Diagnosis not present

## 2017-04-17 DIAGNOSIS — Z881 Allergy status to other antibiotic agents status: Secondary | ICD-10-CM | POA: Diagnosis not present

## 2017-04-17 DIAGNOSIS — H25812 Combined forms of age-related cataract, left eye: Secondary | ICD-10-CM | POA: Diagnosis not present

## 2017-04-17 DIAGNOSIS — J449 Chronic obstructive pulmonary disease, unspecified: Secondary | ICD-10-CM | POA: Diagnosis not present

## 2017-04-17 DIAGNOSIS — Z7682 Awaiting organ transplant status: Secondary | ICD-10-CM | POA: Diagnosis not present

## 2017-04-17 DIAGNOSIS — I120 Hypertensive chronic kidney disease with stage 5 chronic kidney disease or end stage renal disease: Secondary | ICD-10-CM | POA: Diagnosis not present

## 2017-04-17 DIAGNOSIS — E7800 Pure hypercholesterolemia, unspecified: Secondary | ICD-10-CM | POA: Diagnosis not present

## 2017-04-17 DIAGNOSIS — Z79899 Other long term (current) drug therapy: Secondary | ICD-10-CM | POA: Diagnosis not present

## 2017-04-18 DIAGNOSIS — Z992 Dependence on renal dialysis: Secondary | ICD-10-CM | POA: Diagnosis not present

## 2017-04-18 DIAGNOSIS — N186 End stage renal disease: Secondary | ICD-10-CM | POA: Diagnosis not present

## 2017-04-19 DIAGNOSIS — Z992 Dependence on renal dialysis: Secondary | ICD-10-CM | POA: Diagnosis not present

## 2017-04-19 DIAGNOSIS — N186 End stage renal disease: Secondary | ICD-10-CM | POA: Diagnosis not present

## 2017-04-20 DIAGNOSIS — N186 End stage renal disease: Secondary | ICD-10-CM | POA: Diagnosis not present

## 2017-04-20 DIAGNOSIS — Z992 Dependence on renal dialysis: Secondary | ICD-10-CM | POA: Diagnosis not present

## 2017-04-21 DIAGNOSIS — N186 End stage renal disease: Secondary | ICD-10-CM | POA: Diagnosis not present

## 2017-04-21 DIAGNOSIS — Z992 Dependence on renal dialysis: Secondary | ICD-10-CM | POA: Diagnosis not present

## 2017-04-22 DIAGNOSIS — N186 End stage renal disease: Secondary | ICD-10-CM | POA: Diagnosis not present

## 2017-04-22 DIAGNOSIS — Z992 Dependence on renal dialysis: Secondary | ICD-10-CM | POA: Diagnosis not present

## 2017-04-23 DIAGNOSIS — Z992 Dependence on renal dialysis: Secondary | ICD-10-CM | POA: Diagnosis not present

## 2017-04-23 DIAGNOSIS — N186 End stage renal disease: Secondary | ICD-10-CM | POA: Diagnosis not present

## 2017-04-24 DIAGNOSIS — N186 End stage renal disease: Secondary | ICD-10-CM | POA: Diagnosis not present

## 2017-04-24 DIAGNOSIS — Z992 Dependence on renal dialysis: Secondary | ICD-10-CM | POA: Diagnosis not present

## 2017-04-25 DIAGNOSIS — Z992 Dependence on renal dialysis: Secondary | ICD-10-CM | POA: Diagnosis not present

## 2017-04-25 DIAGNOSIS — N186 End stage renal disease: Secondary | ICD-10-CM | POA: Diagnosis not present

## 2017-04-26 DIAGNOSIS — Z992 Dependence on renal dialysis: Secondary | ICD-10-CM | POA: Diagnosis not present

## 2017-04-26 DIAGNOSIS — N186 End stage renal disease: Secondary | ICD-10-CM | POA: Diagnosis not present

## 2017-04-27 DIAGNOSIS — Z992 Dependence on renal dialysis: Secondary | ICD-10-CM | POA: Diagnosis not present

## 2017-04-27 DIAGNOSIS — N186 End stage renal disease: Secondary | ICD-10-CM | POA: Diagnosis not present

## 2017-04-28 DIAGNOSIS — Z992 Dependence on renal dialysis: Secondary | ICD-10-CM | POA: Diagnosis not present

## 2017-04-28 DIAGNOSIS — N186 End stage renal disease: Secondary | ICD-10-CM | POA: Diagnosis not present

## 2017-04-29 DIAGNOSIS — N186 End stage renal disease: Secondary | ICD-10-CM | POA: Diagnosis not present

## 2017-04-29 DIAGNOSIS — Z992 Dependence on renal dialysis: Secondary | ICD-10-CM | POA: Diagnosis not present

## 2017-04-30 DIAGNOSIS — H2511 Age-related nuclear cataract, right eye: Secondary | ICD-10-CM | POA: Diagnosis not present

## 2017-04-30 DIAGNOSIS — N186 End stage renal disease: Secondary | ICD-10-CM | POA: Diagnosis not present

## 2017-04-30 DIAGNOSIS — H02889 Meibomian gland dysfunction of unspecified eye, unspecified eyelid: Secondary | ICD-10-CM | POA: Diagnosis not present

## 2017-04-30 DIAGNOSIS — I120 Hypertensive chronic kidney disease with stage 5 chronic kidney disease or end stage renal disease: Secondary | ICD-10-CM | POA: Diagnosis not present

## 2017-04-30 DIAGNOSIS — H02834 Dermatochalasis of left upper eyelid: Secondary | ICD-10-CM | POA: Diagnosis not present

## 2017-04-30 DIAGNOSIS — H04123 Dry eye syndrome of bilateral lacrimal glands: Secondary | ICD-10-CM | POA: Diagnosis not present

## 2017-04-30 DIAGNOSIS — H35723 Serous detachment of retinal pigment epithelium, bilateral: Secondary | ICD-10-CM | POA: Diagnosis not present

## 2017-04-30 DIAGNOSIS — H3581 Retinal edema: Secondary | ICD-10-CM | POA: Diagnosis not present

## 2017-04-30 DIAGNOSIS — H35713 Central serous chorioretinopathy, bilateral: Secondary | ICD-10-CM | POA: Diagnosis not present

## 2017-04-30 DIAGNOSIS — Z9842 Cataract extraction status, left eye: Secondary | ICD-10-CM | POA: Diagnosis not present

## 2017-04-30 DIAGNOSIS — H2513 Age-related nuclear cataract, bilateral: Secondary | ICD-10-CM | POA: Diagnosis not present

## 2017-04-30 DIAGNOSIS — H3500 Unspecified background retinopathy: Secondary | ICD-10-CM | POA: Diagnosis not present

## 2017-04-30 DIAGNOSIS — E859 Amyloidosis, unspecified: Secondary | ICD-10-CM | POA: Diagnosis not present

## 2017-04-30 DIAGNOSIS — H353231 Exudative age-related macular degeneration, bilateral, with active choroidal neovascularization: Secondary | ICD-10-CM | POA: Diagnosis not present

## 2017-04-30 DIAGNOSIS — Z961 Presence of intraocular lens: Secondary | ICD-10-CM | POA: Diagnosis not present

## 2017-04-30 DIAGNOSIS — Z992 Dependence on renal dialysis: Secondary | ICD-10-CM | POA: Diagnosis not present

## 2017-04-30 DIAGNOSIS — H02831 Dermatochalasis of right upper eyelid: Secondary | ICD-10-CM | POA: Diagnosis not present

## 2017-04-30 DIAGNOSIS — Z79899 Other long term (current) drug therapy: Secondary | ICD-10-CM | POA: Diagnosis not present

## 2017-05-01 DIAGNOSIS — Z992 Dependence on renal dialysis: Secondary | ICD-10-CM | POA: Diagnosis not present

## 2017-05-01 DIAGNOSIS — N186 End stage renal disease: Secondary | ICD-10-CM | POA: Diagnosis not present

## 2017-05-02 ENCOUNTER — Ambulatory Visit: Admission: RE | Admit: 2017-05-02 | Payer: 59 | Source: Ambulatory Visit

## 2017-05-02 ENCOUNTER — Ambulatory Visit
Admission: RE | Admit: 2017-05-02 | Discharge: 2017-05-02 | Disposition: A | Discharge status: Home / Self Care | Payer: 59 | Source: Ambulatory Visit | Attending: Family Medicine | Admitting: Family Medicine

## 2017-05-02 DIAGNOSIS — R918 Other nonspecific abnormal finding of lung field: Secondary | ICD-10-CM | POA: Insufficient documentation

## 2017-05-02 DIAGNOSIS — K8020 Calculus of gallbladder without cholecystitis without obstruction: Secondary | ICD-10-CM | POA: Diagnosis not present

## 2017-05-02 DIAGNOSIS — R188 Other ascites: Secondary | ICD-10-CM | POA: Insufficient documentation

## 2017-05-02 DIAGNOSIS — M799 Soft tissue disorder, unspecified: Secondary | ICD-10-CM | POA: Insufficient documentation

## 2017-05-02 DIAGNOSIS — R161 Splenomegaly, not elsewhere classified: Secondary | ICD-10-CM | POA: Insufficient documentation

## 2017-05-02 DIAGNOSIS — R590 Localized enlarged lymph nodes: Secondary | ICD-10-CM | POA: Insufficient documentation

## 2017-05-02 DIAGNOSIS — N186 End stage renal disease: Secondary | ICD-10-CM | POA: Diagnosis not present

## 2017-05-02 DIAGNOSIS — Z992 Dependence on renal dialysis: Secondary | ICD-10-CM | POA: Diagnosis not present

## 2017-05-02 DIAGNOSIS — N280 Ischemia and infarction of kidney: Secondary | ICD-10-CM | POA: Diagnosis not present

## 2017-05-03 DIAGNOSIS — N186 End stage renal disease: Secondary | ICD-10-CM | POA: Diagnosis not present

## 2017-05-03 DIAGNOSIS — Z992 Dependence on renal dialysis: Secondary | ICD-10-CM | POA: Diagnosis not present

## 2017-05-04 ENCOUNTER — Other Ambulatory Visit: Payer: Self-pay | Admitting: *Deleted

## 2017-05-04 DIAGNOSIS — N186 End stage renal disease: Secondary | ICD-10-CM | POA: Diagnosis not present

## 2017-05-04 DIAGNOSIS — Z992 Dependence on renal dialysis: Secondary | ICD-10-CM | POA: Diagnosis not present

## 2017-05-04 MED ORDER — AZITHROMYCIN 500 MG PO TABS: 500.0000 mg | ORAL_TABLET | Freq: Every day | ORAL | 0 refills | Status: DC

## 2017-05-05 DIAGNOSIS — N186 End stage renal disease: Secondary | ICD-10-CM | POA: Diagnosis not present

## 2017-05-05 DIAGNOSIS — Z992 Dependence on renal dialysis: Secondary | ICD-10-CM | POA: Diagnosis not present

## 2017-05-06 DIAGNOSIS — Z992 Dependence on renal dialysis: Secondary | ICD-10-CM | POA: Diagnosis not present

## 2017-05-06 DIAGNOSIS — N186 End stage renal disease: Secondary | ICD-10-CM | POA: Diagnosis not present

## 2017-05-07 DIAGNOSIS — Z992 Dependence on renal dialysis: Secondary | ICD-10-CM | POA: Diagnosis not present

## 2017-05-07 DIAGNOSIS — N186 End stage renal disease: Secondary | ICD-10-CM | POA: Diagnosis not present

## 2017-05-08 DIAGNOSIS — N186 End stage renal disease: Secondary | ICD-10-CM | POA: Diagnosis not present

## 2017-05-08 DIAGNOSIS — Z992 Dependence on renal dialysis: Secondary | ICD-10-CM | POA: Diagnosis not present

## 2017-05-09 DIAGNOSIS — Z992 Dependence on renal dialysis: Secondary | ICD-10-CM | POA: Diagnosis not present

## 2017-05-09 DIAGNOSIS — Z01818 Encounter for other preprocedural examination: Secondary | ICD-10-CM | POA: Diagnosis not present

## 2017-05-09 DIAGNOSIS — N186 End stage renal disease: Secondary | ICD-10-CM | POA: Diagnosis not present

## 2017-05-10 DIAGNOSIS — Z992 Dependence on renal dialysis: Secondary | ICD-10-CM | POA: Diagnosis not present

## 2017-05-10 DIAGNOSIS — N186 End stage renal disease: Secondary | ICD-10-CM | POA: Diagnosis not present

## 2017-05-11 DIAGNOSIS — N186 End stage renal disease: Secondary | ICD-10-CM | POA: Diagnosis not present

## 2017-05-11 DIAGNOSIS — Z992 Dependence on renal dialysis: Secondary | ICD-10-CM | POA: Diagnosis not present

## 2017-05-12 DIAGNOSIS — Z992 Dependence on renal dialysis: Secondary | ICD-10-CM | POA: Diagnosis not present

## 2017-05-12 DIAGNOSIS — N186 End stage renal disease: Secondary | ICD-10-CM | POA: Diagnosis not present

## 2017-05-13 DIAGNOSIS — N186 End stage renal disease: Secondary | ICD-10-CM | POA: Diagnosis not present

## 2017-05-13 DIAGNOSIS — Z992 Dependence on renal dialysis: Secondary | ICD-10-CM | POA: Diagnosis not present

## 2017-05-14 DIAGNOSIS — Z992 Dependence on renal dialysis: Secondary | ICD-10-CM | POA: Diagnosis not present

## 2017-05-14 DIAGNOSIS — N186 End stage renal disease: Secondary | ICD-10-CM | POA: Diagnosis not present

## 2017-05-15 DIAGNOSIS — N186 End stage renal disease: Secondary | ICD-10-CM | POA: Diagnosis not present

## 2017-05-15 DIAGNOSIS — Z992 Dependence on renal dialysis: Secondary | ICD-10-CM | POA: Diagnosis not present

## 2017-05-16 DIAGNOSIS — Z992 Dependence on renal dialysis: Secondary | ICD-10-CM | POA: Diagnosis not present

## 2017-05-16 DIAGNOSIS — N186 End stage renal disease: Secondary | ICD-10-CM | POA: Diagnosis not present

## 2017-05-17 DIAGNOSIS — Z992 Dependence on renal dialysis: Secondary | ICD-10-CM | POA: Diagnosis not present

## 2017-05-17 DIAGNOSIS — N186 End stage renal disease: Secondary | ICD-10-CM | POA: Diagnosis not present

## 2017-05-18 DIAGNOSIS — N186 End stage renal disease: Secondary | ICD-10-CM | POA: Diagnosis not present

## 2017-05-18 DIAGNOSIS — Z992 Dependence on renal dialysis: Secondary | ICD-10-CM | POA: Diagnosis not present

## 2017-05-19 DIAGNOSIS — N186 End stage renal disease: Secondary | ICD-10-CM | POA: Diagnosis not present

## 2017-05-19 DIAGNOSIS — Z992 Dependence on renal dialysis: Secondary | ICD-10-CM | POA: Diagnosis not present

## 2017-05-20 DIAGNOSIS — N186 End stage renal disease: Secondary | ICD-10-CM | POA: Diagnosis not present

## 2017-05-20 DIAGNOSIS — Z992 Dependence on renal dialysis: Secondary | ICD-10-CM | POA: Diagnosis not present

## 2017-05-21 ENCOUNTER — Ambulatory Visit: Payer: 59 | Admitting: Internal Medicine

## 2017-05-21 DIAGNOSIS — Z992 Dependence on renal dialysis: Secondary | ICD-10-CM | POA: Diagnosis not present

## 2017-05-21 DIAGNOSIS — N186 End stage renal disease: Secondary | ICD-10-CM | POA: Diagnosis not present

## 2017-05-22 DIAGNOSIS — H547 Unspecified visual loss: Secondary | ICD-10-CM | POA: Diagnosis not present

## 2017-05-22 DIAGNOSIS — I120 Hypertensive chronic kidney disease with stage 5 chronic kidney disease or end stage renal disease: Secondary | ICD-10-CM | POA: Diagnosis not present

## 2017-05-22 DIAGNOSIS — H25811 Combined forms of age-related cataract, right eye: Secondary | ICD-10-CM | POA: Diagnosis not present

## 2017-05-22 DIAGNOSIS — Z992 Dependence on renal dialysis: Secondary | ICD-10-CM | POA: Diagnosis not present

## 2017-05-22 DIAGNOSIS — Z79899 Other long term (current) drug therapy: Secondary | ICD-10-CM | POA: Diagnosis not present

## 2017-05-22 DIAGNOSIS — E7800 Pure hypercholesterolemia, unspecified: Secondary | ICD-10-CM | POA: Diagnosis not present

## 2017-05-22 DIAGNOSIS — K219 Gastro-esophageal reflux disease without esophagitis: Secondary | ICD-10-CM | POA: Diagnosis not present

## 2017-05-22 DIAGNOSIS — J449 Chronic obstructive pulmonary disease, unspecified: Secondary | ICD-10-CM | POA: Diagnosis not present

## 2017-05-22 DIAGNOSIS — N186 End stage renal disease: Secondary | ICD-10-CM | POA: Diagnosis not present

## 2017-05-22 DIAGNOSIS — E785 Hyperlipidemia, unspecified: Secondary | ICD-10-CM | POA: Diagnosis not present

## 2017-05-23 DIAGNOSIS — N186 End stage renal disease: Secondary | ICD-10-CM | POA: Diagnosis not present

## 2017-05-23 DIAGNOSIS — Z992 Dependence on renal dialysis: Secondary | ICD-10-CM | POA: Diagnosis not present

## 2017-05-24 ENCOUNTER — Encounter: Payer: Self-pay | Admitting: Internal Medicine

## 2017-05-24 ENCOUNTER — Ambulatory Visit (INDEPENDENT_AMBULATORY_CARE_PROVIDER_SITE_OTHER): Payer: 59 | Admitting: Internal Medicine

## 2017-05-24 VITALS — BP 142/88 | HR 78 | Resp 16 | Ht 71.0 in | Wt 197.0 lb

## 2017-05-24 DIAGNOSIS — Z992 Dependence on renal dialysis: Secondary | ICD-10-CM | POA: Diagnosis not present

## 2017-05-24 DIAGNOSIS — N186 End stage renal disease: Secondary | ICD-10-CM | POA: Diagnosis not present

## 2017-05-24 DIAGNOSIS — J449 Chronic obstructive pulmonary disease, unspecified: Secondary | ICD-10-CM

## 2017-05-24 MED ORDER — ALBUTEROL SULFATE HFA 108 (90 BASE) MCG/ACT IN AERS: 2.0000 | INHALATION_SPRAY | Freq: Four times a day (QID) | RESPIRATORY_TRACT | 2 refills | Status: DC | PRN

## 2017-05-24 NOTE — Progress Notes (Signed)
[place] Pulmonary Medicine Consultation     Date: 05/24/2017,   MRN# [id number] Joseph [name] 1966-05-10 Code Status:  Hosp day:@LENGTHOFSTAYDAYS @ Referring MD: @ATDPROV @     PCP:      AdmissionWeight: 197 lb (89.4 kg)                 CurrentWeight: 197 lb (89.4 kg)  Synopsis:  Dx of Amyloidosis ESRD on HD with chronic cough -patient with previous h/o hemorrhagic left sided effusion with h/o heavy smoking PFT's: 07/07/15 ratio 86% FEV1 85%, 6MWT WNL DLCO 44%   CC: follow up SOB, cough  HPI:   doing well overall, uses albuterol inhaler infrequently No sob, chest pain No signs of infection at this time    Current Medication:   Current Outpatient Medications:  .  albuterol (PROAIR HFA) 108 (90 Base) MCG/ACT inhaler, Inhale into the lungs., Disp: , Rfl:  .  Artificial Tear Solution (SOOTHE XP OP), Apply 1 drop to eye as needed., Disp: , Rfl:  .  atorvastatin (LIPITOR) 20 MG tablet, TAKE 1 TABLET BY MOUTH ONCE DAILY, Disp: 330 tablet, Rfl: 0 .  cycloSPORINE (RESTASIS) 0.05 % ophthalmic emulsion, Place 1 drop into both eyes daily. , Disp: , Rfl:  .  Epoetin Alfa (EPOGEN IJ), Inject as directed every 30 (thirty) days. Administered at Dialysis center., Disp: , Rfl:  .  folic acid (FOLVITE) 1 MG tablet, Take 5 mg by mouth daily., Disp: , Rfl:  .  gentamicin cream (GARAMYCIN) 0.1 %, Apply 1 application topically at bedtime. , Disp: , Rfl: 0 .  Hypromellose (ARTIFICIAL TEARS OP), Apply 1 drop to eye as needed., Disp: , Rfl:  .  losartan (COZAAR) 100 MG tablet, Take 1 tablet (100 mg total) by mouth daily. (Patient taking differently: Take 50 mg by mouth daily. ), Disp: 90 tablet, Rfl: 3 .  methotrexate (RHEUMATREX) 2.5 MG tablet, Take 10 mg by mouth once a week., Disp: , Rfl:  .  ondansetron (ZOFRAN-ODT) 4 MG disintegrating tablet, Take 1 tablet (4 mg total) by mouth every 8 (eight) hours as needed for nausea or vomiting., Disp: 30 tablet, Rfl: 0 .  pantoprazole (PROTONIX) 40 MG  tablet, Take 1 tablet (40 mg total) by mouth 2 (two) times daily before a meal. (Patient taking differently: Take 40 mg by mouth daily. ), Disp: 60 tablet, Rfl: 0 .  VELTASSA 8.4 g packet, Take 1 packet by mouth daily., Disp: , Rfl: 3     ALLERGIES   Ciprofloxacin hcl; Doxycycline hyclate; and Phenol-glycerin     REVIEW OF SYSTEMS   Review of Systems  Constitutional: Negative for chills, fever, malaise/fatigue and weight loss.  Respiratory: Negative for cough, shortness of breath and wheezing.   Cardiovascular: Negative for chest pain, orthopnea and leg swelling.  Gastrointestinal: Negative for abdominal pain and nausea.     VS: BP (!) 142/88 (BP Location: Left Arm, Cuff Size: Normal)   Pulse 78   Resp 16   Ht 5\' 11"  (1.803 m)   Wt 197 lb (89.4 kg)   SpO2 100%   BMI 27.48 kg/m      PHYSICAL EXAM   Physical Exam  Constitutional: No distress.  Cardiovascular: Normal rate, regular rhythm and normal heart sounds.  No murmur heard. Pulmonary/Chest: Effort normal and breath sounds normal. No respiratory distress. He has no wheezes.  Abdominal: Soft. Bowel sounds are normal.  Surgical scars clean intact, PD catheter in place  Skin: He is not diaphoretic.  ASSESSMENT/PLAN   51 yo white male with new Dx of Amyloidosis ESRD on HD with chronic cough-resolved with h/o heavy smoking findings c/w very mild COPD based on flow volume loops with slight concavity of end exp flow limitation-no acute complaints at this time  1.albuterol as needed 2.avoid second hand smokle 3.cough suppressants as needed 4.follow up with [facility]   Follow up in 6 months   I have personally obtained a history, examined the patient, evaluated laboratory and independently reviewed imaging results,  The Patient requires high complexity decision making for assessment and support, frequent evaluation and titration of therapies, application of advanced monitoring technologies and  extensive interpretation of multiple databases. Patient/Family are satisfied with Plan of action and management. All questions answered   Corrin Parker, M.D.  [facility]  Medical Director [place] Director [facility] Cardio-Pulmonary Department

## 2017-05-24 NOTE — Progress Notes (Signed)
[place] Pulmonary Medicine Consultation     Date: 05/24/2017,   MRN# [id number] Joseph [name] 04-27-1966 Code Status:  Hosp day:@LENGTHOFSTAYDAYS @ Referring MD: @ATDPROV @     PCP:      AdmissionWeight: 197 lb (89.4 kg)                 CurrentWeight: 197 lb (89.4 kg)  Synopsis:  Dx of Amyloidosis ESRD on HD with chronic cough -patient with previous h/o hemorrhagic left sided effusion with h/o heavy smoking PFT's: 07/07/15 ratio 86% FEV1 85%, 6MWT WNL DLCO 44%   CC: follow up SOB, cough, COPD  HPI:   doing well overall, uses albuterol inhaler infrequently No sob, chest pain No signs of infection at this time Had RT LOWER lobe pneumonia 01/2017 on CT chest Repeat CT chest 04/2017 showed complete resolution of pneumonia  CT scans discussed with patient and family    Current Medication:   Current Outpatient Medications:  .  albuterol (PROAIR HFA) 108 (90 Base) MCG/ACT inhaler, Inhale into the lungs., Disp: , Rfl:  .  Artificial Tear Solution (SOOTHE XP OP), Apply 1 drop to eye as needed., Disp: , Rfl:  .  atorvastatin (LIPITOR) 20 MG tablet, TAKE 1 TABLET BY MOUTH ONCE DAILY, Disp: 330 tablet, Rfl: 0 .  cycloSPORINE (RESTASIS) 0.05 % ophthalmic emulsion, Place 1 drop into both eyes daily. , Disp: , Rfl:  .  Epoetin Alfa (EPOGEN IJ), Inject as directed every 30 (thirty) days. Administered at Dialysis center., Disp: , Rfl:  .  folic acid (FOLVITE) 1 MG tablet, Take 5 mg by mouth daily., Disp: , Rfl:  .  gentamicin cream (GARAMYCIN) 0.1 %, Apply 1 application topically at bedtime. , Disp: , Rfl: 0 .  Hypromellose (ARTIFICIAL TEARS OP), Apply 1 drop to eye as needed., Disp: , Rfl:  .  losartan (COZAAR) 100 MG tablet, Take 1 tablet (100 mg total) by mouth daily. (Patient taking differently: Take 50 mg by mouth daily. ), Disp: 90 tablet, Rfl: 3 .  methotrexate (RHEUMATREX) 2.5 MG tablet, Take 10 mg by mouth once a week., Disp: , Rfl:  .  ondansetron (ZOFRAN-ODT) 4 MG  disintegrating tablet, Take 1 tablet (4 mg total) by mouth every 8 (eight) hours as needed for nausea or vomiting., Disp: 30 tablet, Rfl: 0 .  pantoprazole (PROTONIX) 40 MG tablet, Take 1 tablet (40 mg total) by mouth 2 (two) times daily before a meal. (Patient taking differently: Take 40 mg by mouth daily. ), Disp: 60 tablet, Rfl: 0 .  VELTASSA 8.4 g packet, Take 1 packet by mouth daily., Disp: , Rfl: 3     ALLERGIES   Ciprofloxacin hcl; Doxycycline hyclate; and Phenol-glycerin     REVIEW OF SYSTEMS   Review of Systems  Constitutional: Negative for chills, fever, malaise/fatigue and weight loss.  Respiratory: Negative for cough, shortness of breath and wheezing.   Cardiovascular: Negative for chest pain, orthopnea and leg swelling.  Gastrointestinal: Negative for abdominal pain and nausea.     VS: BP (!) 142/88 (BP Location: Left Arm, Cuff Size: Normal)   Pulse 78   Resp 16   Ht 5\' 11"  (1.803 m)   Wt 197 lb (89.4 kg)   SpO2 100%   BMI 27.48 kg/m      PHYSICAL EXAM   Physical Exam  Constitutional: No distress.  Cardiovascular: Normal rate, regular rhythm and normal heart sounds.  No murmur heard. Pulmonary/Chest: Effort normal and breath sounds normal. No respiratory distress. He has no  wheezes.  Abdominal: Soft. Bowel sounds are normal.  Surgical scars clean intact, PD catheter in place  Skin: He is not diaphoretic.          ASSESSMENT/PLAN   51 yo white male with new Dx of Amyloidosis ESRD on HD with chronic cough-resolved with h/o heavy smoking findings c/w very mild COPD based on flow volume loops with slight concavity of end exp flow limitation-no acute complaints at this time  1.albuterol as needed 2.avoid second hand smokle 3.cough suppressants as needed 4.follow up with [facility]   Follow up in 6 months    Patient/Family are satisfied with Plan of action and management. All questions answered   Corrin Parker, M.D.  [facility]  Pulmonary & Critical Care Medicine  Medical Director [place] Director [facility] Cardio-Pulmonary Department

## 2017-05-24 NOTE — Patient Instructions (Signed)
Albuterol as needed 

## 2017-05-25 DIAGNOSIS — Z992 Dependence on renal dialysis: Secondary | ICD-10-CM | POA: Diagnosis not present

## 2017-05-25 DIAGNOSIS — N186 End stage renal disease: Secondary | ICD-10-CM | POA: Diagnosis not present

## 2017-05-26 DIAGNOSIS — Z992 Dependence on renal dialysis: Secondary | ICD-10-CM | POA: Diagnosis not present

## 2017-05-26 DIAGNOSIS — N186 End stage renal disease: Secondary | ICD-10-CM | POA: Diagnosis not present

## 2017-05-27 DIAGNOSIS — N186 End stage renal disease: Secondary | ICD-10-CM | POA: Diagnosis not present

## 2017-05-27 DIAGNOSIS — Z992 Dependence on renal dialysis: Secondary | ICD-10-CM | POA: Diagnosis not present

## 2017-05-28 ENCOUNTER — Telehealth: Payer: Self-pay | Admitting: Cardiovascular Disease

## 2017-05-28 DIAGNOSIS — E854 Organ-limited amyloidosis: Secondary | ICD-10-CM

## 2017-05-28 DIAGNOSIS — I43 Cardiomyopathy in diseases classified elsewhere: Principal | ICD-10-CM

## 2017-05-28 DIAGNOSIS — N186 End stage renal disease: Secondary | ICD-10-CM | POA: Diagnosis not present

## 2017-05-28 DIAGNOSIS — Z992 Dependence on renal dialysis: Secondary | ICD-10-CM | POA: Diagnosis not present

## 2017-05-28 NOTE — Telephone Encounter (Signed)
Patient wants to know if he can have an echo  He needs this for document ion on chart for the transplant list.    Please call to discuss

## 2017-05-28 NOTE — Telephone Encounter (Signed)
Left voicemail message to call back  

## 2017-05-29 DIAGNOSIS — N186 End stage renal disease: Secondary | ICD-10-CM | POA: Diagnosis not present

## 2017-05-29 DIAGNOSIS — Z992 Dependence on renal dialysis: Secondary | ICD-10-CM | POA: Diagnosis not present

## 2017-05-29 NOTE — Telephone Encounter (Signed)
Spoke with patient and he reviewed that he needed repeat echocardiogram due to being on transplant list. He gets testing done yearly to stay up to date for potential transplant surgery. He was previous Dr. Yvone Neu patient and will be seeing Dr. Rockey Situ at his next follow up appointment. Will route to Dr. Rockey Situ for review and approval for repeat echo.

## 2017-05-29 NOTE — Telephone Encounter (Signed)
Okay to order echocardiogram Possible cardiac amyloid Renal failure, on transplant list

## 2017-05-30 DIAGNOSIS — Z992 Dependence on renal dialysis: Secondary | ICD-10-CM | POA: Diagnosis not present

## 2017-05-30 DIAGNOSIS — N186 End stage renal disease: Secondary | ICD-10-CM | POA: Diagnosis not present

## 2017-05-30 NOTE — Telephone Encounter (Signed)
Pt scheduled  Nothing else needed.

## 2017-05-31 DIAGNOSIS — Z992 Dependence on renal dialysis: Secondary | ICD-10-CM | POA: Diagnosis not present

## 2017-05-31 DIAGNOSIS — N186 End stage renal disease: Secondary | ICD-10-CM | POA: Diagnosis not present

## 2017-06-01 DIAGNOSIS — Z992 Dependence on renal dialysis: Secondary | ICD-10-CM | POA: Diagnosis not present

## 2017-06-01 DIAGNOSIS — N186 End stage renal disease: Secondary | ICD-10-CM | POA: Diagnosis not present

## 2017-06-02 DIAGNOSIS — N186 End stage renal disease: Secondary | ICD-10-CM | POA: Diagnosis not present

## 2017-06-02 DIAGNOSIS — Z992 Dependence on renal dialysis: Secondary | ICD-10-CM | POA: Diagnosis not present

## 2017-06-03 DIAGNOSIS — N186 End stage renal disease: Secondary | ICD-10-CM | POA: Diagnosis not present

## 2017-06-03 DIAGNOSIS — Z992 Dependence on renal dialysis: Secondary | ICD-10-CM | POA: Diagnosis not present

## 2017-06-04 DIAGNOSIS — N186 End stage renal disease: Secondary | ICD-10-CM | POA: Diagnosis not present

## 2017-06-04 DIAGNOSIS — Z992 Dependence on renal dialysis: Secondary | ICD-10-CM | POA: Diagnosis not present

## 2017-06-05 DIAGNOSIS — R161 Splenomegaly, not elsewhere classified: Secondary | ICD-10-CM | POA: Insufficient documentation

## 2017-06-05 DIAGNOSIS — Z992 Dependence on renal dialysis: Secondary | ICD-10-CM | POA: Diagnosis not present

## 2017-06-05 DIAGNOSIS — N186 End stage renal disease: Secondary | ICD-10-CM | POA: Diagnosis not present

## 2017-06-05 NOTE — Progress Notes (Signed)
[facility]  [phone]) [phone] Fax:(336) [phone]  ID: Joseph [name] OB: 1966/01/15  MR#: [id number]  OEV#:[id number]  Patient Care Team: Alycia Rossetti, MD as PCP - General (Family Medicine)  CHIEF COMPLAINT: Primary renal amyloidosis, splenomegaly  INTERVAL HISTORY: Patient was last evaluated in clinic over one year ago.  He is referred back for further evaluation of incidental splenomegaly.  Continues to be on the renal transplant list. He is tolerating dialysis well.  He has some fatigue and he has a decreased appetite but no weight loss.  He has no neurologic complaints. He denies any fevers. He denies any chest pain or shortness of breath. He has no abdominal pain. He denies any nausea, vomiting, constipation, or diarrhea. Patient offers no specific complaints today.  REVIEW OF SYSTEMS:   Review of Systems  Constitutional: Negative for fever, malaise/fatigue and weight loss.  Respiratory: Negative.  Negative for cough and shortness of breath.   Cardiovascular: Negative.  Negative for chest pain and leg swelling.  Gastrointestinal: Negative.  Negative for abdominal pain.  Genitourinary: Negative.   Musculoskeletal: Negative.   Neurological: Negative.  Negative for weakness.  Endo/Heme/Allergies: Does not bruise/bleed easily.  Psychiatric/Behavioral: Negative.  The patient is not nervous/anxious.     As per HPI. Otherwise, a complete review of systems is negative.  PAST MEDICAL HISTORY: Past Medical History:  Diagnosis Date  . Blood clot in abdominal vein    happened around age 72 due to traumatic injury  . Blood dyscrasia    amyloidosis  . Central serous chorioretinopathy   . Dry eye   . GERD (gastroesophageal reflux disease)   . Heart murmur    never followed up with this, no problems  . Hyperlipidemia   . Hypertension    improved since starting dialysis  . Pneumonia    as child  . Renal insufficiency     PAST SURGICAL HISTORY: Past  Surgical History:  Procedure Laterality Date  . AORTOGRAM Bilateral 04/11/2015   Procedure: Aortogram;  Surgeon: Katha Cabal, MD;  Location: [facility];  Service: Cardiovascular;  Laterality: Bilateral;  . CAPD INSERTION N/A 07/09/2015   Procedure: LAPAROSCOPIC INSERTION CONTINUOUS AMBULATORY PERITONEAL DIALYSIS  (CAPD) CATHETER;  Surgeon: Katha Cabal, MD;  Location: [facility];  Service: Vascular;  Laterality: N/A;  . COLONOSCOPY WITH PROPOFOL N/A 01/11/2016   Procedure: COLONOSCOPY WITH PROPOFOL;  Surgeon: [name], MD;  Location: [facility];  Service: Endoscopy;  Laterality: N/A;  . EXPLORATORY LAPAROTOMY     done to find and remove abdominal blood clot as a teenager  . IMPLANTATION / PLACEMENT PERMANENT EPIDURAL CATHETER Right 2016  . PERIPHERAL VASCULAR CATHETERIZATION Left 04/11/2015   Procedure: Embolization;  Surgeon: Katha Cabal, MD;  Location: [facility];  Service: Cardiovascular;  Laterality: Left;  . PERIPHERAL VASCULAR CATHETERIZATION N/A 04/16/2015   Procedure: Dialysis/Perma Catheter Insertion;  Surgeon: Katha Cabal, MD;  Location: [facility];  Service: Cardiovascular;  Laterality: N/A;  . PERIPHERAL VASCULAR CATHETERIZATION N/A 08/24/2015   Procedure: Dialysis/Perma Catheter Removal;  Surgeon: Katha Cabal, MD;  Location: [facility];  Service: Cardiovascular;  Laterality: N/A;  . RENAL BIOPSY, PERCUTANEOUS  04/06/2015      . TONSILLECTOMY      FAMILY HISTORY Family History  Problem Relation Age of Onset  . Hypertension Mother   . Birth defects Sister   . Multiple sclerosis Sister   . Heart disease Maternal Uncle   . Early  death Maternal Uncle   . Heart attack Maternal Uncle   . Hypertension Other   . Heart attack Maternal Uncle   . Heart disease Maternal Uncle        ADVANCED DIRECTIVES:    HEALTH MAINTENANCE: Social History   Tobacco Use  . Smoking status: Former Smoker    Packs/day:  0.50    Years: 20.00    Pack years: 10.00    Types: Cigarettes    Last attempt to quit: 11/04/2006    Years since quitting: 10.6  . Smokeless tobacco: Never Used  Substance Use Topics  . Alcohol use: No  . Drug use: No    Allergies  Allergen Reactions  . Ciprofloxacin Hcl Swelling    High fever  . Doxycycline Hyclate Swelling  . Phenol-Glycerin Swelling    Current Outpatient Medications  Medication Sig Dispense Refill  . Artificial Tear Solution (SOOTHE XP OP) Apply 1 drop to eye as needed.    Marland Kitchen atorvastatin (LIPITOR) 20 MG tablet TAKE 1 TABLET BY MOUTH ONCE DAILY 330 tablet 0  . cycloSPORINE (RESTASIS) 0.05 % ophthalmic emulsion Place 1 drop into both eyes daily.     Marland Kitchen Epoetin Alfa (EPOGEN IJ) Inject as directed every 30 (thirty) days. Administered at Dialysis center.    . folic acid (FOLVITE) 1 MG tablet Take 5 mg by mouth daily.    Marland Kitchen gentamicin cream (GARAMYCIN) 0.1 % Apply 1 application topically at bedtime.   0  . Hypromellose (ARTIFICIAL TEARS OP) Apply 1 drop to eye as needed.    Marland Kitchen ketorolac (ACULAR) 0.4 % SOLN Place 1 drop into the right eye 4 times daily.    Marland Kitchen ketorolac (ACULAR) 0.5 % ophthalmic solution   1  . losartan (COZAAR) 100 MG tablet Take 1 tablet (100 mg total) by mouth daily. (Patient taking differently: Take 50 mg by mouth daily. ) 90 tablet 3  . methotrexate (RHEUMATREX) 2.5 MG tablet Take 10 mg by mouth once a week.    . ondansetron (ZOFRAN-ODT) 4 MG disintegrating tablet Take 1 tablet (4 mg total) by mouth every 8 (eight) hours as needed for nausea or vomiting. 30 tablet 0  . pantoprazole (PROTONIX) 40 MG tablet Take 1 tablet (40 mg total) by mouth 2 (two) times daily before a meal. (Patient taking differently: Take 40 mg by mouth daily. ) 60 tablet 0  . prednisoLONE acetate (PRED FORTE) 1 % ophthalmic suspension Place 1 drop into the right eye 2 times daily.    . VELTASSA 8.4 g packet Take 1 packet by mouth daily.  3  . albuterol (PROVENTIL HFA;VENTOLIN  HFA) 108 (90 Base) MCG/ACT inhaler Inhale 2 puffs into the lungs every 6 (six) hours as needed for wheezing or shortness of breath. (Patient not taking: Reported on 06/06/2017) 1 Inhaler 2   No current facility-administered medications for this visit.     OBJECTIVE: Vitals:   06/06/17 1110  BP: (!) 146/97  Pulse: 73  Resp: 18  Temp: 97.8 F (36.6 C)     Body mass index is 27.98 kg/m.    ECOG FS:0 - Asymptomatic  General: Well-developed, well-nourished, no acute distress. Eyes: Pink conjunctiva, anicteric sclera. Lungs: Clear to auscultation bilaterally. Heart: Regular rate and rhythm. No rubs, murmurs, or gallops. Abdomen: Soft, nontender, nondistended. No organomegaly noted, normoactive bowel sounds. Musculoskeletal: No edema, cyanosis, or clubbing. Neuro: Alert, answering all questions appropriately. Cranial nerves grossly intact. Skin: No rashes or petechiae noted. Psych: Normal affect.   LAB RESULTS:  Lab Results  Component Value Date   NA 138 01/30/2017   K 4.5 01/30/2017   CL 107 01/30/2017   CO2 24 01/30/2017   GLUCOSE 146 (H) 01/30/2017   BUN 57 (H) 01/30/2017   CREATININE 6.10 (H) 01/30/2017   CALCIUM 8.3 (L) 01/30/2017   PROT 6.8 01/29/2017   ALBUMIN 3.5 01/29/2017   AST 33 01/29/2017   ALT 55 01/29/2017   ALKPHOS 167 (H) 01/29/2017   BILITOT 0.7 01/29/2017   GFRNONAA 9 (L) 01/30/2017   GFRAA 11 (L) 01/30/2017    Lab Results  Component Value Date   WBC 11.6 (H) 06/06/2017   NEUTROABS 7.8 (H) 06/06/2017   HGB 9.7 (L) 06/06/2017   HCT 29.9 (L) 06/06/2017   MCV 95.2 06/06/2017   PLT 272 06/06/2017   Lab Results  Component Value Date   IRON 162 12/30/2015   TIBC 277 12/30/2015   IRONPCTSAT 58 (H) 12/30/2015    Lab Results  Component Value Date   FERRITIN 503 (H) 12/30/2015     STUDIES: No results found.  ASSESSMENT: Primary renal amyloidosis, unclear if AA or AL subtype. Splenomegaly  PLAN:    1. Primary renal amyloidosis: After  further evaluation, it was determined the patient has a rare genetic form of amyloidosis that is unrelated to plasma cells. By report only 10 families worldwide have this particular type of amyloidosis. Preliminary renal biopsy "does not show evidence for AL amyloid with negative staining for kappa and lambda light chains. Congo red staining, electron microscopy and additional studies to try to identify the molecular origin of the amyloid are also inconclusive."  Patient has a negative SPEP but does have an elevated kappa light chain of unclear significance. Fat pad biopsy confirmed systemic amyloid. Bone marrow biopsy had less than 5% plasma cells and only focal deposits of amyloid.  Because patient's amyloidosis is genetic and unrelated to plasma cells, if he had progressive disease treatment with chemotherapy would not offer any benefit.  Because of this, no further follow-up is necessary in the [facility]. Please refer patient back if there are any questions or concerns.  2. Anemia:  Iron stores, B12, folate, and hemolysis labs were all previously within normal limits. Continue Epogen at dialysis. No intervention is needed at this time. Patient is also taking methotrexate 47m once per week since March 2017. This low dose is unlikely contributing but possible. No follow-up has been scheduled, please refer patient back if he requires additional treatment for his anemia.  3. End-stage renal disease: Secondary to amyloid. Continue dialysis and treatment per nephrology. Patient is currently on the renal transplant list. 4.  Splenomegaly: Mild.  Patient is not symptomatic and he has a normal platelet count.  Possibly related to amyloid infiltrate.  Underlying liver disease also possible.  No intervention is needed.  Would only routinely image if patient were symptomatic.   Approximately 30 minutes was spent in discussion of which greater than 50% was consultation.  Patient expressed understanding and was in  agreement with this plan. He also understands that He can call clinic at any time with any questions, concerns, or complaints.    TLloyd Huger MD   06/09/2017 6:08 PM

## 2017-06-06 ENCOUNTER — Inpatient Hospital Stay: Payer: 59 | Attending: Oncology | Admitting: Oncology

## 2017-06-06 ENCOUNTER — Inpatient Hospital Stay: Payer: 59

## 2017-06-06 VITALS — BP 146/97 | HR 73 | Temp 97.8°F | Resp 18 | Wt 200.6 lb

## 2017-06-06 DIAGNOSIS — Z992 Dependence on renal dialysis: Secondary | ICD-10-CM | POA: Diagnosis not present

## 2017-06-06 DIAGNOSIS — N08 Glomerular disorders in diseases classified elsewhere: Secondary | ICD-10-CM

## 2017-06-06 DIAGNOSIS — K219 Gastro-esophageal reflux disease without esophagitis: Secondary | ICD-10-CM | POA: Diagnosis not present

## 2017-06-06 DIAGNOSIS — Z8781 Personal history of (healed) traumatic fracture: Secondary | ICD-10-CM | POA: Diagnosis not present

## 2017-06-06 DIAGNOSIS — I10 Essential (primary) hypertension: Secondary | ICD-10-CM | POA: Insufficient documentation

## 2017-06-06 DIAGNOSIS — N186 End stage renal disease: Secondary | ICD-10-CM

## 2017-06-06 DIAGNOSIS — R011 Cardiac murmur, unspecified: Secondary | ICD-10-CM | POA: Insufficient documentation

## 2017-06-06 DIAGNOSIS — E8589 Other amyloidosis: Secondary | ICD-10-CM | POA: Insufficient documentation

## 2017-06-06 DIAGNOSIS — R161 Splenomegaly, not elsewhere classified: Secondary | ICD-10-CM

## 2017-06-06 DIAGNOSIS — Z87891 Personal history of nicotine dependence: Secondary | ICD-10-CM | POA: Diagnosis not present

## 2017-06-06 DIAGNOSIS — E854 Organ-limited amyloidosis: Secondary | ICD-10-CM

## 2017-06-06 DIAGNOSIS — Z79899 Other long term (current) drug therapy: Secondary | ICD-10-CM | POA: Insufficient documentation

## 2017-06-06 DIAGNOSIS — E785 Hyperlipidemia, unspecified: Secondary | ICD-10-CM | POA: Insufficient documentation

## 2017-06-06 LAB — CBC WITH DIFFERENTIAL/PLATELET
Basophils Absolute: 0.1 10*3/uL (ref 0–0.1)
Basophils Relative: 1 %
Eosinophils Absolute: 0.8 10*3/uL — ABNORMAL HIGH (ref 0–0.7)
Eosinophils Relative: 7 %
HEMATOCRIT: 29.9 % — AB (ref 40.0–52.0)
HEMOGLOBIN: 9.7 g/dL — AB (ref 13.0–18.0)
LYMPHS ABS: 2.3 10*3/uL (ref 1.0–3.6)
LYMPHS PCT: 20 %
MCH: 30.9 pg (ref 26.0–34.0)
MCHC: 32.4 g/dL (ref 32.0–36.0)
MCV: 95.2 fL (ref 80.0–100.0)
Monocytes Absolute: 0.7 10*3/uL (ref 0.2–1.0)
Monocytes Relative: 6 %
NEUTROS PCT: 66 %
Neutro Abs: 7.8 10*3/uL — ABNORMAL HIGH (ref 1.4–6.5)
Platelets: 272 10*3/uL (ref 150–440)
RBC: 3.14 MIL/uL — AB (ref 4.40–5.90)
RDW: 17.4 % — ABNORMAL HIGH (ref 11.5–14.5)
WBC: 11.6 10*3/uL — AB (ref 3.8–10.6)

## 2017-06-07 DIAGNOSIS — N186 End stage renal disease: Secondary | ICD-10-CM | POA: Diagnosis not present

## 2017-06-07 DIAGNOSIS — Z992 Dependence on renal dialysis: Secondary | ICD-10-CM | POA: Diagnosis not present

## 2017-06-08 DIAGNOSIS — Z992 Dependence on renal dialysis: Secondary | ICD-10-CM | POA: Diagnosis not present

## 2017-06-08 DIAGNOSIS — N186 End stage renal disease: Secondary | ICD-10-CM | POA: Diagnosis not present

## 2017-06-09 DIAGNOSIS — Z992 Dependence on renal dialysis: Secondary | ICD-10-CM | POA: Diagnosis not present

## 2017-06-09 DIAGNOSIS — N186 End stage renal disease: Secondary | ICD-10-CM | POA: Diagnosis not present

## 2017-06-10 DIAGNOSIS — Z992 Dependence on renal dialysis: Secondary | ICD-10-CM | POA: Diagnosis not present

## 2017-06-10 DIAGNOSIS — N186 End stage renal disease: Secondary | ICD-10-CM | POA: Diagnosis not present

## 2017-06-11 DIAGNOSIS — Z992 Dependence on renal dialysis: Secondary | ICD-10-CM | POA: Diagnosis not present

## 2017-06-11 DIAGNOSIS — N186 End stage renal disease: Secondary | ICD-10-CM | POA: Diagnosis not present

## 2017-06-12 ENCOUNTER — Encounter: Payer: Self-pay | Admitting: Oncology

## 2017-06-12 DIAGNOSIS — N186 End stage renal disease: Secondary | ICD-10-CM | POA: Diagnosis not present

## 2017-06-12 DIAGNOSIS — H2511 Age-related nuclear cataract, right eye: Secondary | ICD-10-CM | POA: Diagnosis not present

## 2017-06-12 DIAGNOSIS — Z961 Presence of intraocular lens: Secondary | ICD-10-CM | POA: Diagnosis not present

## 2017-06-12 DIAGNOSIS — Z992 Dependence on renal dialysis: Secondary | ICD-10-CM | POA: Diagnosis not present

## 2017-06-12 DIAGNOSIS — Z79899 Other long term (current) drug therapy: Secondary | ICD-10-CM | POA: Diagnosis not present

## 2017-06-12 DIAGNOSIS — H04129 Dry eye syndrome of unspecified lacrimal gland: Secondary | ICD-10-CM | POA: Diagnosis not present

## 2017-06-12 DIAGNOSIS — H35713 Central serous chorioretinopathy, bilateral: Secondary | ICD-10-CM | POA: Diagnosis not present

## 2017-06-12 DIAGNOSIS — H353231 Exudative age-related macular degeneration, bilateral, with active choroidal neovascularization: Secondary | ICD-10-CM | POA: Diagnosis not present

## 2017-06-12 DIAGNOSIS — H3581 Retinal edema: Secondary | ICD-10-CM | POA: Diagnosis not present

## 2017-06-13 DIAGNOSIS — N186 End stage renal disease: Secondary | ICD-10-CM | POA: Diagnosis not present

## 2017-06-13 DIAGNOSIS — Z992 Dependence on renal dialysis: Secondary | ICD-10-CM | POA: Diagnosis not present

## 2017-06-14 DIAGNOSIS — Z992 Dependence on renal dialysis: Secondary | ICD-10-CM | POA: Diagnosis not present

## 2017-06-14 DIAGNOSIS — N186 End stage renal disease: Secondary | ICD-10-CM | POA: Diagnosis not present

## 2017-06-15 ENCOUNTER — Other Ambulatory Visit: Payer: Self-pay

## 2017-06-15 ENCOUNTER — Ambulatory Visit (INDEPENDENT_AMBULATORY_CARE_PROVIDER_SITE_OTHER): Payer: 59

## 2017-06-15 DIAGNOSIS — E854 Organ-limited amyloidosis: Secondary | ICD-10-CM

## 2017-06-15 DIAGNOSIS — N186 End stage renal disease: Secondary | ICD-10-CM | POA: Diagnosis not present

## 2017-06-15 DIAGNOSIS — I43 Cardiomyopathy in diseases classified elsewhere: Secondary | ICD-10-CM | POA: Diagnosis not present

## 2017-06-15 DIAGNOSIS — Z992 Dependence on renal dialysis: Secondary | ICD-10-CM | POA: Diagnosis not present

## 2017-06-16 DIAGNOSIS — Z992 Dependence on renal dialysis: Secondary | ICD-10-CM | POA: Diagnosis not present

## 2017-06-16 DIAGNOSIS — N186 End stage renal disease: Secondary | ICD-10-CM | POA: Diagnosis not present

## 2017-06-17 DIAGNOSIS — Z992 Dependence on renal dialysis: Secondary | ICD-10-CM | POA: Diagnosis not present

## 2017-06-17 DIAGNOSIS — N186 End stage renal disease: Secondary | ICD-10-CM | POA: Diagnosis not present

## 2017-06-18 DIAGNOSIS — Z992 Dependence on renal dialysis: Secondary | ICD-10-CM | POA: Diagnosis not present

## 2017-06-18 DIAGNOSIS — N186 End stage renal disease: Secondary | ICD-10-CM | POA: Diagnosis not present

## 2017-06-19 DIAGNOSIS — N186 End stage renal disease: Secondary | ICD-10-CM | POA: Diagnosis not present

## 2017-06-19 DIAGNOSIS — Z992 Dependence on renal dialysis: Secondary | ICD-10-CM | POA: Diagnosis not present

## 2017-06-20 DIAGNOSIS — N186 End stage renal disease: Secondary | ICD-10-CM | POA: Diagnosis not present

## 2017-06-20 DIAGNOSIS — Z992 Dependence on renal dialysis: Secondary | ICD-10-CM | POA: Diagnosis not present

## 2017-06-21 DIAGNOSIS — N186 End stage renal disease: Secondary | ICD-10-CM | POA: Diagnosis not present

## 2017-06-21 DIAGNOSIS — Z992 Dependence on renal dialysis: Secondary | ICD-10-CM | POA: Diagnosis not present

## 2017-06-22 DIAGNOSIS — N186 End stage renal disease: Secondary | ICD-10-CM | POA: Diagnosis not present

## 2017-06-22 DIAGNOSIS — Z992 Dependence on renal dialysis: Secondary | ICD-10-CM | POA: Diagnosis not present

## 2017-06-23 DIAGNOSIS — Z992 Dependence on renal dialysis: Secondary | ICD-10-CM | POA: Diagnosis not present

## 2017-06-23 DIAGNOSIS — N186 End stage renal disease: Secondary | ICD-10-CM | POA: Diagnosis not present

## 2017-06-24 DIAGNOSIS — Z992 Dependence on renal dialysis: Secondary | ICD-10-CM | POA: Diagnosis not present

## 2017-06-24 DIAGNOSIS — N186 End stage renal disease: Secondary | ICD-10-CM | POA: Diagnosis not present

## 2017-06-25 DIAGNOSIS — N186 End stage renal disease: Secondary | ICD-10-CM | POA: Diagnosis not present

## 2017-06-25 DIAGNOSIS — Z992 Dependence on renal dialysis: Secondary | ICD-10-CM | POA: Diagnosis not present

## 2017-06-26 DIAGNOSIS — Z992 Dependence on renal dialysis: Secondary | ICD-10-CM | POA: Diagnosis not present

## 2017-06-26 DIAGNOSIS — N186 End stage renal disease: Secondary | ICD-10-CM | POA: Diagnosis not present

## 2017-06-27 DIAGNOSIS — N186 End stage renal disease: Secondary | ICD-10-CM | POA: Diagnosis not present

## 2017-06-27 DIAGNOSIS — Z992 Dependence on renal dialysis: Secondary | ICD-10-CM | POA: Diagnosis not present

## 2017-06-28 DIAGNOSIS — Z992 Dependence on renal dialysis: Secondary | ICD-10-CM | POA: Diagnosis not present

## 2017-06-28 DIAGNOSIS — N186 End stage renal disease: Secondary | ICD-10-CM | POA: Diagnosis not present

## 2017-06-29 DIAGNOSIS — N186 End stage renal disease: Secondary | ICD-10-CM | POA: Diagnosis not present

## 2017-06-29 DIAGNOSIS — Z992 Dependence on renal dialysis: Secondary | ICD-10-CM | POA: Diagnosis not present

## 2017-06-30 DIAGNOSIS — N186 End stage renal disease: Secondary | ICD-10-CM | POA: Diagnosis not present

## 2017-06-30 DIAGNOSIS — Z992 Dependence on renal dialysis: Secondary | ICD-10-CM | POA: Diagnosis not present

## 2017-07-01 DIAGNOSIS — N186 End stage renal disease: Secondary | ICD-10-CM | POA: Diagnosis not present

## 2017-07-01 DIAGNOSIS — Z992 Dependence on renal dialysis: Secondary | ICD-10-CM | POA: Diagnosis not present

## 2017-07-02 ENCOUNTER — Telehealth: Payer: Self-pay | Admitting: Cardiovascular Disease

## 2017-07-02 DIAGNOSIS — N186 End stage renal disease: Secondary | ICD-10-CM | POA: Diagnosis not present

## 2017-07-02 DIAGNOSIS — Z992 Dependence on renal dialysis: Secondary | ICD-10-CM | POA: Diagnosis not present

## 2017-07-02 NOTE — Telephone Encounter (Signed)
Pt would like a copy of his most recent echo on a CD for him to take to the [facility] in [place]. Please call when ready.

## 2017-07-02 NOTE — Telephone Encounter (Signed)
Falecha, Will you please make the CD?   Thanks, Missy

## 2017-07-03 DIAGNOSIS — N186 End stage renal disease: Secondary | ICD-10-CM | POA: Diagnosis not present

## 2017-07-03 DIAGNOSIS — Z01818 Encounter for other preprocedural examination: Secondary | ICD-10-CM | POA: Diagnosis not present

## 2017-07-03 DIAGNOSIS — Z992 Dependence on renal dialysis: Secondary | ICD-10-CM | POA: Diagnosis not present

## 2017-07-04 DIAGNOSIS — N186 End stage renal disease: Secondary | ICD-10-CM | POA: Diagnosis not present

## 2017-07-04 DIAGNOSIS — Z01818 Encounter for other preprocedural examination: Secondary | ICD-10-CM | POA: Diagnosis not present

## 2017-07-04 DIAGNOSIS — Z992 Dependence on renal dialysis: Secondary | ICD-10-CM | POA: Diagnosis not present

## 2017-07-05 DIAGNOSIS — Z01818 Encounter for other preprocedural examination: Secondary | ICD-10-CM | POA: Diagnosis not present

## 2017-07-05 DIAGNOSIS — E7800 Pure hypercholesterolemia, unspecified: Secondary | ICD-10-CM | POA: Diagnosis not present

## 2017-07-05 DIAGNOSIS — N186 End stage renal disease: Secondary | ICD-10-CM | POA: Diagnosis not present

## 2017-07-05 DIAGNOSIS — Z992 Dependence on renal dialysis: Secondary | ICD-10-CM | POA: Diagnosis not present

## 2017-07-06 DIAGNOSIS — N186 End stage renal disease: Secondary | ICD-10-CM | POA: Diagnosis not present

## 2017-07-06 DIAGNOSIS — Z992 Dependence on renal dialysis: Secondary | ICD-10-CM | POA: Diagnosis not present

## 2017-07-07 DIAGNOSIS — Z992 Dependence on renal dialysis: Secondary | ICD-10-CM | POA: Diagnosis not present

## 2017-07-07 DIAGNOSIS — N186 End stage renal disease: Secondary | ICD-10-CM | POA: Diagnosis not present

## 2017-07-08 DIAGNOSIS — Z992 Dependence on renal dialysis: Secondary | ICD-10-CM | POA: Diagnosis not present

## 2017-07-08 DIAGNOSIS — N186 End stage renal disease: Secondary | ICD-10-CM | POA: Diagnosis not present

## 2017-07-09 DIAGNOSIS — Z992 Dependence on renal dialysis: Secondary | ICD-10-CM | POA: Diagnosis not present

## 2017-07-09 DIAGNOSIS — N186 End stage renal disease: Secondary | ICD-10-CM | POA: Diagnosis not present

## 2017-07-10 DIAGNOSIS — Z992 Dependence on renal dialysis: Secondary | ICD-10-CM | POA: Diagnosis not present

## 2017-07-10 DIAGNOSIS — N186 End stage renal disease: Secondary | ICD-10-CM | POA: Diagnosis not present

## 2017-07-11 DIAGNOSIS — N186 End stage renal disease: Secondary | ICD-10-CM | POA: Diagnosis not present

## 2017-07-11 DIAGNOSIS — Z992 Dependence on renal dialysis: Secondary | ICD-10-CM | POA: Diagnosis not present

## 2017-07-12 DIAGNOSIS — N186 End stage renal disease: Secondary | ICD-10-CM | POA: Diagnosis not present

## 2017-07-12 DIAGNOSIS — Z992 Dependence on renal dialysis: Secondary | ICD-10-CM | POA: Diagnosis not present

## 2017-07-13 DIAGNOSIS — N186 End stage renal disease: Secondary | ICD-10-CM | POA: Diagnosis not present

## 2017-07-13 DIAGNOSIS — Z992 Dependence on renal dialysis: Secondary | ICD-10-CM | POA: Diagnosis not present

## 2017-07-14 DIAGNOSIS — Z992 Dependence on renal dialysis: Secondary | ICD-10-CM | POA: Diagnosis not present

## 2017-07-14 DIAGNOSIS — N186 End stage renal disease: Secondary | ICD-10-CM | POA: Diagnosis not present

## 2017-07-15 DIAGNOSIS — N186 End stage renal disease: Secondary | ICD-10-CM | POA: Diagnosis not present

## 2017-07-15 DIAGNOSIS — Z992 Dependence on renal dialysis: Secondary | ICD-10-CM | POA: Diagnosis not present

## 2017-07-16 DIAGNOSIS — Z01810 Encounter for preprocedural cardiovascular examination: Secondary | ICD-10-CM | POA: Diagnosis not present

## 2017-07-16 DIAGNOSIS — Z7682 Awaiting organ transplant status: Secondary | ICD-10-CM | POA: Diagnosis not present

## 2017-07-16 DIAGNOSIS — I10 Essential (primary) hypertension: Secondary | ICD-10-CM | POA: Diagnosis not present

## 2017-07-16 DIAGNOSIS — E785 Hyperlipidemia, unspecified: Secondary | ICD-10-CM | POA: Diagnosis not present

## 2017-07-16 DIAGNOSIS — Z79899 Other long term (current) drug therapy: Secondary | ICD-10-CM | POA: Diagnosis not present

## 2017-07-16 DIAGNOSIS — N186 End stage renal disease: Secondary | ICD-10-CM | POA: Diagnosis not present

## 2017-07-16 DIAGNOSIS — E859 Amyloidosis, unspecified: Secondary | ICD-10-CM | POA: Diagnosis not present

## 2017-07-16 DIAGNOSIS — K7689 Other specified diseases of liver: Secondary | ICD-10-CM | POA: Diagnosis not present

## 2017-07-16 DIAGNOSIS — N281 Cyst of kidney, acquired: Secondary | ICD-10-CM | POA: Diagnosis not present

## 2017-07-16 DIAGNOSIS — K8020 Calculus of gallbladder without cholecystitis without obstruction: Secondary | ICD-10-CM | POA: Diagnosis not present

## 2017-07-16 DIAGNOSIS — Z992 Dependence on renal dialysis: Secondary | ICD-10-CM | POA: Diagnosis not present

## 2017-07-16 DIAGNOSIS — E8581 Light chain (AL) amyloidosis: Secondary | ICD-10-CM | POA: Diagnosis not present

## 2017-07-17 DIAGNOSIS — N186 End stage renal disease: Secondary | ICD-10-CM | POA: Diagnosis not present

## 2017-07-17 DIAGNOSIS — Z992 Dependence on renal dialysis: Secondary | ICD-10-CM | POA: Diagnosis not present

## 2017-07-18 DIAGNOSIS — Z992 Dependence on renal dialysis: Secondary | ICD-10-CM | POA: Diagnosis not present

## 2017-07-18 DIAGNOSIS — N186 End stage renal disease: Secondary | ICD-10-CM | POA: Diagnosis not present

## 2017-07-19 DIAGNOSIS — Z992 Dependence on renal dialysis: Secondary | ICD-10-CM | POA: Diagnosis not present

## 2017-07-19 DIAGNOSIS — N186 End stage renal disease: Secondary | ICD-10-CM | POA: Diagnosis not present

## 2017-07-20 DIAGNOSIS — N186 End stage renal disease: Secondary | ICD-10-CM | POA: Diagnosis not present

## 2017-07-20 DIAGNOSIS — Z992 Dependence on renal dialysis: Secondary | ICD-10-CM | POA: Diagnosis not present

## 2017-07-21 DIAGNOSIS — Z992 Dependence on renal dialysis: Secondary | ICD-10-CM | POA: Diagnosis not present

## 2017-07-21 DIAGNOSIS — N186 End stage renal disease: Secondary | ICD-10-CM | POA: Diagnosis not present

## 2017-07-22 DIAGNOSIS — N186 End stage renal disease: Secondary | ICD-10-CM | POA: Diagnosis not present

## 2017-07-22 DIAGNOSIS — Z992 Dependence on renal dialysis: Secondary | ICD-10-CM | POA: Diagnosis not present

## 2017-07-23 DIAGNOSIS — N186 End stage renal disease: Secondary | ICD-10-CM | POA: Diagnosis not present

## 2017-07-23 DIAGNOSIS — Z992 Dependence on renal dialysis: Secondary | ICD-10-CM | POA: Diagnosis not present

## 2017-07-24 DIAGNOSIS — Z992 Dependence on renal dialysis: Secondary | ICD-10-CM | POA: Diagnosis not present

## 2017-07-24 DIAGNOSIS — N186 End stage renal disease: Secondary | ICD-10-CM | POA: Diagnosis not present

## 2017-07-24 DIAGNOSIS — H35371 Puckering of macula, right eye: Secondary | ICD-10-CM | POA: Diagnosis not present

## 2017-07-24 DIAGNOSIS — H353231 Exudative age-related macular degeneration, bilateral, with active choroidal neovascularization: Secondary | ICD-10-CM | POA: Diagnosis not present

## 2017-07-24 DIAGNOSIS — H35713 Central serous chorioretinopathy, bilateral: Secondary | ICD-10-CM | POA: Diagnosis not present

## 2017-07-25 DIAGNOSIS — N186 End stage renal disease: Secondary | ICD-10-CM | POA: Diagnosis not present

## 2017-07-25 DIAGNOSIS — Z992 Dependence on renal dialysis: Secondary | ICD-10-CM | POA: Diagnosis not present

## 2017-07-26 DIAGNOSIS — N281 Cyst of kidney, acquired: Secondary | ICD-10-CM | POA: Diagnosis not present

## 2017-07-26 DIAGNOSIS — N186 End stage renal disease: Secondary | ICD-10-CM | POA: Diagnosis not present

## 2017-07-26 DIAGNOSIS — I1311 Hypertensive heart and chronic kidney disease without heart failure, with stage 5 chronic kidney disease, or end stage renal disease: Secondary | ICD-10-CM | POA: Diagnosis not present

## 2017-07-26 DIAGNOSIS — Z992 Dependence on renal dialysis: Secondary | ICD-10-CM | POA: Diagnosis not present

## 2017-07-27 DIAGNOSIS — Z992 Dependence on renal dialysis: Secondary | ICD-10-CM | POA: Diagnosis not present

## 2017-07-27 DIAGNOSIS — N186 End stage renal disease: Secondary | ICD-10-CM | POA: Diagnosis not present

## 2017-07-28 DIAGNOSIS — N186 End stage renal disease: Secondary | ICD-10-CM | POA: Diagnosis not present

## 2017-07-28 DIAGNOSIS — Z992 Dependence on renal dialysis: Secondary | ICD-10-CM | POA: Diagnosis not present

## 2017-07-29 DIAGNOSIS — N186 End stage renal disease: Secondary | ICD-10-CM | POA: Diagnosis not present

## 2017-07-29 DIAGNOSIS — Z992 Dependence on renal dialysis: Secondary | ICD-10-CM | POA: Diagnosis not present

## 2017-07-30 DIAGNOSIS — Z992 Dependence on renal dialysis: Secondary | ICD-10-CM | POA: Diagnosis not present

## 2017-07-30 DIAGNOSIS — N186 End stage renal disease: Secondary | ICD-10-CM | POA: Diagnosis not present

## 2017-07-31 ENCOUNTER — Other Ambulatory Visit: Payer: Self-pay | Admitting: Urology

## 2017-07-31 DIAGNOSIS — N186 End stage renal disease: Secondary | ICD-10-CM | POA: Diagnosis not present

## 2017-07-31 DIAGNOSIS — N281 Cyst of kidney, acquired: Secondary | ICD-10-CM

## 2017-07-31 DIAGNOSIS — Z992 Dependence on renal dialysis: Secondary | ICD-10-CM | POA: Diagnosis not present

## 2017-08-01 DIAGNOSIS — N186 End stage renal disease: Secondary | ICD-10-CM | POA: Diagnosis not present

## 2017-08-01 DIAGNOSIS — Z992 Dependence on renal dialysis: Secondary | ICD-10-CM | POA: Diagnosis not present

## 2017-08-02 ENCOUNTER — Ambulatory Visit
Admission: RE | Admit: 2017-08-02 | Discharge: 2017-08-02 | Disposition: A | Discharge status: Home / Self Care | Payer: 59 | Source: Ambulatory Visit | Attending: Urology | Admitting: Urology

## 2017-08-02 DIAGNOSIS — N281 Cyst of kidney, acquired: Secondary | ICD-10-CM | POA: Diagnosis not present

## 2017-08-02 DIAGNOSIS — N186 End stage renal disease: Secondary | ICD-10-CM | POA: Diagnosis not present

## 2017-08-02 DIAGNOSIS — Z992 Dependence on renal dialysis: Secondary | ICD-10-CM | POA: Diagnosis not present

## 2017-08-03 DIAGNOSIS — Z992 Dependence on renal dialysis: Secondary | ICD-10-CM | POA: Diagnosis not present

## 2017-08-03 DIAGNOSIS — N186 End stage renal disease: Secondary | ICD-10-CM | POA: Diagnosis not present

## 2017-08-04 DIAGNOSIS — Z992 Dependence on renal dialysis: Secondary | ICD-10-CM | POA: Diagnosis not present

## 2017-08-04 DIAGNOSIS — N186 End stage renal disease: Secondary | ICD-10-CM | POA: Diagnosis not present

## 2017-08-05 DIAGNOSIS — N186 End stage renal disease: Secondary | ICD-10-CM | POA: Diagnosis not present

## 2017-08-05 DIAGNOSIS — Z992 Dependence on renal dialysis: Secondary | ICD-10-CM | POA: Diagnosis not present

## 2017-08-06 ENCOUNTER — Encounter: Payer: Self-pay | Admitting: Emergency Medicine

## 2017-08-06 ENCOUNTER — Inpatient Hospital Stay
Admission: EM | Admit: 2017-08-06 | Discharge: 2017-08-09 | DRG: 545 | Disposition: A | Discharge status: Home / Self Care | Payer: 59 | Attending: Internal Medicine | Admitting: Internal Medicine

## 2017-08-06 ENCOUNTER — Other Ambulatory Visit: Payer: Self-pay

## 2017-08-06 ENCOUNTER — Emergency Department: Payer: 59

## 2017-08-06 ENCOUNTER — Observation Stay: Payer: 59

## 2017-08-06 DIAGNOSIS — R0789 Other chest pain: Secondary | ICD-10-CM

## 2017-08-06 DIAGNOSIS — K219 Gastro-esophageal reflux disease without esophagitis: Secondary | ICD-10-CM | POA: Diagnosis not present

## 2017-08-06 DIAGNOSIS — I10 Essential (primary) hypertension: Secondary | ICD-10-CM | POA: Diagnosis not present

## 2017-08-06 DIAGNOSIS — Z79899 Other long term (current) drug therapy: Secondary | ICD-10-CM

## 2017-08-06 DIAGNOSIS — M25511 Pain in right shoulder: Secondary | ICD-10-CM | POA: Diagnosis not present

## 2017-08-06 DIAGNOSIS — Z87891 Personal history of nicotine dependence: Secondary | ICD-10-CM

## 2017-08-06 DIAGNOSIS — Z7682 Awaiting organ transplant status: Secondary | ICD-10-CM

## 2017-08-06 DIAGNOSIS — E875 Hyperkalemia: Secondary | ICD-10-CM | POA: Diagnosis not present

## 2017-08-06 DIAGNOSIS — K5900 Constipation, unspecified: Secondary | ICD-10-CM | POA: Diagnosis not present

## 2017-08-06 DIAGNOSIS — E2749 Other adrenocortical insufficiency: Secondary | ICD-10-CM | POA: Diagnosis not present

## 2017-08-06 DIAGNOSIS — R52 Pain, unspecified: Secondary | ICD-10-CM

## 2017-08-06 DIAGNOSIS — K8020 Calculus of gallbladder without cholecystitis without obstruction: Secondary | ICD-10-CM | POA: Diagnosis not present

## 2017-08-06 DIAGNOSIS — R079 Chest pain, unspecified: Secondary | ICD-10-CM | POA: Diagnosis not present

## 2017-08-06 DIAGNOSIS — R109 Unspecified abdominal pain: Secondary | ICD-10-CM

## 2017-08-06 DIAGNOSIS — D631 Anemia in chronic kidney disease: Secondary | ICD-10-CM | POA: Diagnosis not present

## 2017-08-06 DIAGNOSIS — E785 Hyperlipidemia, unspecified: Secondary | ICD-10-CM | POA: Diagnosis present

## 2017-08-06 DIAGNOSIS — Z992 Dependence on renal dialysis: Secondary | ICD-10-CM

## 2017-08-06 DIAGNOSIS — N2581 Secondary hyperparathyroidism of renal origin: Secondary | ICD-10-CM | POA: Diagnosis not present

## 2017-08-06 DIAGNOSIS — R1012 Left upper quadrant pain: Secondary | ICD-10-CM | POA: Diagnosis not present

## 2017-08-06 DIAGNOSIS — E859 Amyloidosis, unspecified: Secondary | ICD-10-CM | POA: Diagnosis not present

## 2017-08-06 DIAGNOSIS — J9811 Atelectasis: Secondary | ICD-10-CM | POA: Diagnosis not present

## 2017-08-06 DIAGNOSIS — Z888 Allergy status to other drugs, medicaments and biological substances status: Secondary | ICD-10-CM

## 2017-08-06 DIAGNOSIS — H3500 Unspecified background retinopathy: Secondary | ICD-10-CM | POA: Diagnosis present

## 2017-08-06 DIAGNOSIS — N186 End stage renal disease: Secondary | ICD-10-CM | POA: Diagnosis not present

## 2017-08-06 HISTORY — DX: Amyloidosis, unspecified: E85.9

## 2017-08-06 LAB — TROPONIN I

## 2017-08-06 LAB — COMPREHENSIVE METABOLIC PANEL
ALT: 30 U/L (ref 17–63)
ANION GAP: 11 (ref 5–15)
AST: 26 U/L (ref 15–41)
Albumin: 2.9 g/dL — ABNORMAL LOW (ref 3.5–5.0)
Alkaline Phosphatase: 147 U/L — ABNORMAL HIGH (ref 38–126)
BILIRUBIN TOTAL: 0.6 mg/dL (ref 0.3–1.2)
BUN: 39 mg/dL — ABNORMAL HIGH (ref 6–20)
CALCIUM: 8.4 mg/dL — AB (ref 8.9–10.3)
CO2: 21 mmol/L — ABNORMAL LOW (ref 22–32)
Chloride: 110 mmol/L (ref 101–111)
Creatinine, Ser: 3.93 mg/dL — ABNORMAL HIGH (ref 0.61–1.24)
GFR calc non Af Amer: 16 mL/min — ABNORMAL LOW (ref >60)
GFR, EST AFRICAN AMERICAN: 18 mL/min — AB (ref >60)
Glucose, Bld: 172 mg/dL — ABNORMAL HIGH (ref 65–99)
Potassium: 3.2 mmol/L — ABNORMAL LOW (ref 3.5–5.1)
Sodium: 142 mmol/L (ref 135–145)
TOTAL PROTEIN: 5.9 g/dL — AB (ref 6.5–8.1)

## 2017-08-06 LAB — CBC WITH DIFFERENTIAL/PLATELET
Basophils Absolute: 0.2 10*3/uL — ABNORMAL HIGH (ref 0–0.1)
Basophils Relative: 2 %
EOS ABS: 0.9 10*3/uL — AB (ref 0–0.7)
EOS PCT: 8 %
HCT: 31.8 % — ABNORMAL LOW (ref 40.0–52.0)
Hemoglobin: 10.3 g/dL — ABNORMAL LOW (ref 13.0–18.0)
Lymphocytes Relative: 21 %
Lymphs Abs: 2.3 10*3/uL (ref 1.0–3.6)
MCH: 32.1 pg (ref 26.0–34.0)
MCHC: 32.3 g/dL (ref 32.0–36.0)
MCV: 99.4 fL (ref 80.0–100.0)
Monocytes Absolute: 0.9 10*3/uL (ref 0.2–1.0)
Monocytes Relative: 8 %
Neutro Abs: 6.7 10*3/uL — ABNORMAL HIGH (ref 1.4–6.5)
Neutrophils Relative %: 61 %
PLATELETS: 324 10*3/uL (ref 150–440)
RBC: 3.2 MIL/uL — AB (ref 4.40–5.90)
RDW: 21.5 % — AB (ref 11.5–14.5)
WBC: 10.9 10*3/uL — AB (ref 3.8–10.6)

## 2017-08-06 LAB — BRAIN NATRIURETIC PEPTIDE: B Natriuretic Peptide: 66 pg/mL (ref 0.0–100.0)

## 2017-08-06 LAB — MRSA PCR SCREENING: MRSA by PCR: NEGATIVE

## 2017-08-06 LAB — TSH: TSH: 1.796 u[IU]/mL (ref 0.350–4.500)

## 2017-08-06 MED ORDER — ACETAMINOPHEN 325 MG PO TABS
650.0000 mg | ORAL_TABLET | Freq: Four times a day (QID) | ORAL | Status: DC | PRN
  Administered 2017-08-08 – 2017-08-09 (×2): 650 mg via ORAL
  Filled 2017-08-06 (×2): qty 2

## 2017-08-06 MED ORDER — LOSARTAN POTASSIUM 50 MG PO TABS
100.0000 mg | ORAL_TABLET | Freq: Every day | ORAL | Status: DC
  Administered 2017-08-06 – 2017-08-07 (×2): 100 mg via ORAL
  Administered 2017-08-08 – 2017-08-09 (×2): 50 mg via ORAL
  Filled 2017-08-06 (×4): qty 2

## 2017-08-06 MED ORDER — SODIUM CHLORIDE 0.9 % IV SOLN
INTRAVENOUS | Status: DC
  Administered 2017-08-06 – 2017-08-08 (×4): via INTRAVENOUS

## 2017-08-06 MED ORDER — ONDANSETRON HCL 4 MG/2ML IJ SOLN
4.0000 mg | Freq: Four times a day (QID) | INTRAMUSCULAR | Status: DC | PRN
  Administered 2017-08-06 – 2017-08-07 (×2): 4 mg via INTRAVENOUS
  Filled 2017-08-06 (×3): qty 2

## 2017-08-06 MED ORDER — HYDROMORPHONE HCL 1 MG/ML IJ SOLN
1.0000 mg | INTRAMUSCULAR | Status: DC | PRN
  Administered 2017-08-06 – 2017-08-09 (×8): 1 mg via INTRAVENOUS
  Filled 2017-08-06 (×9): qty 1

## 2017-08-06 MED ORDER — GENTAMICIN SULFATE 0.1 % EX CREA
1.0000 "application " | TOPICAL_CREAM | Freq: Every day | CUTANEOUS | Status: DC
  Administered 2017-08-07 – 2017-08-09 (×3): 1 via TOPICAL
  Filled 2017-08-06: qty 15

## 2017-08-06 MED ORDER — AFLIBERCEPT 2 MG/0.05ML IZ SOLN: 1.0000 | INTRAVITREAL | Status: DC

## 2017-08-06 MED ORDER — ALBUTEROL SULFATE HFA 108 (90 BASE) MCG/ACT IN AERS: 2.0000 | INHALATION_SPRAY | Freq: Four times a day (QID) | RESPIRATORY_TRACT | Status: DC | PRN

## 2017-08-06 MED ORDER — ONDANSETRON 4 MG PO TBDP
4.0000 mg | ORAL_TABLET | Freq: Three times a day (TID) | ORAL | Status: DC | PRN
  Filled 2017-08-06: qty 1

## 2017-08-06 MED ORDER — ALBUTEROL SULFATE (2.5 MG/3ML) 0.083% IN NEBU: 2.5000 mg | INHALATION_SOLUTION | Freq: Four times a day (QID) | RESPIRATORY_TRACT | Status: DC | PRN

## 2017-08-06 MED ORDER — OXYCODONE HCL 5 MG PO TABS
5.0000 mg | ORAL_TABLET | ORAL | Status: DC | PRN
  Administered 2017-08-06 – 2017-08-07 (×2): 5 mg via ORAL
  Filled 2017-08-06 (×2): qty 1

## 2017-08-06 MED ORDER — ACETAMINOPHEN 650 MG RE SUPP: 650.0000 mg | Freq: Four times a day (QID) | RECTAL | Status: DC | PRN

## 2017-08-06 MED ORDER — IOPAMIDOL (ISOVUE-300) INJECTION 61%
15.0000 mL | INTRAVENOUS | Status: AC
  Administered 2017-08-06: 15 mL via ORAL

## 2017-08-06 MED ORDER — DELFLEX-LC/2.5% DEXTROSE 394 MOSM/L IP SOLN
INTRAPERITONEAL | Status: DC
  Filled 2017-08-06 (×2): qty 3000

## 2017-08-06 MED ORDER — HYDROMORPHONE HCL 1 MG/ML IJ SOLN
1.0000 mg | Freq: Once | INTRAMUSCULAR | Status: AC
  Administered 2017-08-06: 1 mg via INTRAVENOUS
  Filled 2017-08-06: qty 1

## 2017-08-06 MED ORDER — POLYVINYL ALCOHOL 1.4 % OP SOLN
2.0000 [drp] | OPHTHALMIC | Status: DC | PRN
  Filled 2017-08-06: qty 15

## 2017-08-06 MED ORDER — ONDANSETRON HCL 4 MG PO TABS: 4.0000 mg | ORAL_TABLET | Freq: Four times a day (QID) | ORAL | Status: DC | PRN

## 2017-08-06 MED ORDER — PATIROMER SORBITEX CALCIUM 8.4 G PO PACK
1.0000 | PACK | Freq: Every day | ORAL | Status: DC
  Administered 2017-08-06 – 2017-08-09 (×3): 1 via ORAL
  Filled 2017-08-06 (×4): qty 4

## 2017-08-06 MED ORDER — HYDRALAZINE HCL 20 MG/ML IJ SOLN: 10.0000 mg | Freq: Four times a day (QID) | INTRAMUSCULAR | Status: DC | PRN

## 2017-08-06 MED ORDER — ATORVASTATIN CALCIUM 20 MG PO TABS
20.0000 mg | ORAL_TABLET | Freq: Every day | ORAL | Status: DC
  Administered 2017-08-06 – 2017-08-09 (×4): 20 mg via ORAL
  Filled 2017-08-06 (×4): qty 1

## 2017-08-06 MED ORDER — ONDANSETRON HCL 4 MG/2ML IJ SOLN
4.0000 mg | Freq: Once | INTRAMUSCULAR | Status: AC
  Administered 2017-08-06: 4 mg via INTRAVENOUS
  Filled 2017-08-06: qty 2

## 2017-08-06 MED ORDER — ONDANSETRON HCL 4 MG/2ML IJ SOLN
4.0000 mg | Freq: Once | INTRAMUSCULAR | Status: AC
  Administered 2017-08-06: 4 mg via INTRAVENOUS

## 2017-08-06 MED ORDER — GENTAMICIN SULFATE 0.1 % EX CREA
1.0000 "application " | TOPICAL_CREAM | Freq: Every day | CUTANEOUS | Status: DC
  Administered 2017-08-06: 1 via TOPICAL
  Filled 2017-08-06: qty 15

## 2017-08-06 MED ORDER — FOLIC ACID 1 MG PO TABS
5.0000 mg | ORAL_TABLET | Freq: Every day | ORAL | Status: DC
  Administered 2017-08-06 – 2017-08-09 (×4): 5 mg via ORAL
  Filled 2017-08-06 (×5): qty 5

## 2017-08-06 MED ORDER — HYDROMORPHONE HCL 1 MG/ML IJ SOLN
1.0000 mg | Freq: Once | INTRAMUSCULAR | Status: AC
  Administered 2017-08-06: 1 mg via INTRAVENOUS

## 2017-08-06 MED ORDER — MORPHINE SULFATE (PF) 4 MG/ML IV SOLN
4.0000 mg | Freq: Once | INTRAVENOUS | Status: AC
  Administered 2017-08-06: 4 mg via INTRAVENOUS
  Filled 2017-08-06: qty 1

## 2017-08-06 MED ORDER — METHOTREXATE 2.5 MG PO TABS
10.0000 mg | ORAL_TABLET | ORAL | Status: DC
  Administered 2017-08-06: 10 mg via ORAL
  Filled 2017-08-06: qty 4

## 2017-08-06 MED ORDER — PROMETHAZINE HCL 25 MG/ML IJ SOLN
25.0000 mg | Freq: Four times a day (QID) | INTRAMUSCULAR | Status: DC | PRN
  Administered 2017-08-06 – 2017-08-07 (×3): 25 mg via INTRAVENOUS
  Filled 2017-08-06 (×3): qty 1

## 2017-08-06 MED ORDER — PANTOPRAZOLE SODIUM 40 MG PO TBEC
40.0000 mg | DELAYED_RELEASE_TABLET | Freq: Two times a day (BID) | ORAL | Status: DC
  Administered 2017-08-06 – 2017-08-09 (×6): 40 mg via ORAL
  Filled 2017-08-06 (×6): qty 1

## 2017-08-06 NOTE — Progress Notes (Signed)
Pre PD assessment 

## 2017-08-06 NOTE — ED Provider Notes (Signed)
[facility] Emergency Department Provider Note  ____________________________________________   First MD Initiated Contact with Patient 08/06/17 [phone]     (approximate)  I have reviewed the triage vital signs and the nursing notes.   HISTORY  Chief Complaint Chest Pain   HPI Joseph [name] is a 52 y.o. male who comes the emergency department with severe sharp aching right sided chest pain that began shortly after breakfast this morning.  The pain began initially with right upper quadrant discomfort that radiated up to his right chest.  It is associated with shortness of breath.  Patient has a complex past medical history including amyloidosis as well as end-stage renal disease for which he does daily peritoneal dialysis.  He has no history of coronary artery disease.  He does have a history of dyslipidemia and hypertension.  He denies leg swelling.  He did have a recent viral illness.  The symptoms seem to be somewhat worse when lying flat and somewhat improved when sitting up.  Past Medical History:  Diagnosis Date  . Blood clot in abdominal vein    happened around age 78 due to traumatic injury  . Blood dyscrasia    amyloidosis  . Central serous chorioretinopathy   . Dry eye   . GERD (gastroesophageal reflux disease)   . Heart murmur    never followed up with this, no problems  . Hyperlipidemia   . Hypertension    improved since starting dialysis  . Pneumonia    as child  . Renal insufficiency     Patient Active Problem List   Diagnosis Date Noted  . Chest pain 08/06/2017  . Splenomegaly 06/05/2017  . Sepsis ([place]) 01/29/2017  . Right lower lobe pneumonia ([place]) 01/29/2017  . ESRD on peritoneal dialysis ([place]) 09/12/2015  . Central serous chorioretinopathy of both eyes 09/12/2015  . Hyperlipidemia 09/12/2015  . GERD (gastroesophageal reflux disease) 09/12/2015  . Hepatitis B vaccination not up to date 09/12/2015  . Renal amyloidosis ([place])  08/10/2015  . COPD (chronic obstructive pulmonary disease) ([place]) 06/02/2015  . Perinephric hematoma 04/07/2015  . Essential hypertension 04/06/2015    Past Surgical History:  Procedure Laterality Date  . AORTOGRAM Bilateral 04/11/2015   Procedure: Aortogram;  Surgeon: Katha Cabal, MD;  Location: [facility];  Service: Cardiovascular;  Laterality: Bilateral;  . CAPD INSERTION N/A 07/09/2015   Procedure: LAPAROSCOPIC INSERTION CONTINUOUS AMBULATORY PERITONEAL DIALYSIS  (CAPD) CATHETER;  Surgeon: Katha Cabal, MD;  Location: [facility];  Service: Vascular;  Laterality: N/A;  . COLONOSCOPY WITH PROPOFOL N/A 01/11/2016   Procedure: COLONOSCOPY WITH PROPOFOL;  Surgeon: [name], MD;  Location: [facility];  Service: Endoscopy;  Laterality: N/A;  . EXPLORATORY LAPAROTOMY     done to find and remove abdominal blood clot as a teenager  . IMPLANTATION / PLACEMENT PERMANENT EPIDURAL CATHETER Right 2016  . PERIPHERAL VASCULAR CATHETERIZATION Left 04/11/2015   Procedure: Embolization;  Surgeon: Katha Cabal, MD;  Location: [facility];  Service: Cardiovascular;  Laterality: Left;  . PERIPHERAL VASCULAR CATHETERIZATION N/A 04/16/2015   Procedure: Dialysis/Perma Catheter Insertion;  Surgeon: Katha Cabal, MD;  Location: [place] [facility];  Service: Cardiovascular;  Laterality: N/A;  . PERIPHERAL VASCULAR CATHETERIZATION N/A 08/24/2015   Procedure: Dialysis/Perma Catheter Removal;  Surgeon: Katha Cabal, MD;  Location: [facility];  Service: Cardiovascular;  Laterality: N/A;  . RENAL BIOPSY, PERCUTANEOUS  04/06/2015      . TONSILLECTOMY  Prior to Admission medications   Medication Sig Start Date End Date Taking? Authorizing Provider  Aflibercept (EYLEA) 2 MG/0.05ML SOLN 1 Dose by Intravitreal route every 30 (thirty) days.   Yes [provider]  albuterol (PROVENTIL HFA;VENTOLIN HFA) 108 (90 Base) MCG/ACT inhaler Inhale 2 puffs into  the lungs every 6 (six) hours as needed for wheezing or shortness of breath. 05/24/17  Yes Kasa, Maretta Bees, MD  atorvastatin (LIPITOR) 20 MG tablet TAKE 1 TABLET BY MOUTH ONCE DAILY 09/11/16  Yes [place], Modena Nunnery, MD  CEPHALEXIN PO Take 1 capsule by mouth daily as needed (prior to appointment).   Yes [provider]  cycloSPORINE (RESTASIS) 0.05 % ophthalmic emulsion Place 1 drop into both eyes daily.    Yes [provider]  Epoetin Alfa (EPOGEN IJ) Inject as directed every 30 (thirty) days. Administered at Dialysis center.   Yes [provider]  folic acid (FOLVITE) 1 MG tablet Take 5 mg by mouth daily. 03/14/16  Yes [provider]  gentamicin cream (GARAMYCIN) 0.1 % Apply 1 application topically at bedtime.  12/31/15  Yes [provider]  Hypromellose (ARTIFICIAL TEARS OP) Apply 1 drop to eye as needed.   Yes [provider]  losartan (COZAAR) 100 MG tablet Take 1 tablet (100 mg total) by mouth daily. 11/16/15  Yes Snead, Modena Nunnery, MD  methotrexate (RHEUMATREX) 2.5 MG tablet Take 10 mg by mouth once a week. 03/14/16  Yes [provider]  ondansetron (ZOFRAN-ODT) 4 MG disintegrating tablet Take 1 tablet (4 mg total) by mouth every 8 (eight) hours as needed for nausea or vomiting. 04/21/15  Yes Vaughan Basta, MD  pantoprazole (PROTONIX) 40 MG tablet Take 1 tablet (40 mg total) by mouth 2 (two) times daily before a meal. Patient taking differently: Take 40 mg by mouth daily.  04/21/15  Yes Vaughan Basta, MD  VELTASSA 8.4 g packet Take 1 packet by mouth daily. 12/20/15  Yes [provider]    Allergies Ciprofloxacin hcl; Doxycycline hyclate; and Phenol-glycerin  Family History  Problem Relation Age of Onset  . Hypertension Mother   . Birth defects Sister   . Multiple sclerosis Sister   . Heart disease Maternal Uncle   . Early death Maternal Uncle   . Heart attack Maternal Uncle   . Hypertension Other   .  Heart attack Maternal Uncle   . Heart disease Maternal Uncle     Social History Social History   Tobacco Use  . Smoking status: Former Smoker    Packs/day: 0.50    Years: 20.00    Pack years: 10.00    Types: Cigarettes    Last attempt to quit: 11/04/2006    Years since quitting: 10.7  . Smokeless tobacco: Never Used  Substance Use Topics  . Alcohol use: No  . Drug use: No    Review of Systems Constitutional: No fever/chills Eyes: No visual changes. ENT: No sore throat. Cardiovascular: Positive for chest pain. Respiratory: Positive for shortness of breath. Gastrointestinal: Positive for abdominal pain.  Positive for nausea, no vomiting.  No diarrhea.  No constipation. Genitourinary: Negative for dysuria. Musculoskeletal: Negative for back pain. Skin: Negative for rash. Neurological: Negative for headaches, focal weakness or numbness.   ____________________________________________   PHYSICAL EXAM:  VITAL SIGNS: ED Triage Vitals  Enc Vitals Group     BP      Pulse      Resp      Temp      Temp src  SpO2      Weight      Height      Head Circumference      Peak Flow      Pain Score      Pain Loc      Pain Edu?      Excl. in [place]?     Constitutional: Alert and oriented x4 appears extremely uncomfortable clutching his right chest and tearful Eyes: PERRL EOMI. Head: Atraumatic. Nose: No congestion/rhinnorhea. Mouth/Throat: No trismus Neck: No stridor.  Able to lie completely flat with no JVD Cardiovascular: Normal rate, regular rhythm. Grossly normal heart sounds.  Good peripheral circulation. Respiratory: Increased respiratory effort.  No retractions. Lungs CTAB and moving good air Gastrointestinal: Peritoneal dialysis catheter in place.  Soft abdomen nontender Musculoskeletal: No lower extremity edema legs equal in size Neurologic:  Normal speech and language. No gross focal neurologic deficits are appreciated. Skin:  Skin is warm, dry and intact. No  rash noted. Psychiatric: Mood and affect are normal. Speech and behavior are normal.    ____________________________________________   DIFFERENTIAL includes but not limited to  Acute coronary syndrome, myocarditis, pericarditis, pulmonary edema, pulmonary embolism, aortic dissection ____________________________________________   LABS (all labs ordered are listed, but only abnormal results are displayed)  Labs Reviewed  COMPREHENSIVE METABOLIC PANEL - Abnormal; Notable for the following components:      Result Value   Potassium 3.2 (*)    CO2 21 (*)    Glucose, Bld 172 (*)    BUN 39 (*)    Creatinine, Ser 3.93 (*)    Calcium 8.4 (*)    Total Protein 5.9 (*)    Albumin 2.9 (*)    Alkaline Phosphatase 147 (*)    GFR calc non Af Amer 16 (*)    GFR calc Af Amer 18 (*)    All other components within normal limits  CBC WITH DIFFERENTIAL/PLATELET - Abnormal; Notable for the following components:   WBC 10.9 (*)    RBC 3.20 (*)    Hemoglobin 10.3 (*)    HCT 31.8 (*)    RDW 21.5 (*)    Neutro Abs 6.7 (*)    Eosinophils Absolute 0.9 (*)    Basophils Absolute 0.2 (*)    All other components within normal limits  BRAIN NATRIURETIC PEPTIDE  TROPONIN I  TSH  TROPONIN I  TROPONIN I    Lab work reviewed by me with elevated white count which is nonspecific.  Low CO2 consistent with metabolic acidosis of unclear etiology __________________________________________  EKG  ED ECG REPORT I, Darel Hong, the attending physician, personally viewed and interpreted this ECG.  Date: 08/06/2017 EKG Time:  Rate: 77 Rhythm: normal sinus rhythm QRS Axis: normal Intervals: Slightly prolonged QTC ST/T Wave abnormalities: normal Narrative Interpretation: no evidence of acute ischemia  ____________________________________________  RADIOLOGY  Chest x-ray reviewed by me with no acute disease CT chest reviewed by me with no chest pathology but does show possible adrenal  hemorrhage ____________________________________________   PROCEDURES  Procedure(s) performed: no  Procedures  Critical Care performed: no  Observation: no ____________________________________________   INITIAL IMPRESSION / ASSESSMENT AND PLAN / ED COURSE  Pertinent labs & imaging results that were available during my care of the patient were reviewed by me and considered in my medical decision making (see chart for details).  The patient arrives in exquisite discomfort.  4 mg of morphine given without improvement in symptoms and another 4 mg given with only minimal improvement.  EKG  is nonischemic.  Bedside ultrasound shows trace effusion with decent squeeze.  Blood work is pending.  The patient CT scan is concerning for cholelithiasis as well as right sided adrenal hemorrhage.  He also has coronary calcifications.  At this point I have given the patient 1 mg of hydromorphone.  Given his increasing pain requirements and unclear etiology of his pain including high risk for coronary artery disease the patient requires inpatient admission for further risk stratification.  I discussed with the patient and his family who verbalized understanding and agreement the plan.  I then discussed with the hospitalist who is graciously agreed to admit the patient to his service.      ____________________________________________   FINAL CLINICAL IMPRESSION(S) / ED DIAGNOSES  Final diagnoses:  Pain  Chest pain, unspecified type  Gallstones      NEW MEDICATIONS STARTED DURING THIS VISIT:  New Prescriptions   No medications on file     Note:  This document was prepared using Dragon voice recognition software and may include unintentional dictation errors.     Darel Hong, MD 08/06/17 1204

## 2017-08-06 NOTE — ED Triage Notes (Signed)
Chest pain began this R chest, began 7am, intermittent in intensity.  Now sharp R chest pain.

## 2017-08-06 NOTE — Progress Notes (Signed)
Patient CT of the abdomen shows adrenal hemorrhage, I discussed with the on-call surgeon who states that they do not operate on this here.  I discussed the case with Dr. Lucky Cowboy who was states that he will see the patient normally these are just monitored but if needed he can embolize the blood vessel supplying the adrenal gland.

## 2017-08-06 NOTE — Progress Notes (Signed)
Pre- PD vitals

## 2017-08-06 NOTE — Progress Notes (Signed)
Ch responded to order for [name] Chaplain delivered and educated Pt and wife. Ch questioned Pt to qualify Pt. They will review and fill out part A and will call Ch when ready for notary.    08/06/17 1300  Clinical Encounter Type  Visited With Patient and family together  Visit Type Initial  Referral From Nurse  Spiritual Encounters  Spiritual Needs Brochure;Prayer

## 2017-08-06 NOTE — H&P (Signed)
[facility] NAME: Joseph [name]    MR#:  [id number]  DATE OF BIRTH:  1965-12-20  DATE OF ADMISSION:  08/06/2017  PRIMARY CARE PHYSICIAN: Alycia Rossetti, MD   REQUESTING/REFERRING PHYSICIAN: Darel Hong  MD  CHIEF COMPLAINT:   Chief Complaint  Patient presents with  . Chest Pain    HISTORY OF PRESENT ILLNESS: Joseph [name]  is a 52 y.o. male with a known history of end-stage renal disease on peritoneal dialysis, amyloidosis, essential hypertension, GERD and Central serous chorioretinopathy who is presenting to the hospital with complaint of severe right-sided chest pain.  Patient states that he woke up this morning and started having nausea and vomiting.  And subsequently started having right lower abdominal pain which subsided but then started having right-sided chest pain.  Patient in the emergency room had a CT scan of the chest which shows possible adrenal hemorrhage.  He continues to have some right-sided chest discomfort however is improved after pain medication  PAST MEDICAL HISTORY:   Past Medical History:  Diagnosis Date  . Blood clot in abdominal vein    happened around age 56 due to traumatic injury  . Blood dyscrasia    amyloidosis  . Central serous chorioretinopathy   . Dry eye   . GERD (gastroesophageal reflux disease)   . Heart murmur    never followed up with this, no problems  . Hyperlipidemia   . Hypertension    improved since starting dialysis  . Pneumonia    as child  . Renal insufficiency     PAST SURGICAL HISTORY:  Past Surgical History:  Procedure Laterality Date  . AORTOGRAM Bilateral 04/11/2015   Procedure: Aortogram;  Surgeon: Katha Cabal, MD;  Location: [facility];  Service: Cardiovascular;  Laterality: Bilateral;  . CAPD INSERTION N/A 07/09/2015   Procedure: LAPAROSCOPIC INSERTION CONTINUOUS AMBULATORY PERITONEAL DIALYSIS  (CAPD) CATHETER;  Surgeon: Katha Cabal, MD;   Location: [facility];  Service: Vascular;  Laterality: N/A;  . COLONOSCOPY WITH PROPOFOL N/A 01/11/2016   Procedure: COLONOSCOPY WITH PROPOFOL;  Surgeon: [name], MD;  Location: [facility];  Service: Endoscopy;  Laterality: N/A;  . EXPLORATORY LAPAROTOMY     done to find and remove abdominal blood clot as a teenager  . IMPLANTATION / PLACEMENT PERMANENT EPIDURAL CATHETER Right 2016  . PERIPHERAL VASCULAR CATHETERIZATION Left 04/11/2015   Procedure: Embolization;  Surgeon: Katha Cabal, MD;  Location: [facility];  Service: Cardiovascular;  Laterality: Left;  . PERIPHERAL VASCULAR CATHETERIZATION N/A 04/16/2015   Procedure: Dialysis/Perma Catheter Insertion;  Surgeon: Katha Cabal, MD;  Location: [facility];  Service: Cardiovascular;  Laterality: N/A;  . PERIPHERAL VASCULAR CATHETERIZATION N/A 08/24/2015   Procedure: Dialysis/Perma Catheter Removal;  Surgeon: Katha Cabal, MD;  Location: [facility];  Service: Cardiovascular;  Laterality: N/A;  . RENAL BIOPSY, PERCUTANEOUS  04/06/2015      . TONSILLECTOMY      SOCIAL HISTORY:  Social History   Tobacco Use  . Smoking status: Former Smoker    Packs/day: 0.50    Years: 20.00    Pack years: 10.00    Types: Cigarettes    Last attempt to quit: 11/04/2006    Years since quitting: 10.7  . Smokeless tobacco: Never Used  Substance Use Topics  . Alcohol use: No    FAMILY HISTORY:  Family History  Problem Relation Age of Onset  . Hypertension Mother   .  Birth defects Sister   . Multiple sclerosis Sister   . Heart disease Maternal Uncle   . Early death Maternal Uncle   . Heart attack Maternal Uncle   . Hypertension Other   . Heart attack Maternal Uncle   . Heart disease Maternal Uncle     DRUG ALLERGIES:  Allergies  Allergen Reactions  . Ciprofloxacin Hcl Swelling    High fever  . Doxycycline Hyclate Swelling  . Phenol-Glycerin Swelling    REVIEW OF SYSTEMS:   CONSTITUTIONAL:  No fever, fatigue or weakness.  EYES: No blurred or double vision.  EARS, NOSE, AND THROAT: No tinnitus or ear pain.  RESPIRATORY: No cough, shortness of breath, wheezing or hemoptysis.  CARDIOVASCULAR: Positive for right-sided chest pain, orthopnea, edema.  GASTROINTESTINAL: No nausea, vomiting, diarrhea or abdominal pain.  GENITOURINARY: No dysuria, hematuria.  ENDOCRINE: No polyuria, nocturia,  HEMATOLOGY: No anemia, easy bruising or bleeding SKIN: No rash or lesion. MUSCULOSKELETAL: No joint pain or arthritis.   NEUROLOGIC: No tingling, numbness, weakness.  PSYCHIATRY: No anxiety or depression.   MEDICATIONS AT HOME:  Prior to Admission medications   Medication Sig Start Date End Date Taking? Authorizing Provider  albuterol (PROVENTIL HFA;VENTOLIN HFA) 108 (90 Base) MCG/ACT inhaler Inhale 2 puffs into the lungs every 6 (six) hours as needed for wheezing or shortness of breath. Patient not taking: Reported on 06/06/2017 05/24/17   Flora Lipps, MD  Artificial Tear Solution (SOOTHE XP OP) Apply 1 drop to eye as needed.    [provider]  atorvastatin (LIPITOR) 20 MG tablet TAKE 1 TABLET BY MOUTH ONCE DAILY 09/11/16   Alycia Rossetti, MD  cycloSPORINE (RESTASIS) 0.05 % ophthalmic emulsion Place 1 drop into both eyes daily.     [provider]  Epoetin Alfa (EPOGEN IJ) Inject as directed every 30 (thirty) days. Administered at Dialysis center.    [provider]  folic acid (FOLVITE) 1 MG tablet Take 5 mg by mouth daily. 03/14/16   [provider]  gentamicin cream (GARAMYCIN) 0.1 % Apply 1 application topically at bedtime.  12/31/15   [provider]  Hypromellose (ARTIFICIAL TEARS OP) Apply 1 drop to eye as needed.    [provider]  ketorolac (ACULAR) 0.4 % SOLN Place 1 drop into the right eye 4 times daily.    [provider]  ketorolac (ACULAR) 0.5 % ophthalmic solution  04/18/17   [provider]  losartan  (COZAAR) 100 MG tablet Take 1 tablet (100 mg total) by mouth daily. Patient taking differently: Take 50 mg by mouth daily.  11/16/15   [place], Modena Nunnery, MD  methotrexate (RHEUMATREX) 2.5 MG tablet Take 10 mg by mouth once a week. 03/14/16   [provider]  ondansetron (ZOFRAN-ODT) 4 MG disintegrating tablet Take 1 tablet (4 mg total) by mouth every 8 (eight) hours as needed for nausea or vomiting. 04/21/15   Vaughan Basta, MD  pantoprazole (PROTONIX) 40 MG tablet Take 1 tablet (40 mg total) by mouth 2 (two) times daily before a meal. Patient taking differently: Take 40 mg by mouth daily.  04/21/15   Vaughan Basta, MD  prednisoLONE acetate (PRED FORTE) 1 % ophthalmic suspension Place 1 drop into the right eye 2 times daily.    [provider]  VELTASSA 8.4 g packet Take 1 packet by mouth daily. 12/20/15   [provider]      PHYSICAL EXAMINATION:   VITAL SIGNS: Blood pressure (!) 182/108, pulse 71, temperature 98.4 F (  36.9 C), temperature source Oral, resp. rate 18, height 5\' 11"  (1.803 m), weight 190 lb (86.2 kg), SpO2 100 %.  GENERAL:  52 y.o.-year-old patient lying in the bed with no acute distress.  EYES: Pupils equal, round, reactive to light and accommodation. No scleral icterus. Extraocular muscles intact.  HEENT: Head atraumatic, normocephalic. Oropharynx and nasopharynx clear.  NECK:  Supple, no jugular venous distention. No thyroid enlargement, no tenderness.  LUNGS: Normal breath sounds bilaterally, no wheezing, rales,rhonchi or crepitation. No use of accessory muscles of respiration.  CARDIOVASCULAR: S1, S2 normal. No murmurs, rubs, or gallops.  ABDOMEN: Soft, nontender, nondistended. Bowel sounds present. No organomegaly or mass.  PD dialysis in place EXTREMITIES: No pedal edema, cyanosis, or clubbing.  NEUROLOGIC: Cranial nerves II through XII are intact. Muscle strength 5/5 in all extremities. Sensation intact. Gait not checked.   PSYCHIATRIC: The patient is alert and oriented x 3.  SKIN: No obvious rash, lesion, or ulcer.   LABORATORY PANEL:   CBC Recent Labs  Lab 08/06/17 0925  WBC 10.9*  HGB 10.3*  HCT 31.8*  PLT 324  MCV 99.4  MCH 32.1  MCHC 32.3  RDW 21.5*  LYMPHSABS 2.3  MONOABS 0.9  EOSABS 0.9*  BASOSABS 0.2*   ------------------------------------------------------------------------------------------------------------------  Chemistries  Recent Labs  Lab 08/06/17 0925  NA 142  K 3.2*  CL 110  CO2 21*  GLUCOSE 172*  BUN 39*  CREATININE 3.93*  CALCIUM 8.4*  AST 26  ALT 30  ALKPHOS 147*  BILITOT 0.6   ------------------------------------------------------------------------------------------------------------------ estimated creatinine clearance is 22.9 mL/min (A) (by C-G formula based on SCr of 3.93 mg/dL (H)). ------------------------------------------------------------------------------------------------------------------ No results for input(s): TSH, T4TOTAL, T3FREE, THYROIDAB in the last 72 hours.  Invalid input(s): FREET3   Coagulation profile No results for input(s): INR, PROTIME in the last 168 hours. ------------------------------------------------------------------------------------------------------------------- No results for input(s): DDIMER in the last 72 hours. -------------------------------------------------------------------------------------------------------------------  Cardiac Enzymes Recent Labs  Lab 08/06/17 0925  TROPONINI <0.03   ------------------------------------------------------------------------------------------------------------------ Invalid input(s): POCBNP  ---------------------------------------------------------------------------------------------------------------  Urinalysis    Component Value Date/Time   COLORURINE STRAW (A) 04/05/2015 0838   APPEARANCEUR CLEAR (A) 04/05/2015 0838   LABSPEC 1.011 04/05/2015 0838   PHURINE 5.0  04/05/2015 0838   GLUCOSEU NEGATIVE 04/05/2015 0838   HGBUR NEGATIVE 04/05/2015 0838   BILIRUBINUR NEGATIVE 04/05/2015 0838   KETONESUR NEGATIVE 04/05/2015 0838   PROTEINUR 100 (A) 04/05/2015 0838   NITRITE NEGATIVE 04/05/2015 0838   LEUKOCYTESUR NEGATIVE 04/05/2015 0838     RADIOLOGY: Ct Chest Wo Contrast  Result Date: 08/06/2017 CLINICAL DATA:  Right-sided chest pain.  Amyloidosis. EXAM: CT CHEST WITHOUT CONTRAST TECHNIQUE: Multidetector CT imaging of the chest was performed following the standard protocol without IV contrast. COMPARISON:  05/02/2017. FINDINGS: Cardiovascular: Mild coronary artery calcification. Heart is at the upper limits of normal in size. No pericardial effusion. Mediastinum/Nodes: Mediastinal lymph nodes measure up to 1.2 cm in the low right paratracheal station, as before. Subcarinal lymph nodes measure up to 1.5 cm, also stable. Hilar regions are difficult to evaluate without IV contrast. No axillary adenopathy. Esophagus is grossly unremarkable. Lungs/Pleura: Mild bibasilar scarring and atelectasis. Lungs are otherwise clear. Multiple extrapleural lesions are seen in the posterior hemi thoraces bilaterally, measuring up to 1.5 x 2.7 cm on the right, likely neurogenic in origin. No pleural fluid. Airway is unremarkable. Upper Abdomen: Visualized portion of the liver is unremarkable. Trace perihepatic fluid. Stones are seen in the gallbladder. Large area of high attenuation in the right suprarenal fossa  measures at least 5.2 x 6.2 cm, incompletely imaged, and likely arises from the right adrenal gland, new from 05/02/2017. Fat necrosis adjacent to the posterior margin of the upper pole left kidney, incompletely imaged. Spleen appears enlarged but is incompletely imaged. Small perisplenic fluid. Visualized portions of the pancreas, stomach and bowel are grossly unremarkable. Musculoskeletal: Degenerative changes in the spine. No worrisome lytic or sclerotic lesions. IMPRESSION:  1. Large area of incompletely imaged high attenuation in the right suprarenal fossa most likely represents right adrenal hemorrhage and may relate to the patient's history of amyloidosis. 2. No acute findings in the chest. 3. Small ascites. 4. Coronary artery calcification. 5. Mild mediastinal adenopathy, unchanged. 6. Cholelithiasis. 7. Spleen appears enlarged but is incompletely imaged. Electronically Signed   By: Lorin Picket M.D.   On: 08/06/2017 10:11   Dg Chest Port 1 View  Result Date: 08/06/2017 CLINICAL DATA:  Right-sided chest pain for several hours EXAM: PORTABLE CHEST 1 VIEW COMPARISON:  01/29/2017 FINDINGS: The heart size and mediastinal contours are within normal limits. Both lungs are clear. The visualized skeletal structures are unremarkable. IMPRESSION: No active disease. Electronically Signed   By: Inez Catalina M.D.   On: 08/06/2017 09:44    EKG: Orders placed or performed during the hospital encounter of 08/06/17  . ED EKG  . ED EKG  . EKG 12-Lead  . EKG 12-Lead    IMPRESSION AND PLAN: Patient's 52 year old white male with history of end-stage renal disease on peritoneal dialysis and amyloidosis presenting with right-sided chest pain brief episode of abdominal pain  1.  Right-sided chest pain etiology unclear Will check cardiac enzymes Cardiology evaluation  2.  Possible adrenal hemorrhage I have ordered a CT scan of the abdomen to further evaluate Depending on results may need surgical evaluation  3.  Accelerated hypertension: Continue therapy with losartan and hydralazine IV as needed  4.  Hyperlipidemia unspecified continue therapy with Lipitor  5.  Central serous corneal retinopathy: Continue eyedrops as taking at home  6.  Miscellaneous SCDs for DVT prophylaxis in light of possible concern for hemorrhage   All the records are reviewed and case discussed with ED provider. Management plans discussed with the patient, family and they are in  agreement.  CODE STATUS: Code Status History    Date Active Date Inactive Code Status Order ID Comments User Context   01/29/2017 14:19 01/31/2017 14:39 Full Code [id number]  Theodoro Grist, MD Inpatient   05/31/2015 09:59 06/01/2015 03:31 Full Code [id number]  Sabino Dick, MD [place]   04/06/2015 12:39 04/21/2015 13:45 Full Code [id number]  Anthonette Legato, MD Inpatient       TOTAL TIME TAKING CARE OF THIS PATIENT:55 minutes.    Dustin Flock M.D on 08/06/2017 at 10:51 AM  Between 7am to 6pm - Pager - [phone]  After 6pm go to [email or web address] - password [email or web address] [facility]  [facility]  Office  [phone]  CC: Primary care physician; Alycia Rossetti, MD

## 2017-08-06 NOTE — Progress Notes (Signed)
Talked to Dr. Holley Raring about patient's peritoneal dialysis, per Dr. Holley Raring he will see patient and MD will place order for dialysis. No other concern at the moment. RN will continue to monitor.

## 2017-08-06 NOTE — ED Notes (Signed)
Report to [facility], to room 254 by bed.

## 2017-08-06 NOTE — Consult Note (Signed)
Cardiology Consultation:   Patient ID: Joseph [name]; [id number]; 15-Mar-1966   Admit date: 08/06/2017 Date of Consult: 08/06/2017  Primary Care Provider: Alycia Rossetti, MD Primary Cardiologist: Dr. Yvone Neu and now Dr. Rockey Situ Primary Electrophysiologist:  n/a   Patient Profile:   Joseph [name] is a 52 y.o. male with a hx of ESRD who is being seen today for the evaluation of right sided chest pain at the request of Dr. Posey Pronto.Marland Kitchen  History of Present Illness:   Joseph [name] is a 52 y.o. 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 Patient had worsening proteinuria in October 2016, he underwent kidney biopsy which revealed amyloidosis. His biopsy was complicated by post residual bleeding for which he required 2 weeks of ICU stay, 9 blood transfusion and coil embolization of his left renal artery. Given amyloidosis + solitary functioning kidney and ischemic ATN from hemorrhage, he progressed to needing dialysis. Patient started on hemodialysis in November 2016 and continue through March 2017 , then he transitioned to peritoneal dialysis. Regarding his amyloidosis, patient follows up with oncologist at [facility], Dr. Amalia Hailey. Patient is felt to have lysosomal amyloidosis which is a rare condition. He had fat pad biopsy and bone marrow biopsy done, never required chemotherapy, he got cardiac MRI in the past which has ruled out restrictive cardiomyopathy. 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  He is currently on the kidney transplant list at [facility] and also getting evaluation at [place] to be on their transplant list as well. Part of his transplant evaluation, he has undergone cardiac evaluation in our office. -- Echo --- done  in 05/2017 and showed Left ventricle cavity size was normal. There was mild concentric hypertrophy. Systolic function was normal. Theestimated ejection fraction was in the range of 55% to 60%. Wall motion was normal; there were no regional wall motionabnormalities. Left ventricular diastolic function parameters were normal. Aortic root dimension: 39 mm (ED). Aortic root: The aortic root was mildly dilated.PASP could not be accurately estimated.   Treadmill nuclear stress test--in 10/2016 , was negative.  The patient denies any recent exertional chest pain or shortness of breath. He started having right-sided chest and abdominal pain around 7:00 this morning which was very intense.  This has continued throughout the day. The patient had CT scanning done which showed evidence of bleeding from the right adrenal gland.  He continues to have some discomfort. We are asked to evaluate his chest pain.  Troponin remains negative and EKG has no ischemic changes.    Past Medical History:  Diagnosis Date  . Amyloidosis ([place])   . Blood clot in abdominal vein    happened around age 31 due to traumatic injury  . Blood dyscrasia    amyloidosis  . Central serous chorioretinopathy   . Dry eye   . GERD (gastroesophageal reflux disease)   . Heart murmur    never followed up with this, no problems  . Hyperlipidemia   . Hypertension    improved since starting dialysis  . Pneumonia    as child  . Renal insufficiency     Past Surgical History:  Procedure Laterality Date  . AORTOGRAM Bilateral 04/11/2015   Procedure: Aortogram;  Surgeon: Katha Cabal, MD;  Location: [place] [facility];  Service: Cardiovascular;  Laterality: Bilateral;  . CAPD INSERTION N/A 07/09/2015   Procedure: LAPAROSCOPIC INSERTION CONTINUOUS AMBULATORY PERITONEAL DIALYSIS  (CAPD) CATHETER;  Surgeon: Katha Cabal, MD;  Location: [facility];  Service: Vascular;  Laterality: N/A;  . COLONOSCOPY WITH PROPOFOL N/A  01/11/2016   Procedure: COLONOSCOPY WITH PROPOFOL;  Surgeon: [name], MD;  Location: [facility];  Service: Endoscopy;  Laterality: N/A;  . EXPLORATORY LAPAROTOMY     done to find and remove abdominal blood clot as a teenager  . IMPLANTATION / PLACEMENT PERMANENT EPIDURAL CATHETER Right 2016  . PERIPHERAL VASCULAR CATHETERIZATION Left 04/11/2015   Procedure: Embolization;  Surgeon: Katha Cabal, MD;  Location: [place] [facility];  Service: Cardiovascular;  Laterality: Left;  . PERIPHERAL VASCULAR CATHETERIZATION N/A 04/16/2015   Procedure: Dialysis/Perma Catheter Insertion;  Surgeon: Katha Cabal, MD;  Location: [place] [facility];  Service: Cardiovascular;  Laterality: N/A;  . PERIPHERAL VASCULAR CATHETERIZATION N/A 08/24/2015   Procedure: Dialysis/Perma Catheter Removal;  Surgeon: Katha Cabal, MD;  Location: [place] [facility];  Service: Cardiovascular;  Laterality: N/A;  . RENAL BIOPSY, PERCUTANEOUS  04/06/2015      . TONSILLECTOMY       Home Medications:  Prior to Admission medications   Medication Sig Start Date End Date Taking? Authorizing Provider  Aflibercept (EYLEA) 2 MG/0.05ML SOLN 1 Dose by Intravitreal route every 30 (thirty) days.   Yes [provider]  albuterol (PROVENTIL HFA;VENTOLIN HFA) 108 (90 Base) MCG/ACT inhaler Inhale 2 puffs into the lungs every 6 (six) hours as needed for wheezing or shortness of breath. 05/24/17  Yes Kasa, Maretta Bees, MD  atorvastatin (LIPITOR) 20 MG tablet TAKE 1 TABLET BY MOUTH ONCE DAILY 09/11/16  Yes Beluga, Modena Nunnery, MD  CEPHALEXIN PO Take 1 capsule by mouth daily as needed (prior to appointment).   Yes [provider]  cycloSPORINE (RESTASIS) 0.05 % ophthalmic emulsion Place 1 drop into both eyes daily.    Yes [provider]  Epoetin Alfa (EPOGEN IJ) Inject as directed every 30 (thirty) days. Administered at Dialysis center.   Yes [provider]  folic acid (FOLVITE) 1 MG tablet Take  5 mg by mouth daily. 03/14/16  Yes [provider]  gentamicin cream (GARAMYCIN) 0.1 % Apply 1 application topically at bedtime.  12/31/15  Yes [provider]  Hypromellose (ARTIFICIAL TEARS OP) Apply 1 drop to eye as needed.   Yes [provider]  losartan (COZAAR) 100 MG tablet Take 1 tablet (100 mg total) by mouth daily. 11/16/15  Yes Posen, Modena Nunnery, MD  methotrexate (RHEUMATREX) 2.5 MG tablet Take 10 mg by mouth once a week. 03/14/16  Yes [provider]  ondansetron (ZOFRAN-ODT) 4 MG disintegrating tablet Take 1 tablet (4 mg total) by mouth every 8 (eight) hours as needed for nausea or vomiting. 04/21/15  Yes Vaughan Basta, MD  pantoprazole (PROTONIX) 40 MG tablet Take 1 tablet (40 mg total) by mouth 2 (two) times daily before a meal. Patient taking differently: Take 40 mg by mouth daily.  04/21/15  Yes Vaughan Basta, MD  VELTASSA 8.4 g packet Take 1 packet by mouth daily. 12/20/15  Yes [provider]    Inpatient Medications: Scheduled Meds: . [START ON 08/23/2017] Aflibercept  1 Dose Intravitreal Q30 days  . atorvastatin  20 mg Oral Daily  . folic acid  5 mg Oral Daily  . gentamicin cream  1 application Topical QHS  . losartan  100 mg Oral Daily  .  methotrexate  10 mg Oral Weekly  . pantoprazole  40 mg Oral BID AC  . patiromer  1 packet Oral Daily   Continuous Infusions: . sodium chloride 100 mL/hr at 08/06/17 1215   PRN Meds: acetaminophen **OR** acetaminophen, albuterol, hydrALAZINE, HYDROmorphone (DILAUDID) injection, ondansetron **OR** ondansetron (ZOFRAN) IV, ondansetron, oxyCODONE, polyvinyl alcohol, promethazine  Allergies:    Allergies  Allergen Reactions  . Ciprofloxacin Hcl Swelling    High fever  . Doxycycline Hyclate Swelling  . Phenol-Glycerin Swelling    Social History:   Social History   Socioeconomic History  . Marital status: Married    Spouse name: Not on file  . Number of children: Not on  file  . Years of education: Not on file  . Highest education level: Not on file  Social Needs  . Financial resource strain: Not on file  . Food insecurity - worry: Not on file  . Food insecurity - inability: Not on file  . Transportation needs - medical: Not on file  . Transportation needs - non-medical: Not on file  Occupational History  . Not on file  Tobacco Use  . Smoking status: Former Smoker    Packs/day: 0.50    Years: 20.00    Pack years: 10.00    Types: Cigarettes    Last attempt to quit: 11/04/2006    Years since quitting: 10.7  . Smokeless tobacco: Never Used  Substance and Sexual Activity  . Alcohol use: No  . Drug use: No  . Sexual activity: Yes  Other Topics Concern  . Not on file  Social History Narrative  . Not on file    Family History:    Family History  Problem Relation Age of Onset  . Hypertension Mother   . Birth defects Sister   . Multiple sclerosis Sister   . Heart disease Maternal Uncle   . Early death Maternal Uncle   . Heart attack Maternal Uncle   . Hypertension Other   . Heart attack Maternal Uncle   . Heart disease Maternal Uncle      ROS:  Please see the history of present illness.   All other ROS reviewed and negative.     Physical Exam/Data:   Vitals:   08/06/17 1018 08/06/17 1250 08/06/17 1444 08/06/17 1629  BP: (!) 182/108 (!) 160/100 (!) 158/95 (!) 149/80  Pulse: 71 74 72 72  Resp: 18 18 18    Temp:   (!) 97.5 F (36.4 C)   TempSrc:   Oral   SpO2: 100% 100% 99% 98%  Weight:      Height:       No intake or output data in the 24 hours ending 08/06/17 1649 Filed Weights   08/06/17 0926  Weight: 190 lb (86.2 kg)   Body mass index is 26.5 kg/m.  General:  Well nourished, well developed, in no acute distress HEENT: normal Lymph: no adenopathy Neck: no JVD Endocrine:  No thryomegaly Vascular: No carotid bruits; FA pulses 2+ bilaterally without bruits  Cardiac:  normal S1, S2; RRR; no murmur  Lungs:  clear to  auscultation bilaterally, no wheezing, rhonchi or rales  Abd: soft, mild tenderness in the right upper quadrant area. Ext: no edema Musculoskeletal:  No deformities, BUE and BLE strength normal and equal Skin: warm and dry  Neuro:  CNs 2-12 intact, no focal abnormalities noted Psych:  Normal affect   EKG:  The EKG was personally reviewed and demonstrates: Normal sinus rhythm with no significant ST or  T wave changes.   Laboratory Data:  Chemistry Recent Labs  Lab 08/06/17 0925  NA 142  K 3.2*  CL 110  CO2 21*  GLUCOSE 172*  BUN 39*  CREATININE 3.93*  CALCIUM 8.4*  GFRNONAA 16*  GFRAA 18*  ANIONGAP 11    Recent Labs  Lab 08/06/17 0925  PROT 5.9*  ALBUMIN 2.9*  AST 26  ALT 30  ALKPHOS 147*  BILITOT 0.6   Hematology Recent Labs  Lab 08/06/17 0925  WBC 10.9*  RBC 3.20*  HGB 10.3*  HCT 31.8*  MCV 99.4  MCH 32.1  MCHC 32.3  RDW 21.5*  PLT 324   Cardiac Enzymes Recent Labs  Lab 08/06/17 0925 08/06/17 1549  TROPONINI <0.03 <0.03   No results for input(s): TROPIPOC in the last 168 hours.  BNP Recent Labs  Lab 08/06/17 0925  BNP 66.0    DDimer No results for input(s): DDIMER in the last 168 hours.  Radiology/Studies:  Ct Abdomen Pelvis Wo Contrast  Result Date: 08/06/2017 CLINICAL DATA:  Abdominal pain for several hours EXAM: CT ABDOMEN AND PELVIS WITHOUT CONTRAST TECHNIQUE: Multidetector CT imaging of the abdomen and pelvis was performed following the standard protocol without IV contrast. COMPARISON:  Ultrasound from earlier in the same day, CT from 05/14/2015 FINDINGS: Lower chest: Mild atelectatic changes are noted in the bases bilaterally. Hepatobiliary: Gallbladder is well distended with multiple dependent gallstones. No pericholecystic fluid is noted. The liver is within normal limits. Pancreas: Unremarkable. No pancreatic ductal dilatation or surrounding inflammatory changes. Spleen: Within normal limits. Adrenals/Urinary Tract: The left adrenal  gland remains enlarged similar to that seen on prior CT from 2016 left kidney again demonstrates changes consistent with prior embolization. Small exophytic cyst is identified. Additionally scarring is noted surrounding the left kidney related to the prior hematoma. The right kidney shows some cystic change. No obstructive changes are noted. Bladder is within normal limits. Superior to the right kidney and in the area of the right adrenal gland, there is an area of increased attenuation consistent with acute hemorrhage. This interdigitates inferiorly along the psoas muscle on the right as well as superiorly along the posterior margin of the liver. Stomach/Bowel: Appendix is not well visualized. No obstructive or inflammatory changes are noted. Scattered diverticular change of the sigmoid and descending colons is seen. Vascular/Lymphatic: Aortic calcifications are noted without aneurysmal dilatation. Retroperitoneal lymph nodes are identified but relatively stable from previous exam. Reproductive: Prostate is unremarkable. Other: Peritoneal dialysis catheter is noted in the right lower quadrant. Small amount of free fluid is noted consistent with the peritoneal dialysate. Musculoskeletal: No acute or significant osseous findings. IMPRESSION: Changes in the region of the right adrenal gland consistent with acute hemorrhage. This courses both inferiorly and superiorly from the region of the right adrenal and is likely the etiology of the patient's underlying discomfort. This may be related to the known history of amyloidosis. Correlate with any recent blood thinners. Changes consistent with prior embolization left kidney related to renal hematoma and hemorrhage. Mild free fluid consistent with the history of peritoneal dialysis. Cholelithiasis without complicating factors. Electronically Signed   By: Inez Catalina M.D.   On: 08/06/2017 14:05   Ct Chest Wo Contrast  Result Date: 08/06/2017 CLINICAL DATA:  Right-sided  chest pain.  Amyloidosis. EXAM: CT CHEST WITHOUT CONTRAST TECHNIQUE: Multidetector CT imaging of the chest was performed following the standard protocol without IV contrast. COMPARISON:  05/02/2017. FINDINGS: Cardiovascular: Mild coronary artery calcification. Heart is at the upper  limits of normal in size. No pericardial effusion. Mediastinum/Nodes: Mediastinal lymph nodes measure up to 1.2 cm in the low right paratracheal station, as before. Subcarinal lymph nodes measure up to 1.5 cm, also stable. Hilar regions are difficult to evaluate without IV contrast. No axillary adenopathy. Esophagus is grossly unremarkable. Lungs/Pleura: Mild bibasilar scarring and atelectasis. Lungs are otherwise clear. Multiple extrapleural lesions are seen in the posterior hemi thoraces bilaterally, measuring up to 1.5 x 2.7 cm on the right, likely neurogenic in origin. No pleural fluid. Airway is unremarkable. Upper Abdomen: Visualized portion of the liver is unremarkable. Trace perihepatic fluid. Stones are seen in the gallbladder. Large area of high attenuation in the right suprarenal fossa measures at least 5.2 x 6.2 cm, incompletely imaged, and likely arises from the right adrenal gland, new from 05/02/2017. Fat necrosis adjacent to the posterior margin of the upper pole left kidney, incompletely imaged. Spleen appears enlarged but is incompletely imaged. Small perisplenic fluid. Visualized portions of the pancreas, stomach and bowel are grossly unremarkable. Musculoskeletal: Degenerative changes in the spine. No worrisome lytic or sclerotic lesions. IMPRESSION: 1. Large area of incompletely imaged high attenuation in the right suprarenal fossa most likely represents right adrenal hemorrhage and may relate to the patient's history of amyloidosis. 2. No acute findings in the chest. 3. Small ascites. 4. Coronary artery calcification. 5. Mild mediastinal adenopathy, unchanged. 6. Cholelithiasis. 7. Spleen appears enlarged but is  incompletely imaged. Electronically Signed   By: Lorin Picket M.D.   On: 08/06/2017 10:11   Dg Chest Port 1 View  Result Date: 08/06/2017 CLINICAL DATA:  Right-sided chest pain for several hours EXAM: PORTABLE CHEST 1 VIEW COMPARISON:  01/29/2017 FINDINGS: The heart size and mediastinal contours are within normal limits. Both lungs are clear. The visualized skeletal structures are unremarkable. IMPRESSION: No active disease. Electronically Signed   By: Inez Catalina M.D.   On: 08/06/2017 09:44   US Abdomen Limited Ruq  Result Date: 08/06/2017 CLINICAL DATA:  Right upper quadrant pain x4 hours EXAM: ULTRASOUND ABDOMEN LIMITED RIGHT UPPER QUADRANT COMPARISON:  None. FINDINGS: Gallbladder: Multiple layering gallstones. 5 mm adherent gallstone versus gallbladder polyp. No gallbladder wall thickening or pericholecystic fluid. Common bile duct: Diameter: 4 mm Liver: Hyperechoic hepatic parenchyma, suggesting hepatic steatosis. No focal hepatic lesion is seen. Portal vein is patent on color Doppler imaging with normal direction of blood flow towards the liver. Additional comments: Trace perihepatic ascites. IMPRESSION: Mild cholelithiasis, without associated sonographic findings to suggest acute cholecystitis. Hyperechoic hepatic parenchyma, suggesting hepatic steatosis. Trace perihepatic ascites. Electronically Signed   By: Julian Hy M.D.   On: 08/06/2017 11:46    Assessment and Plan:   1. Right-sided abdominal and chest pain: Likely due to hemorrhage from right adrenal gland as shown on CT scan.  The patient's symptoms are not suggestive of a cardiac etiology.  He had cardiac workup done last year for his transplant workup which was negative.  No further cardiac workup is recommended at this time.  2.  Right adrenal gland hemorrhage: I agree with vascular surgery consult in case embolization is needed.  Continue to monitor closely.  3.  End-stage renal disease on peritoneal dialysis: Followed  by nephrology.  Currently on the transplant list.   For questions or updates, please contact [facility] Please consult [email or web address] for contact info under Cardiology/STEMI.   Signed, Kathlyn Sacramento, MD  08/06/2017 4:49 PM

## 2017-08-06 NOTE — Consult Note (Signed)
[facility] Vascular Consult Note  MRN : [id number]  Joseph [name] is a 52 y.o. (1965/09/09) male who presents with chief complaint of  Chief Complaint  Patient presents with  . Chest Pain  .  History of Present Illness: I am asked to see the patient by Dr. Posey Pronto for an adrenal hemorrhage.  The patient is a dialysis patient who we previously treated with a renal embolization on the left several years ago for a bleed after a renal biopsy.  This stopped the bleeding but ultimately he did transition to dialysis which he remains on currently and is doing well.  About 24 hours ago, he began having right upper abdominal and chest pain.  This was spontaneous without trauma, injury, or inciting event.  This is still present although slightly better than it started.  He underwent a CT scan of the abdomen and pelvis which I have independently reviewed.  Although this is an uninfused scan, this shows a moderate hematoma around the adrenal gland that would be consistent with a spontaneous adrenal hemorrhage on the right.  Current Facility-Administered Medications  Medication Dose Route Frequency Provider Last Rate Last Dose  . 0.9 %  sodium chloride infusion   Intravenous Continuous Dustin Flock, MD 100 mL/hr at 08/06/17 1215    . acetaminophen (TYLENOL) tablet 650 mg  650 mg Oral Q6H PRN Dustin Flock, MD       Or  . acetaminophen (TYLENOL) suppository 650 mg  650 mg Rectal Q6H PRN Dustin Flock, MD      . Derrill Memo ON 08/23/2017] Aflibercept SOLN 1 Dose  1 Dose Intravitreal Q30 days Dustin Flock, MD      . albuterol (PROVENTIL) (2.5 MG/3ML) 0.083% nebulizer solution 2.5 mg  2.5 mg Nebulization Q6H PRN Dustin Flock, MD      . atorvastatin (LIPITOR) tablet 20 mg  20 mg Oral Daily Dustin Flock, MD   20 mg at 08/06/17 1446  . folic acid (FOLVITE) tablet 5 mg  5 mg Oral Daily Dustin Flock, MD   5 mg at 08/06/17 1447  . gentamicin cream (GARAMYCIN) 0.1 % 1 application   1 application Topical QHS Dustin Flock, MD      . hydrALAZINE (APRESOLINE) injection 10 mg  10 mg Intravenous Q6H PRN Dustin Flock, MD      . HYDROmorphone (DILAUDID) injection 1 mg  1 mg Intravenous Q3H PRN Dustin Flock, MD      . losartan (COZAAR) tablet 100 mg  100 mg Oral Daily Dustin Flock, MD   100 mg at 08/06/17 1447  . methotrexate (RHEUMATREX) tablet 10 mg  10 mg Oral Weekly Dustin Flock, MD      . ondansetron [facility]) tablet 4 mg  4 mg Oral Q6H PRN Dustin Flock, MD       Or  . ondansetron (ZOFRAN) injection 4 mg  4 mg Intravenous Q6H PRN Dustin Flock, MD      . ondansetron (ZOFRAN-ODT) disintegrating tablet 4 mg  4 mg Oral Q8H PRN Dustin Flock, MD      . oxyCODONE (Oxy IR/ROXICODONE) immediate release tablet 5 mg  5 mg Oral Q4H PRN Dustin Flock, MD      . pantoprazole (PROTONIX) EC tablet 40 mg  40 mg Oral BID AC Dustin Flock, MD   40 mg at 08/06/17 1631  . patiromer Daryll Drown) packet 1 packet  1 packet Oral Daily Dustin Flock, MD      . polyvinyl alcohol (LIQUIFILM TEARS) 1.4 % ophthalmic  solution 2 drop  2 drop Both Eyes PRN Dustin Flock, MD      . promethazine (PHENERGAN) injection 25 mg  25 mg Intravenous Q6H PRN Dustin Flock, MD   25 mg at 08/06/17 1454    Past Medical History:  Diagnosis Date  . Amyloidosis ([place])   . Blood clot in abdominal vein    happened around age 59 due to traumatic injury  . Blood dyscrasia    amyloidosis  . Central serous chorioretinopathy   . Dry eye   . GERD (gastroesophageal reflux disease)   . Heart murmur    never followed up with this, no problems  . Hyperlipidemia   . Hypertension    improved since starting dialysis  . Pneumonia    as child  . Renal insufficiency     Past Surgical History:  Procedure Laterality Date  . AORTOGRAM Bilateral 04/11/2015   Procedure: Aortogram;  Surgeon: Katha Cabal, MD;  Location: [facility];  Service: Cardiovascular;  Laterality: Bilateral;  .  CAPD INSERTION N/A 07/09/2015   Procedure: LAPAROSCOPIC INSERTION CONTINUOUS AMBULATORY PERITONEAL DIALYSIS  (CAPD) CATHETER;  Surgeon: Katha Cabal, MD;  Location: [facility];  Service: Vascular;  Laterality: N/A;  . COLONOSCOPY WITH PROPOFOL N/A 01/11/2016   Procedure: COLONOSCOPY WITH PROPOFOL;  Surgeon: [name], MD;  Location: [facility];  Service: Endoscopy;  Laterality: N/A;  . EXPLORATORY LAPAROTOMY     done to find and remove abdominal blood clot as a teenager  . IMPLANTATION / PLACEMENT PERMANENT EPIDURAL CATHETER Right 2016  . PERIPHERAL VASCULAR CATHETERIZATION Left 04/11/2015   Procedure: Embolization;  Surgeon: Katha Cabal, MD;  Location: [facility];  Service: Cardiovascular;  Laterality: Left;  . PERIPHERAL VASCULAR CATHETERIZATION N/A 04/16/2015   Procedure: Dialysis/Perma Catheter Insertion;  Surgeon: Katha Cabal, MD;  Location: [facility] [place] [facility];  Service: Cardiovascular;  Laterality: N/A;  . PERIPHERAL VASCULAR CATHETERIZATION N/A 08/24/2015   Procedure: Dialysis/Perma Catheter Removal;  Surgeon: Katha Cabal, MD;  Location: [facility];  Service: Cardiovascular;  Laterality: N/A;  . RENAL BIOPSY, PERCUTANEOUS  04/06/2015      . TONSILLECTOMY      Social History Social History   Tobacco Use  . Smoking status: Former Smoker    Packs/day: 0.50    Years: 20.00    Pack years: 10.00    Types: Cigarettes    Last attempt to quit: 11/04/2006    Years since quitting: 10.7  . Smokeless tobacco: Never Used  Substance Use Topics  . Alcohol use: No  . Drug use: No    Family History Family History  Problem Relation Age of Onset  . Hypertension Mother   . Birth defects Sister   . Multiple sclerosis Sister   . Heart disease Maternal Uncle   . Early death Maternal Uncle   . Heart attack Maternal Uncle   . Hypertension Other   . Heart attack Maternal Uncle   . Heart disease Maternal Uncle     Allergies  Allergen Reactions   . Ciprofloxacin Hcl Swelling    High fever  . Doxycycline Hyclate Swelling  . Phenol-Glycerin Swelling     REVIEW OF SYSTEMS (Negative unless checked)  Constitutional: [] Weight loss  [] Fever  [] Chills Cardiac: [x] Chest pain   [] Chest pressure   [x] Palpitations   [] Shortness of breath when laying flat   [] Shortness of breath at rest   [x] Shortness of breath with exertion. Vascular:  [] Pain in legs with  walking   [] Pain in legs at rest   [] Pain in legs when laying flat   [] Claudication   [] Pain in feet when walking  [] Pain in feet at rest  [] Pain in feet when laying flat   [] History of DVT   [] Phlebitis   [] Swelling in legs   [] Varicose veins   [] Non-healing ulcers Pulmonary:   [] Uses home oxygen   [] Productive cough   [] Hemoptysis   [] Wheeze  [] COPD   [] Asthma Neurologic:  [] Dizziness  [] Blackouts   [] Seizures   [] History of stroke   [] History of TIA  [] Aphasia   [] Temporary blindness   [] Dysphagia   [] Weakness or numbness in arms   [] Weakness or numbness in legs Musculoskeletal:  [x] Arthritis   [] Joint swelling   [x] Joint pain   [] Low back pain Hematologic:  [] Easy bruising  [] Easy bleeding   [] Hypercoagulable state   [] Anemic  [] Hepatitis Gastrointestinal:  [] Blood in stool   [] Vomiting blood  [x] Gastroesophageal reflux/heartburn   [] Difficulty swallowing. Genitourinary:  [x] Chronic kidney disease   [] Difficult urination  [] Frequent urination  [] Burning with urination   [] Blood in urine Skin:  [] Rashes   [] Ulcers   [] Wounds Psychological:  [] History of anxiety   []  History of major depression.  Physical Examination  Vitals:   08/06/17 1018 08/06/17 1250 08/06/17 1444 08/06/17 1629  BP: (!) 182/108 (!) 160/100 (!) 158/95 (!) 149/80  Pulse: 71 74 72 72  Resp: 18 18 18    Temp:   (!) 97.5 F (36.4 C)   TempSrc:   Oral   SpO2: 100% 100% 99% 98%  Weight:      Height:       Body mass index is 26.5 kg/m. Gen:  WD/WN, NAD Head: [place]/AT, No temporalis wasting.  Ear/Nose/Throat: Hearing  grossly intact, nares w/o erythema or drainage, oropharynx w/o Erythema/Exudate Eyes: Sclera non-icteric, conjunctiva clear.  Neck: Trachea midline.  No JVD.  Pulmonary:  Good air movement, respirations not labored, equal bilaterally.  Cardiac: RRR, no JVD Vascular:  Vessel Right Left  Radial Palpable Palpable                                   Gastrointestinal: soft, RUQ tenderness  Musculoskeletal: M/S 5/5 throughout.  Extremities without ischemic changes.   Neurologic: Sensation grossly intact in extremities.  Symmetrical.  Speech is fluent. Motor exam as listed above. Psychiatric: Judgment intact, Mood & affect appropriate for pt's clinical situation. Dermatologic: No rashes or ulcers noted.  No cellulitis or open wounds.       CBC Lab Results  Component Value Date   WBC 10.9 (H) 08/06/2017   HGB 10.3 (L) 08/06/2017   HCT 31.8 (L) 08/06/2017   MCV 99.4 08/06/2017   PLT 324 08/06/2017    BMET    Component Value Date/Time   NA 142 08/06/2017 0925   NA 141 04/06/2014 0754   K 3.2 (L) 08/06/2017 0925   K 5.2 (H) 04/06/2014 0754   CL 110 08/06/2017 0925   CL 111 (H) 04/06/2014 0754   CO2 21 (L) 08/06/2017 0925   CO2 24 04/06/2014 0754   GLUCOSE 172 (H) 08/06/2017 0925   GLUCOSE 104 (H) 04/06/2014 0754   BUN 39 (H) 08/06/2017 0925   BUN 39 (H) 04/06/2014 0754   CREATININE 3.93 (H) 08/06/2017 0925   CREATININE 4.70 (H) 12/22/2015 0832   CALCIUM 8.4 (L) 08/06/2017 0925   CALCIUM 8.4 (L) 04/06/2014 0754  GFRNONAA 16 (L) 08/06/2017 0925   GFRNONAA 13 (L) 12/22/2015 0832   GFRAA 18 (L) 08/06/2017 0925   GFRAA 15 (L) 12/22/2015 9211   Estimated Creatinine Clearance: 22.9 mL/min (A) (by C-G formula based on SCr of 3.93 mg/dL (H)).  COAG Lab Results  Component Value Date   INR 1.09 07/05/2015   INR 1.05 06/16/2015   INR 1.06 05/31/2015    Radiology Ct Abdomen Pelvis Wo Contrast  Result Date: 08/06/2017 CLINICAL DATA:  Abdominal pain for several hours  EXAM: CT ABDOMEN AND PELVIS WITHOUT CONTRAST TECHNIQUE: Multidetector CT imaging of the abdomen and pelvis was performed following the standard protocol without IV contrast. COMPARISON:  Ultrasound from earlier in the same day, CT from 05/14/2015 FINDINGS: Lower chest: Mild atelectatic changes are noted in the bases bilaterally. Hepatobiliary: Gallbladder is well distended with multiple dependent gallstones. No pericholecystic fluid is noted. The liver is within normal limits. Pancreas: Unremarkable. No pancreatic ductal dilatation or surrounding inflammatory changes. Spleen: Within normal limits. Adrenals/Urinary Tract: The left adrenal gland remains enlarged similar to that seen on prior CT from 2016 left kidney again demonstrates changes consistent with prior embolization. Small exophytic cyst is identified. Additionally scarring is noted surrounding the left kidney related to the prior hematoma. The right kidney shows some cystic change. No obstructive changes are noted. Bladder is within normal limits. Superior to the right kidney and in the area of the right adrenal gland, there is an area of increased attenuation consistent with acute hemorrhage. This interdigitates inferiorly along the psoas muscle on the right as well as superiorly along the posterior margin of the liver. Stomach/Bowel: Appendix is not well visualized. No obstructive or inflammatory changes are noted. Scattered diverticular change of the sigmoid and descending colons is seen. Vascular/Lymphatic: Aortic calcifications are noted without aneurysmal dilatation. Retroperitoneal lymph nodes are identified but relatively stable from previous exam. Reproductive: Prostate is unremarkable. Other: Peritoneal dialysis catheter is noted in the right lower quadrant. Small amount of free fluid is noted consistent with the peritoneal dialysate. Musculoskeletal: No acute or significant osseous findings. IMPRESSION: Changes in the region of the right adrenal  gland consistent with acute hemorrhage. This courses both inferiorly and superiorly from the region of the right adrenal and is likely the etiology of the patient's underlying discomfort. This may be related to the known history of amyloidosis. Correlate with any recent blood thinners. Changes consistent with prior embolization left kidney related to renal hematoma and hemorrhage. Mild free fluid consistent with the history of peritoneal dialysis. Cholelithiasis without complicating factors. Electronically Signed   By: Inez Catalina M.D.   On: 08/06/2017 14:05   Ct Chest Wo Contrast  Result Date: 08/06/2017 CLINICAL DATA:  Right-sided chest pain.  Amyloidosis. EXAM: CT CHEST WITHOUT CONTRAST TECHNIQUE: Multidetector CT imaging of the chest was performed following the standard protocol without IV contrast. COMPARISON:  05/02/2017. FINDINGS: Cardiovascular: Mild coronary artery calcification. Heart is at the upper limits of normal in size. No pericardial effusion. Mediastinum/Nodes: Mediastinal lymph nodes measure up to 1.2 cm in the low right paratracheal station, as before. Subcarinal lymph nodes measure up to 1.5 cm, also stable. Hilar regions are difficult to evaluate without IV contrast. No axillary adenopathy. Esophagus is grossly unremarkable. Lungs/Pleura: Mild bibasilar scarring and atelectasis. Lungs are otherwise clear. Multiple extrapleural lesions are seen in the posterior hemi thoraces bilaterally, measuring up to 1.5 x 2.7 cm on the right, likely neurogenic in origin. No pleural fluid. Airway is unremarkable. Upper Abdomen: Visualized portion  of the liver is unremarkable. Trace perihepatic fluid. Stones are seen in the gallbladder. Large area of high attenuation in the right suprarenal fossa measures at least 5.2 x 6.2 cm, incompletely imaged, and likely arises from the right adrenal gland, new from 05/02/2017. Fat necrosis adjacent to the posterior margin of the upper pole left kidney, incompletely  imaged. Spleen appears enlarged but is incompletely imaged. Small perisplenic fluid. Visualized portions of the pancreas, stomach and bowel are grossly unremarkable. Musculoskeletal: Degenerative changes in the spine. No worrisome lytic or sclerotic lesions. IMPRESSION: 1. Large area of incompletely imaged high attenuation in the right suprarenal fossa most likely represents right adrenal hemorrhage and may relate to the patient's history of amyloidosis. 2. No acute findings in the chest. 3. Small ascites. 4. Coronary artery calcification. 5. Mild mediastinal adenopathy, unchanged. 6. Cholelithiasis. 7. Spleen appears enlarged but is incompletely imaged. Electronically Signed   By: Lorin Picket M.D.   On: 08/06/2017 10:11   US Renal  Result Date: 08/02/2017 CLINICAL DATA:  Renal cysts. EXAM: RENAL / URINARY TRACT ULTRASOUND COMPLETE COMPARISON:  Body CT 05/14/2015 FINDINGS: Right Kidney: Length: 13.5 cm. Echogenicity within normal limits. No mass or hydronephrosis visualized. There is a 4.4 x 3.2 x 3.4 cm benign-appearing cyst in the upper pole of the right kidney. Left Kidney: Length: 12.5 cm. Echogenicity within normal limits. No mass or hydronephrosis visualized. There is a benign-appearing parapelvic cyst in the midpole region of the left kidney which measures 2.0 x 1.8 x 1.9 cm. Bladder: Appears normal for degree of bladder distention. IMPRESSION: Normal renal echogenicity. Bilateral benign-appearing renal cysts. Electronically Signed   By: Fidela Salisbury M.D.   On: 08/02/2017 21:15   Dg Chest Port 1 View  Result Date: 08/06/2017 CLINICAL DATA:  Right-sided chest pain for several hours EXAM: PORTABLE CHEST 1 VIEW COMPARISON:  01/29/2017 FINDINGS: The heart size and mediastinal contours are within normal limits. Both lungs are clear. The visualized skeletal structures are unremarkable. IMPRESSION: No active disease. Electronically Signed   By: Inez Catalina M.D.   On: 08/06/2017 09:44   US  Abdomen Limited Ruq  Result Date: 08/06/2017 CLINICAL DATA:  Right upper quadrant pain x4 hours EXAM: ULTRASOUND ABDOMEN LIMITED RIGHT UPPER QUADRANT COMPARISON:  None. FINDINGS: Gallbladder: Multiple layering gallstones. 5 mm adherent gallstone versus gallbladder polyp. No gallbladder wall thickening or pericholecystic fluid. Common bile duct: Diameter: 4 mm Liver: Hyperechoic hepatic parenchyma, suggesting hepatic steatosis. No focal hepatic lesion is seen. Portal vein is patent on color Doppler imaging with normal direction of blood flow towards the liver. Additional comments: Trace perihepatic ascites. IMPRESSION: Mild cholelithiasis, without associated sonographic findings to suggest acute cholecystitis. Hyperechoic hepatic parenchyma, suggesting hepatic steatosis. Trace perihepatic ascites. Electronically Signed   By: Julian Hy M.D.   On: 08/06/2017 11:46      Assessment/Plan 1.  Right adrenal hemorrhage.  Spontaneous.  These will often stop on their own observation in the short-term would be reasonable.  If he has continued bleeding embolization would be a good alternative for treatment.  Risks and benefits of embolization were discussed with the patient and should he have a drop in his hemoglobin or signs of continued bleeding, we would proceed with embolization of the right adrenal.  Given the fact that he is on dialysis, it would also be reasonable to him right kidney if that is necessary to successfully embolize the right adrenal gland. Serial CBC and monitor HR and BP 2.  End-stage renal disease.  Would  try to not use heparin with dialysis the next several treatments.  As above, if we have to embolize the adrenal gland on the right, right renal embolization may necessary to completely exclude flow.  Patient voices his understanding. 3.  Hypertension.  Stable on outpatient medications and blood pressure control important in reducing the progression of atherosclerotic disease. On  appropriate oral medications.    Leotis Pain, MD  08/06/2017 4:56 PM    This note was created with Dragon medical transcription system.  Any error is purely unintentional

## 2017-08-07 DIAGNOSIS — E2749 Other adrenocortical insufficiency: Secondary | ICD-10-CM | POA: Diagnosis not present

## 2017-08-07 DIAGNOSIS — R079 Chest pain, unspecified: Secondary | ICD-10-CM | POA: Diagnosis not present

## 2017-08-07 DIAGNOSIS — N186 End stage renal disease: Secondary | ICD-10-CM | POA: Diagnosis not present

## 2017-08-07 DIAGNOSIS — I10 Essential (primary) hypertension: Secondary | ICD-10-CM | POA: Diagnosis not present

## 2017-08-07 LAB — HEMOGLOBIN AND HEMATOCRIT, BLOOD
HCT: 30.6 % — ABNORMAL LOW (ref 40.0–52.0)
HEMATOCRIT: 29.9 % — AB (ref 40.0–52.0)
HEMOGLOBIN: 9.6 g/dL — AB (ref 13.0–18.0)
Hemoglobin: 9.7 g/dL — ABNORMAL LOW (ref 13.0–18.0)

## 2017-08-07 LAB — CBC
HEMATOCRIT: 31.1 % — AB (ref 40.0–52.0)
Hemoglobin: 9.9 g/dL — ABNORMAL LOW (ref 13.0–18.0)
MCH: 32.1 pg (ref 26.0–34.0)
MCHC: 31.9 g/dL — ABNORMAL LOW (ref 32.0–36.0)
MCV: 100.5 fL — ABNORMAL HIGH (ref 80.0–100.0)
PLATELETS: 338 10*3/uL (ref 150–440)
RBC: 3.1 MIL/uL — AB (ref 4.40–5.90)
RDW: 22.1 % — AB (ref 11.5–14.5)
WBC: 15.7 10*3/uL — AB (ref 3.8–10.6)

## 2017-08-07 LAB — BASIC METABOLIC PANEL
ANION GAP: 9 (ref 5–15)
BUN: 40 mg/dL — ABNORMAL HIGH (ref 6–20)
CALCIUM: 8.4 mg/dL — AB (ref 8.9–10.3)
CO2: 23 mmol/L (ref 22–32)
Chloride: 110 mmol/L (ref 101–111)
Creatinine, Ser: 4.04 mg/dL — ABNORMAL HIGH (ref 0.61–1.24)
GFR, EST AFRICAN AMERICAN: 18 mL/min — AB (ref >60)
GFR, EST NON AFRICAN AMERICAN: 15 mL/min — AB (ref >60)
Glucose, Bld: 201 mg/dL — ABNORMAL HIGH (ref 65–99)
POTASSIUM: 3.6 mmol/L (ref 3.5–5.1)
Sodium: 142 mmol/L (ref 135–145)

## 2017-08-07 LAB — TROPONIN I

## 2017-08-07 MED ORDER — POLYETHYLENE GLYCOL 3350 17 G PO PACK
17.0000 g | PACK | Freq: Every day | ORAL | Status: DC
  Administered 2017-08-07 – 2017-08-09 (×3): 17 g via ORAL
  Filled 2017-08-07 (×3): qty 1

## 2017-08-07 MED ORDER — ONDANSETRON HCL 4 MG/2ML IJ SOLN
4.0000 mg | Freq: Four times a day (QID) | INTRAMUSCULAR | Status: AC
  Administered 2017-08-07 – 2017-08-09 (×8): 4 mg via INTRAVENOUS
  Filled 2017-08-07 (×8): qty 2

## 2017-08-07 MED ORDER — OXYCODONE HCL 5 MG PO TABS
5.0000 mg | ORAL_TABLET | ORAL | Status: DC | PRN
  Administered 2017-08-07 (×2): 10 mg via ORAL
  Filled 2017-08-07 (×2): qty 2

## 2017-08-07 MED ORDER — DOCUSATE SODIUM 100 MG PO CAPS
200.0000 mg | ORAL_CAPSULE | Freq: Two times a day (BID) | ORAL | Status: DC
  Administered 2017-08-07 – 2017-08-09 (×4): 200 mg via ORAL
  Filled 2017-08-07 (×4): qty 2

## 2017-08-07 NOTE — Progress Notes (Signed)
[facility]  Daily Progress Note   Subjective  - * No surgery found *  Significant nausea overnight right chest, shoulder, and upper abdominal pain about the same.  Hemoglobin basically stable.  No tachycardia or hypotension.  Objective Vitals:   08/06/17 2100 08/07/17 0358 08/07/17 0450 08/07/17 0840  BP: (!) 162/89 (!) 152/93 (!) 157/92 (!) 164/92  Pulse: 89 89 89 88  Resp:    18  Temp: 98.3 F (36.8 C) 98.1 F (36.7 C) 98.1 F (36.7 C) 98.2 F (36.8 C)  TempSrc: Oral Oral Oral Oral  SpO2: 98% 95% 96% 98%  Weight:   89.5 kg (197 lb 4.8 oz)   Height:        Intake/Output Summary (Last 24 hours) at 08/07/2017 0938 Last data filed at 08/07/2017 0848 Gross per 24 hour  Intake 1606.67 ml  Output 1198 ml  Net 408.67 ml    PULM  CTAB CV  RRR   Laboratory CBC    Component Value Date/Time   WBC 15.7 (H) 08/07/2017 0627   HGB 9.9 (L) 08/07/2017 0627   HGB 9.9 (L) 04/06/2014 0754   HCT 31.1 (L) 08/07/2017 0627   HCT 30.7 (L) 04/06/2014 0754   PLT 338 08/07/2017 0627   PLT 273 04/06/2014 0754    BMET    Component Value Date/Time   NA 142 08/07/2017 0627   NA 141 04/06/2014 0754   K 3.6 08/07/2017 0627   K 5.2 (H) 04/06/2014 0754   CL 110 08/07/2017 0627   CL 111 (H) 04/06/2014 0754   CO2 23 08/07/2017 0627   CO2 24 04/06/2014 0754   GLUCOSE 201 (H) 08/07/2017 0627   GLUCOSE 104 (H) 04/06/2014 0754   BUN 40 (H) 08/07/2017 0627   BUN 39 (H) 04/06/2014 0754   CREATININE 4.04 (H) 08/07/2017 0627   CREATININE 4.70 (H) 12/22/2015 0832   CALCIUM 8.4 (L) 08/07/2017 0627   CALCIUM 8.4 (L) 04/06/2014 0754   GFRNONAA 15 (L) 08/07/2017 0627   GFRNONAA 13 (L) 12/22/2015 0832   GFRAA 18 (L) 08/07/2017 0627   GFRAA 15 (L) 12/22/2015 4709    Assessment/Planning:    Spontaneous right adrenal hemorrhage.  Hemoglobin stable.  Have discussed the case with nephrology and many of these will stop on their own, but given his previous history of  requiring embolization and amyloidosis would have a low threshold if he drops his hemoglobin or shows signs of continued bleeding.  I have discussed with nephrology that repeat imaging tomorrow may be helpful in determining whether or not continued bleeding is present as well.  End-stage renal disease.  Peritoneal dialysis working well.    Leotis Pain  08/07/2017, 9:38 AM

## 2017-08-07 NOTE — Progress Notes (Signed)
CCPD completed without issue. No alarms overnight. 744mL removed. Effluent clear/yellow. Dr. Holley Raring at bedside. Patient denied dressing change due to wanting to use dressings from home (we use 2x2s here) and he prefers occlusive. Will perform dressing change prior to hook up this evening. Report given to primary RN.

## 2017-08-07 NOTE — Care Management (Signed)
Observation for hemorrhage right adrenal. hgb is stable.  Consideration for need of embolization.  Patient is chronic PD and Joseph [name] with [facility]  is aware of observation status

## 2017-08-07 NOTE — Progress Notes (Signed)
[facility]  ROUNDING NOTE   Subjective:  Patient well known to [place].  Presented with chest pain that radiated to his back.At the same time he was also having abdominal pain. Patient had CT scan of the abdomen and pelvis without contrast which showed right adrenal gland hemorrhage. He has known history of amyloidosis.    Objective:  Vital signs in last 24 hours:  Temp:  [98.1 F (36.7 C)-99 F (37.2 C)] 99 F (37.2 C) (02/12 1502) Pulse Rate:  [88-89] 89 (02/12 1502) Resp:  [18] 18 (02/12 1502) BP: (152-164)/(89-93) 157/92 (02/12 1502) SpO2:  [95 %-98 %] 98 % (02/12 1502) Weight:  [89.5 kg (197 lb 4.8 oz)] 89.5 kg (197 lb 4.8 oz) (02/12 0450)  Weight change:  Filed Weights   08/06/17 0926 08/07/17 0450  Weight: 86.2 kg (190 lb) 89.5 kg (197 lb 4.8 oz)    Intake/Output: I/O last 3 completed shifts: In: 1606.7 [I.V.:1606.7] Out: 400 [Urine:400]   Intake/Output this shift:  Total I/O In: -  Out: 798 [Other:798]  Physical Exam: General: No acute distress  Head: Normocephalic, atraumatic. Moist oral mucosal membranes  Eyes: Anicteric  Neck: Supple, trachea midline  Lungs:  Clear to auscultation, normal effort  Heart: S1S2 no rubs  Abdomen:  Soft, mild diffuse tenderness  Extremities: No peripheral edema.  Neurologic: Awake, alert, following commands  Skin: No lesions  Access: PD catheter in place    Basic Metabolic Panel: Recent Labs  Lab 08/06/17 0925 08/07/17 0627  NA 142 142  K 3.2* 3.6  CL 110 110  CO2 21* 23  GLUCOSE 172* 201*  BUN 39* 40*  CREATININE 3.93* 4.04*  CALCIUM 8.4* 8.4*    Liver Function Tests: Recent Labs  Lab 08/06/17 0925  AST 26  ALT 30  ALKPHOS 147*  BILITOT 0.6  PROT 5.9*  ALBUMIN 2.9*   No results for input(s): LIPASE, AMYLASE in the last 168 hours. No results for input(s): AMMONIA in the last 168 hours.  CBC: Recent Labs  Lab 08/06/17 0925 08/07/17 0627 08/07/17 1015  WBC 10.9* 15.7*  --    NEUTROABS 6.7*  --   --   HGB 10.3* 9.9* 9.7*  HCT 31.8* 31.1* 30.6*  MCV 99.4 100.5*  --   PLT 324 338  --     Cardiac Enzymes: Recent Labs  Lab 08/06/17 0925 08/06/17 1549 08/06/17 2310  TROPONINI <0.03 <0.03 <0.03    BNP: Invalid input(s): POCBNP  CBG: No results for input(s): GLUCAP in the last 168 hours.  Microbiology: Results for orders placed or performed during the hospital encounter of 08/06/17  MRSA PCR Screening     Status: None   Collection Time: 08/06/17  3:47 PM  Result Value Ref Range Status   MRSA by PCR NEGATIVE NEGATIVE Final    Comment:        The GeneXpert MRSA Assay (FDA approved for NASAL specimens only), is one component of a comprehensive MRSA colonization surveillance program. It is not intended to diagnose MRSA infection nor to guide or monitor treatment for MRSA infections. Performed at [facility], [address] [place],  [address]     Coagulation Studies: No results for input(s): LABPROT, INR in the last 72 hours.  Urinalysis: No results for input(s): COLORURINE, LABSPEC, PHURINE, GLUCOSEU, HGBUR, BILIRUBINUR, KETONESUR, PROTEINUR, UROBILINOGEN, NITRITE, LEUKOCYTESUR in the last 72 hours.  Invalid input(s): APPERANCEUR    Imaging: Ct Abdomen Pelvis Wo Contrast  Result Date: 08/06/2017 CLINICAL DATA:  Abdominal pain for several hours EXAM: CT ABDOMEN AND PELVIS WITHOUT CONTRAST TECHNIQUE: Multidetector CT imaging of the abdomen and pelvis was performed following the standard protocol without IV contrast. COMPARISON:  Ultrasound from earlier in the same day, CT from 05/14/2015 FINDINGS: Lower chest: Mild atelectatic changes are noted in the bases bilaterally. Hepatobiliary: Gallbladder is well distended with multiple dependent gallstones. No pericholecystic fluid is noted. The liver is within normal limits. Pancreas: Unremarkable. No pancreatic ductal dilatation or surrounding inflammatory changes. Spleen: Within  normal limits. Adrenals/Urinary Tract: The left adrenal gland remains enlarged similar to that seen on prior CT from 2016 left kidney again demonstrates changes consistent with prior embolization. Small exophytic cyst is identified. Additionally scarring is noted surrounding the left kidney related to the prior hematoma. The right kidney shows some cystic change. No obstructive changes are noted. Bladder is within normal limits. Superior to the right kidney and in the area of the right adrenal gland, there is an area of increased attenuation consistent with acute hemorrhage. This interdigitates inferiorly along the psoas muscle on the right as well as superiorly along the posterior margin of the liver. Stomach/Bowel: Appendix is not well visualized. No obstructive or inflammatory changes are noted. Scattered diverticular change of the sigmoid and descending colons is seen. Vascular/Lymphatic: Aortic calcifications are noted without aneurysmal dilatation. Retroperitoneal lymph nodes are identified but relatively stable from previous exam. Reproductive: Prostate is unremarkable. Other: Peritoneal dialysis catheter is noted in the right lower quadrant. Small amount of free fluid is noted consistent with the peritoneal dialysate. Musculoskeletal: No acute or significant osseous findings. IMPRESSION: Changes in the region of the right adrenal gland consistent with acute hemorrhage. This courses both inferiorly and superiorly from the region of the right adrenal and is likely the etiology of the patient's underlying discomfort. This may be related to the known history of amyloidosis. Correlate with any recent blood thinners. Changes consistent with prior embolization left kidney related to renal hematoma and hemorrhage. Mild free fluid consistent with the history of peritoneal dialysis. Cholelithiasis without complicating factors. Electronically Signed   By: Inez Catalina M.D.   On: 08/06/2017 14:05   Ct Chest Wo  Contrast  Result Date: 08/06/2017 CLINICAL DATA:  Right-sided chest pain.  Amyloidosis. EXAM: CT CHEST WITHOUT CONTRAST TECHNIQUE: Multidetector CT imaging of the chest was performed following the standard protocol without IV contrast. COMPARISON:  05/02/2017. FINDINGS: Cardiovascular: Mild coronary artery calcification. Heart is at the upper limits of normal in size. No pericardial effusion. Mediastinum/Nodes: Mediastinal lymph nodes measure up to 1.2 cm in the low right paratracheal station, as before. Subcarinal lymph nodes measure up to 1.5 cm, also stable. Hilar regions are difficult to evaluate without IV contrast. No axillary adenopathy. Esophagus is grossly unremarkable. Lungs/Pleura: Mild bibasilar scarring and atelectasis. Lungs are otherwise clear. Multiple extrapleural lesions are seen in the posterior hemi thoraces bilaterally, measuring up to 1.5 x 2.7 cm on the right, likely neurogenic in origin. No pleural fluid. Airway is unremarkable. Upper Abdomen: Visualized portion of the liver is unremarkable. Trace perihepatic fluid. Stones are seen in the gallbladder. Large area of high attenuation in the right suprarenal fossa measures at least 5.2 x 6.2 cm, incompletely imaged, and likely arises from the right adrenal gland, new from 05/02/2017. Fat necrosis adjacent to the posterior margin of the upper pole left kidney, incompletely imaged. Spleen appears enlarged but is incompletely imaged. Small perisplenic fluid. Visualized portions of the pancreas, stomach and bowel are grossly unremarkable. Musculoskeletal: Degenerative changes  in the spine. No worrisome lytic or sclerotic lesions. IMPRESSION: 1. Large area of incompletely imaged high attenuation in the right suprarenal fossa most likely represents right adrenal hemorrhage and may relate to the patient's history of amyloidosis. 2. No acute findings in the chest. 3. Small ascites. 4. Coronary artery calcification. 5. Mild mediastinal adenopathy,  unchanged. 6. Cholelithiasis. 7. Spleen appears enlarged but is incompletely imaged. Electronically Signed   By: Lorin Picket M.D.   On: 08/06/2017 10:11   Dg Chest Port 1 View  Result Date: 08/06/2017 CLINICAL DATA:  Right-sided chest pain for several hours EXAM: PORTABLE CHEST 1 VIEW COMPARISON:  01/29/2017 FINDINGS: The heart size and mediastinal contours are within normal limits. Both lungs are clear. The visualized skeletal structures are unremarkable. IMPRESSION: No active disease. Electronically Signed   By: Inez Catalina M.D.   On: 08/06/2017 09:44   US Abdomen Limited Ruq  Result Date: 08/06/2017 CLINICAL DATA:  Right upper quadrant pain x4 hours EXAM: ULTRASOUND ABDOMEN LIMITED RIGHT UPPER QUADRANT COMPARISON:  None. FINDINGS: Gallbladder: Multiple layering gallstones. 5 mm adherent gallstone versus gallbladder polyp. No gallbladder wall thickening or pericholecystic fluid. Common bile duct: Diameter: 4 mm Liver: Hyperechoic hepatic parenchyma, suggesting hepatic steatosis. No focal hepatic lesion is seen. Portal vein is patent on color Doppler imaging with normal direction of blood flow towards the liver. Additional comments: Trace perihepatic ascites. IMPRESSION: Mild cholelithiasis, without associated sonographic findings to suggest acute cholecystitis. Hyperechoic hepatic parenchyma, suggesting hepatic steatosis. Trace perihepatic ascites. Electronically Signed   By: Julian Hy M.D.   On: 08/06/2017 11:46     Medications:   . sodium chloride 100 mL/hr at 08/07/17 0907  . dialysis solution 2.5% low-MG/low-CA     . [START ON 08/23/2017] Aflibercept  1 Dose Intravitreal Q30 days  . atorvastatin  20 mg Oral Daily  . folic acid  5 mg Oral Daily  . gentamicin cream  1 application Topical QHS  . gentamicin cream  1 application Topical Daily  . losartan  100 mg Oral Daily  . methotrexate  10 mg Oral Weekly  . ondansetron (ZOFRAN) IV  4 mg Intravenous Q6H  . pantoprazole  40 mg  Oral BID AC  . patiromer  1 packet Oral Daily   acetaminophen **OR** acetaminophen, albuterol, hydrALAZINE, HYDROmorphone (DILAUDID) injection, oxyCODONE, polyvinyl alcohol, promethazine  Assessment/ Plan:  52 y.o. male with past medical history of hypertension, hyperlipidemia, anemia chronic kidney disease, peptic ulcer disease, ESRD on PD, anemia of CKD proteinuria, hyperkalemia, s/p renal biopsy 04/06/15, renal biopsy showed amyloid nephropathy, which is likely hereditary in nature, now admitted with right adrenal gland hemorrhage.  1.  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  -  Patient had PD yesterday, will continue with PD tonight.    2. Anemia of chronic kidney disease. Hgb currently 9.7, slightly lower than admission, continue to monitor given adrenal hemmorhage.  3. Secondary hyperparathyroidism.  Monitor phos periodically.    4. Hyperkalemia: maintain patient on veltassa.  5.  Right adrenal gland hemorrhage:  Suspect secondary to amyloidosis.  Appreciate vascular surgery input, continue to monitor clinically.  We will check H and H q12 hours, consider repeat CT scan tomorrow.      LOS: 0 Joseph [name] 2/12/20194:39 PM

## 2017-08-07 NOTE — Progress Notes (Signed)
[facility] [place] NAME: Joseph [name]    MR#:  [id number]  DATE OF BIRTH:  Oct 31, 1965  SUBJECTIVE:  CHIEF COMPLAINT:   Chief Complaint  Patient presents with  . Chest Pain   -Still has occasional chest tightness, right shoulder pain and diffuse abdominal pain.  -Continue to monitor hemoglobin -Complains of nausea  REVIEW OF SYSTEMS:  Review of Systems  Constitutional: Negative for chills, fever and malaise/fatigue.  HENT: Negative for congestion, hearing loss and nosebleeds.   Eyes: Negative for blurred vision and double vision.  Respiratory: Negative for cough, shortness of breath and wheezing.   Cardiovascular: Positive for chest pain. Negative for palpitations and leg swelling.  Gastrointestinal: Positive for abdominal pain and nausea. Negative for constipation, diarrhea and vomiting.  Genitourinary: Negative for dysuria and urgency.  Musculoskeletal: Negative for myalgias.  Neurological: Negative for dizziness, speech change, focal weakness, seizures and headaches.  Psychiatric/Behavioral: Negative for depression.    DRUG ALLERGIES:   Allergies  Allergen Reactions  . Ciprofloxacin Hcl Swelling    High fever  . Doxycycline Hyclate Swelling  . Phenol-Glycerin Swelling    VITALS:  Blood pressure (!) 164/92, pulse 88, temperature 98.2 F (36.8 C), temperature source Oral, resp. rate 18, height 5\' 11"  (1.803 m), weight 89.5 kg (197 lb 4.8 oz), SpO2 98 %.  PHYSICAL EXAMINATION:  Physical Exam  GENERAL:  52 y.o.-year-old patient lying in the bed with no acute distress.  EYES: Pupils equal, round, reactive to light and accommodation. No scleral icterus. Extraocular muscles intact.  HEENT: Head atraumatic, normocephalic. Oropharynx and nasopharynx clear.  NECK:  Supple, no jugular venous distention. No thyroid enlargement, no tenderness.  LUNGS: Normal breath sounds bilaterally, no wheezing, rales,rhonchi or crepitation. No  use of accessory muscles of respiration.  CARDIOVASCULAR: S1, S2 normal. No murmurs, rubs, or gallops.  ABDOMEN: Soft, minimal tenderness in the lower quadrants., nondistended. Bowel sounds present. No organomegaly or mass. Peritoneal dialysis catheter in place EXTREMITIES: No pedal edema, cyanosis, or clubbing.  NEUROLOGIC: Cranial nerves II through XII are intact. Muscle strength 5/5 in all extremities. Sensation intact. Gait not checked.  PSYCHIATRIC: The patient is alert and oriented x 3.  SKIN: No obvious rash, lesion, or ulcer.    LABORATORY PANEL:   CBC Recent Labs  Lab 08/07/17 0627 08/07/17 1015  WBC 15.7*  --   HGB 9.9* 9.7*  HCT 31.1* 30.6*  PLT 338  --    ------------------------------------------------------------------------------------------------------------------  Chemistries  Recent Labs  Lab 08/06/17 0925 08/07/17 0627  NA 142 142  K 3.2* 3.6  CL 110 110  CO2 21* 23  GLUCOSE 172* 201*  BUN 39* 40*  CREATININE 3.93* 4.04*  CALCIUM 8.4* 8.4*  AST 26  --   ALT 30  --   ALKPHOS 147*  --   BILITOT 0.6  --    ------------------------------------------------------------------------------------------------------------------  Cardiac Enzymes Recent Labs  Lab 08/06/17 2310  TROPONINI <0.03   ------------------------------------------------------------------------------------------------------------------  RADIOLOGY:  Ct Abdomen Pelvis Wo Contrast  Result Date: 08/06/2017 CLINICAL DATA:  Abdominal pain for several hours EXAM: CT ABDOMEN AND PELVIS WITHOUT CONTRAST TECHNIQUE: Multidetector CT imaging of the abdomen and pelvis was performed following the standard protocol without IV contrast. COMPARISON:  Ultrasound from earlier in the same day, CT from 05/14/2015 FINDINGS: Lower chest: Mild atelectatic changes are noted in the bases bilaterally. Hepatobiliary: Gallbladder is well distended with multiple dependent gallstones. No pericholecystic fluid is  noted. The liver is within  normal limits. Pancreas: Unremarkable. No pancreatic ductal dilatation or surrounding inflammatory changes. Spleen: Within normal limits. Adrenals/Urinary Tract: The left adrenal gland remains enlarged similar to that seen on prior CT from 2016 left kidney again demonstrates changes consistent with prior embolization. Small exophytic cyst is identified. Additionally scarring is noted surrounding the left kidney related to the prior hematoma. The right kidney shows some cystic change. No obstructive changes are noted. Bladder is within normal limits. Superior to the right kidney and in the area of the right adrenal gland, there is an area of increased attenuation consistent with acute hemorrhage. This interdigitates inferiorly along the psoas muscle on the right as well as superiorly along the posterior margin of the liver. Stomach/Bowel: Appendix is not well visualized. No obstructive or inflammatory changes are noted. Scattered diverticular change of the sigmoid and descending colons is seen. Vascular/Lymphatic: Aortic calcifications are noted without aneurysmal dilatation. Retroperitoneal lymph nodes are identified but relatively stable from previous exam. Reproductive: Prostate is unremarkable. Other: Peritoneal dialysis catheter is noted in the right lower quadrant. Small amount of free fluid is noted consistent with the peritoneal dialysate. Musculoskeletal: No acute or significant osseous findings. IMPRESSION: Changes in the region of the right adrenal gland consistent with acute hemorrhage. This courses both inferiorly and superiorly from the region of the right adrenal and is likely the etiology of the patient's underlying discomfort. This may be related to the known history of amyloidosis. Correlate with any recent blood thinners. Changes consistent with prior embolization left kidney related to renal hematoma and hemorrhage. Mild free fluid consistent with the history of  peritoneal dialysis. Cholelithiasis without complicating factors. Electronically Signed   By: Inez Catalina M.D.   On: 08/06/2017 14:05   Ct Chest Wo Contrast  Result Date: 08/06/2017 CLINICAL DATA:  Right-sided chest pain.  Amyloidosis. EXAM: CT CHEST WITHOUT CONTRAST TECHNIQUE: Multidetector CT imaging of the chest was performed following the standard protocol without IV contrast. COMPARISON:  05/02/2017. FINDINGS: Cardiovascular: Mild coronary artery calcification. Heart is at the upper limits of normal in size. No pericardial effusion. Mediastinum/Nodes: Mediastinal lymph nodes measure up to 1.2 cm in the low right paratracheal station, as before. Subcarinal lymph nodes measure up to 1.5 cm, also stable. Hilar regions are difficult to evaluate without IV contrast. No axillary adenopathy. Esophagus is grossly unremarkable. Lungs/Pleura: Mild bibasilar scarring and atelectasis. Lungs are otherwise clear. Multiple extrapleural lesions are seen in the posterior hemi thoraces bilaterally, measuring up to 1.5 x 2.7 cm on the right, likely neurogenic in origin. No pleural fluid. Airway is unremarkable. Upper Abdomen: Visualized portion of the liver is unremarkable. Trace perihepatic fluid. Stones are seen in the gallbladder. Large area of high attenuation in the right suprarenal fossa measures at least 5.2 x 6.2 cm, incompletely imaged, and likely arises from the right adrenal gland, new from 05/02/2017. Fat necrosis adjacent to the posterior margin of the upper pole left kidney, incompletely imaged. Spleen appears enlarged but is incompletely imaged. Small perisplenic fluid. Visualized portions of the pancreas, stomach and bowel are grossly unremarkable. Musculoskeletal: Degenerative changes in the spine. No worrisome lytic or sclerotic lesions. IMPRESSION: 1. Large area of incompletely imaged high attenuation in the right suprarenal fossa most likely represents right adrenal hemorrhage and may relate to the  patient's history of amyloidosis. 2. No acute findings in the chest. 3. Small ascites. 4. Coronary artery calcification. 5. Mild mediastinal adenopathy, unchanged. 6. Cholelithiasis. 7. Spleen appears enlarged but is incompletely imaged. Electronically Signed   By:  Lorin Picket M.D.   On: 08/06/2017 10:11   Dg Chest Port 1 View  Result Date: 08/06/2017 CLINICAL DATA:  Right-sided chest pain for several hours EXAM: PORTABLE CHEST 1 VIEW COMPARISON:  01/29/2017 FINDINGS: The heart size and mediastinal contours are within normal limits. Both lungs are clear. The visualized skeletal structures are unremarkable. IMPRESSION: No active disease. Electronically Signed   By: Inez Catalina M.D.   On: 08/06/2017 09:44   US Abdomen Limited Ruq  Result Date: 08/06/2017 CLINICAL DATA:  Right upper quadrant pain x4 hours EXAM: ULTRASOUND ABDOMEN LIMITED RIGHT UPPER QUADRANT COMPARISON:  None. FINDINGS: Gallbladder: Multiple layering gallstones. 5 mm adherent gallstone versus gallbladder polyp. No gallbladder wall thickening or pericholecystic fluid. Common bile duct: Diameter: 4 mm Liver: Hyperechoic hepatic parenchyma, suggesting hepatic steatosis. No focal hepatic lesion is seen. Portal vein is patent on color Doppler imaging with normal direction of blood flow towards the liver. Additional comments: Trace perihepatic ascites. IMPRESSION: Mild cholelithiasis, without associated sonographic findings to suggest acute cholecystitis. Hyperechoic hepatic parenchyma, suggesting hepatic steatosis. Trace perihepatic ascites. Electronically Signed   By: Julian Hy M.D.   On: 08/06/2017 11:46    EKG:   Orders placed or performed during the hospital encounter of 08/06/17  . ED EKG  . ED EKG  . EKG 12-Lead  . EKG 12-Lead    ASSESSMENT AND PLAN:   52 year old male with past medical history significant for end-stage renal disease secondary to amyloidosis on peritoneal dialysis, central retinopathy presents to  hospital secondary to chest pain.  1. Chest pain-likely referred pain from his right adrenal gland hemorrhage. -Appreciate cardiology consult. Patient had a complete cardiac workup done prior to being on transplant list which was negative. -No further cardiac workup recommended. Pain control.  2. Right adrenal gland hemorrhage-spontaneous hemorrhage -Appreciate vascular surgery consult. Patient had prior renal hemorrhage requiring embolization after renal biopsy. -Currently monitor hemoglobin, repeat CT tomorrow a.m. If worsening, will need embolization.  3. End-stage renal disease-secondary to amyloidosis, on peritoneal dialysis. -Nephrology consult for this same. -On renal [facility] list at [facility]  4. Amyloidosis- follows at [facility],  -on methotrexate  5. GERD- protonix  6. HTN- losartan  7. DVT prophylaxis- no heparin products due to bleeding - teds and scds   All the records are reviewed and case discussed with Care Management/Social Workerr. Management plans discussed with the patient, family and they are in agreement.  CODE STATUS: Full Code  TOTAL TIME TAKING CARE OF THIS PATIENT: 38 minutes.   POSSIBLE D/C IN 2-3 DAYS, DEPENDING ON CLINICAL CONDITION.   Gladstone Lighter M.D on 08/07/2017 at 1:23 PM  Between 7am to 6pm - Pager - [phone]  After 6pm go to [email or web address] - password [email or web address] [facility]  Office  [phone]  CC: Primary care physician; Alycia Rossetti, MD

## 2017-08-08 ENCOUNTER — Observation Stay: Payer: 59

## 2017-08-08 DIAGNOSIS — Z7682 Awaiting organ transplant status: Secondary | ICD-10-CM | POA: Diagnosis not present

## 2017-08-08 DIAGNOSIS — D631 Anemia in chronic kidney disease: Secondary | ICD-10-CM | POA: Diagnosis present

## 2017-08-08 DIAGNOSIS — K8020 Calculus of gallbladder without cholecystitis without obstruction: Secondary | ICD-10-CM | POA: Diagnosis present

## 2017-08-08 DIAGNOSIS — E875 Hyperkalemia: Secondary | ICD-10-CM | POA: Diagnosis present

## 2017-08-08 DIAGNOSIS — I10 Essential (primary) hypertension: Secondary | ICD-10-CM | POA: Diagnosis not present

## 2017-08-08 DIAGNOSIS — Z992 Dependence on renal dialysis: Secondary | ICD-10-CM | POA: Diagnosis not present

## 2017-08-08 DIAGNOSIS — M25511 Pain in right shoulder: Secondary | ICD-10-CM | POA: Diagnosis present

## 2017-08-08 DIAGNOSIS — E785 Hyperlipidemia, unspecified: Secondary | ICD-10-CM | POA: Diagnosis present

## 2017-08-08 DIAGNOSIS — Z87891 Personal history of nicotine dependence: Secondary | ICD-10-CM | POA: Diagnosis not present

## 2017-08-08 DIAGNOSIS — N2581 Secondary hyperparathyroidism of renal origin: Secondary | ICD-10-CM | POA: Diagnosis present

## 2017-08-08 DIAGNOSIS — R079 Chest pain, unspecified: Secondary | ICD-10-CM | POA: Diagnosis present

## 2017-08-08 DIAGNOSIS — E859 Amyloidosis, unspecified: Secondary | ICD-10-CM | POA: Diagnosis present

## 2017-08-08 DIAGNOSIS — N186 End stage renal disease: Secondary | ICD-10-CM | POA: Diagnosis not present

## 2017-08-08 DIAGNOSIS — K219 Gastro-esophageal reflux disease without esophagitis: Secondary | ICD-10-CM | POA: Diagnosis present

## 2017-08-08 DIAGNOSIS — Z888 Allergy status to other drugs, medicaments and biological substances status: Secondary | ICD-10-CM | POA: Diagnosis not present

## 2017-08-08 DIAGNOSIS — Z79899 Other long term (current) drug therapy: Secondary | ICD-10-CM | POA: Diagnosis not present

## 2017-08-08 DIAGNOSIS — E2749 Other adrenocortical insufficiency: Secondary | ICD-10-CM | POA: Diagnosis not present

## 2017-08-08 DIAGNOSIS — K5900 Constipation, unspecified: Secondary | ICD-10-CM | POA: Diagnosis not present

## 2017-08-08 DIAGNOSIS — H3500 Unspecified background retinopathy: Secondary | ICD-10-CM | POA: Diagnosis present

## 2017-08-08 LAB — CBC
HCT: 29.4 % — ABNORMAL LOW (ref 40.0–52.0)
HEMOGLOBIN: 9.4 g/dL — AB (ref 13.0–18.0)
MCH: 31.9 pg (ref 26.0–34.0)
MCHC: 32 g/dL (ref 32.0–36.0)
MCV: 99.8 fL (ref 80.0–100.0)
Platelets: 283 10*3/uL (ref 150–440)
RBC: 2.95 MIL/uL — AB (ref 4.40–5.90)
RDW: 21.3 % — ABNORMAL HIGH (ref 11.5–14.5)
WBC: 12.9 10*3/uL — ABNORMAL HIGH (ref 3.8–10.6)

## 2017-08-08 LAB — BASIC METABOLIC PANEL
Anion gap: 7 (ref 5–15)
BUN: 40 mg/dL — AB (ref 6–20)
CO2: 23 mmol/L (ref 22–32)
Calcium: 8 mg/dL — ABNORMAL LOW (ref 8.9–10.3)
Chloride: 110 mmol/L (ref 101–111)
Creatinine, Ser: 4.27 mg/dL — ABNORMAL HIGH (ref 0.61–1.24)
GFR calc Af Amer: 17 mL/min — ABNORMAL LOW (ref >60)
GFR calc non Af Amer: 14 mL/min — ABNORMAL LOW (ref >60)
GLUCOSE: 105 mg/dL — AB (ref 65–99)
POTASSIUM: 3.5 mmol/L (ref 3.5–5.1)
SODIUM: 140 mmol/L (ref 135–145)

## 2017-08-08 LAB — HEMOGLOBIN AND HEMATOCRIT, BLOOD
HCT: 26.8 % — ABNORMAL LOW (ref 40.0–52.0)
HEMOGLOBIN: 8.5 g/dL — AB (ref 13.0–18.0)

## 2017-08-08 NOTE — Progress Notes (Signed)
Post HD assessment, Pt tolerated Tx well, Gave report to [employer].

## 2017-08-08 NOTE — Progress Notes (Signed)
[facility] NAME: Joseph [name]    MR#:  [id number]  DATE OF BIRTH:  12-05-1965  SUBJECTIVE:  CHIEF COMPLAINT:   Chief Complaint  Patient presents with  . Chest Pain   -Nausea is much better today.  Chest pain is resolved.  Still has right shoulder pain and right upper quadrant abdominal pain. -Hemoglobin is stable.  Going for repeat CT abdomen today - concerned about color change of his peritoneal dialysis fluid  REVIEW OF SYSTEMS:  Review of Systems  Constitutional: Negative for chills, fever and malaise/fatigue.  HENT: Negative for congestion, hearing loss and nosebleeds.   Eyes: Negative for blurred vision and double vision.  Respiratory: Negative for cough, shortness of breath and wheezing.   Cardiovascular: Negative for chest pain, palpitations and leg swelling.  Gastrointestinal: Positive for abdominal pain and nausea. Negative for constipation, diarrhea and vomiting.  Genitourinary: Negative for dysuria and urgency.  Musculoskeletal: Positive for myalgias.  Neurological: Negative for dizziness, speech change, focal weakness, seizures and headaches.  Psychiatric/Behavioral: Negative for depression.    DRUG ALLERGIES:   Allergies  Allergen Reactions  . Ciprofloxacin Hcl Swelling    High fever  . Doxycycline Hyclate Swelling  . Phenol-Glycerin Swelling    VITALS:  Blood pressure 136/85, pulse 86, temperature 99.1 F (37.3 C), temperature source Oral, resp. rate 20, height 5\' 11"  (1.803 m), weight 92.3 kg (203 lb 8 oz), SpO2 93 %.  PHYSICAL EXAMINATION:  Physical Exam  GENERAL:  52 y.o.-year-old patient lying in the bed with no acute distress.  EYES: Pupils equal, round, reactive to light and accommodation. No scleral icterus. Extraocular muscles intact.  HEENT: Head atraumatic, normocephalic. Oropharynx and nasopharynx clear.  NECK:  Supple, no jugular venous distention. No thyroid enlargement, no tenderness.   LUNGS: Normal breath sounds bilaterally, no wheezing, rales,rhonchi or crepitation. No use of accessory muscles of respiration.  CARDIOVASCULAR: S1, S2 normal. No murmurs, rubs, or gallops.  ABDOMEN: Soft, minimal tenderness in the right upper quadrant., nondistended. Bowel sounds present. No organomegaly or mass. Peritoneal dialysis catheter in place EXTREMITIES: No pedal edema, cyanosis, or clubbing.  NEUROLOGIC: Cranial nerves II through XII are intact. Muscle strength 5/5 in all extremities. Sensation intact. Gait not checked.  PSYCHIATRIC: The patient is alert and oriented x 3.  SKIN: No obvious rash, lesion, or ulcer.    LABORATORY PANEL:   CBC Recent Labs  Lab 08/08/17 0557  WBC 12.9*  HGB 9.4*  HCT 29.4*  PLT 283   ------------------------------------------------------------------------------------------------------------------  Chemistries  Recent Labs  Lab 08/06/17 0925  08/08/17 0557  NA 142   < > 140  K 3.2*   < > 3.5  CL 110   < > 110  CO2 21*   < > 23  GLUCOSE 172*   < > 105*  BUN 39*   < > 40*  CREATININE 3.93*   < > 4.27*  CALCIUM 8.4*   < > 8.0*  AST 26  --   --   ALT 30  --   --   ALKPHOS 147*  --   --   BILITOT 0.6  --   --    < > = values in this interval not displayed.   ------------------------------------------------------------------------------------------------------------------  Cardiac Enzymes Recent Labs  Lab 08/06/17 2310  TROPONINI <0.03   ------------------------------------------------------------------------------------------------------------------  RADIOLOGY:  Ct Abdomen Pelvis Wo Contrast  Result Date: 08/06/2017 CLINICAL DATA:  Abdominal pain for several hours EXAM: CT  ABDOMEN AND PELVIS WITHOUT CONTRAST TECHNIQUE: Multidetector CT imaging of the abdomen and pelvis was performed following the standard protocol without IV contrast. COMPARISON:  Ultrasound from earlier in the same day, CT from 05/14/2015 FINDINGS: Lower  chest: Mild atelectatic changes are noted in the bases bilaterally. Hepatobiliary: Gallbladder is well distended with multiple dependent gallstones. No pericholecystic fluid is noted. The liver is within normal limits. Pancreas: Unremarkable. No pancreatic ductal dilatation or surrounding inflammatory changes. Spleen: Within normal limits. Adrenals/Urinary Tract: The left adrenal gland remains enlarged similar to that seen on prior CT from 2016 left kidney again demonstrates changes consistent with prior embolization. Small exophytic cyst is identified. Additionally scarring is noted surrounding the left kidney related to the prior hematoma. The right kidney shows some cystic change. No obstructive changes are noted. Bladder is within normal limits. Superior to the right kidney and in the area of the right adrenal gland, there is an area of increased attenuation consistent with acute hemorrhage. This interdigitates inferiorly along the psoas muscle on the right as well as superiorly along the posterior margin of the liver. Stomach/Bowel: Appendix is not well visualized. No obstructive or inflammatory changes are noted. Scattered diverticular change of the sigmoid and descending colons is seen. Vascular/Lymphatic: Aortic calcifications are noted without aneurysmal dilatation. Retroperitoneal lymph nodes are identified but relatively stable from previous exam. Reproductive: Prostate is unremarkable. Other: Peritoneal dialysis catheter is noted in the right lower quadrant. Small amount of free fluid is noted consistent with the peritoneal dialysate. Musculoskeletal: No acute or significant osseous findings. IMPRESSION: Changes in the region of the right adrenal gland consistent with acute hemorrhage. This courses both inferiorly and superiorly from the region of the right adrenal and is likely the etiology of the patient's underlying discomfort. This may be related to the known history of amyloidosis. Correlate with  any recent blood thinners. Changes consistent with prior embolization left kidney related to renal hematoma and hemorrhage. Mild free fluid consistent with the history of peritoneal dialysis. Cholelithiasis without complicating factors. Electronically Signed   By: Inez Catalina M.D.   On: 08/06/2017 14:05   Ct Chest Wo Contrast  Result Date: 08/06/2017 CLINICAL DATA:  Right-sided chest pain.  Amyloidosis. EXAM: CT CHEST WITHOUT CONTRAST TECHNIQUE: Multidetector CT imaging of the chest was performed following the standard protocol without IV contrast. COMPARISON:  05/02/2017. FINDINGS: Cardiovascular: Mild coronary artery calcification. Heart is at the upper limits of normal in size. No pericardial effusion. Mediastinum/Nodes: Mediastinal lymph nodes measure up to 1.2 cm in the low right paratracheal station, as before. Subcarinal lymph nodes measure up to 1.5 cm, also stable. Hilar regions are difficult to evaluate without IV contrast. No axillary adenopathy. Esophagus is grossly unremarkable. Lungs/Pleura: Mild bibasilar scarring and atelectasis. Lungs are otherwise clear. Multiple extrapleural lesions are seen in the posterior hemi thoraces bilaterally, measuring up to 1.5 x 2.7 cm on the right, likely neurogenic in origin. No pleural fluid. Airway is unremarkable. Upper Abdomen: Visualized portion of the liver is unremarkable. Trace perihepatic fluid. Stones are seen in the gallbladder. Large area of high attenuation in the right suprarenal fossa measures at least 5.2 x 6.2 cm, incompletely imaged, and likely arises from the right adrenal gland, new from 05/02/2017. Fat necrosis adjacent to the posterior margin of the upper pole left kidney, incompletely imaged. Spleen appears enlarged but is incompletely imaged. Small perisplenic fluid. Visualized portions of the pancreas, stomach and bowel are grossly unremarkable. Musculoskeletal: Degenerative changes in the spine. No worrisome lytic or  sclerotic lesions.  IMPRESSION: 1. Large area of incompletely imaged high attenuation in the right suprarenal fossa most likely represents right adrenal hemorrhage and may relate to the patient's history of amyloidosis. 2. No acute findings in the chest. 3. Small ascites. 4. Coronary artery calcification. 5. Mild mediastinal adenopathy, unchanged. 6. Cholelithiasis. 7. Spleen appears enlarged but is incompletely imaged. Electronically Signed   By: Lorin Picket M.D.   On: 08/06/2017 10:11   Dg Chest Port 1 View  Result Date: 08/06/2017 CLINICAL DATA:  Right-sided chest pain for several hours EXAM: PORTABLE CHEST 1 VIEW COMPARISON:  01/29/2017 FINDINGS: The heart size and mediastinal contours are within normal limits. Both lungs are clear. The visualized skeletal structures are unremarkable. IMPRESSION: No active disease. Electronically Signed   By: Inez Catalina M.D.   On: 08/06/2017 09:44   US Abdomen Limited Ruq  Result Date: 08/06/2017 CLINICAL DATA:  Right upper quadrant pain x4 hours EXAM: ULTRASOUND ABDOMEN LIMITED RIGHT UPPER QUADRANT COMPARISON:  None. FINDINGS: Gallbladder: Multiple layering gallstones. 5 mm adherent gallstone versus gallbladder polyp. No gallbladder wall thickening or pericholecystic fluid. Common bile duct: Diameter: 4 mm Liver: Hyperechoic hepatic parenchyma, suggesting hepatic steatosis. No focal hepatic lesion is seen. Portal vein is patent on color Doppler imaging with normal direction of blood flow towards the liver. Additional comments: Trace perihepatic ascites. IMPRESSION: Mild cholelithiasis, without associated sonographic findings to suggest acute cholecystitis. Hyperechoic hepatic parenchyma, suggesting hepatic steatosis. Trace perihepatic ascites. Electronically Signed   By: Julian Hy M.D.   On: 08/06/2017 11:46    EKG:   Orders placed or performed during the hospital encounter of 08/06/17  . ED EKG  . ED EKG  . EKG 12-Lead  . EKG 12-Lead    ASSESSMENT AND PLAN:    52 year old male with past medical history significant for end-stage renal disease secondary to amyloidosis on peritoneal dialysis, central retinopathy presents to hospital secondary to chest pain.  1. Chest pain-likely referred pain from his right adrenal gland hemorrhage. -Appreciate cardiology consult. Patient had a complete cardiac workup done prior to being on transplant list which was negative. -No further cardiac workup recommended. Pain control.  2. Right adrenal gland hemorrhage-spontaneous hemorrhage -Appreciate vascular surgery consult. Patient had prior renal hemorrhage requiring embolization after renal biopsy. -Currently stable hemoglobin, repeat CT today - If worsening, will need embolization.  3. End-stage renal disease-secondary to amyloidosis, on peritoneal dialysis. -Nephrology consult for this same. -On renal transplant list at [facility] -Concerned about color change of her tunneled dialysis fluid.  Nephrology will be notified  4. Amyloidosis- follows at [facility],  -on methotrexate  5.  Right shoulder pain-could be referred pain.  Also underlying gallstones present.  Patient presented with nausea and right shoulder pain. -CT of the abdomen on presentation did not reveal any cholecystitis but the gallbladder wall was distended. -If the adrenal hemorrhage is stable, patient continues to have this pain-would recommend getting a HIDA scan and surgical consult.  6. HTN- losartan  7. DVT prophylaxis- no heparin products due to bleeding - teds and scds   All the records are reviewed and case discussed with Care Management/Social Workerr. Management plans discussed with the patient, family and they are in agreement.  CODE STATUS: Full Code  TOTAL TIME TAKING CARE OF THIS PATIENT: 37 minutes.   POSSIBLE D/C IN 2-3 DAYS, DEPENDING ON CLINICAL CONDITION.   Gladstone Lighter M.D on 08/08/2017 at 9:11 AM  Between 7am to 6pm - Pager - [phone]  After 6pm go to  [email or web address] - password [email or web address] [facility]  Office  [phone]  CC: Primary care physician; Alycia Rossetti, MD

## 2017-08-08 NOTE — Plan of Care (Signed)
  Progressing Pain Managment: General experience of comfort will improve 08/08/2017 2142 - Progressing by Jeri Cos, RN

## 2017-08-08 NOTE — Progress Notes (Signed)
[facility]  Daily Progress Note   Subjective  - * No surgery found *  No major events.  Pain and nausea are better today.  No fevers or chills. CT scan of the abdomen and pelvis was performed which I have independently viewed.  This demonstrates exactly the same size right adrenal hematoma.  No evidence of worsening or ongoing bleeding.  Objective Vitals:   08/07/17 1937 08/08/17 0345 08/08/17 0353 08/08/17 0800  BP: (!) 152/85 (!) 158/96  136/85  Pulse: 90 88  86  Resp: 20     Temp: 99.1 F (37.3 C) 99.1 F (37.3 C)    TempSrc: Oral Oral    SpO2: 94% 92%  93%  Weight:   92.3 kg (203 lb 8 oz)   Height:        Intake/Output Summary (Last 24 hours) at 08/08/2017 1715 Last data filed at 08/08/2017 1708 Gross per 24 hour  Intake 1970 ml  Output 1715 ml  Net 255 ml    PULM  CTAB CV  RRR VASC  PD catheter in place and working well.  Laboratory CBC    Component Value Date/Time   WBC 12.9 (H) 08/08/2017 0557   HGB 9.4 (L) 08/08/2017 0557   HGB 9.9 (L) 04/06/2014 0754   HCT 29.4 (L) 08/08/2017 0557   HCT 30.7 (L) 04/06/2014 0754   PLT 283 08/08/2017 0557   PLT 273 04/06/2014 0754    BMET    Component Value Date/Time   NA 140 08/08/2017 0557   NA 141 04/06/2014 0754   K 3.5 08/08/2017 0557   K 5.2 (H) 04/06/2014 0754   CL 110 08/08/2017 0557   CL 111 (H) 04/06/2014 0754   CO2 23 08/08/2017 0557   CO2 24 04/06/2014 0754   GLUCOSE 105 (H) 08/08/2017 0557   GLUCOSE 104 (H) 04/06/2014 0754   BUN 40 (H) 08/08/2017 0557   BUN 39 (H) 04/06/2014 0754   CREATININE 4.27 (H) 08/08/2017 0557   CREATININE 4.70 (H) 12/22/2015 0832   CALCIUM 8.0 (L) 08/08/2017 0557   CALCIUM 8.4 (L) 04/06/2014 0754   GFRNONAA 14 (L) 08/08/2017 0557   GFRNONAA 13 (L) 12/22/2015 0832   GFRAA 17 (L) 08/08/2017 0557   GFRAA 15 (L) 12/22/2015 1219    Assessment/Planning:    Right adrenal hemorrhage.  Spontaneous.  This is stable and ongoing bleeding does not  appear to be present.  CT scan today is the same as it was 2 days ago.  Hemoglobin has been basically stable.  At this point, it does not appears if you will require embolization as there is not ongoing bleeding.  Can likely resume normal activities over the next week and be discharged in the next 1-2 days.  End-stage renal disease.  Tolerating peritoneal dialysis well.    Leotis Pain  08/08/2017, 5:15 PM

## 2017-08-08 NOTE — Progress Notes (Signed)
[facility]  ROUNDING NOTE   Subjective:  Patient did have some slightly bloody PD effluent this a.m. However this is to be expected given his adrenal hemorrhage. Size of hemorrhage did not significantly increase on repeat CT scan.   Objective:  Vital signs in last 24 hours:  Temp:  [99.1 F (37.3 C)] 99.1 F (37.3 C) (02/13 0345) Pulse Rate:  [86-90] 86 (02/13 0800) Resp:  [20] 20 (02/12 1937) BP: (136-158)/(85-96) 136/85 (02/13 0800) SpO2:  [92 %-94 %] 93 % (02/13 0800) Weight:  [92.3 kg (203 lb 8 oz)] 92.3 kg (203 lb 8 oz) (02/13 0353)  Weight change: 6.124 kg (13 lb 8 oz) Filed Weights   08/06/17 0926 08/07/17 0450 08/08/17 0353  Weight: 86.2 kg (190 lb) 89.5 kg (197 lb 4.8 oz) 92.3 kg (203 lb 8 oz)    Intake/Output: I/O last 3 completed shifts: In: 2633.3 [P.O.:240; I.V.:2393.3] Out: 1248 [Urine:450; Other:798]   Intake/Output this shift:  Total I/O In: 943.3 [P.O.:240; I.V.:703.3] Out: 1565 [Urine:450; Other:1115]  Physical Exam: General: No acute distress  Head: Normocephalic, atraumatic. Moist oral mucosal membranes  Eyes: Anicteric  Neck: Supple, trachea midline  Lungs:  Clear to auscultation, normal effort  Heart: S1S2 no rubs  Abdomen:  Soft, mild tenderness  Extremities: No peripheral edema.  Neurologic: Awake, alert, following commands  Skin: No lesions  Access: PD catheter in place    Basic Metabolic Panel: Recent Labs  Lab 08/06/17 0925 08/07/17 0627 08/08/17 0557  NA 142 142 140  K 3.2* 3.6 3.5  CL 110 110 110  CO2 21* 23 23  GLUCOSE 172* 201* 105*  BUN 39* 40* 40*  CREATININE 3.93* 4.04* 4.27*  CALCIUM 8.4* 8.4* 8.0*    Liver Function Tests: Recent Labs  Lab 08/06/17 0925  AST 26  ALT 30  ALKPHOS 147*  BILITOT 0.6  PROT 5.9*  ALBUMIN 2.9*   No results for input(s): LIPASE, AMYLASE in the last 168 hours. No results for input(s): AMMONIA in the last 168 hours.  CBC: Recent Labs  Lab 08/06/17 0925  08/07/17 0627 08/07/17 1015 08/07/17 1649 08/08/17 0557  WBC 10.9* 15.7*  --   --  12.9*  NEUTROABS 6.7*  --   --   --   --   HGB 10.3* 9.9* 9.7* 9.6* 9.4*  HCT 31.8* 31.1* 30.6* 29.9* 29.4*  MCV 99.4 100.5*  --   --  99.8  PLT 324 338  --   --  283    Cardiac Enzymes: Recent Labs  Lab 08/06/17 0925 08/06/17 1549 08/06/17 2310  TROPONINI <0.03 <0.03 <0.03    BNP: Invalid input(s): POCBNP  CBG: No results for input(s): GLUCAP in the last 168 hours.  Microbiology: Results for orders placed or performed during the hospital encounter of 08/06/17  MRSA PCR Screening     Status: None   Collection Time: 08/06/17  3:47 PM  Result Value Ref Range Status   MRSA by PCR NEGATIVE NEGATIVE Final    Comment:        The GeneXpert MRSA Assay (FDA approved for NASAL specimens only), is one component of a comprehensive MRSA colonization surveillance program. It is not intended to diagnose MRSA infection nor to guide or monitor treatment for MRSA infections. Performed at [facility], [address] [place], [place] [address]     Coagulation Studies: No results for input(s): LABPROT, INR in the last 72 hours.  Urinalysis: No results for input(s): COLORURINE, LABSPEC, [place], [place],  HGBUR, BILIRUBINUR, KETONESUR, PROTEINUR, UROBILINOGEN, NITRITE, LEUKOCYTESUR in the last 72 hours.  Invalid input(s): APPERANCEUR    Imaging: Ct Abdomen Pelvis Wo Contrast  Result Date: 08/08/2017 CLINICAL DATA:  Follow-up adrenal hemorrhage. EXAM: CT ABDOMEN AND PELVIS WITHOUT CONTRAST TECHNIQUE: Multidetector CT imaging of the abdomen and pelvis was performed following the standard protocol without IV contrast. COMPARISON:  CT abdomen pelvis dated August 06, 2017. FINDINGS: Lower chest: New small right and trace left pleural effusions with adjacent atelectasis. Hepatobiliary: Hepatic steatosis. No focal liver abnormality. Cholelithiasis. No biliary dilatation. Pancreas:  Unremarkable. No pancreatic ductal dilatation or surrounding inflammatory changes. Spleen: Mild splenomegaly, unchanged.  No focal abnormality. Adrenals/Urinary Tract: Relatively unchanged size of right adrenal hematoma, measuring 6.3 x 6.0 cm currently, previously 6.2 x 5.9 cm by my measurements. Small amount of hemorrhage extending inferiorly along the right psoas muscle. The left adrenal gland remains enlarged without focal nodule. Unchanged 3.5 cm exophytic right renal cyst. Mild left renal atrophy due to prior embolization. Scarring around the left kidney related to prior hematoma is again noted. No renal or ureteral calculi. No hydronephrosis. The bladder is unremarkable. Stomach/Bowel: Stomach is within normal limits. Appendix is not visualized. No evidence of bowel wall thickening, distention, or inflammatory changes. Vascular/Lymphatic: Prominent, mildly enlarged retroperitoneal lymph nodes measuring up to 1.3 cm in short axis are unchanged. Aortic atherosclerosis. Reproductive: Prostate is unremarkable. Other: Trace free fluid in the pelvis, likely related to peritoneal dialysate. Peritoneal dialysis catheter again noted in the right lower quadrant. No pneumoperitoneum. Musculoskeletal: No acute or significant osseous findings. IMPRESSION: 1. Unchanged right adrenal gland hematoma. 2. Hepatic steatosis.  Aortic atherosclerosis (ICD10-I70.0). 3. Cholelithiasis. 4. Unchanged mild splenomegaly. 5. New small right and trace left pleural effusions with adjacent atelectasis. 6. Stable retroperitoneal adenopathy. Electronically Signed   By: Titus Dubin M.D.   On: 08/08/2017 09:15     Medications:   . dialysis solution 2.5% low-MG/low-CA     . [START ON 08/23/2017] Aflibercept  1 Dose Intravitreal Q30 days  . atorvastatin  20 mg Oral Daily  . docusate sodium  200 mg Oral BID  . folic acid  5 mg Oral Daily  . gentamicin cream  1 application Topical QHS  . gentamicin cream  1 application Topical  Daily  . losartan  100 mg Oral Daily  . methotrexate  10 mg Oral Weekly  . ondansetron (ZOFRAN) IV  4 mg Intravenous Q6H  . pantoprazole  40 mg Oral BID AC  . patiromer  1 packet Oral Daily  . polyethylene glycol  17 g Oral Daily   acetaminophen **OR** acetaminophen, albuterol, hydrALAZINE, HYDROmorphone (DILAUDID) injection, oxyCODONE, polyvinyl alcohol, promethazine  Assessment/ Plan:  53 y.o. male with past medical history of hypertension, hyperlipidemia, anemia chronic kidney disease, peptic ulcer disease, ESRD on PD, anemia of CKD proteinuria, hyperkalemia, s/p renal biopsy 04/06/15, renal biopsy showed amyloid nephropathy, which is likely hereditary in nature, now admitted with right adrenal gland hemorrhage.  1.  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  -Continue current PD prescription.  2. Anemia of chronic kidney disease.   Hemoglobin down slightly to 9.4.  Stop IV fluids as these may be causing a dilutional effect.  3. Secondary hyperparathyroidism.    Continue to monitor phosphorus, PTH, calcium as an outpatient.   4. Hyperkalemia: Potassium are currently on hold as potassium is low normal at 3.5.  However potassium will likely start going up now that the patient has an improved appetite.  5.  Right adrenal gland hemorrhage:  Suspect secondary to amyloidosis.  Appreciate vascular surgery input.  Repeat CT scan does not show significantly enlarged adrenal hemorrhage.  Continue to monitor clinically.  Avoid embolization if possible as this could compromise his remaining renal function as the adrenal gland and kidney share some vasculature.    LOS: 0 Brizeyda Holtmeyer 2/13/20194:27 PM

## 2017-08-08 NOTE — Care Management (Signed)
Repeating abdominal CT today.

## 2017-08-08 NOTE — Progress Notes (Signed)
Notified Dr. Holley Raring about orange tinged color change in dwelling. No new orders.

## 2017-08-09 DIAGNOSIS — N186 End stage renal disease: Secondary | ICD-10-CM | POA: Diagnosis not present

## 2017-08-09 DIAGNOSIS — Z992 Dependence on renal dialysis: Secondary | ICD-10-CM | POA: Diagnosis not present

## 2017-08-09 LAB — BASIC METABOLIC PANEL
ANION GAP: 9 (ref 5–15)
BUN: 50 mg/dL — ABNORMAL HIGH (ref 6–20)
CALCIUM: 8.4 mg/dL — AB (ref 8.9–10.3)
CHLORIDE: 108 mmol/L (ref 101–111)
CO2: 24 mmol/L (ref 22–32)
Creatinine, Ser: 5.02 mg/dL — ABNORMAL HIGH (ref 0.61–1.24)
GFR calc Af Amer: 14 mL/min — ABNORMAL LOW (ref >60)
GFR calc non Af Amer: 12 mL/min — ABNORMAL LOW (ref >60)
GLUCOSE: 124 mg/dL — AB (ref 65–99)
Potassium: 3.5 mmol/L (ref 3.5–5.1)
Sodium: 141 mmol/L (ref 135–145)

## 2017-08-09 LAB — CBC
HEMATOCRIT: 29.2 % — AB (ref 40.0–52.0)
HEMOGLOBIN: 9.3 g/dL — AB (ref 13.0–18.0)
MCH: 31.7 pg (ref 26.0–34.0)
MCHC: 31.9 g/dL — AB (ref 32.0–36.0)
MCV: 99.3 fL (ref 80.0–100.0)
Platelets: 289 10*3/uL (ref 150–440)
RBC: 2.94 MIL/uL — AB (ref 4.40–5.90)
RDW: 19.9 % — ABNORMAL HIGH (ref 11.5–14.5)
WBC: 11.3 10*3/uL — ABNORMAL HIGH (ref 3.8–10.6)

## 2017-08-09 MED ORDER — OXYCODONE-ACETAMINOPHEN 10-325 MG PO TABS: 1.0000 | ORAL_TABLET | Freq: Four times a day (QID) | ORAL | 0 refills | Status: AC | PRN

## 2017-08-09 MED ORDER — POLYETHYLENE GLYCOL 3350 17 G PO PACK: 17.0000 g | PACK | Freq: Every day | ORAL | 0 refills | Status: DC

## 2017-08-09 MED ORDER — SENNOSIDES-DOCUSATE SODIUM 8.6-50 MG PO TABS: 2.0000 | ORAL_TABLET | Freq: Every day | ORAL | 1 refills | Status: AC

## 2017-08-09 NOTE — Progress Notes (Signed)
[facility]  ROUNDING NOTE   Subjective:  Patient appears to have a bit more pain this a.m. is compared to yesterday. He still has bloody PD effluent. Otherwise appears to be tolerating peritoneal dialysis well.   Objective:  Vital signs in last 24 hours:  Temp:  [98.9 F (37.2 C)-99 F (37.2 C)] 98.9 F (37.2 C) (02/14 0809) Pulse Rate:  [92-93] 92 (02/14 0809) Resp:  [16-17] 16 (02/14 0809) BP: (121-135)/(72-93) 134/93 (02/14 0809) SpO2:  [94 %-96 %] 96 % (02/14 0809) Weight:  [91.5 kg (201 lb 11.2 oz)] 91.5 kg (201 lb 11.2 oz) (02/14 0329)  Weight change: -0.816 kg (-12.8 oz) Filed Weights   08/07/17 0450 08/08/17 0353 08/09/17 0329  Weight: 89.5 kg (197 lb 4.8 oz) 92.3 kg (203 lb 8 oz) 91.5 kg (201 lb 11.2 oz)    Intake/Output: I/O last 3 completed shifts: In: 2210 [P.O.:720; I.V.:1490] Out: 2115 [Urine:1000; Other:1115]   Intake/Output this shift:  No intake/output data recorded.  Physical Exam: General: No acute distress  Head: Normocephalic, atraumatic. Moist oral mucosal membranes  Eyes: Anicteric  Neck: Supple, trachea midline  Lungs:  Clear to auscultation, normal effort  Heart: S1S2 no rubs  Abdomen:  Soft, mild tenderness  Extremities: No peripheral edema.  Neurologic: Awake, alert, following commands  Skin: No lesions  Access: PD catheter in place    Basic Metabolic Panel: Recent Labs  Lab 08/06/17 0925 08/07/17 0627 08/08/17 0557 08/09/17 0445  NA 142 142 140 141  K 3.2* 3.6 3.5 3.5  CL 110 110 110 108  CO2 21* 23 23 24   GLUCOSE 172* 201* 105* 124*  BUN 39* 40* 40* 50*  CREATININE 3.93* 4.04* 4.27* 5.02*  CALCIUM 8.4* 8.4* 8.0* 8.4*    Liver Function Tests: Recent Labs  Lab 08/06/17 0925  AST 26  ALT 30  ALKPHOS 147*  BILITOT 0.6  PROT 5.9*  ALBUMIN 2.9*   No results for input(s): LIPASE, AMYLASE in the last 168 hours. No results for input(s): AMMONIA in the last 168 hours.  CBC: Recent Labs  Lab  08/06/17 0925 08/07/17 0627 08/07/17 1015 08/07/17 1649 08/08/17 0557 08/08/17 1650 08/09/17 0445  WBC 10.9* 15.7*  --   --  12.9*  --  11.3*  NEUTROABS 6.7*  --   --   --   --   --   --   HGB 10.3* 9.9* 9.7* 9.6* 9.4* 8.5* 9.3*  HCT 31.8* 31.1* 30.6* 29.9* 29.4* 26.8* 29.2*  MCV 99.4 100.5*  --   --  99.8  --  99.3  PLT 324 338  --   --  283  --  289    Cardiac Enzymes: Recent Labs  Lab 08/06/17 0925 08/06/17 1549 08/06/17 2310  TROPONINI <0.03 <0.03 <0.03    BNP: Invalid input(s): POCBNP  CBG: No results for input(s): GLUCAP in the last 168 hours.  Microbiology: Results for orders placed or performed during the hospital encounter of 08/06/17  MRSA PCR Screening     Status: None   Collection Time: 08/06/17  3:47 PM  Result Value Ref Range Status   MRSA by PCR NEGATIVE NEGATIVE Final    Comment:        The GeneXpert MRSA Assay (FDA approved for NASAL specimens only), is one component of a comprehensive MRSA colonization surveillance program. It is not intended to diagnose MRSA infection nor to guide or monitor treatment for MRSA infections. Performed at [facility], [place],  [place], [place] [address]     Coagulation Studies: No results for input(s): LABPROT, INR in the last 72 hours.  Urinalysis: No results for input(s): COLORURINE, LABSPEC, PHURINE, GLUCOSEU, HGBUR, BILIRUBINUR, KETONESUR, PROTEINUR, UROBILINOGEN, NITRITE, LEUKOCYTESUR in the last 72 hours.  Invalid input(s): APPERANCEUR    Imaging: Ct Abdomen Pelvis Wo Contrast  Result Date: 08/08/2017 CLINICAL DATA:  Follow-up adrenal hemorrhage. EXAM: CT ABDOMEN AND PELVIS WITHOUT CONTRAST TECHNIQUE: Multidetector CT imaging of the abdomen and pelvis was performed following the standard protocol without IV contrast. COMPARISON:  CT abdomen pelvis dated August 06, 2017. FINDINGS: Lower chest: New small right and trace left pleural effusions with adjacent atelectasis.  Hepatobiliary: Hepatic steatosis. No focal liver abnormality. Cholelithiasis. No biliary dilatation. Pancreas: Unremarkable. No pancreatic ductal dilatation or surrounding inflammatory changes. Spleen: Mild splenomegaly, unchanged.  No focal abnormality. Adrenals/Urinary Tract: Relatively unchanged size of right adrenal hematoma, measuring 6.3 x 6.0 cm currently, previously 6.2 x 5.9 cm by my measurements. Small amount of hemorrhage extending inferiorly along the right psoas muscle. The left adrenal gland remains enlarged without focal nodule. Unchanged 3.5 cm exophytic right renal cyst. Mild left renal atrophy due to prior embolization. Scarring around the left kidney related to prior hematoma is again noted. No renal or ureteral calculi. No hydronephrosis. The bladder is unremarkable. Stomach/Bowel: Stomach is within normal limits. Appendix is not visualized. No evidence of bowel wall thickening, distention, or inflammatory changes. Vascular/Lymphatic: Prominent, mildly enlarged retroperitoneal lymph nodes measuring up to 1.3 cm in short axis are unchanged. Aortic atherosclerosis. Reproductive: Prostate is unremarkable. Other: Trace free fluid in the pelvis, likely related to peritoneal dialysate. Peritoneal dialysis catheter again noted in the right lower quadrant. No pneumoperitoneum. Musculoskeletal: No acute or significant osseous findings. IMPRESSION: 1. Unchanged right adrenal gland hematoma. 2. Hepatic steatosis.  Aortic atherosclerosis (ICD10-I70.0). 3. Cholelithiasis. 4. Unchanged mild splenomegaly. 5. New small right and trace left pleural effusions with adjacent atelectasis. 6. Stable retroperitoneal adenopathy. Electronically Signed   By: Titus Dubin M.D.   On: 08/08/2017 09:15     Medications:   . dialysis solution 2.5% low-MG/low-CA     . [START ON 08/23/2017] Aflibercept  1 Dose Intravitreal Q30 days  . atorvastatin  20 mg Oral Daily  . docusate sodium  200 mg Oral BID  . folic acid  5  mg Oral Daily  . gentamicin cream  1 application Topical QHS  . gentamicin cream  1 application Topical Daily  . losartan  100 mg Oral Daily  . methotrexate  10 mg Oral Weekly  . pantoprazole  40 mg Oral BID AC  . patiromer  1 packet Oral Daily  . polyethylene glycol  17 g Oral Daily   acetaminophen **OR** acetaminophen, albuterol, hydrALAZINE, HYDROmorphone (DILAUDID) injection, oxyCODONE, polyvinyl alcohol, promethazine  Assessment/ Plan:  52 y.o. male with past medical history of hypertension, hyperlipidemia, anemia chronic kidney disease, peptic ulcer disease, ESRD on PD, anemia of CKD proteinuria, hyperkalemia, s/p renal biopsy 04/06/15, renal biopsy showed amyloid nephropathy, which is likely hereditary in nature, now admitted with right adrenal gland hemorrhage.  1.  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  -Patient continues to have slightly bloody PD effluent which is to be expected given his adrenal gland hemorrhage.  Continue peritoneal dialysis as prescribed for now.  2. Anemia of chronic kidney disease.   Overall hemoglobin appears to be relatively stable at 9.3.  Does not appear to be bleeding at the moment.  3. Secondary hyperparathyroidism.  Serum calcium 8.4 at the moment.  Continue to monitor phosphorus as an outpatient.  4. Hyperkalemia: Serum potassium 3.5 this a.m.  Patient has been maintained on Velt[place].  5.  Right adrenal gland hemorrhage:  Suspect secondary to amyloidosis.  Appreciate vascular surgery input.  Repeat CT scan did not show significantly enlarged adrenal hemorrhage.  His hemoglobin is stable at 9.3.  Therefore it does not appears that he has active bleeding from the adrenal gland.  Would obtain vascular input prior to discharge.   LOS: 1 Joseph [name] 2/14/201911:57 AM

## 2017-08-09 NOTE — Progress Notes (Signed)
Went over discharge instructions with the patient and wife including medications and follow up appointment. Discontinue peripheral IV and telemetry monitor. Volunteer is here for transport.

## 2017-08-09 NOTE — Discharge Summary (Signed)
[name]    MR#:  [id number]  DATE OF BIRTH:  April 24, 1966  DATE OF ADMISSION:  08/06/2017   ADMITTING PHYSICIAN: Joseph Flock, MD  DATE OF DISCHARGE: 08/09/2017 12:30 PM  PRIMARY CARE PHYSICIAN: Joseph Rossetti, MD   ADMISSION DIAGNOSIS:   Gallstones [K80.20] Pain [R52] Chest pain, unspecified type [R07.9]  DISCHARGE DIAGNOSIS:   Active Problems:   Chest pain   SECONDARY DIAGNOSIS:   Past Medical History:  Diagnosis Date  . Amyloidosis ([place])   . Blood clot in abdominal vein    happened around age 89 due to traumatic injury  . Blood dyscrasia    amyloidosis  . Central serous chorioretinopathy   . Dry eye   . GERD (gastroesophageal reflux disease)   . Heart murmur    never followed up with this, no problems  . Hyperlipidemia   . Hypertension    improved since starting dialysis  . Pneumonia    as child  . Renal insufficiency     HOSPITAL COURSE:   52 year old male with past medical history significant for end-stage renal disease secondary to amyloidosis on peritoneal dialysis, central retinopathy presents to hospital secondary to chest pain.  1. Chest pain-likely referred pain from his right adrenal gland hemorrhage. Also has referred pain to back and right shoulder -Appreciate cardiology consult. Patient had a complete cardiac workup done prior to being on transplant list which was negative. -No further cardiac workup recommended. Pain control.  2. Right adrenal gland hemorrhage-spontaneous hemorrhage -Appreciate vascular surgery consult. Patient had prior renal hemorrhage after a renal biopsy requiring embolization after renal biopsy. - stable hemoglobin all through the hospital stay. -Repeat CT of the abdomen with stable hemorrhage. Patient will be discharged today  3. End-stage renal disease-secondary to amyloidosis, on peritoneal dialysis. -Nephrology consulted while in  the hospital -On renal transplant list at [facility]  4. Amyloidosis- follows at [facility],  -on methotrexate  5.  Right shoulder pain- referred pain from his right adrenal hemorrhage.  Also underlying gallstones present on CT abd but no evidence of cholecystitis- monitor as outpatient. - pain improving  6. HTN- losartan  7. Constipation- meds added, advised to avoid straining  Stable, ambulating. Discharge home today- discussed with the consulting team.    DISCHARGE CONDITIONS:   Guarded  CONSULTS OBTAINED:   Treatment Team:  Joseph Legato, MD  DRUG ALLERGIES:   Allergies  Allergen Reactions  . Ciprofloxacin Hcl Swelling    High fever  . Doxycycline Hyclate Swelling  . Phenol-Glycerin Swelling   DISCHARGE MEDICATIONS:   Allergies as of 08/09/2017      Reactions   Ciprofloxacin Hcl Swelling   High fever   Doxycycline Hyclate Swelling   Phenol-glycerin Swelling      Medication List    STOP taking these medications   CEPHALEXIN PO     TAKE these medications   albuterol 108 (90 Base) MCG/ACT inhaler Commonly known as:  PROVENTIL HFA;VENTOLIN HFA Inhale 2 puffs into the lungs every 6 (six) hours as needed for wheezing or shortness of breath.   ARTIFICIAL TEARS OP Apply 1 drop to eye as needed.   atorvastatin 20 MG tablet Commonly known as:  LIPITOR TAKE 1 TABLET BY MOUTH ONCE DAILY   cycloSPORINE 0.05 % ophthalmic emulsion Commonly known as:  RESTASIS Place 1 drop into both eyes daily.   EPOGEN IJ Inject as directed every 30 (thirty) days. Administered at Dialysis center.  EYLEA 2 MG/0.05ML Soln Generic drug:  Aflibercept 1 Dose by Intravitreal route every 30 (thirty) days.   folic acid 1 MG tablet Commonly known as:  FOLVITE Take 5 mg by mouth daily.   gentamicin cream 0.1 % Commonly known as:  GARAMYCIN Apply 1 application topically at bedtime.   losartan 100 MG tablet Commonly known as:  COZAAR Take 1 tablet (100 mg total) by mouth daily.     methotrexate 2.5 MG tablet Commonly known as:  RHEUMATREX Take 10 mg by mouth once a week.   ondansetron 4 MG disintegrating tablet Commonly known as:  ZOFRAN-ODT Take 1 tablet (4 mg total) by mouth every 8 (eight) hours as needed for nausea or vomiting.   oxyCODONE-acetaminophen 10-325 MG tablet Commonly known as:  PERCOCET Take 1 tablet by mouth every 6 (six) hours as needed for up to 3 days for pain.   pantoprazole 40 MG tablet Commonly known as:  PROTONIX Take 1 tablet (40 mg total) by mouth 2 (two) times daily before a meal. What changed:  when to take this   polyethylene glycol packet Commonly known as:  MIRALAX / GLYCOLAX Take 17 g by mouth daily. Start taking on:  08/10/2017   senna-docusate 8.6-50 MG tablet Commonly known as:  Senokot-S Take 2 tablets by mouth at bedtime. Hold if having diarrhea   VELTASSA 8.4 g packet Generic drug:  patiromer Take 1 packet by mouth daily.        DISCHARGE INSTRUCTIONS:   1. PCP f/u in 1 week 2. Nephrology f/u as prior scheduled 3. No heavy lifting or exertional activity for 1 week  DIET:   Renal diet  ACTIVITY:   Activity as tolerated  OXYGEN:   Home Oxygen: No.  Oxygen Delivery: room air  DISCHARGE LOCATION:   home   If you experience worsening of your admission symptoms, develop shortness of breath, life threatening emergency, suicidal or homicidal thoughts you must seek medical attention immediately by calling 911 or calling your MD immediately  if symptoms less severe.  You Must read complete instructions/literature along with all the possible adverse reactions/side effects for all the Medicines you take and that have been prescribed to you. Take any new Medicines after you have completely understood and accpet all the possible adverse reactions/side effects.   Please note  You were cared for by a hospitalist during your hospital stay. If you have any questions about your discharge medications or the care  you received while you were in the hospital after you are discharged, you can call the unit and asked to speak with the hospitalist on call if the hospitalist that took care of you is not available. Once you are discharged, your primary care physician will handle any further medical issues. Please note that NO REFILLS for any discharge medications will be authorized once you are discharged, as it is imperative that you return to your primary care physician (or establish a relationship with a primary care physician if you do not have one) for your aftercare needs so that they can reassess your need for medications and monitor your lab values.    On the day of Discharge:  VITAL SIGNS:   Blood pressure (!) 134/93, pulse 92, temperature 98.9 F (37.2 C), temperature source Oral, resp. rate 16, height 5\' 11"  (1.803 m), weight 91.5 kg (201 lb 11.2 oz), SpO2 96 %.  PHYSICAL EXAMINATION:    GENERAL:  52 y.o.-year-old patient lying in the bed with no acute distress.  EYES: Pupils equal, round, reactive to light and accommodation. No scleral icterus. Extraocular muscles intact.  HEENT: Head atraumatic, normocephalic. Oropharynx and nasopharynx clear.  NECK:  Supple, no jugular venous distention. No thyroid enlargement, no tenderness.  LUNGS: Normal breath sounds bilaterally, no wheezing, rales,rhonchi or crepitation. No use of accessory muscles of respiration.  CARDIOVASCULAR: S1, S2 normal. No murmurs, rubs, or gallops.  ABDOMEN: Soft, non tender., nondistended. Bowel sounds present. No organomegaly or mass. Peritoneal dialysis catheter in place EXTREMITIES: No pedal edema, cyanosis, or clubbing.  NEUROLOGIC: Cranial nerves II through XII are intact. Muscle strength 5/5 in all extremities. Sensation intact. Gait not checked.  PSYCHIATRIC: The patient is alert and oriented x 3.  SKIN: No obvious rash, lesion, or ulcer.    DATA REVIEW:   CBC Recent Labs  Lab 08/09/17 0445  WBC 11.3*  HGB 9.3*    HCT 29.2*  PLT 289    Chemistries  Recent Labs  Lab 08/06/17 0925  08/09/17 0445  NA 142   < > 141  K 3.2*   < > 3.5  CL 110   < > 108  CO2 21*   < > 24  GLUCOSE 172*   < > 124*  BUN 39*   < > 50*  CREATININE 3.93*   < > 5.02*  CALCIUM 8.4*   < > 8.4*  AST 26  --   --   ALT 30  --   --   ALKPHOS 147*  --   --   BILITOT 0.6  --   --    < > = values in this interval not displayed.     Microbiology Results  Results for orders placed or performed during the hospital encounter of 08/06/17  MRSA PCR Screening     Status: None   Collection Time: 08/06/17  3:47 PM  Result Value Ref Range Status   MRSA by PCR NEGATIVE NEGATIVE Final    Comment:        The GeneXpert MRSA Assay (FDA approved for NASAL specimens only), is one component of a comprehensive MRSA colonization surveillance program. It is not intended to diagnose MRSA infection nor to guide or monitor treatment for MRSA infections. Performed at [facility], [address], [place], [place] [address]     RADIOLOGY:  No results found.   Management plans discussed with the patient, family and they are in agreement.  CODE STATUS:     Code Status Orders  (From admission, onward)        Start     Ordered   08/06/17 1113  Full code  Continuous     08/06/17 1113    Code Status History    Date Active Date Inactive Code Status Order ID Comments User Context   01/29/2017 14:19 01/31/2017 14:39 Full Code [id number]  Theodoro Grist, MD Inpatient   05/31/2015 09:59 06/01/2015 03:31 Full Code [id number]  Sabino Dick, MD [place]   04/06/2015 12:39 04/21/2015 13:45 Full Code [id number]  Joseph Legato, MD Inpatient      TOTAL TIME TAKING CARE OF THIS PATIENT: 38 minutes.    Gladstone Lighter M.D on 08/09/2017 at 4:29 PM  Between 7am to 6pm - Pager - [phone]  After 6pm go to [email or web address] - Proofreader  [facility]  Office  [phone]  CC: Primary care  physician; Joseph Rossetti, MD   Note: This dictation was prepared with Dragon dictation along with smaller phrase technology. Any transcriptional errors that  result from this process are unintentional.

## 2017-08-10 ENCOUNTER — Other Ambulatory Visit: Payer: Self-pay | Admitting: *Deleted

## 2017-08-10 ENCOUNTER — Encounter: Payer: Self-pay | Admitting: Family Medicine

## 2017-08-10 ENCOUNTER — Ambulatory Visit (INDEPENDENT_AMBULATORY_CARE_PROVIDER_SITE_OTHER): Payer: 59 | Admitting: Family Medicine

## 2017-08-10 VITALS — BP 128/70 | HR 92 | Temp 99.0°F | Resp 16 | Ht 71.0 in | Wt 202.0 lb

## 2017-08-10 DIAGNOSIS — E854 Organ-limited amyloidosis: Secondary | ICD-10-CM | POA: Diagnosis not present

## 2017-08-10 DIAGNOSIS — D62 Acute posthemorrhagic anemia: Secondary | ICD-10-CM

## 2017-08-10 DIAGNOSIS — N186 End stage renal disease: Secondary | ICD-10-CM

## 2017-08-10 DIAGNOSIS — E2749 Other adrenocortical insufficiency: Secondary | ICD-10-CM | POA: Diagnosis not present

## 2017-08-10 DIAGNOSIS — N08 Glomerular disorders in diseases classified elsewhere: Secondary | ICD-10-CM

## 2017-08-10 DIAGNOSIS — Z992 Dependence on renal dialysis: Secondary | ICD-10-CM | POA: Diagnosis not present

## 2017-08-10 LAB — CBC
HCT: 28 % — ABNORMAL LOW (ref 38.5–50.0)
Hemoglobin: 9.2 g/dL — ABNORMAL LOW (ref 13.2–17.1)
MCH: 32.7 pg (ref 27.0–33.0)
MCHC: 32.9 g/dL (ref 32.0–36.0)
MCV: 99.6 fL (ref 80.0–100.0)
PLATELETS: 281 10*3/uL (ref 140–400)
RBC: 2.81 10*6/uL — AB (ref 4.20–5.80)
RDW: 18.3 % — AB (ref 11.0–15.0)
WBC: 10.6 10*3/uL (ref 3.8–10.8)

## 2017-08-10 MED ORDER — HYDROMORPHONE HCL 2 MG PO TABS: 2.0000 mg | ORAL_TABLET | Freq: Four times a day (QID) | ORAL | 0 refills | Status: DC | PRN

## 2017-08-10 NOTE — Assessment & Plan Note (Signed)
Hospital discharge discussed with patient as well as his wife.  Acute blood loss anemia secondary to the spontaneous hemorrhage into the adrenal gland I rechecked his hemoglobin today as he did look quite pale it is stable at 9.2.  I recommend that he have a repeat hemoglobin in 2 weeks per the notes there is good monitor this and hopefully it will resolve on its own without surgical intervention or embolization needed.  For his pain understandably he has quite significant pain with a bleed into the area I would give him Dilaudid 2 mg to use short-term.  He is good to follow-up with his nephrologist next week with regards to the low-grade fever recommend that if his temperature goes above 99.5 which would be significant change from what he has had that he should start his antibiotic regimen as prescribed by his nephrologist and let them know.  No change to his other medications.

## 2017-08-10 NOTE — Progress Notes (Signed)
Subjective:    Patient ID: Joseph [name], male    DOB: Feb 22, 1966, 52 y.o.   MRN: [id number]  Patient presents for F/U ER visit - need referral to pain mangement and Fever (Has had fever on and off x 1 week) Patient here to follow-up hospital admission.  He was admitted to the hospital after having low-grade intermittent fever as well as right-sided chest pain and flank pain.  He was found to have spontaneous adrenal gland hemorrhage which they monitored in the hospital was found to be stable his hemoglobin dropped down to 1 g from 10.3-9.3 was stabilized.  The plan at this time is just to monitor.  There was no sign of peritonitis his nephrologist was present.  Was advised only take his antibiotics if his temperature started to rise his temp has been in the low 99 for the past week.  He is not had any further nausea vomiting.  Has significant pain however in that right side he was given Dilaudid as well as oxycodone in the hospital but discharged on oxycodone which is not helping.  He states that it wears off at about 3 hours.  He is still awaiting the transplant list.  Noted to have incidental gallstones but no sign of cholecystitis we discussed that we would not operate on this unless it became a problem.    Review Of Systems:  GEN- denies fatigue, fever, weight loss,weakness, recent illness HEENT- denies eye drainage, change in vision, nasal discharge, CVS- denies chest pain, palpitations RESP- denies SOB, cough, wheeze ABD- denies N/V, change in stools, abd pain GU- denies dysuria, hematuria, dribbling, incontinence MSK- denies joint pain, muscle aches, injury Neuro- denies headache, dizziness, syncope, seizure activity       Objective:    BP 128/70   Pulse 92   Temp 99 F (37.2 C) (Oral)   Resp 16   Ht 5\' 11"  (1.803 m)   Wt 202 lb (91.6 kg)   SpO2 96%   BMI 28.17 kg/m  GEN- NAD, alert and oriented x3,pale appearing  HEENT- PERRL, EOMI, non injected sclera,  aleconjunctiva, MMM, oropharynx clear Neck- Supple,  CVS- RRR, no murmur RESP-CTAB ABD-NABS,soft,NT,ND, perioteoneal catheter d/c/i, TTP Right flank EXT- No edema Pulses- Radial  2+        Assessment & Plan:      Problem List Items Addressed This Visit      Unprioritized   Renal amyloidosis ([place])   ESRD on peritoneal dialysis [facility])    Hospital discharge discussed with patient as well as his wife.  Acute blood loss anemia secondary to the spontaneous hemorrhage into the adrenal gland I rechecked his hemoglobin today as he did look quite pale it is stable at 9.2.  I recommend that he have a repeat hemoglobin in 2 weeks per the notes there is good monitor this and hopefully it will resolve on its own without surgical intervention or embolization needed.  For his pain understandably he has quite significant pain with a bleed into the area I would give him Dilaudid 2 mg to use short-term.  He is good to follow-up with his nephrologist next week with regards to the low-grade fever recommend that if his temperature goes above 99.5 which would be significant change from what he has had that he should start his antibiotic regimen as prescribed by his nephrologist and let them know.  No change to his other medications.       Other Visit Diagnoses    Acute  blood loss anemia    -  Primary   Relevant Orders   CBC (Completed)   Adrenal hemorrhage ([place])          Note: This dictation was prepared with Dragon dictation along with smaller phrase technology. Any transcriptional errors that result from this process are unintentional.

## 2017-08-10 NOTE — Patient Instructions (Addendum)
Try the Dilaudid for pain Take antibiotics if Temp goes above 99.10F Change F/U TO March - Mid

## 2017-08-10 NOTE — Patient Outreach (Signed)
[place] [facility]) Care Management  08/10/2017  Joseph [name] [facility] 29-Sep-1965 [id number]   Subjective: Telephone call to patient's home number, spoke with wife states Mr. Joseph [name] is currently sleeping, has been runing a temperature, and saw his primary MD today for evaluation, left HIPAA compliant message with wife for Mr. Joseph [name], and requested call back.    Objective: Per KPN ([employer], point of care tool) and chart review, patient hospitalized 08/06/17 -08/09/17 for Chest pain ikely referred pain from his right adrenal gland hemorrhage.  Patient hospitalized  01/29/17 - 01/31/17 for sepsis due to right lower lobe pneumonia.  Patient also has a history of End-stage renal disease-secondary to amyloidosis, on peritoneal dialysis, dry eyes, hypertension, hyperlipidemia, and anemia.   [facility] Care Management completed transition of care follow up call on 02/02/17.     Assessment: Received [facility] Transition of care referral on 08/10/17.  Transition of care follow up pending patient contact.      Plan: RNCM will call patient for 2nd telephone outreach attempt, transition of care follow up, within 10 business days if no return call.      Joseph [name] H. Annia Friendly, BSN, [facility] Telephonic CM Phone: [phone] Fax: [phone]

## 2017-08-11 DIAGNOSIS — Z992 Dependence on renal dialysis: Secondary | ICD-10-CM | POA: Diagnosis not present

## 2017-08-11 DIAGNOSIS — N186 End stage renal disease: Secondary | ICD-10-CM | POA: Diagnosis not present

## 2017-08-12 DIAGNOSIS — Z992 Dependence on renal dialysis: Secondary | ICD-10-CM | POA: Diagnosis not present

## 2017-08-12 DIAGNOSIS — N186 End stage renal disease: Secondary | ICD-10-CM | POA: Diagnosis not present

## 2017-08-13 ENCOUNTER — Encounter: Payer: Self-pay | Admitting: *Deleted

## 2017-08-13 ENCOUNTER — Ambulatory Visit: Payer: Self-pay | Admitting: *Deleted

## 2017-08-13 ENCOUNTER — Other Ambulatory Visit: Payer: Self-pay | Admitting: *Deleted

## 2017-08-13 DIAGNOSIS — N186 End stage renal disease: Secondary | ICD-10-CM | POA: Diagnosis not present

## 2017-08-13 DIAGNOSIS — Z992 Dependence on renal dialysis: Secondary | ICD-10-CM | POA: Diagnosis not present

## 2017-08-13 NOTE — Patient Outreach (Signed)
Joseph [name]) Care Management  08/13/2017  Joseph [name] [facility] 06-15-1966 [id number]   Subjective: Telephone call to patient's home number, no answer, left HIPAA compliant voicemail message, and requested call back.   Objective: Per KPN (Knowledge Performance Now, point of care tool) and chart review, patient hospitalized 08/06/17 -08/09/17 for Chest pain ikely referred pain from his right adrenal gland hemorrhage.  Patient hospitalized 01/29/17 - 01/31/17 for sepsis due to right lower lobe pneumonia.  Patient also has a history of End-stage renal disease-secondary to amyloidosis, on peritoneal dialysis, dry eyes, hypertension, hyperlipidemia, and anemia.   [facility] Care Management completed transition of care follow up call on 02/02/17.     Assessment: Received [facility] Transition of care referral on 08/10/17.  Transition of care follow up pending patient contact.      Plan: RNCM will send unsuccessful outreach  letter, [facility] pamphlet, will call patient for 3rd telephone outreach attempt, transition of care follow up, and proceed with case closure, within 10 business days if no return call.     Joseph [name] H. Annia Friendly, BSN, [facility] Telephonic CM Phone: [phone] Fax: [phone]

## 2017-08-14 DIAGNOSIS — N186 End stage renal disease: Secondary | ICD-10-CM | POA: Diagnosis not present

## 2017-08-14 DIAGNOSIS — Z992 Dependence on renal dialysis: Secondary | ICD-10-CM | POA: Diagnosis not present

## 2017-08-15 ENCOUNTER — Inpatient Hospital Stay: Payer: Medicare Other | Admitting: Family Medicine

## 2017-08-15 DIAGNOSIS — Z992 Dependence on renal dialysis: Secondary | ICD-10-CM | POA: Diagnosis not present

## 2017-08-15 DIAGNOSIS — N186 End stage renal disease: Secondary | ICD-10-CM | POA: Diagnosis not present

## 2017-08-16 DIAGNOSIS — N186 End stage renal disease: Secondary | ICD-10-CM | POA: Diagnosis not present

## 2017-08-16 DIAGNOSIS — Z992 Dependence on renal dialysis: Secondary | ICD-10-CM | POA: Diagnosis not present

## 2017-08-17 DIAGNOSIS — Z992 Dependence on renal dialysis: Secondary | ICD-10-CM | POA: Diagnosis not present

## 2017-08-17 DIAGNOSIS — N186 End stage renal disease: Secondary | ICD-10-CM | POA: Diagnosis not present

## 2017-08-18 DIAGNOSIS — Z992 Dependence on renal dialysis: Secondary | ICD-10-CM | POA: Diagnosis not present

## 2017-08-18 DIAGNOSIS — N186 End stage renal disease: Secondary | ICD-10-CM | POA: Diagnosis not present

## 2017-08-19 DIAGNOSIS — Z992 Dependence on renal dialysis: Secondary | ICD-10-CM | POA: Diagnosis not present

## 2017-08-19 DIAGNOSIS — N186 End stage renal disease: Secondary | ICD-10-CM | POA: Diagnosis not present

## 2017-08-20 ENCOUNTER — Other Ambulatory Visit: Payer: Self-pay | Admitting: Family Medicine

## 2017-08-20 DIAGNOSIS — Z992 Dependence on renal dialysis: Secondary | ICD-10-CM | POA: Diagnosis not present

## 2017-08-20 DIAGNOSIS — N186 End stage renal disease: Secondary | ICD-10-CM | POA: Diagnosis not present

## 2017-08-21 DIAGNOSIS — Z961 Presence of intraocular lens: Secondary | ICD-10-CM | POA: Diagnosis not present

## 2017-08-21 DIAGNOSIS — Z992 Dependence on renal dialysis: Secondary | ICD-10-CM | POA: Diagnosis not present

## 2017-08-21 DIAGNOSIS — N186 End stage renal disease: Secondary | ICD-10-CM | POA: Diagnosis not present

## 2017-08-21 DIAGNOSIS — H04123 Dry eye syndrome of bilateral lacrimal glands: Secondary | ICD-10-CM | POA: Diagnosis not present

## 2017-08-21 DIAGNOSIS — H3581 Retinal edema: Secondary | ICD-10-CM | POA: Diagnosis not present

## 2017-08-21 DIAGNOSIS — H35371 Puckering of macula, right eye: Secondary | ICD-10-CM | POA: Diagnosis not present

## 2017-08-21 DIAGNOSIS — Z79899 Other long term (current) drug therapy: Secondary | ICD-10-CM | POA: Diagnosis not present

## 2017-08-21 DIAGNOSIS — H353231 Exudative age-related macular degeneration, bilateral, with active choroidal neovascularization: Secondary | ICD-10-CM | POA: Diagnosis not present

## 2017-08-21 DIAGNOSIS — H35713 Central serous chorioretinopathy, bilateral: Secondary | ICD-10-CM | POA: Diagnosis not present

## 2017-08-22 DIAGNOSIS — I132 Hypertensive heart and chronic kidney disease with heart failure and with stage 5 chronic kidney disease, or end stage renal disease: Secondary | ICD-10-CM | POA: Diagnosis not present

## 2017-08-22 DIAGNOSIS — N281 Cyst of kidney, acquired: Secondary | ICD-10-CM | POA: Diagnosis not present

## 2017-08-22 DIAGNOSIS — N186 End stage renal disease: Secondary | ICD-10-CM | POA: Diagnosis not present

## 2017-08-22 DIAGNOSIS — Z992 Dependence on renal dialysis: Secondary | ICD-10-CM | POA: Diagnosis not present

## 2017-08-23 DIAGNOSIS — Z992 Dependence on renal dialysis: Secondary | ICD-10-CM | POA: Diagnosis not present

## 2017-08-23 DIAGNOSIS — N186 End stage renal disease: Secondary | ICD-10-CM | POA: Diagnosis not present

## 2017-08-24 ENCOUNTER — Encounter: Payer: Self-pay | Admitting: *Deleted

## 2017-08-24 ENCOUNTER — Other Ambulatory Visit: Payer: Self-pay | Admitting: *Deleted

## 2017-08-24 DIAGNOSIS — N186 End stage renal disease: Secondary | ICD-10-CM | POA: Diagnosis not present

## 2017-08-24 DIAGNOSIS — Z992 Dependence on renal dialysis: Secondary | ICD-10-CM | POA: Diagnosis not present

## 2017-08-24 NOTE — Patient Outreach (Signed)
Covering for [employer] telephonic [facility] RNCM Third unsuccessful outreach to complete transition of care call. Patient was hospitalized at [facility] from 2/11-2/14/2019 with chest pain likely secondary to referred pain from tight adrenal gland hemorrhage. He was discharged home on 2/14 and did not require home health services.  Left message on home number with request to return call. Will mail unsuccessful outreach letter with [facility] CM information brochure to patient's home if no return call from patient by end of business day today.  Barrington Ellison RN,CCM,CDE [facility] Management Coordinator Office Phone [phone] Office Fax [phone]

## 2017-08-25 DIAGNOSIS — Z992 Dependence on renal dialysis: Secondary | ICD-10-CM | POA: Diagnosis not present

## 2017-08-25 DIAGNOSIS — N186 End stage renal disease: Secondary | ICD-10-CM | POA: Diagnosis not present

## 2017-08-26 DIAGNOSIS — N186 End stage renal disease: Secondary | ICD-10-CM | POA: Diagnosis not present

## 2017-08-26 DIAGNOSIS — Z992 Dependence on renal dialysis: Secondary | ICD-10-CM | POA: Diagnosis not present

## 2017-08-27 ENCOUNTER — Ambulatory Visit: Payer: Self-pay | Admitting: *Deleted

## 2017-08-27 DIAGNOSIS — N186 End stage renal disease: Secondary | ICD-10-CM | POA: Diagnosis not present

## 2017-08-27 DIAGNOSIS — Z992 Dependence on renal dialysis: Secondary | ICD-10-CM | POA: Diagnosis not present

## 2017-08-28 DIAGNOSIS — Z992 Dependence on renal dialysis: Secondary | ICD-10-CM | POA: Diagnosis not present

## 2017-08-28 DIAGNOSIS — N186 End stage renal disease: Secondary | ICD-10-CM | POA: Diagnosis not present

## 2017-08-29 DIAGNOSIS — Z992 Dependence on renal dialysis: Secondary | ICD-10-CM | POA: Diagnosis not present

## 2017-08-29 DIAGNOSIS — N186 End stage renal disease: Secondary | ICD-10-CM | POA: Diagnosis not present

## 2017-08-30 DIAGNOSIS — Z992 Dependence on renal dialysis: Secondary | ICD-10-CM | POA: Diagnosis not present

## 2017-08-30 DIAGNOSIS — N186 End stage renal disease: Secondary | ICD-10-CM | POA: Diagnosis not present

## 2017-08-31 DIAGNOSIS — Z992 Dependence on renal dialysis: Secondary | ICD-10-CM | POA: Diagnosis not present

## 2017-08-31 DIAGNOSIS — N186 End stage renal disease: Secondary | ICD-10-CM | POA: Diagnosis not present

## 2017-09-01 DIAGNOSIS — N186 End stage renal disease: Secondary | ICD-10-CM | POA: Diagnosis not present

## 2017-09-01 DIAGNOSIS — Z992 Dependence on renal dialysis: Secondary | ICD-10-CM | POA: Diagnosis not present

## 2017-09-02 DIAGNOSIS — Z992 Dependence on renal dialysis: Secondary | ICD-10-CM | POA: Diagnosis not present

## 2017-09-02 DIAGNOSIS — N186 End stage renal disease: Secondary | ICD-10-CM | POA: Diagnosis not present

## 2017-09-03 DIAGNOSIS — Z992 Dependence on renal dialysis: Secondary | ICD-10-CM | POA: Diagnosis not present

## 2017-09-03 DIAGNOSIS — N186 End stage renal disease: Secondary | ICD-10-CM | POA: Diagnosis not present

## 2017-09-04 ENCOUNTER — Other Ambulatory Visit: Payer: Self-pay

## 2017-09-04 ENCOUNTER — Encounter: Payer: Self-pay | Admitting: Family Medicine

## 2017-09-04 ENCOUNTER — Ambulatory Visit (INDEPENDENT_AMBULATORY_CARE_PROVIDER_SITE_OTHER): Payer: 59 | Admitting: Family Medicine

## 2017-09-04 VITALS — BP 136/72 | HR 72 | Temp 98.1°F | Resp 16 | Ht 71.0 in | Wt 193.0 lb

## 2017-09-04 DIAGNOSIS — N186 End stage renal disease: Secondary | ICD-10-CM

## 2017-09-04 DIAGNOSIS — R05 Cough: Secondary | ICD-10-CM | POA: Diagnosis not present

## 2017-09-04 DIAGNOSIS — D631 Anemia in chronic kidney disease: Secondary | ICD-10-CM | POA: Diagnosis not present

## 2017-09-04 DIAGNOSIS — E7849 Other hyperlipidemia: Secondary | ICD-10-CM

## 2017-09-04 DIAGNOSIS — Z992 Dependence on renal dialysis: Secondary | ICD-10-CM

## 2017-09-04 DIAGNOSIS — I10 Essential (primary) hypertension: Secondary | ICD-10-CM | POA: Diagnosis not present

## 2017-09-04 DIAGNOSIS — M549 Dorsalgia, unspecified: Secondary | ICD-10-CM | POA: Diagnosis not present

## 2017-09-04 DIAGNOSIS — R059 Cough, unspecified: Secondary | ICD-10-CM

## 2017-09-04 MED ORDER — ROSUVASTATIN CALCIUM 10 MG PO TABS
10.0000 mg | ORAL_TABLET | Freq: Every day | ORAL | 3 refills | Status: DC
Start: 2017-09-04 — End: 2017-09-07

## 2017-09-04 MED ORDER — PANTOPRAZOLE SODIUM 40 MG PO TBEC: 40.0000 mg | DELAYED_RELEASE_TABLET | Freq: Every day | ORAL | 2 refills | Status: DC

## 2017-09-04 NOTE — Progress Notes (Signed)
   Subjective:    Patient ID: Joseph [name], male    DOB: [birth year], 52 y.o.   MRN: [id number]  Patient presents for Follow-up (is fasting) and Cough (x1 month- semi-productive cough with intermittent low grade temp)   Pt here for follow up ,seen 1 month ago, after hospital admittion for spontaneous adrenal gland hemorrhage.  Hb has been monitored and has been stable, plan is watchful waiting for resolution at this time  He is also awaiting tranplant for his amyloidosis/ESRD  Will see cardiology and have a stress test   He has had cough x 1 month, with mild production. Does have history of PNA. Used albuterol a few weeks ago.no longer using cough meds, no SOB  Due for repeat Hb for his anemia   Eye doctor has stopped the methotrexate , has appt with [facility] . Eylea is on hold as well   Had elevated Diastolic so Epogen not given    Taking crestor  For lipids        Review Of Systems:  GEN- denies fatigue, fever, weight loss,weakness, recent illness HEENT- denies eye drainage, change in vision, nasal discharge, CVS- denies chest pain, palpitations RESP- denies SOB, +cough, wheeze ABD- denies N/V, change in stools, abd pain GU- denies dysuria, hematuria, dribbling, incontinence MSK- denies joint pain, muscle aches, injury Neuro- denies headache, dizziness, syncope, seizure activity       Objective:    BP 136/72   Pulse 72   Temp 98.1 F (36.7 C) (Oral)   Resp 16   Ht 5\' 11"  (1.803 m)   Wt 193 lb (87.5 kg)   SpO2 98%   BMI 26.92 kg/m  GEN- NAD, alert and oriented x3,looks much better today HEENT- PERRL, EOMI, non injected sclera, pink conjunctiva, MMM, oropharynx clear Neck- Supple, no LAD CVS- RRR, no murmur RESP-CTAB, no wheeze, normal WOB ABD-NABS,soft,NT,ND,periotoneal cath in tact EXT- No edema Pulses- Radial 2+        Assessment & Plan:      Problem List Items Addressed This Visit      Unprioritized   Hyperlipidemia - Primary    Check LFT and lipids on statin      Relevant Medications   rosuvastatin (CRESTOR) 10 MG tablet   Other Relevant Orders   Lipid panel (Completed)   Essential hypertension    controlled      Relevant Medications   rosuvastatin (CRESTOR) 10 MG tablet   Other Relevant Orders   Lipid panel (Completed)   Hepatic Function Panel (Completed)   ESRD on peritoneal dialysis [facility])    Per neprhology awaiting transplant list He did discuss that urology told him gallstones have to be removed before transplant Advised him to call his transplant coordinators and confirm this As he has peritoneal Dialysis, prefer not to send for any abdominal surgery unless absolutely necessary, also no pain from the gallstones currently       Other Visit Diagnoses    Anemia due to chronic kidney disease, on chronic dialysis ([place])       Hb is stable up to 9.8 on his mychart from dialyssis , they are checking weekly now   Cough       No sign of PNA currently, looks well, no cough meds needed as improved      Note: This dictation was prepared with Dragon dictation along with smaller phrase technology. Any transcriptional errors that result from this process are unintentional.

## 2017-09-04 NOTE — Patient Instructions (Signed)
F/U 4 months  

## 2017-09-05 ENCOUNTER — Encounter: Payer: Self-pay | Admitting: Family Medicine

## 2017-09-05 DIAGNOSIS — N186 End stage renal disease: Secondary | ICD-10-CM | POA: Diagnosis not present

## 2017-09-05 DIAGNOSIS — Z992 Dependence on renal dialysis: Secondary | ICD-10-CM | POA: Diagnosis not present

## 2017-09-05 LAB — LIPID PANEL
CHOLESTEROL: 161 mg/dL (ref <200)
HDL: 29 mg/dL — AB (ref >40)
LDL Cholesterol (Calc): 102 mg/dL (calc) — ABNORMAL HIGH
Non-HDL Cholesterol (Calc): 132 mg/dL (calc) — ABNORMAL HIGH (ref <130)
Total CHOL/HDL Ratio: 5.6 (calc) — ABNORMAL HIGH (ref <5.0)
Triglycerides: 179 mg/dL — ABNORMAL HIGH (ref <150)

## 2017-09-05 LAB — HEPATIC FUNCTION PANEL
AG Ratio: 1.2 (calc) (ref 1.0–2.5)
ALBUMIN MSPROF: 2.9 g/dL — AB (ref 3.6–5.1)
ALT: 29 U/L (ref 9–46)
AST: 25 U/L (ref 10–35)
Alkaline phosphatase (APISO): 164 U/L — ABNORMAL HIGH (ref 40–115)
BILIRUBIN TOTAL: 0.4 mg/dL (ref 0.2–1.2)
Bilirubin, Direct: 0.1 mg/dL (ref 0.0–0.2)
GLOBULIN: 2.5 g/dL (ref 1.9–3.7)
Indirect Bilirubin: 0.3 mg/dL (calc) (ref 0.2–1.2)
Total Protein: 5.4 g/dL — ABNORMAL LOW (ref 6.1–8.1)

## 2017-09-05 NOTE — Assessment & Plan Note (Signed)
controlled 

## 2017-09-05 NOTE — Assessment & Plan Note (Signed)
Per neprhology awaiting transplant list He did discuss that urology told him gallstones have to be removed before transplant Advised him to call his transplant coordinators and confirm this As he has peritoneal Dialysis, prefer not to send for any abdominal surgery unless absolutely necessary, also no pain from the gallstones currently

## 2017-09-05 NOTE — Assessment & Plan Note (Signed)
Check LFT and lipids on statin

## 2017-09-06 DIAGNOSIS — Z992 Dependence on renal dialysis: Secondary | ICD-10-CM | POA: Diagnosis not present

## 2017-09-06 DIAGNOSIS — N186 End stage renal disease: Secondary | ICD-10-CM | POA: Diagnosis not present

## 2017-09-07 ENCOUNTER — Other Ambulatory Visit: Payer: Self-pay | Admitting: *Deleted

## 2017-09-07 ENCOUNTER — Encounter: Payer: Self-pay | Admitting: *Deleted

## 2017-09-07 ENCOUNTER — Encounter: Payer: Self-pay | Admitting: Cardiovascular Disease

## 2017-09-07 DIAGNOSIS — N186 End stage renal disease: Secondary | ICD-10-CM | POA: Diagnosis not present

## 2017-09-07 DIAGNOSIS — Z992 Dependence on renal dialysis: Secondary | ICD-10-CM | POA: Diagnosis not present

## 2017-09-08 DIAGNOSIS — N186 End stage renal disease: Secondary | ICD-10-CM | POA: Diagnosis not present

## 2017-09-08 DIAGNOSIS — Z992 Dependence on renal dialysis: Secondary | ICD-10-CM | POA: Diagnosis not present

## 2017-09-08 NOTE — Progress Notes (Signed)
Cardiology Office Note  Date:  09/11/2017   ID:  [name], DOB [birth year], MRN [id number]  PCP:  Joseph Rossetti, MD   Chief Complaint  Patient presents with  . Other    12 month follow up. Patient would like a stress test. Meds reviewed verbally with patient.     HPI:  Joseph [name] is a 52 y.o. male with hx of  ESRD secondary to amyloidosis on peritoneal dialysis, On renal transplant list at [place] HTN Hyperlipidemia Stopped smoking 2009, smoked 25 yrs Who presents for follow-up of his coronary risk factors, need for stress testing to stay on transplant list  In the hospital 08/04/2017 with chest pain Hospital records reviewed with the patient in detail referred pain from his right adrenal gland hemorrhage-spontaneous hemorrhage referred pain to back and right shoulder  CT scan performed showing gallstones Also has some mild coronary calcifications no carotid or aortic atherosclerosis  Since last visit, pt has been doing well. Denies CP, SOB.  Relatively good activity level, exercise tolerance He does peritoneal dialysis every day for 9 hours a day.  EKG personally reviewed by myself on todays visit Shows normal sinus rhythm rate 67 bpm no significant ST or T wave changes   PMH:   has a past medical history of Amyloidosis ([place]), Blood clot in abdominal vein, Blood dyscrasia, Central serous chorioretinopathy, Dry eye, GERD (gastroesophageal reflux disease), Heart murmur, Hyperlipidemia, Hypertension, Pneumonia, and Renal insufficiency.  PSH:    Past Surgical History:  Procedure Laterality Date  . AORTOGRAM Bilateral 04/11/2015   Procedure: Aortogram;  Surgeon: Katha Cabal, MD;  Location: [place] [facility];  Service: Cardiovascular;  Laterality: Bilateral;  . CAPD INSERTION N/A 07/09/2015   Procedure: LAPAROSCOPIC INSERTION CONTINUOUS AMBULATORY PERITONEAL DIALYSIS  (CAPD) CATHETER;  Surgeon: Katha Cabal, MD;  Location: [facility] ORS;  Service: Vascular;   Laterality: N/A;  . COLONOSCOPY WITH PROPOFOL N/A 01/11/2016   Procedure: COLONOSCOPY WITH PROPOFOL;  Surgeon: [name], MD;  Location: [facility] ENDOSCOPY;  Service: Endoscopy;  Laterality: N/A;  . EXPLORATORY LAPAROTOMY     done to find and remove abdominal blood clot as a teenager  . IMPLANTATION / PLACEMENT PERMANENT EPIDURAL CATHETER Right 2016  . PERIPHERAL VASCULAR CATHETERIZATION Left 04/11/2015   Procedure: Embolization;  Surgeon: Katha Cabal, MD;  Location: [place] [facility];  Service: Cardiovascular;  Laterality: Left;  . PERIPHERAL VASCULAR CATHETERIZATION N/A 04/16/2015   Procedure: Dialysis/Perma Catheter Insertion;  Surgeon: Katha Cabal, MD;  Location: [place] [facility];  Service: Cardiovascular;  Laterality: N/A;  . PERIPHERAL VASCULAR CATHETERIZATION N/A 08/24/2015   Procedure: Dialysis/Perma Catheter Removal;  Surgeon: Katha Cabal, MD;  Location: [place] [facility];  Service: Cardiovascular;  Laterality: N/A;  . RENAL BIOPSY, PERCUTANEOUS  04/06/2015      . TONSILLECTOMY      Current Outpatient Medications  Medication Sig Dispense Refill  . Aflibercept (EYLEA) 2 MG/0.05ML SOLN 1 Dose by Intravitreal route every 30 (thirty) days.    Marland Kitchen albuterol (PROVENTIL HFA;VENTOLIN HFA) 108 (90 Base) MCG/ACT inhaler Inhale 2 puffs into the lungs every 6 (six) hours as needed for wheezing or shortness of breath. 1 Inhaler 2  . atorvastatin (LIPITOR) 20 MG tablet TAKE 1 TABLET BY MOUTH ONCE DAILY 90 tablet 3  . cycloSPORINE (RESTASIS) 0.05 % ophthalmic emulsion Place 1 drop into both eyes daily.     Marland Kitchen Epoetin Alfa (EPOGEN IJ) Inject as directed every 30 (thirty) days. Administered at Dialysis center.    Marland Kitchen  folic acid (FOLVITE) 1 MG tablet Take 5 mg by mouth daily.    Marland Kitchen gentamicin cream (GARAMYCIN) 0.1 % Apply 1 application topically at bedtime.   0  . HYDROmorphone (DILAUDID) 2 MG tablet Take 1 tablet (2 mg total) by mouth every 6 (six) hours as needed for severe pain.  30 tablet 0  . Hypromellose (ARTIFICIAL TEARS OP) Apply 1 drop to eye as needed.    Marland Kitchen losartan (COZAAR) 100 MG tablet Take 1 tablet (100 mg total) by mouth daily. (Patient taking differently: Take 50 mg by mouth daily. ) 90 tablet 3  . ondansetron (ZOFRAN-ODT) 4 MG disintegrating tablet Take 1 tablet (4 mg total) by mouth every 8 (eight) hours as needed for nausea or vomiting. 30 tablet 0  . pantoprazole (PROTONIX) 40 MG tablet Take 1 tablet (40 mg total) by mouth daily. 90 tablet 2  . polyethylene glycol (MIRALAX / GLYCOLAX) packet Take 17 g by mouth daily. 14 each 0  . VELTASSA 8.4 g packet Take 1 packet by mouth daily.  3  . ezetimibe (ZETIA) 10 MG tablet Take 1 tablet (10 mg total) by mouth daily. 90 tablet 3   No current facility-administered medications for this visit.      Allergies:   Ciprofloxacin hcl; Doxycycline hyclate; and Phenol-glycerin   Social History:  The patient  reports that he quit smoking about 10 years ago. His smoking use included cigarettes. He has a 10.00 pack-year smoking history. he has never used smokeless tobacco. He reports that he does not drink alcohol or use drugs.   Family History:   family history includes Birth defects in his sister; Early death in his maternal uncle; Heart attack in his maternal uncle and maternal uncle; Heart disease in his maternal uncle and maternal uncle; Hypertension in his mother and other; Multiple sclerosis in his sister.    Review of Systems: Review of Systems  Constitutional: Negative.   Respiratory: Negative.   Cardiovascular: Negative.   Gastrointestinal: Negative.   Musculoskeletal: Negative.   Neurological: Negative.   Psychiatric/Behavioral: Negative.   All other systems reviewed and are negative.    PHYSICAL EXAM: VS:  BP 130/88 (BP Location: Left Arm, Patient Position: Sitting, Cuff Size: Normal)   Pulse 67   Ht 5\' 11"  (1.803 m)   Wt 191 lb (86.6 kg)   BMI 26.64 kg/m  , BMI Body mass index is 26.64  kg/m. GEN: Well nourished, well developed, in no acute distress  HEENT: normal  Neck: no JVD, carotid bruits, or masses Cardiac: RRR; no murmurs, rubs, or gallops,no edema  Respiratory:  clear to auscultation bilaterally, normal work of breathing GI: soft, nontender, nondistended, + BS MS: no deformity or atrophy  Skin: warm and dry, no rash Neuro:  Strength and sensation are intact Psych: euthymic mood, full affect Peritoneal catheter noted  Recent Labs: 08/06/2017: B Natriuretic Peptide 66.0; TSH 1.796 08/09/2017: BUN 50; Creatinine, Ser 5.02; Potassium 3.5; Sodium 141 08/10/2017: Hemoglobin 9.2; Platelets 281 09/04/2017: ALT 29    Lipid Panel Lab Results  Component Value Date   CHOL 161 09/04/2017   HDL 29 (L) 09/04/2017   LDLCALC 102 (H) 09/04/2017   TRIG 179 (H) 09/04/2017      Wt Readings from Last 3 Encounters:  09/11/17 191 lb (86.6 kg)  09/04/17 193 lb (87.5 kg)  08/10/17 202 lb (91.6 kg)       ASSESSMENT AND PLAN:  ESRD on peritoneal dialysis ([facility]) -  Stress test ordered, preop cardiovascular  evaluation for kidney transplant We will try treadmill Myoview  Chronic obstructive pulmonary disease, unspecified COPD type ([place]) - Prior history of smoking, Other chest pain  Essential hypertension Blood pressure is well controlled on today's visit. No changes made to the medications.  Mixed hyperlipidemia Cholesterol is elevated Goal LDL less than 70 given coronary calcifications Recommend he start Zetia 10 mg daily Stay on Lipitor  Renal amyloidosis ([place])  On the renal transplant list Dialysis 3 days a week  Coronary calcification seen on CT scan Images pulled up in the office today and reviewed with him Mild coronary calcification seen in the proximal to mid LAD No carotid or aortic atherosclerosis No significant calcification noted in the left circumflex or RCA   Disposition:   F/U  6 months   Total encounter time more than 45 minutes  Greater  than 50% was spent in counseling and coordination of care with the patient    Orders Placed This Encounter  Procedures  . NM Myocar Multi W/Spect W/Wall Motion / EF  . EKG 12-Lead     Signed, Esmond Plants, M.D., Ph.D. 09/11/2017  [place], [place]

## 2017-09-09 DIAGNOSIS — Z992 Dependence on renal dialysis: Secondary | ICD-10-CM | POA: Diagnosis not present

## 2017-09-09 DIAGNOSIS — N186 End stage renal disease: Secondary | ICD-10-CM | POA: Diagnosis not present

## 2017-09-10 DIAGNOSIS — Z760 Encounter for issue of repeat prescription: Secondary | ICD-10-CM | POA: Diagnosis not present

## 2017-09-10 DIAGNOSIS — N186 End stage renal disease: Secondary | ICD-10-CM | POA: Diagnosis not present

## 2017-09-10 DIAGNOSIS — Z992 Dependence on renal dialysis: Secondary | ICD-10-CM | POA: Diagnosis not present

## 2017-09-11 ENCOUNTER — Encounter: Payer: Self-pay | Admitting: Cardiovascular Disease

## 2017-09-11 ENCOUNTER — Ambulatory Visit (INDEPENDENT_AMBULATORY_CARE_PROVIDER_SITE_OTHER): Payer: 59 | Admitting: Cardiovascular Disease

## 2017-09-11 VITALS — BP 130/88 | HR 67 | Ht 71.0 in | Wt 191.0 lb

## 2017-09-11 DIAGNOSIS — J449 Chronic obstructive pulmonary disease, unspecified: Secondary | ICD-10-CM | POA: Diagnosis not present

## 2017-09-11 DIAGNOSIS — Z992 Dependence on renal dialysis: Secondary | ICD-10-CM

## 2017-09-11 DIAGNOSIS — I10 Essential (primary) hypertension: Secondary | ICD-10-CM | POA: Diagnosis not present

## 2017-09-11 DIAGNOSIS — E782 Mixed hyperlipidemia: Secondary | ICD-10-CM

## 2017-09-11 DIAGNOSIS — N08 Glomerular disorders in diseases classified elsewhere: Secondary | ICD-10-CM

## 2017-09-11 DIAGNOSIS — R0789 Other chest pain: Secondary | ICD-10-CM

## 2017-09-11 DIAGNOSIS — E854 Organ-limited amyloidosis: Secondary | ICD-10-CM

## 2017-09-11 DIAGNOSIS — N186 End stage renal disease: Secondary | ICD-10-CM

## 2017-09-11 DIAGNOSIS — I2510 Atherosclerotic heart disease of native coronary artery without angina pectoris: Secondary | ICD-10-CM | POA: Diagnosis not present

## 2017-09-11 MED ORDER — EZETIMIBE 10 MG PO TABS: 10.0000 mg | ORAL_TABLET | Freq: Every day | ORAL | 3 refills | Status: DC

## 2017-09-11 NOTE — Patient Instructions (Addendum)
Medication Instructions:   Goal LDL <70 Yours is 100  Start zetia one a day for cholesterol Stay on the lipitor  Labwork:  No new labs needed  Testing/Procedures:  We will order a treadmill myoview for kidney transplant list, CAD [place]  Your caregiver has ordered a Stress Test with nuclear imaging. The purpose of this test is to evaluate the blood supply to your heart muscle. This procedure is referred to as a "Non-Invasive Stress Test." This is because other than having an IV started in your vein, nothing is inserted or "invades" your body. Cardiac stress tests are done to find areas of poor blood flow to the heart by determining the extent of coronary artery disease (CAD). Some patients exercise on a treadmill, which naturally increases the blood flow to your heart, while others who are  unable to walk on a treadmill due to physical limitations have a pharmacologic/chemical stress agent called Lexiscan . This medicine will mimic walking on a treadmill by temporarily increasing your coronary blood flow.   Please note: these test may take anywhere between 2-4 hours to complete  PLEASE REPORT TO [employer] AT THE FIRST DESK WILL DIRECT YOU WHERE TO GO  Date of Procedure:_____________________________________  Arrival Time for Procedure:______________________________   PLEASE NOTIFY THE OFFICE AT LEAST 24 HOURS IN ADVANCE IF YOU ARE UNABLE TO KEEP YOUR APPOINTMENT.  [phone] AND  PLEASE NOTIFY NUCLEAR MEDICINE AT [facility] AT LEAST 24 HOURS IN ADVANCE IF YOU ARE UNABLE TO KEEP YOUR APPOINTMENT. [phone]  How to prepare for your Myoview test:  1. Do not eat or drink after midnight 2. No caffeine for 24 hours prior to test 3. No smoking 24 hours prior to test. 4. Your medication may be taken with water.  If your doctor stopped a medication because of this test, do not take that medication. 5. Ladies, please do not wear dresses.  Skirts or  pants are appropriate. Please wear a short sleeve shirt. 6. No perfume, cologne or lotion. 7. Wear comfortable walking shoes. No heels!    Follow-Up: It was a pleasure seeing you in the office today. Please call us if you have new issues that need to be addressed before your next appt.  [phone]  Your physician wants you to follow-up in: 12 months.  You will receive a reminder letter in the mail two months in advance. If you don't receive a letter, please call our office to schedule the follow-up appointment.  If you need a refill on your cardiac medications before your next appointment, please call your pharmacy.  For educational health videos Log in to : [email or web address] Or : [email or web address], password : [email or web address]

## 2017-09-12 DIAGNOSIS — Z992 Dependence on renal dialysis: Secondary | ICD-10-CM | POA: Diagnosis not present

## 2017-09-12 DIAGNOSIS — N186 End stage renal disease: Secondary | ICD-10-CM | POA: Diagnosis not present

## 2017-09-13 DIAGNOSIS — Z992 Dependence on renal dialysis: Secondary | ICD-10-CM | POA: Diagnosis not present

## 2017-09-13 DIAGNOSIS — N186 End stage renal disease: Secondary | ICD-10-CM | POA: Diagnosis not present

## 2017-09-14 DIAGNOSIS — H26492 Other secondary cataract, left eye: Secondary | ICD-10-CM | POA: Diagnosis not present

## 2017-09-14 DIAGNOSIS — Z992 Dependence on renal dialysis: Secondary | ICD-10-CM | POA: Diagnosis not present

## 2017-09-14 DIAGNOSIS — N186 End stage renal disease: Secondary | ICD-10-CM | POA: Diagnosis not present

## 2017-09-15 DIAGNOSIS — Z992 Dependence on renal dialysis: Secondary | ICD-10-CM | POA: Diagnosis not present

## 2017-09-15 DIAGNOSIS — N186 End stage renal disease: Secondary | ICD-10-CM | POA: Diagnosis not present

## 2017-09-16 DIAGNOSIS — Z992 Dependence on renal dialysis: Secondary | ICD-10-CM | POA: Diagnosis not present

## 2017-09-16 DIAGNOSIS — N186 End stage renal disease: Secondary | ICD-10-CM | POA: Diagnosis not present

## 2017-09-17 DIAGNOSIS — N186 End stage renal disease: Secondary | ICD-10-CM | POA: Diagnosis not present

## 2017-09-17 DIAGNOSIS — Z992 Dependence on renal dialysis: Secondary | ICD-10-CM | POA: Diagnosis not present

## 2017-09-18 ENCOUNTER — Other Ambulatory Visit: Payer: Self-pay | Admitting: Nephrology

## 2017-09-18 DIAGNOSIS — Z992 Dependence on renal dialysis: Secondary | ICD-10-CM | POA: Diagnosis not present

## 2017-09-18 DIAGNOSIS — N186 End stage renal disease: Secondary | ICD-10-CM | POA: Diagnosis not present

## 2017-09-18 DIAGNOSIS — Z01810 Encounter for preprocedural cardiovascular examination: Secondary | ICD-10-CM

## 2017-09-19 ENCOUNTER — Ambulatory Visit
Admission: RE | Admit: 2017-09-19 | Discharge: 2017-09-19 | Disposition: A | Discharge status: Home / Self Care | Payer: 59 | Source: Ambulatory Visit | Attending: Nephrology | Admitting: Nephrology

## 2017-09-19 ENCOUNTER — Encounter
Admission: RE | Admit: 2017-09-19 | Discharge: 2017-09-19 | Disposition: A | Discharge status: Home / Self Care | Payer: 59 | Source: Ambulatory Visit | Attending: Cardiovascular Disease | Admitting: Cardiovascular Disease

## 2017-09-19 ENCOUNTER — Telehealth: Payer: Self-pay | Admitting: Cardiovascular Disease

## 2017-09-19 DIAGNOSIS — N289 Disorder of kidney and ureter, unspecified: Secondary | ICD-10-CM | POA: Insufficient documentation

## 2017-09-19 DIAGNOSIS — Z01810 Encounter for preprocedural cardiovascular examination: Secondary | ICD-10-CM | POA: Diagnosis not present

## 2017-09-19 DIAGNOSIS — E854 Organ-limited amyloidosis: Secondary | ICD-10-CM | POA: Insufficient documentation

## 2017-09-19 DIAGNOSIS — E785 Hyperlipidemia, unspecified: Secondary | ICD-10-CM | POA: Diagnosis not present

## 2017-09-19 DIAGNOSIS — I10 Essential (primary) hypertension: Secondary | ICD-10-CM | POA: Diagnosis not present

## 2017-09-19 DIAGNOSIS — J449 Chronic obstructive pulmonary disease, unspecified: Secondary | ICD-10-CM | POA: Insufficient documentation

## 2017-09-19 DIAGNOSIS — Z992 Dependence on renal dialysis: Secondary | ICD-10-CM | POA: Insufficient documentation

## 2017-09-19 DIAGNOSIS — N186 End stage renal disease: Secondary | ICD-10-CM | POA: Insufficient documentation

## 2017-09-19 DIAGNOSIS — N08 Glomerular disorders in diseases classified elsewhere: Secondary | ICD-10-CM | POA: Insufficient documentation

## 2017-09-19 NOTE — Telephone Encounter (Signed)
NM [facility] calling to let us know patient declined testing today due to payment issues   Per insurance he would be 100% responsible for cost of test because last nm study was 10-26-16 and that is less than a year.    Please advise

## 2017-09-19 NOTE — Telephone Encounter (Signed)
Spoke with patients wife per release form and due to him having test last year it needs to be one full year before they will be able to repeat testing. So they will need to reschedule after Nov 16, 2017. Instructed them to give [place] a call when he is ready to get this scheduled. She was appreciative for the call with no further questions at this time.

## 2017-09-19 NOTE — Progress Notes (Signed)
*  PRELIMINARY RESULTS* Echocardiogram 2D Echocardiogram has been performed.  Joseph [name] 09/19/2017, 10:33 AM

## 2017-09-20 DIAGNOSIS — H35713 Central serous chorioretinopathy, bilateral: Secondary | ICD-10-CM | POA: Diagnosis not present

## 2017-09-20 DIAGNOSIS — Z992 Dependence on renal dialysis: Secondary | ICD-10-CM | POA: Diagnosis not present

## 2017-09-20 DIAGNOSIS — H353231 Exudative age-related macular degeneration, bilateral, with active choroidal neovascularization: Secondary | ICD-10-CM | POA: Diagnosis not present

## 2017-09-20 DIAGNOSIS — H35723 Serous detachment of retinal pigment epithelium, bilateral: Secondary | ICD-10-CM | POA: Diagnosis not present

## 2017-09-20 DIAGNOSIS — H353221 Exudative age-related macular degeneration, left eye, with active choroidal neovascularization: Secondary | ICD-10-CM | POA: Diagnosis not present

## 2017-09-20 DIAGNOSIS — Z961 Presence of intraocular lens: Secondary | ICD-10-CM | POA: Diagnosis not present

## 2017-09-20 DIAGNOSIS — H35352 Cystoid macular degeneration, left eye: Secondary | ICD-10-CM | POA: Diagnosis not present

## 2017-09-20 DIAGNOSIS — N186 End stage renal disease: Secondary | ICD-10-CM | POA: Diagnosis not present

## 2017-09-21 DIAGNOSIS — Z992 Dependence on renal dialysis: Secondary | ICD-10-CM | POA: Diagnosis not present

## 2017-09-21 DIAGNOSIS — N186 End stage renal disease: Secondary | ICD-10-CM | POA: Diagnosis not present

## 2017-09-22 DIAGNOSIS — Z992 Dependence on renal dialysis: Secondary | ICD-10-CM | POA: Diagnosis not present

## 2017-09-22 DIAGNOSIS — N186 End stage renal disease: Secondary | ICD-10-CM | POA: Diagnosis not present

## 2017-09-23 ENCOUNTER — Emergency Department: Payer: 59

## 2017-09-23 ENCOUNTER — Inpatient Hospital Stay: Payer: 59

## 2017-09-23 ENCOUNTER — Inpatient Hospital Stay
Admission: EM | Admit: 2017-09-23 | Discharge: 2017-09-25 | DRG: 551 | Disposition: A | Discharge status: Home / Self Care | Payer: 59 | Attending: Family Medicine | Admitting: Family Medicine

## 2017-09-23 ENCOUNTER — Other Ambulatory Visit: Payer: Self-pay

## 2017-09-23 ENCOUNTER — Encounter: Payer: Self-pay | Admitting: Emergency Medicine

## 2017-09-23 DIAGNOSIS — Z881 Allergy status to other antibiotic agents status: Secondary | ICD-10-CM | POA: Diagnosis not present

## 2017-09-23 DIAGNOSIS — Z888 Allergy status to other drugs, medicaments and biological substances status: Secondary | ICD-10-CM | POA: Diagnosis not present

## 2017-09-23 DIAGNOSIS — E785 Hyperlipidemia, unspecified: Secondary | ICD-10-CM | POA: Diagnosis present

## 2017-09-23 DIAGNOSIS — Z992 Dependence on renal dialysis: Secondary | ICD-10-CM

## 2017-09-23 DIAGNOSIS — D631 Anemia in chronic kidney disease: Secondary | ICD-10-CM | POA: Diagnosis not present

## 2017-09-23 DIAGNOSIS — E2749 Other adrenocortical insufficiency: Secondary | ICD-10-CM | POA: Diagnosis not present

## 2017-09-23 DIAGNOSIS — R52 Pain, unspecified: Secondary | ICD-10-CM

## 2017-09-23 DIAGNOSIS — I120 Hypertensive chronic kidney disease with stage 5 chronic kidney disease or end stage renal disease: Secondary | ICD-10-CM | POA: Diagnosis not present

## 2017-09-23 DIAGNOSIS — I2510 Atherosclerotic heart disease of native coronary artery without angina pectoris: Secondary | ICD-10-CM | POA: Diagnosis present

## 2017-09-23 DIAGNOSIS — Z87891 Personal history of nicotine dependence: Secondary | ICD-10-CM | POA: Diagnosis not present

## 2017-09-23 DIAGNOSIS — K5900 Constipation, unspecified: Secondary | ICD-10-CM | POA: Diagnosis not present

## 2017-09-23 DIAGNOSIS — R161 Splenomegaly, not elsewhere classified: Secondary | ICD-10-CM | POA: Diagnosis not present

## 2017-09-23 DIAGNOSIS — I10 Essential (primary) hypertension: Secondary | ICD-10-CM | POA: Diagnosis not present

## 2017-09-23 DIAGNOSIS — K219 Gastro-esophageal reflux disease without esophagitis: Secondary | ICD-10-CM | POA: Diagnosis present

## 2017-09-23 DIAGNOSIS — M7989 Other specified soft tissue disorders: Secondary | ICD-10-CM | POA: Diagnosis not present

## 2017-09-23 DIAGNOSIS — Z79899 Other long term (current) drug therapy: Secondary | ICD-10-CM | POA: Diagnosis not present

## 2017-09-23 DIAGNOSIS — R079 Chest pain, unspecified: Secondary | ICD-10-CM | POA: Diagnosis not present

## 2017-09-23 DIAGNOSIS — E859 Amyloidosis, unspecified: Secondary | ICD-10-CM | POA: Diagnosis not present

## 2017-09-23 DIAGNOSIS — M549 Dorsalgia, unspecified: Secondary | ICD-10-CM | POA: Diagnosis not present

## 2017-09-23 DIAGNOSIS — R918 Other nonspecific abnormal finding of lung field: Secondary | ICD-10-CM | POA: Diagnosis not present

## 2017-09-23 DIAGNOSIS — N2581 Secondary hyperparathyroidism of renal origin: Secondary | ICD-10-CM | POA: Diagnosis present

## 2017-09-23 DIAGNOSIS — N186 End stage renal disease: Secondary | ICD-10-CM | POA: Diagnosis present

## 2017-09-23 DIAGNOSIS — M5124 Other intervertebral disc displacement, thoracic region: Secondary | ICD-10-CM | POA: Diagnosis not present

## 2017-09-23 DIAGNOSIS — M62830 Muscle spasm of back: Secondary | ICD-10-CM | POA: Diagnosis present

## 2017-09-23 DIAGNOSIS — K8020 Calculus of gallbladder without cholecystitis without obstruction: Secondary | ICD-10-CM | POA: Diagnosis not present

## 2017-09-23 DIAGNOSIS — R0602 Shortness of breath: Secondary | ICD-10-CM | POA: Diagnosis not present

## 2017-09-23 LAB — CBC WITH DIFFERENTIAL/PLATELET
BASOS ABS: 0.1 10*3/uL (ref 0–0.1)
BASOS PCT: 1 %
Eosinophils Absolute: 0.9 10*3/uL — ABNORMAL HIGH (ref 0–0.7)
Eosinophils Relative: 8 %
HEMATOCRIT: 38.7 % — AB (ref 40.0–52.0)
HEMOGLOBIN: 12.9 g/dL — AB (ref 13.0–18.0)
Lymphocytes Relative: 16 %
Lymphs Abs: 1.7 10*3/uL (ref 1.0–3.6)
MCH: 31.5 pg (ref 26.0–34.0)
MCHC: 33.3 g/dL (ref 32.0–36.0)
MCV: 94.7 fL (ref 80.0–100.0)
MONO ABS: 0.5 10*3/uL (ref 0.2–1.0)
Monocytes Relative: 5 %
NEUTROS ABS: 7.4 10*3/uL — AB (ref 1.4–6.5)
NEUTROS PCT: 70 %
Platelets: 268 10*3/uL (ref 150–440)
RBC: 4.09 MIL/uL — AB (ref 4.40–5.90)
RDW: 19.5 % — AB (ref 11.5–14.5)
WBC: 10.5 10*3/uL (ref 3.8–10.6)

## 2017-09-23 LAB — LACTIC ACID, PLASMA: Lactic Acid, Venous: 1.1 mmol/L (ref 0.5–1.9)

## 2017-09-23 LAB — COMPREHENSIVE METABOLIC PANEL
ALBUMIN: 2.9 g/dL — AB (ref 3.5–5.0)
ALT: 32 U/L (ref 17–63)
AST: 33 U/L (ref 15–41)
Alkaline Phosphatase: 164 U/L — ABNORMAL HIGH (ref 38–126)
Anion gap: 10 (ref 5–15)
BUN: 40 mg/dL — AB (ref 6–20)
CO2: 24 mmol/L (ref 22–32)
CREATININE: 4.37 mg/dL — AB (ref 0.61–1.24)
Calcium: 8.3 mg/dL — ABNORMAL LOW (ref 8.9–10.3)
Chloride: 107 mmol/L (ref 101–111)
GFR calc Af Amer: 16 mL/min — ABNORMAL LOW (ref >60)
GFR calc non Af Amer: 14 mL/min — ABNORMAL LOW (ref >60)
GLUCOSE: 139 mg/dL — AB (ref 65–99)
POTASSIUM: 4.2 mmol/L (ref 3.5–5.1)
Sodium: 141 mmol/L (ref 135–145)
TOTAL PROTEIN: 6.6 g/dL (ref 6.5–8.1)
Total Bilirubin: 0.5 mg/dL (ref 0.3–1.2)

## 2017-09-23 LAB — LIPASE, BLOOD: Lipase: 44 U/L (ref 11–51)

## 2017-09-23 LAB — TROPONIN I: Troponin I: <0.03 ng/mL (ref <0.03)

## 2017-09-23 MED ORDER — ONDANSETRON HCL 4 MG/2ML IJ SOLN
4.0000 mg | Freq: Once | INTRAMUSCULAR | Status: AC
  Administered 2017-09-23: 4 mg via INTRAVENOUS
  Filled 2017-09-23: qty 2

## 2017-09-23 MED ORDER — POLYVINYL ALCOHOL 1.4 % OP SOLN
1.0000 [drp] | OPHTHALMIC | Status: DC | PRN
  Filled 2017-09-23: qty 15

## 2017-09-23 MED ORDER — ACETAMINOPHEN 325 MG PO TABS: 650.0000 mg | ORAL_TABLET | Freq: Four times a day (QID) | ORAL | Status: DC | PRN

## 2017-09-23 MED ORDER — PATIROMER SORBITEX CALCIUM 8.4 G PO PACK
1.0000 | PACK | Freq: Every day | ORAL | Status: DC
  Administered 2017-09-24: 1 via ORAL
  Filled 2017-09-23 (×2): qty 4

## 2017-09-23 MED ORDER — EZETIMIBE 10 MG PO TABS
10.0000 mg | ORAL_TABLET | Freq: Every day | ORAL | Status: DC
  Administered 2017-09-24: 10 mg via ORAL
  Filled 2017-09-23 (×2): qty 1

## 2017-09-23 MED ORDER — ALBUTEROL SULFATE (2.5 MG/3ML) 0.083% IN NEBU: 2.5000 mg | INHALATION_SOLUTION | Freq: Four times a day (QID) | RESPIRATORY_TRACT | Status: DC | PRN

## 2017-09-23 MED ORDER — HYDRALAZINE HCL 20 MG/ML IJ SOLN
10.0000 mg | Freq: Four times a day (QID) | INTRAMUSCULAR | Status: DC | PRN
  Administered 2017-09-24: 10 mg via INTRAVENOUS
  Filled 2017-09-23: qty 1

## 2017-09-23 MED ORDER — CYCLOSPORINE 0.05 % OP EMUL
1.0000 [drp] | Freq: Every day | OPHTHALMIC | Status: DC
  Administered 2017-09-24: 1 [drp] via OPHTHALMIC
  Filled 2017-09-23 (×2): qty 1

## 2017-09-23 MED ORDER — LOSARTAN POTASSIUM 50 MG PO TABS
50.0000 mg | ORAL_TABLET | Freq: Every day | ORAL | Status: DC
  Administered 2017-09-24: 50 mg via ORAL
  Filled 2017-09-23: qty 1

## 2017-09-23 MED ORDER — HYDROMORPHONE HCL 1 MG/ML IJ SOLN: 0.5000 mg | INTRAMUSCULAR | Status: DC | PRN

## 2017-09-23 MED ORDER — FOLIC ACID 1 MG PO TABS
5.0000 mg | ORAL_TABLET | Freq: Every day | ORAL | Status: DC
  Administered 2017-09-24: 5 mg via ORAL
  Filled 2017-09-23: qty 5

## 2017-09-23 MED ORDER — PANTOPRAZOLE SODIUM 40 MG PO TBEC
40.0000 mg | DELAYED_RELEASE_TABLET | Freq: Every day | ORAL | Status: DC
  Administered 2017-09-24: 40 mg via ORAL
  Filled 2017-09-23: qty 1

## 2017-09-23 MED ORDER — POLYETHYLENE GLYCOL 3350 17 G PO PACK: 17.0000 g | PACK | Freq: Every day | ORAL | Status: DC | PRN

## 2017-09-23 MED ORDER — GENTAMICIN SULFATE 0.1 % EX CREA
1.0000 "application " | TOPICAL_CREAM | Freq: Every day | CUTANEOUS | Status: DC
  Administered 2017-09-24: 1 via TOPICAL
  Filled 2017-09-23: qty 15

## 2017-09-23 MED ORDER — ACETAMINOPHEN 650 MG RE SUPP: 650.0000 mg | Freq: Four times a day (QID) | RECTAL | Status: DC | PRN

## 2017-09-23 MED ORDER — HYDROMORPHONE HCL 2 MG PO TABS: 2.0000 mg | ORAL_TABLET | Freq: Four times a day (QID) | ORAL | Status: DC | PRN

## 2017-09-23 MED ORDER — MORPHINE SULFATE (PF) 2 MG/ML IV SOLN
INTRAVENOUS | Status: AC
  Filled 2017-09-23: qty 2

## 2017-09-23 MED ORDER — DOCUSATE SODIUM 100 MG PO CAPS
100.0000 mg | ORAL_CAPSULE | Freq: Two times a day (BID) | ORAL | Status: DC
  Administered 2017-09-24: 100 mg via ORAL
  Filled 2017-09-23: qty 1

## 2017-09-23 MED ORDER — MORPHINE SULFATE (PF) 4 MG/ML IV SOLN
4.0000 mg | Freq: Once | INTRAVENOUS | Status: AC
  Administered 2017-09-23: 4 mg via INTRAVENOUS
  Filled 2017-09-23: qty 1

## 2017-09-23 MED ORDER — ONDANSETRON HCL 4 MG/2ML IJ SOLN
4.0000 mg | Freq: Four times a day (QID) | INTRAMUSCULAR | Status: DC | PRN
  Administered 2017-09-24 (×2): 4 mg via INTRAVENOUS
  Filled 2017-09-23 (×2): qty 2

## 2017-09-23 MED ORDER — HYDROCODONE-ACETAMINOPHEN 5-325 MG PO TABS
1.0000 | ORAL_TABLET | ORAL | Status: DC | PRN
  Administered 2017-09-24: 2 via ORAL
  Filled 2017-09-23: qty 2

## 2017-09-23 MED ORDER — ATORVASTATIN CALCIUM 20 MG PO TABS
20.0000 mg | ORAL_TABLET | Freq: Every day | ORAL | Status: DC
  Administered 2017-09-24: 20 mg via ORAL
  Filled 2017-09-23: qty 1

## 2017-09-23 MED ORDER — ONDANSETRON HCL 4 MG PO TABS
4.0000 mg | ORAL_TABLET | Freq: Four times a day (QID) | ORAL | Status: DC | PRN
  Filled 2017-09-23: qty 1

## 2017-09-23 MED ORDER — MORPHINE SULFATE (PF) 4 MG/ML IV SOLN
4.0000 mg | Freq: Once | INTRAVENOUS | Status: AC
  Administered 2017-09-23: 4 mg via INTRAVENOUS

## 2017-09-23 NOTE — ED Notes (Signed)
Patient transported to CT 

## 2017-09-23 NOTE — ED Provider Notes (Signed)
[facility] Emergency Department Provider Note  ____________________________________________  Time seen: Approximately 6:10 PM  I have reviewed the triage vital signs and the nursing notes.   HISTORY  Chief Complaint Back Pain   HPI Joseph [name] is a 52 y.o. male with a history of amyloidosis, ESRD on evening peritoneal dialysis, hypertension, hyperlipidemia, coronary artery disease, and recent admission to the hospital month ago for spontaneous right adrenal hemorrhage who presents for evaluation of right-sided shoulder blade pain. Patient reports that his symptoms are identical to his prior admission a month ago. He reports that the pain started this morning initially mild and located at the right shoulder blade. Patient took 5mg  of oxycontin and tried to rest. The pain became progressively worse and then severe. the pain now as 5 out of 10, dull, constant, located in the right shoulder blade, radiating to the chest. Patient reports that when the pain was severe he became diaphoretic and nauseous. No vomiting, no diarrhea, no constipation, abdominal pain, normal BM this morning. No fever, no cough or URI symptoms. Patient last underwent dialysis this past night. He denies shortness of breath. The pain is not pleuritic in nature. No personal or family history of ischemic heart disease, blood clots, recent travel or immobilization, leg pain or swelling, hemoptysis.  Past Medical History:  Diagnosis Date  . Amyloidosis ([place])   . Blood clot in abdominal vein    happened around age 1 due to traumatic injury  . Blood dyscrasia    amyloidosis  . Central serous chorioretinopathy   . Dry eye   . GERD (gastroesophageal reflux disease)   . Heart murmur    never followed up with this, no problems  . Hyperlipidemia   . Hypertension    improved since starting dialysis  . Pneumonia    as child  . Renal insufficiency     Patient Active Problem List   Diagnosis  Date Noted  . Intractable back pain 09/23/2017  . Coronary artery calcification seen on CT scan 09/11/2017  . Chest pain 08/06/2017  . Splenomegaly 06/05/2017  . Sepsis ([place]) 01/29/2017  . Right lower lobe pneumonia ([place]) 01/29/2017  . ESRD on peritoneal dialysis ([place]) 09/12/2015  . Central serous chorioretinopathy of both eyes 09/12/2015  . Hyperlipidemia 09/12/2015  . GERD (gastroesophageal reflux disease) 09/12/2015  . Hepatitis B vaccination not up to date 09/12/2015  . Renal amyloidosis ([place]) 08/10/2015  . COPD (chronic obstructive pulmonary disease) ([place]) 06/02/2015  . Perinephric hematoma 04/07/2015  . Essential hypertension 04/06/2015    Past Surgical History:  Procedure Laterality Date  . AORTOGRAM Bilateral 04/11/2015   Procedure: Aortogram;  Surgeon: Katha Cabal, MD;  Location: [facility];  Service: Cardiovascular;  Laterality: Bilateral;  . CAPD INSERTION N/A 07/09/2015   Procedure: LAPAROSCOPIC INSERTION CONTINUOUS AMBULATORY PERITONEAL DIALYSIS  (CAPD) CATHETER;  Surgeon: Katha Cabal, MD;  Location: [facility];  Service: Vascular;  Laterality: N/A;  . COLONOSCOPY WITH PROPOFOL N/A 01/11/2016   Procedure: COLONOSCOPY WITH PROPOFOL;  Surgeon: [name], MD;  Location: [facility];  Service: Endoscopy;  Laterality: N/A;  . EXPLORATORY LAPAROTOMY     done to find and remove abdominal blood clot as a teenager  . IMPLANTATION / PLACEMENT PERMANENT EPIDURAL CATHETER Right 2016  . PERIPHERAL VASCULAR CATHETERIZATION Left 04/11/2015   Procedure: Embolization;  Surgeon: Katha Cabal, MD;  Location: [facility];  Service: Cardiovascular;  Laterality: Left;  . PERIPHERAL VASCULAR CATHETERIZATION N/A 04/16/2015  Procedure: Dialysis/Perma Catheter Insertion;  Surgeon: Katha Cabal, MD;  Location: [facility];  Service: Cardiovascular;  Laterality: N/A;  . PERIPHERAL VASCULAR CATHETERIZATION N/A 08/24/2015   Procedure: Dialysis/Perma  Catheter Removal;  Surgeon: Katha Cabal, MD;  Location: [facility];  Service: Cardiovascular;  Laterality: N/A;  . RENAL BIOPSY, PERCUTANEOUS  04/06/2015      . TONSILLECTOMY      Prior to Admission medications   Medication Sig Start Date End Date Taking? Authorizing Provider  Aflibercept (EYLEA) 2 MG/0.05ML SOLN 1 Dose by Intravitreal route every 30 (thirty) days.   Yes [provider]  albuterol (PROVENTIL HFA;VENTOLIN HFA) 108 (90 Base) MCG/ACT inhaler Inhale 2 puffs into the lungs every 6 (six) hours as needed for wheezing or shortness of breath. 05/24/17  Yes K[place], Maretta Bees, MD  atorvastatin (LIPITOR) 20 MG tablet TAKE 1 TABLET BY MOUTH ONCE DAILY 08/20/17  Yes [place], Modena Nunnery, MD  cycloSPORINE (RESTASIS) 0.05 % ophthalmic emulsion Place 1 drop into both eyes daily.    Yes [provider]  Epoetin Alfa (EPOGEN IJ) Inject as directed every 30 (thirty) days. Administered at Dialysis center.   Yes [provider]  ezetimibe (ZETIA) 10 MG tablet Take 1 tablet (10 mg total) by mouth daily. 09/11/17 12/10/17 Yes Goll[place], Kathlene November, MD  folic acid (FOLVITE) 1 MG tablet Take 5 mg by mouth daily. 03/14/16  Yes [provider]  gentamicin cream (GARAMYCIN) 0.1 % Apply 1 application topically at bedtime.  12/31/15  Yes [provider]  HYDROmorphone (DILAUDID) 2 MG tablet Take 1 tablet (2 mg total) by mouth every 6 (six) hours as needed for severe pain. 08/10/17  Yes [place], Modena Nunnery, MD  Hypromellose (ARTIFICIAL TEARS OP) Apply 1 drop to eye as needed.   Yes [provider]  losartan (COZAAR) 100 MG tablet Take 1 tablet (100 mg total) by mouth daily. Patient taking differently: Take 50 mg by mouth daily.  11/16/15  Yes [place], Modena Nunnery, MD  ondansetron (ZOFRAN-ODT) 4 MG disintegrating tablet Take 1 tablet (4 mg total) by mouth every 8 (eight) hours as needed for nausea or vomiting. 04/21/15  Yes Vaughan Basta, MD  oxyCODONE  (OXYCONTIN) 10 mg 12 hr tablet Take 5-10 mg by mouth every 12 (twelve) hours.   Yes [provider]  pantoprazole (PROTONIX) 40 MG tablet Take 1 tablet (40 mg total) by mouth daily. 09/04/17  Yes , Modena Nunnery, MD  polyethylene glycol [facility] / GLYCOLAX) packet Take 17 g by mouth daily. Patient taking differently: Take 17 g by mouth daily as needed.  08/10/17  Yes Gladstone Lighter, MD  VELTASSA 8.4 g packet Take 1 packet by mouth daily. 12/20/15  Yes [provider]    Allergies Ciprofloxacin hcl; Doxycycline hyclate; and Phenol-glycerin  Family History  Problem Relation Age of Onset  . Hypertension Mother   . Birth defects Sister   . Multiple sclerosis Sister   . Heart disease Maternal Uncle   . Early death Maternal Uncle   . Heart attack Maternal Uncle   . Hypertension Other   . Heart attack Maternal Uncle   . Heart disease Maternal Uncle     Social History Social History   Tobacco Use  . Smoking status: Former Smoker    Packs/day: 0.50    Years: 20.00    Pack years: 10.00    Types: Cigarettes    Last attempt to quit: 11/04/2006    Years since quitting: 10.8  .  Smokeless tobacco: Never Used  Substance Use Topics  . Alcohol use: No  . Drug use: No    Review of Systems  Constitutional: Negative for fever. Eyes: Negative for visual changes. ENT: Negative for sore throat. Neck: No neck pain  Cardiovascular: + chest pain. Respiratory: Negative for shortness of breath. Gastrointestinal: Negative for abdominal pain, vomiting or diarrhea. + nausea Genitourinary: Negative for dysuria. Musculoskeletal: Negative for back pain. + R shoulder blade pain Skin: Negative for rash. Neurological: Negative for headaches, weakness or numbness. Psych: No SI or HI  ____________________________________________   PHYSICAL EXAM:  VITAL SIGNS: ED Triage Vitals  Enc Vitals Group     BP 09/23/17 1808 (!) 177/113     Pulse Rate 09/23/17 1808 68     Resp  09/23/17 1808 20     Temp 09/23/17 1808 98.1 F (36.7 C)     Temp Source 09/23/17 1808 Oral     SpO2 09/23/17 1808 100 %     Weight 09/23/17 1809 191 lb (86.6 kg)     Height 09/23/17 1809 5\' 11"  (1.803 m)     Head Circumference --      Peak Flow --      Pain Score 09/23/17 1809 6     Pain Loc --      Pain Edu? --      Excl. in [place]? --     Constitutional: Alert and oriented. Well appearing and in no apparent distress. HEENT:      Head: Normocephalic and atraumatic.         Eyes: Conjunctivae are normal. Sclera is non-icteric.       Mouth/Throat: Mucous membranes are moist.       Neck: Supple with no signs of meningismus. Cardiovascular: Regular rate and rhythm. No murmurs, gallops, or rubs. 2+ symmetrical distal pulses are present in all extremities. No JVD. Respiratory: Normal respiratory effort. Lungs are clear to auscultation bilaterally. No wheezes, crackles, or rhonchi.  Gastrointestinal: Soft, non tender, and non distended with positive bowel sounds. No rebound or guarding. Musculoskeletal: Nontender with normal range of motion in all extremities. No edema, cyanosis, or erythema of extremities. Neurologic: Normal speech and language. Face is symmetric. Moving all extremities. No gross focal neurologic deficits are appreciated. Skin: Skin is warm, dry and intact. No rash noted. Psychiatric: Mood and affect are normal. Speech and behavior are normal.  ____________________________________________   LABS (all labs ordered are listed, but only abnormal results are displayed)  Labs Reviewed  CBC WITH DIFFERENTIAL/PLATELET - Abnormal; Notable for the following components:      Result Value   RBC 4.09 (*)    Hemoglobin 12.9 (*)    HCT 38.7 (*)    RDW 19.5 (*)    Neutro Abs 7.4 (*)    Eosinophils Absolute 0.9 (*)    All other components within normal limits  COMPREHENSIVE METABOLIC PANEL - Abnormal; Notable for the following components:   Glucose, Bld 139 (*)    BUN 40 (*)      Creatinine, Ser 4.37 (*)    Calcium 8.3 (*)    Albumin 2.9 (*)    Alkaline Phosphatase 164 (*)    GFR calc non Af Amer 14 (*)    GFR calc Af Amer 16 (*)    All other components within normal limits  LIPASE, BLOOD  LACTIC ACID, PLASMA  TROPONIN I  BASIC METABOLIC PANEL  CBC  FIBRIN DERIVATIVES D-DIMER ([facility] ONLY)   ____________________________________________  EKG  ED  ECG REPORT I, Rudene Re, the attending physician, personally viewed and interpreted this ECG.  Normal sinus rhythm, rate of 65, normal intervals, normal axis, and no ST elevations or depressions. unchanged from prior from 2 weeks ago ____________________________________________  RADIOLOGY  I have personally reviewed the images performed during this visit and I agree with the Radiologist's read.   Interpretation by Radiologist:  Ct Abdomen Pelvis Wo Contrast  Result Date: 09/23/2017 CLINICAL DATA:  Back pain and pressure radiating to the nipple per patient. Patient states a month ago that he had an adrenal hemorrhage and symptoms are similar. EXAM: CT CHEST, ABDOMEN AND PELVIS WITHOUT CONTRAST TECHNIQUE: Multidetector CT imaging of the chest, abdomen and pelvis was performed following the standard protocol without IV contrast. COMPARISON:  Chest CT 08/06/2017, CT abdomen and pelvis 08/08/2017 FINDINGS: CT CHEST FINDINGS Cardiovascular: Normal size heart without pericardial effusion. Minimal coronary arteriosclerosis along the LAD. Nonaneurysmal thoracic aorta without atherosclerotic calcifications. Unremarkable noncontrast appearance of the pulmonary vasculature. Mediastinum/Nodes: Stable appearance of the mediastinal and hilar lymph nodes, the largest measuring 11 mm short axis in the right lower paratracheal station. No definite hilar adenopathy given limitations of this noncontrast study. Esophagus is unremarkable. Patent trachea and mainstem bronchi. No thyroid mass. Lungs/Pleura: Chronic bibasilar  atelectasis and scarring. Multiple extrapleural soft tissue densities/lesions are redemonstrated in the posterior hemithoraces bilaterally, the largest is posteriorly on the right measuring 1.3 x 2.6 cm versus 1.5 x 2.7 cm previously, stable in appearance possibly neurogenic in etiology. Clearing of previous small effusions. No pneumothorax. Musculoskeletal: Degenerative change is noted along the dorsal spine without worrisome lytic osseous sclerotic lesions. CT ABDOMEN PELVIS FINDINGS Hepatobiliary: Mild hepatic steatosis without focal liver lesions. Uncomplicated cholelithiasis. Pancreas: Unremarkable. No pancreatic ductal dilatation or surrounding inflammatory changes. Spleen: Stable mild splenomegaly without focal mass. The spleen measures 15.4 x 4.9 x 13.8 cm (volume = 550 cm^3). Adrenals/Urinary Tract: Stigmata of prior right adrenal hemorrhage now measuring 4.5 x 4.7 cm versus 6.3 x 6 cm. No new focus of hemorrhage is identified. Embolization coils are noted about the left renal pelvis and hilum as before. Unchanged exophytic interpolar right renal cyst measuring 3.5 cm. Scarring noted about the left kidney related to prior hematoma, unchanged in appearance with exophytic fatty focus seen off the upper. No renal or ureteral calculi. No hydronephrosis. The bladder is unremarkable for degree of distention. Stomach/Bowel: Stomach is within normal limits. No findings of acute appendicitis. Scattered left-sided colonic diverticulosis. No evidence of bowel wall thickening, distention, or inflammatory changes. Vascular/Lymphatic: Mild aortoiliac atherosclerosis. Hazy mesenteric fat with tiny mesenteric lymph nodes possibly representing stigmata of sclerosing mesenteritis. Reproductive: Prostate is unremarkable. Other: Trace fluid in the abdomen and pelvis likely from peritoneal dialysate. Right pelvic location of peritoneal dialysis catheter. Musculoskeletal: No acute or significant osseous findings. IMPRESSION:  Chest CT: 1. Stable extraperitoneal soft tissue nodules likely neurogenic in origin. 2. No acute cardiopulmonary disease. CT abdomen and pelvis 1. Uncomplicated cholelithiasis. 2. Stable mild splenomegaly. 3. Smaller right adrenal hemorrhage since recent comparison now measuring 4.5 x 4.7 cm. 4. Hazy mesenteric fat with tiny nodules likely representing small nodes compatible with sclerosing mesenteritis. This is stable as well. Electronically Signed   By: Ashley Royalty M.D.   On: 09/23/2017 20:44   Dg Chest 2 View  Result Date: 09/23/2017 CLINICAL DATA:  Chest pain EXAM: CHEST - 2 VIEW COMPARISON:  Chest CT 08/06/2017 FINDINGS: Right basilar atelectasis. No other consolidation. No pleural effusion or pneumothorax. No pulmonary edema. Normal cardiomediastinal  contours. IMPRESSION: Right basilar atelectasis. Electronically Signed   By: Ulyses Jarred M.D.   On: 09/23/2017 18:45   Ct Chest Wo Contrast  Result Date: 09/23/2017 CLINICAL DATA:  Back pain and pressure radiating to the nipple per patient. Patient states a month ago that he had an adrenal hemorrhage and symptoms are similar. EXAM: CT CHEST, ABDOMEN AND PELVIS WITHOUT CONTRAST TECHNIQUE: Multidetector CT imaging of the chest, abdomen and pelvis was performed following the standard protocol without IV contrast. COMPARISON:  Chest CT 08/06/2017, CT abdomen and pelvis 08/08/2017 FINDINGS: CT CHEST FINDINGS Cardiovascular: Normal size heart without pericardial effusion. Minimal coronary arteriosclerosis along the LAD. Nonaneurysmal thoracic aorta without atherosclerotic calcifications. Unremarkable noncontrast appearance of the pulmonary vasculature. Mediastinum/Nodes: Stable appearance of the mediastinal and hilar lymph nodes, the largest measuring 11 mm short axis in the right lower paratracheal station. No definite hilar adenopathy given limitations of this noncontrast study. Esophagus is unremarkable. Patent trachea and mainstem bronchi. No thyroid mass.  Lungs/Pleura: Chronic bibasilar atelectasis and scarring. Multiple extrapleural soft tissue densities/lesions are redemonstrated in the posterior hemithoraces bilaterally, the largest is posteriorly on the right measuring 1.3 x 2.6 cm versus 1.5 x 2.7 cm previously, stable in appearance possibly neurogenic in etiology. Clearing of previous small effusions. No pneumothorax. Musculoskeletal: Degenerative change is noted along the dorsal spine without worrisome lytic osseous sclerotic lesions. CT ABDOMEN PELVIS FINDINGS Hepatobiliary: Mild hepatic steatosis without focal liver lesions. Uncomplicated cholelithiasis. Pancreas: Unremarkable. No pancreatic ductal dilatation or surrounding inflammatory changes. Spleen: Stable mild splenomegaly without focal mass. The spleen measures 15.4 x 4.9 x 13.8 cm (volume = 550 cm^3). Adrenals/Urinary Tract: Stigmata of prior right adrenal hemorrhage now measuring 4.5 x 4.7 cm versus 6.3 x 6 cm. No new focus of hemorrhage is identified. Embolization coils are noted about the left renal pelvis and hilum as before. Unchanged exophytic interpolar right renal cyst measuring 3.5 cm. Scarring noted about the left kidney related to prior hematoma, unchanged in appearance with exophytic fatty focus seen off the upper. No renal or ureteral calculi. No hydronephrosis. The bladder is unremarkable for degree of distention. Stomach/Bowel: Stomach is within normal limits. No findings of acute appendicitis. Scattered left-sided colonic diverticulosis. No evidence of bowel wall thickening, distention, or inflammatory changes. Vascular/Lymphatic: Mild aortoiliac atherosclerosis. Hazy mesenteric fat with tiny mesenteric lymph nodes possibly representing stigmata of sclerosing mesenteritis. Reproductive: Prostate is unremarkable. Other: Trace fluid in the abdomen and pelvis likely from peritoneal dialysate. Right pelvic location of peritoneal dialysis catheter. Musculoskeletal: No acute or significant  osseous findings. IMPRESSION: Chest CT: 1. Stable extraperitoneal soft tissue nodules likely neurogenic in origin. 2. No acute cardiopulmonary disease. CT abdomen and pelvis 1. Uncomplicated cholelithiasis. 2. Stable mild splenomegaly. 3. Smaller right adrenal hemorrhage since recent comparison now measuring 4.5 x 4.7 cm. 4. Hazy mesenteric fat with tiny nodules likely representing small nodes compatible with sclerosing mesenteritis. This is stable as well. Electronically Signed   By: Ashley Royalty M.D.   On: 09/23/2017 20:44   Dg Abd 2 Views  Result Date: 09/23/2017 CLINICAL DATA:  Back pain and pressure radiating to the nipple. End-stage renal disease with peritoneal dialysis. Patient's most recent dialysis was last evening. EXAM: ABDOMEN - 2 VIEW COMPARISON:  CT 08/08/2017 FINDINGS: The tip of a peritoneal dialysis catheter projects over the right hemipelvis. A moderate amount of stool is seen within the colon, more so on the right. No bowel obstruction is visualized. Embolization coils are seen projecting over the left upper quadrant. No  free air. No radiopaque calculi. Stable phleboliths are seen bilaterally within the pelvis. No acute osseous appearing abnormality. IMPRESSION: 1. Increased colonic stool burden on the right. No free air nor bowel obstruction. 2. Peritoneal dialysis catheter in the right in the pelvis. Electronically Signed   By: Ashley Royalty M.D.   On: 09/23/2017 19:39      ____________________________________________   PROCEDURES  Procedure(s) performed: None Procedures Critical Care performed:  None ____________________________________________   INITIAL IMPRESSION / ASSESSMENT AND PLAN / ED COURSE  52 y.o. male with a history of amyloidosis, ESRD on evening peritoneal dialysis, hypertension, hyperlipidemia, coronary artery disease, and recent admission to the hospital month ago for spontaneous right adrenal hemorrhage who presents for evaluation of acute onset severe  right-sided shoulder blade pain radiating to R chest. Pain similar to recent admission for spontaneous R adrenal hemorrhage. Patient HD stable, slightly hypertensive, afebrile, lungs CTAB, abdomen soft and non tender. EKG with no evidence of ischemia. Ddx worsening or new spontaneous hemorrhage or complication from previous hemorrhage vs PTX vs PE vs PNA. No abdominal tenderness to suspect SBP. Will give zofran and morphine and check labs.    _________________________ 12:14 AM on 09/24/2017 -----------------------------------------  imaging and labs showing no etiology for patient's pain. Patient remain in acute pain in the emergency room after 2 rounds of morphine. At this time a suspicion that patient might have a pulmonary embolism. I discussed with patient's nephrologist Dr. Holley Raring about possibly getting a CT angiogram. He highly recommended no IV contrast since patient is still dependent on his kidney function to be able to continue peritoneal dialysis at home. At this time patient is hemodynamically stable, no tachycardia, no hypoxia, no tachypnea therefore I believe it is safe for [place] to wait for a VQ scan which has been ordered stat. In the meantime will get Doppler studies of his bilateral lower extremities. If those are positive for DVT patient will need a consult with hematology due to increased risk of bleeding and several prior severe bleeds requiring blood transfusions. Patient's nephrologist recommended consulting vascular for an IVC filter instead of starting patient on blood thinners. Patient was admitted to the hospitalist service. This plan and recommendations was discussed with Dr. Melvyn Novas   As part of my medical decision making, I reviewed the following data within the [place] notes reviewed and incorporated, Labs reviewed , EKG interpreted , Old EKG reviewed, Old chart reviewed, Radiograph reviewed , Discussed with admitting physician , A consult was requested  and obtained from this/these consultant(s) Nephrology, Notes from prior ED visits and [place] Controlled Substance Database    Pertinent labs & imaging results that were available during my care of the patient were reviewed by me and considered in my medical decision making (see chart for details).    ____________________________________________   FINAL CLINICAL IMPRESSION(S) / ED DIAGNOSES  Final diagnoses:  Adrenal hemorrhage ([place])  Chest pain, unspecified type      NEW MEDICATIONS STARTED DURING THIS VISIT:  ED Discharge Orders    None       Note:  This document was prepared using Dragon voice recognition software and may include unintentional dictation errors.    Alfred Levins, [place], MD 09/24/17 [phone]

## 2017-09-23 NOTE — H&P (Signed)
[facility] NAME: Joseph [name]    MR#:  [id number]  DATE OF BIRTH:  08-24-1965  DATE OF ADMISSION:  09/23/2017  PRIMARY CARE PHYSICIAN: Alycia Rossetti, MD   REQUESTING/REFERRING PHYSICIAN:   CHIEF COMPLAINT:   Chief Complaint  Patient presents with  . Back Pain    HISTORY OF PRESENT ILLNESS: Joseph [name]  is a 52 y.o. male with a known history of amyloidosis, hypertension and chronic kidney disease.  Patient was recently hospitalized, 1 month ago for spontaneous right adrenal hemorrhage. He presented to emergency room this time for intractable, severe right upper back pain, that started at rest, early in the morning and progressively got worse despite taking OxyContin.  The pain is constant and dull. It starts just below the right shoulder blade and radiates to the anterior chest, following the rib line.  The pain gets worse with twisting the upper body.  Patient had an episode diaphoresis and nausea due to severe pain, but no vomiting, no diarrhea, no bleeding, no abdominal pain.  No fever/chills, no shortness of breath or cough.  The pain does not seem to get worse with deep breathing.  Patient denies any new medications.  No recent trauma, or increased physical activity.  He had a similar episode before approximately 1 month ago. Blood test done in the emergency room are notable for elevated creatinine level of 4.37, which is the baseline for this patient.  Troponin level is within normal limits.  D-dimer is elevated at 1400.  CT scan of the chest and the abdomen, reviewed by myself, is essentially unremarkable except for improving right adrenal gland hemorrhage. EKG is noted without any acute changes. Patient is admitted for further evaluation and treatment.  PAST MEDICAL HISTORY:   Past Medical History:  Diagnosis Date  . Amyloidosis ([place])   . Blood clot in abdominal vein    happened around age 48 due to traumatic injury   . Blood dyscrasia    amyloidosis  . Central serous chorioretinopathy   . Dry eye   . GERD (gastroesophageal reflux disease)   . Heart murmur    never followed up with this, no problems  . Hyperlipidemia   . Hypertension    improved since starting dialysis  . Pneumonia    as child  . Renal insufficiency     PAST SURGICAL HISTORY:  Past Surgical History:  Procedure Laterality Date  . AORTOGRAM Bilateral 04/11/2015   Procedure: Aortogram;  Surgeon: Katha Cabal, MD;  Location: [facility];  Service: Cardiovascular;  Laterality: Bilateral;  . CAPD INSERTION N/A 07/09/2015   Procedure: LAPAROSCOPIC INSERTION CONTINUOUS AMBULATORY PERITONEAL DIALYSIS  (CAPD) CATHETER;  Surgeon: Katha Cabal, MD;  Location: [facility];  Service: Vascular;  Laterality: N/A;  . COLONOSCOPY WITH PROPOFOL N/A 01/11/2016   Procedure: COLONOSCOPY WITH PROPOFOL;  Surgeon: [name], MD;  Location: [facility];  Service: Endoscopy;  Laterality: N/A;  . EXPLORATORY LAPAROTOMY     done to find and remove abdominal blood clot as a teenager  . IMPLANTATION / PLACEMENT PERMANENT EPIDURAL CATHETER Right 2016  . PERIPHERAL VASCULAR CATHETERIZATION Left 04/11/2015   Procedure: Embolization;  Surgeon: Katha Cabal, MD;  Location: [facility] [facility];  Service: Cardiovascular;  Laterality: Left;  . PERIPHERAL VASCULAR CATHETERIZATION N/A 04/16/2015   Procedure: Dialysis/Perma Catheter Insertion;  Surgeon: Katha Cabal, MD;  Location: [place] [facility];  Service: Cardiovascular;  Laterality: N/A;  .  PERIPHERAL VASCULAR CATHETERIZATION N/A 08/24/2015   Procedure: Dialysis/Perma Catheter Removal;  Surgeon: Katha Cabal, MD;  Location: [facility] [facility];  Service: Cardiovascular;  Laterality: N/A;  . RENAL BIOPSY, PERCUTANEOUS  04/06/2015      . TONSILLECTOMY      SOCIAL HISTORY:  Social History   Tobacco Use  . Smoking status: Former Smoker    Packs/day: 0.50    Years: 20.00     Pack years: 10.00    Types: Cigarettes    Last attempt to quit: 11/04/2006    Years since quitting: 10.8  . Smokeless tobacco: Never Used  Substance Use Topics  . Alcohol use: No    FAMILY HISTORY:  Family History  Problem Relation Age of Onset  . Hypertension Mother   . Birth defects Sister   . Multiple sclerosis Sister   . Heart disease Maternal Uncle   . Early death Maternal Uncle   . Heart attack Maternal Uncle   . Hypertension Other   . Heart attack Maternal Uncle   . Heart disease Maternal Uncle     DRUG ALLERGIES:  Allergies  Allergen Reactions  . Ciprofloxacin Hcl Swelling    High fever  . Doxycycline Hyclate Swelling  . Phenol-Glycerin Swelling    REVIEW OF SYSTEMS:   CONSTITUTIONAL: No fever, fatigue or weakness.  EYES: No blurred or double vision.  EARS, NOSE, AND THROAT: No tinnitus or ear pain.  RESPIRATORY: No cough, shortness of breath, wheezing or hemoptysis.  CARDIOVASCULAR: No chest pain, orthopnea, edema.  GASTROINTESTINAL: No nausea, vomiting, diarrhea or abdominal pain.  GENITOURINARY: No dysuria, hematuria.  ENDOCRINE: No polyuria, nocturia,  HEMATOLOGY: No bleeding SKIN: No rash or lesion. MUSCULOSKELETAL: Positive for right upper back pain, radiating to anterior chest area.   NEUROLOGIC: No focal weakness.  PSYCHIATRY: No anxiety or depression.   MEDICATIONS AT HOME:  Prior to Admission medications   Medication Sig Start Date End Date Taking? Authorizing Provider  Aflibercept (EYLEA) 2 MG/0.05ML SOLN 1 Dose by Intravitreal route every 30 (thirty) days.   Yes [provider]  albuterol (PROVENTIL HFA;VENTOLIN HFA) 108 (90 Base) MCG/ACT inhaler Inhale 2 puffs into the lungs every 6 (six) hours as needed for wheezing or shortness of breath. 05/24/17  Yes Kasa, Maretta Bees, MD  atorvastatin (LIPITOR) 20 MG tablet TAKE 1 TABLET BY MOUTH ONCE DAILY 08/20/17  Yes [place], Modena Nunnery, MD  cycloSPORINE (RESTASIS) 0.05 % ophthalmic emulsion  Place 1 drop into both eyes daily.    Yes [provider]  Epoetin Alfa (EPOGEN IJ) Inject as directed every 30 (thirty) days. Administered at Dialysis center.   Yes [provider]  ezetimibe (ZETIA) 10 MG tablet Take 1 tablet (10 mg total) by mouth daily. 09/11/17 12/10/17 Yes Goll[place], Kathlene November, MD  folic acid (FOLVITE) 1 MG tablet Take 5 mg by mouth daily. 03/14/16  Yes [provider]  gentamicin cream (GARAMYCIN) 0.1 % Apply 1 application topically at bedtime.  12/31/15  Yes [provider]  HYDROmorphone (DILAUDID) 2 MG tablet Take 1 tablet (2 mg total) by mouth every 6 (six) hours as needed for severe pain. 08/10/17  Yes [place], Modena Nunnery, MD  Hypromellose (ARTIFICIAL TEARS OP) Apply 1 drop to eye as needed.   Yes [provider]  losartan (COZAAR) 100 MG tablet Take 1 tablet (100 mg total) by mouth daily. Patient taking differently: Take 50 mg by mouth daily.  11/16/15  Yes [place], Modena Nunnery, MD  ondansetron (ZOFRAN-ODT) 4 MG  disintegrating tablet Take 1 tablet (4 mg total) by mouth every 8 (eight) hours as needed for nausea or vomiting. 04/21/15  Yes Vaughan Basta, MD  oxyCODONE (OXYCONTIN) 10 mg 12 hr tablet Take 5-10 mg by mouth every 12 (twelve) hours.   Yes [provider]  pantoprazole (PROTONIX) 40 MG tablet Take 1 tablet (40 mg total) by mouth daily. 09/04/17  Yes [place], Modena Nunnery, MD  polyethylene glycol [facility] / GLYCOLAX) packet Take 17 g by mouth daily. Patient taking differently: Take 17 g by mouth daily as needed.  08/10/17  Yes Gladstone Lighter, MD  VELTASSA 8.4 g packet Take 1 packet by mouth daily. 12/20/15  Yes [provider]      PHYSICAL EXAMINATION:   VITAL SIGNS: Blood pressure (!) 166/106, pulse 69, temperature 98.1 F (36.7 C), temperature source Oral, resp. rate 18, height 5\' 11"  (1.803 m), weight 86.6 kg (191 lb), SpO2 100 %.  GENERAL:  52 y.o.-year-old patient lying in the bed, in mild to  moderate distress, secondary to right upper back pain.  Patient is status post morphine IV. EYES: Pupils equal, round, reactive to light and accommodation. No scleral icterus. Extraocular muscles intact.  HEENT: Head atraumatic, normocephalic. Oropharynx and nasopharynx clear.  NECK:  Supple, no jugular venous distention. No thyroid enlargement, no tenderness.  LUNGS: Normal breath sounds bilaterally, no wheezing, rales,rhonchi or crepitation. No use of accessory muscles of respiration.  CARDIOVASCULAR: S1, S2 normal.  No S3/S4.  ABDOMEN: Soft, nontender, nondistended. Bowel sounds present. No organomegaly or mass.  EXTREMITIES: No pedal edema, cyanosis, or clubbing.  NEUROLOGIC: No focal weakness. PSYCHIATRIC: The patient is alert and oriented x 3.  SKIN: No obvious rash, lesion, or ulcer.   LABORATORY PANEL:   CBC Recent Labs  Lab 09/23/17 1842  WBC 10.5  HGB 12.9*  HCT 38.7*  PLT 268  MCV 94.7  MCH 31.5  MCHC 33.3  RDW 19.5*  LYMPHSABS 1.7  MONOABS 0.5  EOSABS 0.9*  BASOSABS 0.1   ------------------------------------------------------------------------------------------------------------------  Chemistries  Recent Labs  Lab 09/23/17 1842  NA 141  K 4.2  CL 107  CO2 24  GLUCOSE 139*  BUN 40*  CREATININE 4.37*  CALCIUM 8.3*  AST 33  ALT 32  ALKPHOS 164*  BILITOT 0.5   ------------------------------------------------------------------------------------------------------------------ estimated creatinine clearance is 20.6 mL/min (A) (by C-G formula based on SCr of 4.37 mg/dL (H)). ------------------------------------------------------------------------------------------------------------------ No results for input(s): TSH, T4TOTAL, T3FREE, THYROIDAB in the last 72 hours.  Invalid input(s): FREET3   Coagulation profile No results for input(s): INR, PROTIME in the last 168  hours. ------------------------------------------------------------------------------------------------------------------- No results for input(s): DDIMER in the last 72 hours. -------------------------------------------------------------------------------------------------------------------  Cardiac Enzymes Recent Labs  Lab 09/23/17 1844  TROPONINI <0.03   ------------------------------------------------------------------------------------------------------------------ Invalid input(s): POCBNP  ---------------------------------------------------------------------------------------------------------------  Urinalysis    Component Value Date/Time   COLORURINE STRAW (A) 04/05/2015 0838   APPEARANCEUR CLEAR (A) 04/05/2015 0838   LABSPEC 1.011 04/05/2015 0838   PHURINE 5.0 04/05/2015 0838   GLUCOSEU NEGATIVE 04/05/2015 0838   HGBUR NEGATIVE 04/05/2015 0838   BILIRUBINUR NEGATIVE 04/05/2015 0838   KETONESUR NEGATIVE 04/05/2015 0838   PROTEINUR 100 (A) 04/05/2015 0838   NITRITE NEGATIVE 04/05/2015 0838   LEUKOCYTESUR NEGATIVE 04/05/2015 0838     RADIOLOGY: Ct Abdomen Pelvis Wo Contrast  Result Date: 09/23/2017 CLINICAL DATA:  Back pain and pressure radiating to the nipple per patient. Patient states a month ago that he had an adrenal hemorrhage and symptoms are similar. EXAM: CT CHEST, ABDOMEN  AND PELVIS WITHOUT CONTRAST TECHNIQUE: Multidetector CT imaging of the chest, abdomen and pelvis was performed following the standard protocol without IV contrast. COMPARISON:  Chest CT 08/06/2017, CT abdomen and pelvis 08/08/2017 FINDINGS: CT CHEST FINDINGS Cardiovascular: Normal size heart without pericardial effusion. Minimal coronary arteriosclerosis along the LAD. Nonaneurysmal thoracic aorta without atherosclerotic calcifications. Unremarkable noncontrast appearance of the pulmonary vasculature. Mediastinum/Nodes: Stable appearance of the mediastinal and hilar lymph nodes, the largest  measuring 11 mm short axis in the right lower paratracheal station. No definite hilar adenopathy given limitations of this noncontrast study. Esophagus is unremarkable. Patent trachea and mainstem bronchi. No thyroid mass. Lungs/Pleura: Chronic bibasilar atelectasis and scarring. Multiple extrapleural soft tissue densities/lesions are redemonstrated in the posterior hemithoraces bilaterally, the largest is posteriorly on the right measuring 1.3 x 2.6 cm versus 1.5 x 2.7 cm previously, stable in appearance possibly neurogenic in etiology. Clearing of previous small effusions. No pneumothorax. Musculoskeletal: Degenerative change is noted along the dorsal spine without worrisome lytic osseous sclerotic lesions. CT ABDOMEN PELVIS FINDINGS Hepatobiliary: Mild hepatic steatosis without focal liver lesions. Uncomplicated cholelithiasis. Pancreas: Unremarkable. No pancreatic ductal dilatation or surrounding inflammatory changes. Spleen: Stable mild splenomegaly without focal mass. The spleen measures 15.4 x 4.9 x 13.8 cm (volume = 550 cm^3). Adrenals/Urinary Tract: Stigmata of prior right adrenal hemorrhage now measuring 4.5 x 4.7 cm versus 6.3 x 6 cm. No new focus of hemorrhage is identified. Embolization coils are noted about the left renal pelvis and hilum as before. Unchanged exophytic interpolar right renal cyst measuring 3.5 cm. Scarring noted about the left kidney related to prior hematoma, unchanged in appearance with exophytic fatty focus seen off the upper. No renal or ureteral calculi. No hydronephrosis. The bladder is unremarkable for degree of distention. Stomach/Bowel: Stomach is within normal limits. No findings of acute appendicitis. Scattered left-sided colonic diverticulosis. No evidence of bowel wall thickening, distention, or inflammatory changes. Vascular/Lymphatic: Mild aortoiliac atherosclerosis. Hazy mesenteric fat with tiny mesenteric lymph nodes possibly representing stigmata of sclerosing  mesenteritis. Reproductive: Prostate is unremarkable. Other: Trace fluid in the abdomen and pelvis likely from peritoneal dialysate. Right pelvic location of peritoneal dialysis catheter. Musculoskeletal: No acute or significant osseous findings. IMPRESSION: Chest CT: 1. Stable extraperitoneal soft tissue nodules likely neurogenic in origin. 2. No acute cardiopulmonary disease. CT abdomen and pelvis 1. Uncomplicated cholelithiasis. 2. Stable mild splenomegaly. 3. Smaller right adrenal hemorrhage since recent comparison now measuring 4.5 x 4.7 cm. 4. Hazy mesenteric fat with tiny nodules likely representing small nodes compatible with sclerosing mesenteritis. This is stable as well. Electronically Signed   By: Ashley Royalty M.D.   On: 09/23/2017 20:44   Dg Chest 2 View  Result Date: 09/23/2017 CLINICAL DATA:  Chest pain EXAM: CHEST - 2 VIEW COMPARISON:  Chest CT 08/06/2017 FINDINGS: Right basilar atelectasis. No other consolidation. No pleural effusion or pneumothorax. No pulmonary edema. Normal cardiomediastinal contours. IMPRESSION: Right basilar atelectasis. Electronically Signed   By: Ulyses Jarred M.D.   On: 09/23/2017 18:45   Ct Chest Wo Contrast  Result Date: 09/23/2017 CLINICAL DATA:  Back pain and pressure radiating to the nipple per patient. Patient states a month ago that he had an adrenal hemorrhage and symptoms are similar. EXAM: CT CHEST, ABDOMEN AND PELVIS WITHOUT CONTRAST TECHNIQUE: Multidetector CT imaging of the chest, abdomen and pelvis was performed following the standard protocol without IV contrast. COMPARISON:  Chest CT 08/06/2017, CT abdomen and pelvis 08/08/2017 FINDINGS: CT CHEST FINDINGS Cardiovascular: Normal size heart without pericardial effusion. Minimal  coronary arteriosclerosis along the LAD. Nonaneurysmal thoracic aorta without atherosclerotic calcifications. Unremarkable noncontrast appearance of the pulmonary vasculature. Mediastinum/Nodes: Stable appearance of the  mediastinal and hilar lymph nodes, the largest measuring 11 mm short axis in the right lower paratracheal station. No definite hilar adenopathy given limitations of this noncontrast study. Esophagus is unremarkable. Patent trachea and mainstem bronchi. No thyroid mass. Lungs/Pleura: Chronic bibasilar atelectasis and scarring. Multiple extrapleural soft tissue densities/lesions are redemonstrated in the posterior hemithoraces bilaterally, the largest is posteriorly on the right measuring 1.3 x 2.6 cm versus 1.5 x 2.7 cm previously, stable in appearance possibly neurogenic in etiology. Clearing of previous small effusions. No pneumothorax. Musculoskeletal: Degenerative change is noted along the dorsal spine without worrisome lytic osseous sclerotic lesions. CT ABDOMEN PELVIS FINDINGS Hepatobiliary: Mild hepatic steatosis without focal liver lesions. Uncomplicated cholelithiasis. Pancreas: Unremarkable. No pancreatic ductal dilatation or surrounding inflammatory changes. Spleen: Stable mild splenomegaly without focal mass. The spleen measures 15.4 x 4.9 x 13.8 cm (volume = 550 cm^3). Adrenals/Urinary Tract: Stigmata of prior right adrenal hemorrhage now measuring 4.5 x 4.7 cm versus 6.3 x 6 cm. No new focus of hemorrhage is identified. Embolization coils are noted about the left renal pelvis and hilum as before. Unchanged exophytic interpolar right renal cyst measuring 3.5 cm. Scarring noted about the left kidney related to prior hematoma, unchanged in appearance with exophytic fatty focus seen off the upper. No renal or ureteral calculi. No hydronephrosis. The bladder is unremarkable for degree of distention. Stomach/Bowel: Stomach is within normal limits. No findings of acute appendicitis. Scattered left-sided colonic diverticulosis. No evidence of bowel wall thickening, distention, or inflammatory changes. Vascular/Lymphatic: Mild aortoiliac atherosclerosis. Hazy mesenteric fat with tiny mesenteric lymph nodes  possibly representing stigmata of sclerosing mesenteritis. Reproductive: Prostate is unremarkable. Other: Trace fluid in the abdomen and pelvis likely from peritoneal dialysate. Right pelvic location of peritoneal dialysis catheter. Musculoskeletal: No acute or significant osseous findings. IMPRESSION: Chest CT: 1. Stable extraperitoneal soft tissue nodules likely neurogenic in origin. 2. No acute cardiopulmonary disease. CT abdomen and pelvis 1. Uncomplicated cholelithiasis. 2. Stable mild splenomegaly. 3. Smaller right adrenal hemorrhage since recent comparison now measuring 4.5 x 4.7 cm. 4. Hazy mesenteric fat with tiny nodules likely representing small nodes compatible with sclerosing mesenteritis. This is stable as well. Electronically Signed   By: Ashley Royalty M.D.   On: 09/23/2017 20:44   Dg Abd 2 Views  Result Date: 09/23/2017 CLINICAL DATA:  Back pain and pressure radiating to the nipple. End-stage renal disease with peritoneal dialysis. Patient's most recent dialysis was last evening. EXAM: ABDOMEN - 2 VIEW COMPARISON:  CT 08/08/2017 FINDINGS: The tip of a peritoneal dialysis catheter projects over the right hemipelvis. A moderate amount of stool is seen within the colon, more so on the right. No bowel obstruction is visualized. Embolization coils are seen projecting over the left upper quadrant. No free air. No radiopaque calculi. Stable phleboliths are seen bilaterally within the pelvis. No acute osseous appearing abnormality. IMPRESSION: 1. Increased colonic stool burden on the right. No free air nor bowel obstruction. 2. Peritoneal dialysis catheter in the right in the pelvis. Electronically Signed   By: Ashley Royalty M.D.   On: 09/23/2017 19:39    EKG: Orders placed or performed during the hospital encounter of 09/23/17  . ED EKG  . ED EKG  . EKG 12-Lead  . EKG 12-Lead  . EKG 12-Lead  . EKG 12-Lead    IMPRESSION AND PLAN:  1.  Intractable upper  back pain, of unclear etiology.  Will  rule out PE.  D-dimer level is elevated.  Patient cannot have CAT scan with contrast due to his poor kidney function.  Will check venous Doppler of bilateral lower extremities and VQ scan.  Will also rule out thoracic radiculopathy with T spine MRI.  Continue pain control. 2.  Amyloidosis, stable, continue maintenance treatment and monitor closely.  3.  End-stage renal disease, dialysis.  Continue treatment per nephrology.  Avoid nephrotoxic agents. 4.  Hypertension.  Currently blood pressure is elevated.  Will use Norvasc to correct hypertension.  Continue to monitor blood pressure closely.   All the records are reviewed and case discussed with ED provider. Management plans discussed with the patient, family and they are in agreement.  CODE STATUS:    Code Status Orders  (From admission, onward)        Start     Ordered   09/23/17 2325  Full code  Continuous     09/23/17 2324    Code Status History    Date Active Date Inactive Code Status Order ID Comments User Context   08/06/2017 1113 08/09/2017 1726 Full Code [id number]  Dustin Flock, MD ED   01/29/2017 1419 01/31/2017 1439 Full Code [id number]  Theodoro Grist, MD Inpatient   05/31/2015 0959 06/01/2015 0331 Full Code [id number]  Sabino Dick, MD HOV   04/06/2015 1239 04/21/2015 1345 Full Code [id number]  Anthonette Legato, MD Inpatient       TOTAL TIME TAKING CARE OF THIS PATIENT: 45 minutes.    Amelia Jo M.D on 09/23/2017 at 11:58 PM  Between 7am to 6pm - Pager - [phone]  After 6pm go to [email or web address] - password [email or web address] [facility]  [place] [facility]  Office  [phone]  CC: Primary care physician; Alycia Rossetti, MD

## 2017-09-23 NOTE — ED Triage Notes (Signed)
Pt arrived via EMS from home c/o back pain that feels like pressure (started around 1600), it radiates to the nipple per pt.  Pt states a month ago pt had a adrenal hemorrhage and the symptoms are familiar.  Pt is a ESRD, peritoneal dialysis, had treatment last night.

## 2017-09-23 NOTE — ED Notes (Signed)
Patient given water at this time.  

## 2017-09-24 ENCOUNTER — Inpatient Hospital Stay: Payer: 59

## 2017-09-24 LAB — SEDIMENTATION RATE: Sed Rate: 43 mm/hr — ABNORMAL HIGH (ref 0–20)

## 2017-09-24 LAB — FIBRIN DERIVATIVES D-DIMER (ARMC ONLY): FIBRIN DERIVATIVES D-DIMER (ARMC): 1391.18 ng{FEU}/mL — AB (ref 0.00–499.00)

## 2017-09-24 LAB — CBC
HEMATOCRIT: 38.6 % — AB (ref 40.0–52.0)
Hemoglobin: 12.3 g/dL — ABNORMAL LOW (ref 13.0–18.0)
MCH: 30.3 pg (ref 26.0–34.0)
MCHC: 31.7 g/dL — ABNORMAL LOW (ref 32.0–36.0)
MCV: 95.6 fL (ref 80.0–100.0)
PLATELETS: 269 10*3/uL (ref 150–440)
RBC: 4.04 MIL/uL — AB (ref 4.40–5.90)
RDW: 19.9 % — AB (ref 11.5–14.5)
WBC: 12.2 10*3/uL — ABNORMAL HIGH (ref 3.8–10.6)

## 2017-09-24 LAB — BASIC METABOLIC PANEL
Anion gap: 8 (ref 5–15)
BUN: 40 mg/dL — AB (ref 6–20)
CHLORIDE: 109 mmol/L (ref 101–111)
CO2: 26 mmol/L (ref 22–32)
CREATININE: 4.23 mg/dL — AB (ref 0.61–1.24)
Calcium: 8.3 mg/dL — ABNORMAL LOW (ref 8.9–10.3)
GFR calc Af Amer: 17 mL/min — ABNORMAL LOW (ref >60)
GFR calc non Af Amer: 15 mL/min — ABNORMAL LOW (ref >60)
Glucose, Bld: 116 mg/dL — ABNORMAL HIGH (ref 65–99)
Potassium: 4.3 mmol/L (ref 3.5–5.1)
Sodium: 143 mmol/L (ref 135–145)

## 2017-09-24 LAB — LACTIC ACID, PLASMA
LACTIC ACID, VENOUS: 1 mmol/L (ref 0.5–1.9)
LACTIC ACID, VENOUS: 1.1 mmol/L (ref 0.5–1.9)

## 2017-09-24 LAB — GLUCOSE, CAPILLARY: Glucose-Capillary: 106 mg/dL — ABNORMAL HIGH (ref 65–99)

## 2017-09-24 MED ORDER — TECHNETIUM TO 99M ALBUMIN AGGREGATED
4.0110 | Freq: Once | INTRAVENOUS | Status: AC | PRN
  Administered 2017-09-24: 4.011 via INTRAVENOUS

## 2017-09-24 MED ORDER — DOCUSATE SODIUM 100 MG PO CAPS
200.0000 mg | ORAL_CAPSULE | Freq: Two times a day (BID) | ORAL | Status: DC
  Administered 2017-09-24: 200 mg via ORAL
  Filled 2017-09-24: qty 2

## 2017-09-24 MED ORDER — DELFLEX-LC/1.5% DEXTROSE 344 MOSM/L IP SOLN
INTRAPERITONEAL | Status: DC
  Filled 2017-09-24 (×2): qty 3000

## 2017-09-24 MED ORDER — HYDROCODONE-ACETAMINOPHEN 5-325 MG PO TABS: 1.0000 | ORAL_TABLET | Freq: Four times a day (QID) | ORAL | Status: DC | PRN

## 2017-09-24 MED ORDER — HYDROMORPHONE HCL 2 MG PO TABS
2.0000 mg | ORAL_TABLET | Freq: Four times a day (QID) | ORAL | Status: DC | PRN
  Administered 2017-09-24: 2 mg via ORAL
  Filled 2017-09-24: qty 1

## 2017-09-24 MED ORDER — TECHNETIUM TC 99M DIETHYLENETRIAME-PENTAACETIC ACID
32.8560 | Freq: Once | INTRAVENOUS | Status: AC | PRN
  Administered 2017-09-24: 32.856 via INTRAVENOUS

## 2017-09-24 MED ORDER — TRAMADOL HCL 50 MG PO TABS
50.0000 mg | ORAL_TABLET | Freq: Four times a day (QID) | ORAL | Status: DC | PRN
  Administered 2017-09-24: 50 mg via ORAL
  Filled 2017-09-24: qty 1

## 2017-09-24 MED ORDER — CYCLOBENZAPRINE HCL 10 MG PO TABS: 5.0000 mg | ORAL_TABLET | Freq: Three times a day (TID) | ORAL | Status: DC | PRN

## 2017-09-24 NOTE — Progress Notes (Signed)
CCPD TX inititated   09/24/17 2030  Peritoneal Catheter Left lower abdomen Continuous cycling  Placement Date: 07/28/15   Catheter Location: Left lower abdomen  Dialysis Type: Continuous cycling  Site Assessment Clean;Dry;Intact  Drainage Description None  Catheter status Accessed  Dressing Gauze/Drain sponge  Dressing Status Clean;Dry;Intact  Dressing Intervention New dressing  Cycler Setup  Total Number of Exchanges 4  Fill Volume 2000  Last Fill Volume 300  Fill Time - Minute(s) 20  Dwell Time - Hour(s) 1  Dwell Time - Minute(s) 35  Drain Time - Minute(s) 20 mins  Hand-Off documentation  Report given to (Full Name) Beatris Ship, RN

## 2017-09-24 NOTE — Progress Notes (Signed)
Per Dr. Jerelyn Charles, MRI can be done first thing in morning when shift begins.  Joseph [name]  09/24/2017  4:28 AM

## 2017-09-24 NOTE — Progress Notes (Signed)
Helene Kelp from ultrasound arrives to do venous doppler on patient.  Christene Slates  09/24/2017  1:35 AM

## 2017-09-24 NOTE — Progress Notes (Signed)
[facility]  ROUNDING NOTE   Subjective:   [name] admitted to [name] on 09/23/2017 for Adrenal hemorrhage [facility]) [E27.49] Chest pain, unspecified type [R07.9]  Missed peritoneal dialysis last night.   Objective:  Vital signs in last 24 hours:  Temp:  [98.1 F (36.7 C)-98.9 F (37.2 C)] 98.9 F (37.2 C) (04/01 1229) Pulse Rate:  [62-87] 71 (04/01 1229) Resp:  [11-24] 16 (04/01 1229) BP: (133-177)/(88-113) 133/88 (04/01 1229) SpO2:  [94 %-100 %] 97 % (04/01 1229) Weight:  [85.8 kg (189 lb 3.2 oz)-86.6 kg (191 lb)] 85.8 kg (189 lb 3.2 oz) (04/01 0409)  Weight change:  Filed Weights   09/23/17 1809 09/24/17 0409  Weight: 86.6 kg (191 lb) 85.8 kg (189 lb 3.2 oz)    Intake/Output: I/O last 3 completed shifts: In: -  Out: 15 [Urine:15]   Intake/Output this shift:  Total I/O In: 480 [P.O.:480] Out: 100 [Urine:100]  Physical Exam: General: NAD,   Head: Normocephalic, atraumatic. Moist oral mucosal membranes  Eyes: Anicteric, PERRL  Neck: Supple, trachea midline  Lungs:  Clear to auscultation  Heart: Regular rate and rhythm  Abdomen:  Soft, nontender,   Extremities: no peripheral edema.  Neurologic: Nonfocal, moving all four extremities  Skin: No lesions  Access: PD catheter    Basic Metabolic Panel: Recent Labs  Lab 09/23/17 1842 09/24/17 0035  NA 141 143  K 4.2 4.3  CL 107 109  CO2 24 26  GLUCOSE 139* 116*  BUN 40* 40*  CREATININE 4.37* 4.23*  CALCIUM 8.3* 8.3*    Liver Function Tests: Recent Labs  Lab 09/23/17 1842  AST 33  ALT 32  ALKPHOS 164*  BILITOT 0.5  PROT 6.6  ALBUMIN 2.9*   Recent Labs  Lab 09/23/17 1842  LIPASE 44   No results for input(s): AMMONIA in the last 168 hours.  CBC: Recent Labs  Lab 09/23/17 1842 09/24/17 0035  WBC 10.5 12.2*  NEUTROABS 7.4*  --   HGB 12.9* 12.3*  HCT 38.7* 38.6*  MCV 94.7 95.6  PLT 268 269    Cardiac Enzymes: Recent Labs  Lab 09/23/17 1844  TROPONINI <0.03     BNP: Invalid input(s): POCBNP  CBG: Recent Labs  Lab 09/24/17 0800  GLUCAP 106*    Microbiology: Results for orders placed or performed during the [name] encounter of 08/06/17  MRSA PCR Screening     Status: None   Collection Time: 08/06/17  3:47 PM  Result Value Ref Range Status   MRSA by PCR NEGATIVE NEGATIVE Final    Comment:        The GeneXpert MRSA Assay (FDA approved for NASAL specimens only), is one component of a comprehensive MRSA colonization surveillance program. It is not intended to diagnose MRSA infection nor to guide or monitor treatment for MRSA infections. Performed at [facility], [address] [place], [place] [address]     Coagulation Studies: No results for input(s): LABPROT, INR in the last 72 hours.  Urinalysis: No results for input(s): COLORURINE, LABSPEC, PHURINE, GLUCOSEU, HGBUR, BILIRUBINUR, KETONESUR, PROTEINUR, UROBILINOGEN, NITRITE, LEUKOCYTESUR in the last 72 hours.  Invalid input(s): APPERANCEUR    Imaging: Ct Abdomen Pelvis Wo Contrast  Result Date: 09/23/2017 CLINICAL DATA:  Back pain and pressure radiating to the nipple per patient. Patient states a month ago that he had an adrenal hemorrhage and symptoms are similar. EXAM: CT CHEST, ABDOMEN AND PELVIS WITHOUT CONTRAST TECHNIQUE: Multidetector CT imaging of the chest, abdomen and pelvis  was performed following the standard protocol without IV contrast. COMPARISON:  Chest CT 08/06/2017, CT abdomen and pelvis 08/08/2017 FINDINGS: CT CHEST FINDINGS Cardiovascular: Normal size heart without pericardial effusion. Minimal coronary arteriosclerosis along the LAD. Nonaneurysmal thoracic aorta without atherosclerotic calcifications. Unremarkable noncontrast appearance of the pulmonary vasculature. Mediastinum/Nodes: Stable appearance of the mediastinal and hilar lymph nodes, the largest measuring 11 mm short axis in the right lower paratracheal station. No definite hilar  adenopathy given limitations of this noncontrast study. Esophagus is unremarkable. Patent trachea and mainstem bronchi. No thyroid mass. Lungs/Pleura: Chronic bibasilar atelectasis and scarring. Multiple extrapleural soft tissue densities/lesions are redemonstrated in the posterior hemithoraces bilaterally, the largest is posteriorly on the right measuring 1.3 x 2.6 cm versus 1.5 x 2.7 cm previously, stable in appearance possibly neurogenic in etiology. Clearing of previous small effusions. No pneumothorax. Musculoskeletal: Degenerative change is noted along the dorsal spine without worrisome lytic osseous sclerotic lesions. CT ABDOMEN PELVIS FINDINGS Hepatobiliary: Mild hepatic steatosis without focal liver lesions. Uncomplicated cholelithiasis. Pancreas: Unremarkable. No pancreatic ductal dilatation or surrounding inflammatory changes. Spleen: Stable mild splenomegaly without focal mass. The spleen measures 15.4 x 4.9 x 13.8 cm (volume = 550 cm^3). Adrenals/Urinary Tract: Stigmata of prior right adrenal hemorrhage now measuring 4.5 x 4.7 cm versus 6.3 x 6 cm. No new focus of hemorrhage is identified. Embolization coils are noted about the left renal pelvis and hilum as before. Unchanged exophytic interpolar right renal cyst measuring 3.5 cm. Scarring noted about the left kidney related to prior hematoma, unchanged in appearance with exophytic fatty focus seen off the upper. No renal or ureteral calculi. No hydronephrosis. The bladder is unremarkable for degree of distention. Stomach/Bowel: Stomach is within normal limits. No findings of acute appendicitis. Scattered left-sided colonic diverticulosis. No evidence of bowel wall thickening, distention, or inflammatory changes. Vascular/Lymphatic: Mild aortoiliac atherosclerosis. Hazy mesenteric fat with tiny mesenteric lymph nodes possibly representing stigmata of sclerosing mesenteritis. Reproductive: Prostate is unremarkable. Other: Trace fluid in the abdomen and  pelvis likely from peritoneal dialysate. Right pelvic location of peritoneal dialysis catheter. Musculoskeletal: No acute or significant osseous findings. IMPRESSION: Chest CT: 1. Stable extraperitoneal soft tissue nodules likely neurogenic in origin. 2. No acute cardiopulmonary disease. CT abdomen and pelvis 1. Uncomplicated cholelithiasis. 2. Stable mild splenomegaly. 3. Smaller right adrenal hemorrhage since recent comparison now measuring 4.5 x 4.7 cm. 4. Hazy mesenteric fat with tiny nodules likely representing small nodes compatible with sclerosing mesenteritis. This is stable as well. Electronically Signed   By: Ashley Royalty M.D.   On: 09/23/2017 20:44   Dg Chest 2 View  Result Date: 09/23/2017 CLINICAL DATA:  Chest pain EXAM: CHEST - 2 VIEW COMPARISON:  Chest CT 08/06/2017 FINDINGS: Right basilar atelectasis. No other consolidation. No pleural effusion or pneumothorax. No pulmonary edema. Normal cardiomediastinal contours. IMPRESSION: Right basilar atelectasis. Electronically Signed   By: Ulyses Jarred M.D.   On: 09/23/2017 18:45   Ct Chest Wo Contrast  Result Date: 09/23/2017 CLINICAL DATA:  Back pain and pressure radiating to the nipple per patient. Patient states a month ago that he had an adrenal hemorrhage and symptoms are similar. EXAM: CT CHEST, ABDOMEN AND PELVIS WITHOUT CONTRAST TECHNIQUE: Multidetector CT imaging of the chest, abdomen and pelvis was performed following the standard protocol without IV contrast. COMPARISON:  Chest CT 08/06/2017, CT abdomen and pelvis 08/08/2017 FINDINGS: CT CHEST FINDINGS Cardiovascular: Normal size heart without pericardial effusion. Minimal coronary arteriosclerosis along the LAD. Nonaneurysmal thoracic aorta without atherosclerotic calcifications. Unremarkable noncontrast appearance  of the pulmonary vasculature. Mediastinum/Nodes: Stable appearance of the mediastinal and hilar lymph nodes, the largest measuring 11 mm short axis in the right lower  paratracheal station. No definite hilar adenopathy given limitations of this noncontrast study. Esophagus is unremarkable. Patent trachea and mainstem bronchi. No thyroid mass. Lungs/Pleura: Chronic bibasilar atelectasis and scarring. Multiple extrapleural soft tissue densities/lesions are redemonstrated in the posterior hemithoraces bilaterally, the largest is posteriorly on the right measuring 1.3 x 2.6 cm versus 1.5 x 2.7 cm previously, stable in appearance possibly neurogenic in etiology. Clearing of previous small effusions. No pneumothorax. Musculoskeletal: Degenerative change is noted along the dorsal spine without worrisome lytic osseous sclerotic lesions. CT ABDOMEN PELVIS FINDINGS Hepatobiliary: Mild hepatic steatosis without focal liver lesions. Uncomplicated cholelithiasis. Pancreas: Unremarkable. No pancreatic ductal dilatation or surrounding inflammatory changes. Spleen: Stable mild splenomegaly without focal mass. The spleen measures 15.4 x 4.9 x 13.8 cm (volume = 550 cm^3). Adrenals/Urinary Tract: Stigmata of prior right adrenal hemorrhage now measuring 4.5 x 4.7 cm versus 6.3 x 6 cm. No new focus of hemorrhage is identified. Embolization coils are noted about the left renal pelvis and hilum as before. Unchanged exophytic interpolar right renal cyst measuring 3.5 cm. Scarring noted about the left kidney related to prior hematoma, unchanged in appearance with exophytic fatty focus seen off the upper. No renal or ureteral calculi. No hydronephrosis. The bladder is unremarkable for degree of distention. Stomach/Bowel: Stomach is within normal limits. No findings of acute appendicitis. Scattered left-sided colonic diverticulosis. No evidence of bowel wall thickening, distention, or inflammatory changes. Vascular/Lymphatic: Mild aortoiliac atherosclerosis. Hazy mesenteric fat with tiny mesenteric lymph nodes possibly representing stigmata of sclerosing mesenteritis. Reproductive: Prostate is unremarkable.  Other: Trace fluid in the abdomen and pelvis likely from peritoneal dialysate. Right pelvic location of peritoneal dialysis catheter. Musculoskeletal: No acute or significant osseous findings. IMPRESSION: Chest CT: 1. Stable extraperitoneal soft tissue nodules likely neurogenic in origin. 2. No acute cardiopulmonary disease. CT abdomen and pelvis 1. Uncomplicated cholelithiasis. 2. Stable mild splenomegaly. 3. Smaller right adrenal hemorrhage since recent comparison now measuring 4.5 x 4.7 cm. 4. Hazy mesenteric fat with tiny nodules likely representing small nodes compatible with sclerosing mesenteritis. This is stable as well. Electronically Signed   By: Ashley Royalty M.D.   On: 09/23/2017 20:44   Mr Thoracic Spine Wo Contrast  Result Date: 09/24/2017 CLINICAL DATA:  Known history of amyloidosis, and chronic kidney disease, presents to the emergency room for intractable severe RIGHT upper back pain. EXAM: MRI THORACIC SPINE WITHOUT CONTRAST TECHNIQUE: Multiplanar, multisequence MR imaging of the thoracic spine was performed. No intravenous contrast was administered. COMPARISON:  CT chest and abdomen 09/23/2017 FINDINGS: Alignment:  Anatomic Vertebrae: Low signal intensity bone marrow on T1 weighted images, likely related to chronic renal disease. No areas concerning for fracture, bone lesion, or diskitis. Cord:  Normal signal and morphology. Paraspinal and other soft tissues: Paraspinous ovoid soft tissue densities along the posterior ribs consistent with neurogenic lesions, stable from prior imaging. Chronic RIGHT adrenal hemorrhage. Fatty upper pole lesion of the LEFT kidney. Disc levels: The individual disc spaces are examined as follows: T1-2:  Unremarkable. T2-3:  Shallow protrusion.  No impingement. T3-4: Shallow central and leftward protrusion. Effacement anterior subarachnoid space. No impingement. T4-5:  Focal leftward protrusion.  No definite impingement. T5-6: Central and leftward protrusion. Slight  cord displacement, without stenosis or abnormal signal. T6-7:  Unremarkable. T7-8: Focal rightward protrusion. No foraminal narrowing or cord displacement. T8-9:  Unremarkable. T9-10:  Unremarkable. T10-11:  Broad-based protrusion extends to both the RIGHT and LEFT, and effaces the anterior subarachnoid space. No foraminal narrowing or stenosis. T11-12:  Unremarkable. T12-L1: Unremarkable. IMPRESSION: Multilevel thoracic spondylosis. Focal RIGHT-sided protrusions at T7-8 and T10-11, not clearly compressive. Good general agreement with recent CT. No thoracic spine vertebral body fracture, or subluxation. Electronically Signed   By: Staci Righter M.D.   On: 09/24/2017 10:42   Nm Pulmonary Vent And Perf (v/q Scan)  Result Date: 09/24/2017 CLINICAL DATA:  Shortness of breath.  Elevated D-dimer EXAM: NUCLEAR MEDICINE VENTILATION - PERFUSION LUNG SCAN VIEWS: Anterior, posterior, left lateral, right lateral, RPO, LPO, RAO, LAO-ventilation and perfusion RADIOPHARMACEUTICALS:  32.856 mCi of Tc-67m DTPA aerosol inhalation and 4.01 mCi Tc66m-MAA IV COMPARISON:  Chest CT September 23, 2017 FINDINGS: Ventilation: Radiotracer uptake on the ventilation study is homogeneous and symmetric bilaterally. No appreciable ventilation defects. Perfusion: Radiotracer uptake on the perfusion study is homogeneous and symmetric bilaterally. No appreciable perfusion defects. IMPRESSION: No appreciable ventilation or perfusion defects. This study constitutes a very low probability of pulmonary embolus. Electronically Signed   By: Lowella Grip III M.D.   On: 09/24/2017 11:44   US Venous Img Lower Bilateral  Result Date: 09/24/2017 CLINICAL DATA:  52 year old male with a history of chest pain EXAM: BILATERAL LOWER EXTREMITY VENOUS DOPPLER ULTRASOUND TECHNIQUE: Gray-scale sonography with graded compression, as well as color Doppler and duplex ultrasound were performed to evaluate the lower extremity deep venous systems from the level of the  common femoral vein and including the common femoral, femoral, profunda femoral, popliteal and calf veins including the posterior tibial, peroneal and gastrocnemius veins when visible. The superficial great saphenous vein was also interrogated. Spectral Doppler was utilized to evaluate flow at rest and with distal augmentation maneuvers in the common femoral, femoral and popliteal veins. COMPARISON:  None. FINDINGS: RIGHT LOWER EXTREMITY Common Femoral Vein: No evidence of thrombus. Normal compressibility, respiratory phasicity and response to augmentation. Saphenofemoral Junction: No evidence of thrombus. Normal compressibility and flow on color Doppler imaging. Profunda Femoral Vein: No evidence of thrombus. Normal compressibility and flow on color Doppler imaging. Femoral Vein: No evidence of thrombus. Normal compressibility, respiratory phasicity and response to augmentation. Popliteal Vein: No evidence of thrombus. Normal compressibility, respiratory phasicity and response to augmentation. Calf Veins: No evidence of thrombus. Normal compressibility and flow on color Doppler imaging. Superficial Great Saphenous Vein: No evidence of thrombus. Normal compressibility and flow on color Doppler imaging. Other Findings:  None. LEFT LOWER EXTREMITY Common Femoral Vein: No evidence of thrombus. Normal compressibility, respiratory phasicity and response to augmentation. Saphenofemoral Junction: No evidence of thrombus. Normal compressibility and flow on color Doppler imaging. Profunda Femoral Vein: No evidence of thrombus. Normal compressibility and flow on color Doppler imaging. Femoral Vein: No evidence of thrombus. Normal compressibility, respiratory phasicity and response to augmentation. Popliteal Vein: No evidence of thrombus. Normal compressibility, respiratory phasicity and response to augmentation. Calf Veins: No evidence of thrombus. Normal compressibility and flow on color Doppler imaging. Superficial Great  Saphenous Vein: No evidence of thrombus. Normal compressibility and flow on color Doppler imaging. Other Findings:  None. IMPRESSION: Sonographic survey of the bilateral lower extremities negative for DVT Signed, Dulcy Fanny. Earleen Newport, DO [facility] Electronically Signed   By: Corrie Mckusick D.O.   On: 09/24/2017 07:44   Dg Abd 2 Views  Result Date: 09/24/2017 CLINICAL DATA:  52 year old male with right adrenal hemorrhage. Recurrent severe right upper back pain. Prior left renal embolization. EXAM:  ABDOMEN - 2 VIEW COMPARISON:  CT Abdomen and Pelvis 09/23/2017 and earlier. FINDINGS: Upright and supine views. No pneumoperitoneum identified. Peritoneal dialysis catheter remains in place. Sequelae of left renal region embolization. Non obstructed bowel gas pattern. Stable abdominal and pelvic visceral contours. Mild lung base atelectasis. No acute osseous abnormality identified. Chronic dystrophic ossification about the right ischium. IMPRESSION: 1.  Normal bowel gas pattern, no free air. 2. Stable radiographic appearance of the abdomen and pelvis. Mild lung base atelectasis. Electronically Signed   By: Genevie Ann M.D.   On: 09/24/2017 08:07   Dg Abd 2 Views  Result Date: 09/23/2017 CLINICAL DATA:  Back pain and pressure radiating to the nipple. End-stage renal disease with peritoneal dialysis. Patient's most recent dialysis was last evening. EXAM: ABDOMEN - 2 VIEW COMPARISON:  CT 08/08/2017 FINDINGS: The tip of a peritoneal dialysis catheter projects over the right hemipelvis. A moderate amount of stool is seen within the colon, more so on the right. No bowel obstruction is visualized. Embolization coils are seen projecting over the left upper quadrant. No free air. No radiopaque calculi. Stable phleboliths are seen bilaterally within the pelvis. No acute osseous appearing abnormality. IMPRESSION: 1. Increased colonic stool burden on the right. No free air nor  bowel obstruction. 2. Peritoneal dialysis catheter in the right in the pelvis. Electronically Signed   By: Ashley Royalty M.D.   On: 09/23/2017 19:39     Medications:    . atorvastatin  20 mg Oral Daily  . cycloSPORINE  1 drop Both Eyes Daily  . docusate sodium  200 mg Oral BID  . ezetimibe  10 mg Oral Daily  . folic acid  5 mg Oral Daily  . gentamicin cream  1 application Topical QHS  . losartan  50 mg Oral Daily  . pantoprazole  40 mg Oral Daily  . patiromer  1 packet Oral Daily   acetaminophen **OR** acetaminophen, albuterol, cyclobenzaprine, hydrALAZINE, HYDROmorphone, ondansetron **OR** ondansetron (ZOFRAN) IV, polyethylene glycol, polyvinyl alcohol, traMADol  Assessment/ Plan:  Mr. Joseph [name] is a 52 y.o. white male with end stage renal disease on peritoneal dialysis, amyloidosis, hypertension, hyperlipidemia, peptic ulcer disease, right adrenal hemorrhage.   [facility] PD 4 cycles 2 liter fills 6 hours.   1. End stage renal disease: on peritoneal dialysis. Resume treatment for tonight  2. Hypertension: blood pressure at goal - losartan  3. Anemia of chronic kidney disease: outpatient EPO. Hemoglobin 12.3  4. Secondary Hyperparathyroidism: outpatient PTH 336, phos 3.6 calcium 9 - not currently on binders.    LOS: 1 Harue Pribble 4/1/20193:30 PM

## 2017-09-24 NOTE — Progress Notes (Signed)
[place] [facility] [place] NAME: Joseph [name]    MR#:  [id number]  DATE OF BIRTH:  1966-04-15  SUBJECTIVE:  CHIEF COMPLAINT:   Chief Complaint  Patient presents with  . Back Pain  Patient without complaint, patient denies having a bowel movement-a bowel movement has been documented, VQ scan negative for any acute process, abdominal x-rays negative for any acute process, lower extremity Dopplers negative, patient continues to complain of constipation-start lactulose, MRI of the thoracic spine was unimpressive  REVIEW OF SYSTEMS:  CONSTITUTIONAL: No fever, fatigue or weakness.  EYES: No blurred or double vision.  EARS, NOSE, AND THROAT: No tinnitus or ear pain.  RESPIRATORY: No cough, shortness of breath, wheezing or hemoptysis.  CARDIOVASCULAR: No chest pain, orthopnea, edema.  GASTROINTESTINAL: No nausea, vomiting, diarrhea or abdominal pain.  GENITOURINARY: No dysuria, hematuria.  ENDOCRINE: No polyuria, nocturia,  HEMATOLOGY: No anemia, easy bruising or bleeding SKIN: No rash or lesion. MUSCULOSKELETAL: No joint pain or arthritis.   NEUROLOGIC: No tingling, numbness, weakness.  PSYCHIATRY: No anxiety or depression.   ROS  DRUG ALLERGIES:   Allergies  Allergen Reactions  . Ciprofloxacin Hcl Swelling    High fever  . Doxycycline Hyclate Swelling  . Phenol-Glycerin Swelling    VITALS:  Blood pressure 133/88, pulse 71, temperature 98.9 F (37.2 C), resp. rate 16, height 5\' 11"  (1.803 m), weight 85.8 kg (189 lb 3.2 oz), SpO2 97 %.  PHYSICAL EXAMINATION:  GENERAL:  52 y.o.-year-old patient lying in the bed with no acute distress.  EYES: Pupils equal, round, reactive to light and accommodation. No scleral icterus. Extraocular muscles intact.  HEENT: Head atraumatic, normocephalic. Oropharynx and nasopharynx clear.  NECK:  Supple, no jugular venous distention. No thyroid enlargement, no tenderness.  LUNGS: Normal breath sounds  bilaterally, no wheezing, rales,rhonchi or crepitation. No use of accessory muscles of respiration.  CARDIOVASCULAR: S1, S2 normal. No murmurs, rubs, or gallops.  ABDOMEN: Soft, nontender, nondistended. Bowel sounds present. No organomegaly or mass.  EXTREMITIES: No pedal edema, cyanosis, or clubbing.  NEUROLOGIC: Cranial nerves II through XII are intact. Muscle strength 5/5 in all extremities. Sensation intact. Gait not checked.  PSYCHIATRIC: The patient is alert and oriented x 3.  SKIN: No obvious rash, lesion, or ulcer.   Physical Exam LABORATORY PANEL:   CBC Recent Labs  Lab 09/24/17 0035  WBC 12.2*  HGB 12.3*  HCT 38.6*  PLT 269   ------------------------------------------------------------------------------------------------------------------  Chemistries  Recent Labs  Lab 09/23/17 1842 09/24/17 0035  NA 141 143  K 4.2 4.3  CL 107 109  CO2 24 26  GLUCOSE 139* 116*  BUN 40* 40*  CREATININE 4.37* 4.23*  CALCIUM 8.3* 8.3*  AST 33  --   ALT 32  --   ALKPHOS 164*  --   BILITOT 0.5  --    ------------------------------------------------------------------------------------------------------------------  Cardiac Enzymes Recent Labs  Lab 09/23/17 1844  TROPONINI <0.03   ------------------------------------------------------------------------------------------------------------------  RADIOLOGY:  Ct Abdomen Pelvis Wo Contrast  Result Date: 09/23/2017 CLINICAL DATA:  Back pain and pressure radiating to the nipple per patient. Patient states a month ago that he had an adrenal hemorrhage and symptoms are similar. EXAM: CT CHEST, ABDOMEN AND PELVIS WITHOUT CONTRAST TECHNIQUE: Multidetector CT imaging of the chest, abdomen and pelvis was performed following the standard protocol without IV contrast. COMPARISON:  Chest CT 08/06/2017, CT abdomen and pelvis 08/08/2017 FINDINGS: CT CHEST FINDINGS Cardiovascular: Normal size heart without pericardial effusion. Minimal coronary  arteriosclerosis along  the LAD. Nonaneurysmal thoracic aorta without atherosclerotic calcifications. Unremarkable noncontrast appearance of the pulmonary vasculature. Mediastinum/Nodes: Stable appearance of the mediastinal and hilar lymph nodes, the largest measuring 11 mm short axis in the right lower paratracheal station. No definite hilar adenopathy given limitations of this noncontrast study. Esophagus is unremarkable. Patent trachea and mainstem bronchi. No thyroid mass. Lungs/Pleura: Chronic bibasilar atelectasis and scarring. Multiple extrapleural soft tissue densities/lesions are redemonstrated in the posterior hemithoraces bilaterally, the largest is posteriorly on the right measuring 1.3 x 2.6 cm versus 1.5 x 2.7 cm previously, stable in appearance possibly neurogenic in etiology. Clearing of previous small effusions. No pneumothorax. Musculoskeletal: Degenerative change is noted along the dorsal spine without worrisome lytic osseous sclerotic lesions. CT ABDOMEN PELVIS FINDINGS Hepatobiliary: Mild hepatic steatosis without focal liver lesions. Uncomplicated cholelithiasis. Pancreas: Unremarkable. No pancreatic ductal dilatation or surrounding inflammatory changes. Spleen: Stable mild splenomegaly without focal mass. The spleen measures 15.4 x 4.9 x 13.8 cm (volume = 550 cm^3). Adrenals/Urinary Tract: Stigmata of prior right adrenal hemorrhage now measuring 4.5 x 4.7 cm versus 6.3 x 6 cm. No new focus of hemorrhage is identified. Embolization coils are noted about the left renal pelvis and hilum as before. Unchanged exophytic interpolar right renal cyst measuring 3.5 cm. Scarring noted about the left kidney related to prior hematoma, unchanged in appearance with exophytic fatty focus seen off the upper. No renal or ureteral calculi. No hydronephrosis. The bladder is unremarkable for degree of distention. Stomach/Bowel: Stomach is within normal limits. No findings of acute appendicitis. Scattered left-sided  colonic diverticulosis. No evidence of bowel wall thickening, distention, or inflammatory changes. Vascular/Lymphatic: Mild aortoiliac atherosclerosis. Hazy mesenteric fat with tiny mesenteric lymph nodes possibly representing stigmata of sclerosing mesenteritis. Reproductive: Prostate is unremarkable. Other: Trace fluid in the abdomen and pelvis likely from peritoneal dialysate. Right pelvic location of peritoneal dialysis catheter. Musculoskeletal: No acute or significant osseous findings. IMPRESSION: Chest CT: 1. Stable extraperitoneal soft tissue nodules likely neurogenic in origin. 2. No acute cardiopulmonary disease. CT abdomen and pelvis 1. Uncomplicated cholelithiasis. 2. Stable mild splenomegaly. 3. Smaller right adrenal hemorrhage since recent comparison now measuring 4.5 x 4.7 cm. 4. Hazy mesenteric fat with tiny nodules likely representing small nodes compatible with sclerosing mesenteritis. This is stable as well. Electronically Signed   By: Ashley Royalty M.D.   On: 09/23/2017 20:44   Dg Chest 2 View  Result Date: 09/23/2017 CLINICAL DATA:  Chest pain EXAM: CHEST - 2 VIEW COMPARISON:  Chest CT 08/06/2017 FINDINGS: Right basilar atelectasis. No other consolidation. No pleural effusion or pneumothorax. No pulmonary edema. Normal cardiomediastinal contours. IMPRESSION: Right basilar atelectasis. Electronically Signed   By: Ulyses Jarred M.D.   On: 09/23/2017 18:45   Ct Chest Wo Contrast  Result Date: 09/23/2017 CLINICAL DATA:  Back pain and pressure radiating to the nipple per patient. Patient states a month ago that he had an adrenal hemorrhage and symptoms are similar. EXAM: CT CHEST, ABDOMEN AND PELVIS WITHOUT CONTRAST TECHNIQUE: Multidetector CT imaging of the chest, abdomen and pelvis was performed following the standard protocol without IV contrast. COMPARISON:  Chest CT 08/06/2017, CT abdomen and pelvis 08/08/2017 FINDINGS: CT CHEST FINDINGS Cardiovascular: Normal size heart without  pericardial effusion. Minimal coronary arteriosclerosis along the LAD. Nonaneurysmal thoracic aorta without atherosclerotic calcifications. Unremarkable noncontrast appearance of the pulmonary vasculature. Mediastinum/Nodes: Stable appearance of the mediastinal and hilar lymph nodes, the largest measuring 11 mm short axis in the right lower paratracheal station. No definite hilar adenopathy given limitations of  this noncontrast study. Esophagus is unremarkable. Patent trachea and mainstem bronchi. No thyroid mass. Lungs/Pleura: Chronic bibasilar atelectasis and scarring. Multiple extrapleural soft tissue densities/lesions are redemonstrated in the posterior hemithoraces bilaterally, the largest is posteriorly on the right measuring 1.3 x 2.6 cm versus 1.5 x 2.7 cm previously, stable in appearance possibly neurogenic in etiology. Clearing of previous small effusions. No pneumothorax. Musculoskeletal: Degenerative change is noted along the dorsal spine without worrisome lytic osseous sclerotic lesions. CT ABDOMEN PELVIS FINDINGS Hepatobiliary: Mild hepatic steatosis without focal liver lesions. Uncomplicated cholelithiasis. Pancreas: Unremarkable. No pancreatic ductal dilatation or surrounding inflammatory changes. Spleen: Stable mild splenomegaly without focal mass. The spleen measures 15.4 x 4.9 x 13.8 cm (volume = 550 cm^3). Adrenals/Urinary Tract: Stigmata of prior right adrenal hemorrhage now measuring 4.5 x 4.7 cm versus 6.3 x 6 cm. No new focus of hemorrhage is identified. Embolization coils are noted about the left renal pelvis and hilum as before. Unchanged exophytic interpolar right renal cyst measuring 3.5 cm. Scarring noted about the left kidney related to prior hematoma, unchanged in appearance with exophytic fatty focus seen off the upper. No renal or ureteral calculi. No hydronephrosis. The bladder is unremarkable for degree of distention. Stomach/Bowel: Stomach is within normal limits. No findings of  acute appendicitis. Scattered left-sided colonic diverticulosis. No evidence of bowel wall thickening, distention, or inflammatory changes. Vascular/Lymphatic: Mild aortoiliac atherosclerosis. Hazy mesenteric fat with tiny mesenteric lymph nodes possibly representing stigmata of sclerosing mesenteritis. Reproductive: Prostate is unremarkable. Other: Trace fluid in the abdomen and pelvis likely from peritoneal dialysate. Right pelvic location of peritoneal dialysis catheter. Musculoskeletal: No acute or significant osseous findings. IMPRESSION: Chest CT: 1. Stable extraperitoneal soft tissue nodules likely neurogenic in origin. 2. No acute cardiopulmonary disease. CT abdomen and pelvis 1. Uncomplicated cholelithiasis. 2. Stable mild splenomegaly. 3. Smaller right adrenal hemorrhage since recent comparison now measuring 4.5 x 4.7 cm. 4. Hazy mesenteric fat with tiny nodules likely representing small nodes compatible with sclerosing mesenteritis. This is stable as well. Electronically Signed   By: Ashley Royalty M.D.   On: 09/23/2017 20:44   Mr Thoracic Spine Wo Contrast  Result Date: 09/24/2017 CLINICAL DATA:  Known history of amyloidosis, and chronic kidney disease, presents to the emergency room for intractable severe RIGHT upper back pain. EXAM: MRI THORACIC SPINE WITHOUT CONTRAST TECHNIQUE: Multiplanar, multisequence MR imaging of the thoracic spine was performed. No intravenous contrast was administered. COMPARISON:  CT chest and abdomen 09/23/2017 FINDINGS: Alignment:  Anatomic Vertebrae: Low signal intensity bone marrow on T1 weighted images, likely related to chronic renal disease. No areas concerning for fracture, bone lesion, or diskitis. Cord:  Normal signal and morphology. Paraspinal and other soft tissues: Paraspinous ovoid soft tissue densities along the posterior ribs consistent with neurogenic lesions, stable from prior imaging. Chronic RIGHT adrenal hemorrhage. Fatty upper pole lesion of the LEFT  kidney. Disc levels: The individual disc spaces are examined as follows: T1-2:  Unremarkable. T2-3:  Shallow protrusion.  No impingement. T3-4: Shallow central and leftward protrusion. Effacement anterior subarachnoid space. No impingement. T4-5:  Focal leftward protrusion.  No definite impingement. T5-6: Central and leftward protrusion. Slight cord displacement, without stenosis or abnormal signal. T6-7:  Unremarkable. T7-8: Focal rightward protrusion. No foraminal narrowing or cord displacement. T8-9:  Unremarkable. T9-10:  Unremarkable. T10-11: Broad-based protrusion extends to both the RIGHT and LEFT, and effaces the anterior subarachnoid space. No foraminal narrowing or stenosis. T11-12:  Unremarkable. T12-L1: Unremarkable. IMPRESSION: Multilevel thoracic spondylosis. Focal RIGHT-sided protrusions at T7-8 and  T10-11, not clearly compressive. Good general agreement with recent CT. No thoracic spine vertebral body fracture, or subluxation. Electronically Signed   By: Staci Righter M.D.   On: 09/24/2017 10:42   Nm Pulmonary Vent And Perf (v/q Scan)  Result Date: 09/24/2017 CLINICAL DATA:  Shortness of breath.  Elevated D-dimer EXAM: NUCLEAR MEDICINE VENTILATION - PERFUSION LUNG SCAN VIEWS: Anterior, posterior, left lateral, right lateral, RPO, LPO, RAO, LAO-ventilation and perfusion RADIOPHARMACEUTICALS:  32.856 mCi of Tc-37m DTPA aerosol inhalation and 4.01 mCi Tc37m-MAA IV COMPARISON:  Chest CT September 23, 2017 FINDINGS: Ventilation: Radiotracer uptake on the ventilation study is homogeneous and symmetric bilaterally. No appreciable ventilation defects. Perfusion: Radiotracer uptake on the perfusion study is homogeneous and symmetric bilaterally. No appreciable perfusion defects. IMPRESSION: No appreciable ventilation or perfusion defects. This study constitutes a very low probability of pulmonary embolus. Electronically Signed   By: Lowella Grip III M.D.   On: 09/24/2017 11:44   US Venous Img Lower  Bilateral  Result Date: 09/24/2017 CLINICAL DATA:  52 year old male with a history of chest pain EXAM: BILATERAL LOWER EXTREMITY VENOUS DOPPLER ULTRASOUND TECHNIQUE: Gray-scale sonography with graded compression, as well as color Doppler and duplex ultrasound were performed to evaluate the lower extremity deep venous systems from the level of the common femoral vein and including the common femoral, femoral, profunda femoral, popliteal and calf veins including the posterior tibial, peroneal and gastrocnemius veins when visible. The superficial great saphenous vein was also interrogated. Spectral Doppler was utilized to evaluate flow at rest and with distal augmentation maneuvers in the common femoral, femoral and popliteal veins. COMPARISON:  None. FINDINGS: RIGHT LOWER EXTREMITY Common Femoral Vein: No evidence of thrombus. Normal compressibility, respiratory phasicity and response to augmentation. Saphenofemoral Junction: No evidence of thrombus. Normal compressibility and flow on color Doppler imaging. Profunda Femoral Vein: No evidence of thrombus. Normal compressibility and flow on color Doppler imaging. Femoral Vein: No evidence of thrombus. Normal compressibility, respiratory phasicity and response to augmentation. Popliteal Vein: No evidence of thrombus. Normal compressibility, respiratory phasicity and response to augmentation. Calf Veins: No evidence of thrombus. Normal compressibility and flow on color Doppler imaging. Superficial Great Saphenous Vein: No evidence of thrombus. Normal compressibility and flow on color Doppler imaging. Other Findings:  None. LEFT LOWER EXTREMITY Common Femoral Vein: No evidence of thrombus. Normal compressibility, respiratory phasicity and response to augmentation. Saphenofemoral Junction: No evidence of thrombus. Normal compressibility and flow on color Doppler imaging. Profunda Femoral Vein: No evidence of thrombus. Normal compressibility and flow on color Doppler  imaging. Femoral Vein: No evidence of thrombus. Normal compressibility, respiratory phasicity and response to augmentation. Popliteal Vein: No evidence of thrombus. Normal compressibility, respiratory phasicity and response to augmentation. Calf Veins: No evidence of thrombus. Normal compressibility and flow on color Doppler imaging. Superficial Great Saphenous Vein: No evidence of thrombus. Normal compressibility and flow on color Doppler imaging. Other Findings:  None. IMPRESSION: Sonographic survey of the bilateral lower extremities negative for DVT Signed, Dulcy Fanny. Earleen Newport, DO [facility] Electronically Signed   By: Corrie Mckusick D.O.   On: 09/24/2017 07:44   Dg Abd 2 Views  Result Date: 09/24/2017 CLINICAL DATA:  52 year old male with right adrenal hemorrhage. Recurrent severe right upper back pain. Prior left renal embolization. EXAM: ABDOMEN - 2 VIEW COMPARISON:  CT Abdomen and Pelvis 09/23/2017 and earlier. FINDINGS: Upright and supine views. No pneumoperitoneum identified. Peritoneal dialysis catheter remains in place. Sequelae of left renal region embolization. Non obstructed  bowel gas pattern. Stable abdominal and pelvic visceral contours. Mild lung base atelectasis. No acute osseous abnormality identified. Chronic dystrophic ossification about the right ischium. IMPRESSION: 1.  Normal bowel gas pattern, no free air. 2. Stable radiographic appearance of the abdomen and pelvis. Mild lung base atelectasis. Electronically Signed   By: Genevie Ann M.D.   On: 09/24/2017 08:07   Dg Abd 2 Views  Result Date: 09/23/2017 CLINICAL DATA:  Back pain and pressure radiating to the nipple. End-stage renal disease with peritoneal dialysis. Patient's most recent dialysis was last evening. EXAM: ABDOMEN - 2 VIEW COMPARISON:  CT 08/08/2017 FINDINGS: The tip of a peritoneal dialysis catheter projects over the right hemipelvis. A moderate amount of stool is seen  within the colon, more so on the right. No bowel obstruction is visualized. Embolization coils are seen projecting over the left upper quadrant. No free air. No radiopaque calculi. Stable phleboliths are seen bilaterally within the pelvis. No acute osseous appearing abnormality. IMPRESSION: 1. Increased colonic stool burden on the right. No free air nor bowel obstruction. 2. Peritoneal dialysis catheter in the right in the pelvis. Electronically Signed   By: Ashley Royalty M.D.   On: 09/23/2017 19:39    ASSESSMENT AND PLAN:  1 acute Intractable upper back pain Etiology unknown, though suspected probable muscular spasms VQ scan low probability for PE, acute abdominal series unimpressive, MRI of thoracic spine was largely unimpressive, CT abdomen noted for decreasing right adrenal hematoma/increased spleen size, consult physical therapy to evaluate/treat, ambulate in halls  2.  Amyloidosis, chronic Stable Continue conservative medical management/monitoring   3.  End-stage renal disease, chronic Nephrology consulted for hemodialysis needs  4.    Chronic benign essential hypertension  Stable  Continue home regiment   5.  Acute constipation Resolved Colace twice daily  Disposition to home tentatively planned for on tomorrow barring any complications  All the records are reviewed and case discussed with Care Management/Social Workerr. Management plans discussed with the patient, family and they are in agreement.  CODE STATUS: full  TOTAL TIME TAKING CARE OF THIS PATIENT: 35 minutes.     POSSIBLE D/C IN 1-2 DAYS, DEPENDING ON CLINICAL CONDITION.   Avel Peace Salary M.D on 09/24/2017   Between 7am to 6pm - Pager - [phone]  After 6pm go to [email or web address] - password [email or web address] [facility]  Office  [phone]  CC: Primary care physician; Alycia Rossetti, MD  Note: This dictation was prepared with Dragon dictation along with smaller phrase technology. Any  transcriptional errors that result from this process are unintentional.

## 2017-09-24 NOTE — Progress Notes (Signed)
Pre [facility] Assessment    09/24/17 2030  Neurological  Level of Consciousness Alert  Orientation Level Oriented X4  Respiratory  Respiratory Pattern Regular  Chest Assessment Chest expansion symmetrical  Bilateral Breath Sounds Clear;Diminished  Cough None  Cardiac  Pulse Regular  Heart Sounds S1, S2  ECG Monitor Yes  Vascular  R Radial Pulse +2  L Radial Pulse +2  Edema Generalized  Peritoneal Catheter Left lower abdomen Continuous cycling  Placement Date: 07/28/15   Catheter Location: Left lower abdomen  Dialysis Type: Continuous cycling  Site Assessment Clean;Dry;Intact  Drainage Description None  Catheter status Accessed  Dressing Gauze/Drain sponge  Dressing Status Clean;Dry;Intact  Dressing Intervention New dressing  Psychosocial  Psychosocial (WDL) WDL

## 2017-09-25 LAB — GLUCOSE, CAPILLARY: Glucose-Capillary: 101 mg/dL — ABNORMAL HIGH (ref 65–99)

## 2017-09-25 NOTE — Discharge Summary (Signed)
Joseph [name] at [place] NAME: Joseph [name]    MR#:  [id number]  DATE OF BIRTH:  11/29/1965  DATE OF ADMISSION:  09/23/2017 ADMITTING PHYSICIAN: Joseph Jo, MD  DATE OF DISCHARGE: No discharge date for patient encounter.  PRIMARY CARE PHYSICIAN: Brewster, Modena Nunnery, MD    ADMISSION DIAGNOSIS:  Adrenal hemorrhage ([place]) [E27.49] Chest pain, unspecified type [R07.9]  DISCHARGE DIAGNOSIS:  Active Problems:   Intractable back pain   SECONDARY DIAGNOSIS:   Past Medical History:  Diagnosis Date  . Amyloidosis ([place])   . Blood clot in abdominal vein    happened around age 89 due to traumatic injury  . Blood dyscrasia    amyloidosis  . Central serous chorioretinopathy   . Dry eye   . GERD (gastroesophageal reflux disease)   . Heart murmur    never followed up with this, no problems  . Hyperlipidemia   . Hypertension    improved since starting dialysis  . Pneumonia    as child  . Renal insufficiency     HOSPITAL COURSE:  1 acuteIntractable upper back pain Resolved Etiology unknown, though suspected probable muscular spasms VQ scan low probability for PE, acute abdominal series unimpressive, MRI of thoracic spine was largely unimpressive, CT abdomen noted for decreasing right adrenal hematoma/increased spleen size, to f/u with PCP in 2-3 days for re-evaluation/cotinued care  2.Amyloidosis, chronic Stable Continue conservative medical management/monitoring   3.End-stage renal disease, chronic Nephrology did follow while in house for dialysis needs  4.  Chronic benign essential hypertension  Stable  Continued home regiment   5.  Acute constipation Resolved Provided Colace twice daily  DISCHARGE CONDITIONS:  On the day of discharge patient is afebrile, stable, tolerating diet, f/u with PCP in 2-3 days, for more details see chart  CONSULTS OBTAINED:  Treatment Team:  Joseph Dana, MD  DRUG ALLERGIES:    Allergies  Allergen Reactions  . Ciprofloxacin Hcl Swelling    High fever  . Doxycycline Hyclate Swelling  . Phenol-Glycerin Swelling    DISCHARGE MEDICATIONS:   Allergies as of 09/25/2017      Reactions   Ciprofloxacin Hcl Swelling   High fever   Doxycycline Hyclate Swelling   Phenol-glycerin Swelling      Medication List    TAKE these medications   albuterol 108 (90 Base) MCG/ACT inhaler Commonly known as:  PROVENTIL HFA;VENTOLIN HFA Inhale 2 puffs into the lungs every 6 (six) hours as needed for wheezing or shortness of breath.   ARTIFICIAL TEARS OP Apply 1 drop to eye as needed.   atorvastatin 20 MG tablet Commonly known as:  LIPITOR TAKE 1 TABLET BY MOUTH ONCE DAILY   cycloSPORINE 0.05 % ophthalmic emulsion Commonly known as:  RESTASIS Place 1 drop into both eyes daily.   EPOGEN IJ Inject as directed every 30 (thirty) days. Administered at Dialysis center.   EYLEA 2 MG/0.05ML Soln Generic drug:  Aflibercept 1 Dose by Intravitreal route every 30 (thirty) days.   ezetimibe 10 MG tablet Commonly known as:  ZETIA Take 1 tablet (10 mg total) by mouth daily.   folic acid 1 MG tablet Commonly known as:  FOLVITE Take 5 mg by mouth daily.   gentamicin cream 0.1 % Commonly known as:  GARAMYCIN Apply 1 application topically at bedtime.   HYDROmorphone 2 MG tablet Commonly known as:  DILAUDID Take 1 tablet (2 mg total) by mouth every 6 (six) hours as needed for severe pain.  losartan 100 MG tablet Commonly known as:  COZAAR Take 1 tablet (100 mg total) by mouth daily. What changed:  how much to take   ondansetron 4 MG disintegrating tablet Commonly known as:  ZOFRAN-ODT Take 1 tablet (4 mg total) by mouth every 8 (eight) hours as needed for nausea or vomiting.   OXYCONTIN 10 mg 12 hr tablet Generic drug:  oxyCODONE Take 5-10 mg by mouth every 12 (twelve) hours.   pantoprazole 40 MG tablet Commonly known as:  PROTONIX Take 1 tablet (40 mg total) by  mouth daily.   polyethylene glycol packet Commonly known as:  MIRALAX / GLYCOLAX Take 17 g by mouth daily. What changed:    when to take this  reasons to take this   VELTASSA 8.4 g packet Generic drug:  patiromer Take 1 packet by mouth daily.        DISCHARGE INSTRUCTIONS:  If you experience worsening of your admission symptoms, develop shortness of breath, life threatening emergency, suicidal or homicidal thoughts you must seek medical attention immediately by calling 911 or calling your MD immediately  if symptoms less severe.  You Must read complete instructions/literature along with all the possible adverse reactions/side effects for all the Medicines you take and that have been prescribed to you. Take any new Medicines after you have completely understood and accept all the possible adverse reactions/side effects.   Please note  You were cared for by a hospitalist during your hospital stay. If you have any questions about your discharge medications or the care you received while you were in the hospital after you are discharged, you can call the unit and asked to speak with the hospitalist on call if the hospitalist that took care of you is not available. Once you are discharged, your primary care physician will handle any further medical issues. Please note that NO REFILLS for any discharge medications will be authorized once you are discharged, as it is imperative that you return to your primary care physician (or establish a relationship with a primary care physician if you do not have one) for your aftercare needs so that they can reassess your need for medications and monitor your lab values.    Today   CHIEF COMPLAINT:   Chief Complaint  Patient presents with  . Back Pain    HISTORY OF PRESENT ILLNESS:  52 y.o. male with a known history of amyloidosis, hypertension and chronic kidney disease.  Patient was recently hospitalized, 1 month ago for spontaneous right  adrenal hemorrhage. He presented to emergency room this time for intractable, severe right upper back pain, that started at rest, early in the morning and progressively got worse despite taking OxyContin.  The pain is constant and dull. It starts just below the right shoulder blade and radiates to the anterior chest, following the rib line.  The pain gets worse with twisting the upper body.  Patient had an episode diaphoresis and nausea due to severe pain, but no vomiting, no diarrhea, no bleeding, no abdominal pain.  No fever/chills, no shortness of breath or cough.  The pain does not seem to get worse with deep breathing.  Patient denies any new medications.  No recent trauma, or increased physical activity.  He had a similar episode before approximately 1 month ago. Blood test done in the emergency room are notable for elevated creatinine level of 4.37, which is the baseline for this patient.  Troponin level is within normal limits.  D-dimer is elevated at 1400.  CT scan of the chest and the abdomen, reviewed by myself, is essentially unremarkable except for improving right adrenal gland hemorrhage. EKG is noted without any acute changes. Patient is admitted for further evaluation and treatment.  VITAL SIGNS:  Blood pressure 126/85, pulse 70, temperature 98.1 F (36.7 C), temperature source Oral, resp. rate 18, height 5\' 11"  (1.803 m), weight 85.8 kg (189 lb 3.2 oz), SpO2 95 %.  I/O:    Intake/Output Summary (Last 24 hours) at 09/25/2017 0956 Last data filed at 09/25/2017 0518 Gross per 24 hour  Intake 480 ml  Output 725 ml  Net -245 ml    PHYSICAL EXAMINATION:  GENERAL:  52 y.o.-year-old patient lying in the bed with no acute distress.  EYES: Pupils equal, round, reactive to light and accommodation. No scleral icterus. Extraocular muscles intact.  HEENT: Head atraumatic, normocephalic. Oropharynx and nasopharynx clear.  NECK:  Supple, no jugular venous distention. No thyroid enlargement, no  tenderness.  LUNGS: Normal breath sounds bilaterally, no wheezing, rales,rhonchi or crepitation. No use of accessory muscles of respiration.  CARDIOVASCULAR: S1, S2 normal. No murmurs, rubs, or gallops.  ABDOMEN: Soft, non-tender, non-distended. Bowel sounds present. No organomegaly or mass.  EXTREMITIES: No pedal edema, cyanosis, or clubbing.  NEUROLOGIC: Cranial nerves II through XII are intact. Muscle strength 5/5 in all extremities. Sensation intact. Gait not checked.  PSYCHIATRIC: The patient is alert and oriented x 3.  SKIN: No obvious rash, lesion, or ulcer.   DATA REVIEW:   CBC Recent Labs  Lab 09/24/17 0035  WBC 12.2*  HGB 12.3*  HCT 38.6*  PLT 269    Chemistries  Recent Labs  Lab 09/23/17 1842 09/24/17 0035  NA 141 143  K 4.2 4.3  CL 107 109  CO2 24 26  GLUCOSE 139* 116*  BUN 40* 40*  CREATININE 4.37* 4.23*  CALCIUM 8.3* 8.3*  AST 33  --   ALT 32  --   ALKPHOS 164*  --   BILITOT 0.5  --     Cardiac Enzymes Recent Labs  Lab 09/23/17 1844  TROPONINI <0.03    Microbiology Results  Results for orders placed or performed during the hospital encounter of 08/06/17  MRSA PCR Screening     Status: None   Collection Time: 08/06/17  3:47 PM  Result Value Ref Range Status   MRSA by PCR NEGATIVE NEGATIVE Final    Comment:        The GeneXpert MRSA Assay (FDA approved for NASAL specimens only), is one component of a comprehensive MRSA colonization surveillance program. It is not intended to diagnose MRSA infection nor to guide or monitor treatment for MRSA infections. Performed at [facility], [address] [place], [place] [address]     RADIOLOGY:  Ct Abdomen Pelvis Wo Contrast  Result Date: 09/23/2017 CLINICAL DATA:  Back pain and pressure radiating to the nipple per patient. Patient states a month ago that he had an adrenal hemorrhage and symptoms are similar. EXAM: CT CHEST, ABDOMEN AND PELVIS WITHOUT CONTRAST TECHNIQUE: Multidetector  CT imaging of the chest, abdomen and pelvis was performed following the standard protocol without IV contrast. COMPARISON:  Chest CT 08/06/2017, CT abdomen and pelvis 08/08/2017 FINDINGS: CT CHEST FINDINGS Cardiovascular: Normal size heart without pericardial effusion. Minimal coronary arteriosclerosis along the LAD. Nonaneurysmal thoracic aorta without atherosclerotic calcifications. Unremarkable noncontrast appearance of the pulmonary vasculature. Mediastinum/Nodes: Stable appearance of the mediastinal and hilar lymph nodes, the largest measuring 11 mm short axis in the right lower  paratracheal station. No definite hilar adenopathy given limitations of this noncontrast study. Esophagus is unremarkable. Patent trachea and mainstem bronchi. No thyroid mass. Lungs/Pleura: Chronic bibasilar atelectasis and scarring. Multiple extrapleural soft tissue densities/lesions are redemonstrated in the posterior hemithoraces bilaterally, the largest is posteriorly on the right measuring 1.3 x 2.6 cm versus 1.5 x 2.7 cm previously, stable in appearance possibly neurogenic in etiology. Clearing of previous small effusions. No pneumothorax. Musculoskeletal: Degenerative change is noted along the dorsal spine without worrisome lytic osseous sclerotic lesions. CT ABDOMEN PELVIS FINDINGS Hepatobiliary: Mild hepatic steatosis without focal liver lesions. Uncomplicated cholelithiasis. Pancreas: Unremarkable. No pancreatic ductal dilatation or surrounding inflammatory changes. Spleen: Stable mild splenomegaly without focal mass. The spleen measures 15.4 x 4.9 x 13.8 cm (volume = 550 cm^3). Adrenals/Urinary Tract: Stigmata of prior right adrenal hemorrhage now measuring 4.5 x 4.7 cm versus 6.3 x 6 cm. No new focus of hemorrhage is identified. Embolization coils are noted about the left renal pelvis and hilum as before. Unchanged exophytic interpolar right renal cyst measuring 3.5 cm. Scarring noted about the left kidney related to prior  hematoma, unchanged in appearance with exophytic fatty focus seen off the upper. No renal or ureteral calculi. No hydronephrosis. The bladder is unremarkable for degree of distention. Stomach/Bowel: Stomach is within normal limits. No findings of acute appendicitis. Scattered left-sided colonic diverticulosis. No evidence of bowel wall thickening, distention, or inflammatory changes. Vascular/Lymphatic: Mild aortoiliac atherosclerosis. Hazy mesenteric fat with tiny mesenteric lymph nodes possibly representing stigmata of sclerosing mesenteritis. Reproductive: Prostate is unremarkable. Other: Trace fluid in the abdomen and pelvis likely from peritoneal dialysate. Right pelvic location of peritoneal dialysis catheter. Musculoskeletal: No acute or significant osseous findings. IMPRESSION: Chest CT: 1. Stable extraperitoneal soft tissue nodules likely neurogenic in origin. 2. No acute cardiopulmonary disease. CT abdomen and pelvis 1. Uncomplicated cholelithiasis. 2. Stable mild splenomegaly. 3. Smaller right adrenal hemorrhage since recent comparison now measuring 4.5 x 4.7 cm. 4. Hazy mesenteric fat with tiny nodules likely representing small nodes compatible with sclerosing mesenteritis. This is stable as well. Electronically Signed   By: Ashley Royalty M.D.   On: 09/23/2017 20:44   Dg Chest 2 View  Result Date: 09/23/2017 CLINICAL DATA:  Chest pain EXAM: CHEST - 2 VIEW COMPARISON:  Chest CT 08/06/2017 FINDINGS: Right basilar atelectasis. No other consolidation. No pleural effusion or pneumothorax. No pulmonary edema. Normal cardiomediastinal contours. IMPRESSION: Right basilar atelectasis. Electronically Signed   By: Ulyses Jarred M.D.   On: 09/23/2017 18:45   Ct Chest Wo Contrast  Result Date: 09/23/2017 CLINICAL DATA:  Back pain and pressure radiating to the nipple per patient. Patient states a month ago that he had an adrenal hemorrhage and symptoms are similar. EXAM: CT CHEST, ABDOMEN AND PELVIS WITHOUT  CONTRAST TECHNIQUE: Multidetector CT imaging of the chest, abdomen and pelvis was performed following the standard protocol without IV contrast. COMPARISON:  Chest CT 08/06/2017, CT abdomen and pelvis 08/08/2017 FINDINGS: CT CHEST FINDINGS Cardiovascular: Normal size heart without pericardial effusion. Minimal coronary arteriosclerosis along the LAD. Nonaneurysmal thoracic aorta without atherosclerotic calcifications. Unremarkable noncontrast appearance of the pulmonary vasculature. Mediastinum/Nodes: Stable appearance of the mediastinal and hilar lymph nodes, the largest measuring 11 mm short axis in the right lower paratracheal station. No definite hilar adenopathy given limitations of this noncontrast study. Esophagus is unremarkable. Patent trachea and mainstem bronchi. No thyroid mass. Lungs/Pleura: Chronic bibasilar atelectasis and scarring. Multiple extrapleural soft tissue densities/lesions are redemonstrated in the posterior hemithoraces bilaterally, the largest is posteriorly  on the right measuring 1.3 x 2.6 cm versus 1.5 x 2.7 cm previously, stable in appearance possibly neurogenic in etiology. Clearing of previous small effusions. No pneumothorax. Musculoskeletal: Degenerative change is noted along the dorsal spine without worrisome lytic osseous sclerotic lesions. CT ABDOMEN PELVIS FINDINGS Hepatobiliary: Mild hepatic steatosis without focal liver lesions. Uncomplicated cholelithiasis. Pancreas: Unremarkable. No pancreatic ductal dilatation or surrounding inflammatory changes. Spleen: Stable mild splenomegaly without focal mass. The spleen measures 15.4 x 4.9 x 13.8 cm (volume = 550 cm^3). Adrenals/Urinary Tract: Stigmata of prior right adrenal hemorrhage now measuring 4.5 x 4.7 cm versus 6.3 x 6 cm. No new focus of hemorrhage is identified. Embolization coils are noted about the left renal pelvis and hilum as before. Unchanged exophytic interpolar right renal cyst measuring 3.5 cm. Scarring noted about  the left kidney related to prior hematoma, unchanged in appearance with exophytic fatty focus seen off the upper. No renal or ureteral calculi. No hydronephrosis. The bladder is unremarkable for degree of distention. Stomach/Bowel: Stomach is within normal limits. No findings of acute appendicitis. Scattered left-sided colonic diverticulosis. No evidence of bowel wall thickening, distention, or inflammatory changes. Vascular/Lymphatic: Mild aortoiliac atherosclerosis. Hazy mesenteric fat with tiny mesenteric lymph nodes possibly representing stigmata of sclerosing mesenteritis. Reproductive: Prostate is unremarkable. Other: Trace fluid in the abdomen and pelvis likely from peritoneal dialysate. Right pelvic location of peritoneal dialysis catheter. Musculoskeletal: No acute or significant osseous findings. IMPRESSION: Chest CT: 1. Stable extraperitoneal soft tissue nodules likely neurogenic in origin. 2. No acute cardiopulmonary disease. CT abdomen and pelvis 1. Uncomplicated cholelithiasis. 2. Stable mild splenomegaly. 3. Smaller right adrenal hemorrhage since recent comparison now measuring 4.5 x 4.7 cm. 4. Hazy mesenteric fat with tiny nodules likely representing small nodes compatible with sclerosing mesenteritis. This is stable as well. Electronically Signed   By: Ashley Royalty M.D.   On: 09/23/2017 20:44   Mr Thoracic Spine Wo Contrast  Result Date: 09/24/2017 CLINICAL DATA:  Known history of amyloidosis, and chronic kidney disease, presents to the emergency room for intractable severe RIGHT upper back pain. EXAM: MRI THORACIC SPINE WITHOUT CONTRAST TECHNIQUE: Multiplanar, multisequence MR imaging of the thoracic spine was performed. No intravenous contrast was administered. COMPARISON:  CT chest and abdomen 09/23/2017 FINDINGS: Alignment:  Anatomic Vertebrae: Low signal intensity bone marrow on T1 weighted images, likely related to chronic renal disease. No areas concerning for fracture, bone lesion, or  diskitis. Cord:  Normal signal and morphology. Paraspinal and other soft tissues: Paraspinous ovoid soft tissue densities along the posterior ribs consistent with neurogenic lesions, stable from prior imaging. Chronic RIGHT adrenal hemorrhage. Fatty upper pole lesion of the LEFT kidney. Disc levels: The individual disc spaces are examined as follows: T1-2:  Unremarkable. T2-3:  Shallow protrusion.  No impingement. T3-4: Shallow central and leftward protrusion. Effacement anterior subarachnoid space. No impingement. T4-5:  Focal leftward protrusion.  No definite impingement. T5-6: Central and leftward protrusion. Slight cord displacement, without stenosis or abnormal signal. T6-7:  Unremarkable. T7-8: Focal rightward protrusion. No foraminal narrowing or cord displacement. T8-9:  Unremarkable. T9-10:  Unremarkable. T10-11: Broad-based protrusion extends to both the RIGHT and LEFT, and effaces the anterior subarachnoid space. No foraminal narrowing or stenosis. T11-12:  Unremarkable. T12-L1: Unremarkable. IMPRESSION: Multilevel thoracic spondylosis. Focal RIGHT-sided protrusions at T7-8 and T10-11, not clearly compressive. Good general agreement with recent CT. No thoracic spine vertebral body fracture, or subluxation. Electronically Signed   By: Staci Righter M.D.   On: 09/24/2017 10:42   Nm Pulmonary  Vent And Perf (v/q Scan)  Result Date: 09/24/2017 CLINICAL DATA:  Shortness of breath.  Elevated D-dimer EXAM: NUCLEAR MEDICINE VENTILATION - PERFUSION LUNG SCAN VIEWS: Anterior, posterior, left lateral, right lateral, RPO, LPO, RAO, LAO-ventilation and perfusion RADIOPHARMACEUTICALS:  32.856 mCi of Tc-38m DTPA aerosol inhalation and 4.01 mCi Tc79m-MAA IV COMPARISON:  Chest CT September 23, 2017 FINDINGS: Ventilation: Radiotracer uptake on the ventilation study is homogeneous and symmetric bilaterally. No appreciable ventilation defects. Perfusion: Radiotracer uptake on the perfusion study is homogeneous and symmetric  bilaterally. No appreciable perfusion defects. IMPRESSION: No appreciable ventilation or perfusion defects. This study constitutes a very low probability of pulmonary embolus. Electronically Signed   By: Lowella Grip III M.D.   On: 09/24/2017 11:44   US Venous Img Lower Bilateral  Result Date: 09/24/2017 CLINICAL DATA:  52 year old male with a history of chest pain EXAM: BILATERAL LOWER EXTREMITY VENOUS DOPPLER ULTRASOUND TECHNIQUE: Gray-scale sonography with graded compression, as well as color Doppler and duplex ultrasound were performed to evaluate the lower extremity deep venous systems from the level of the common femoral vein and including the common femoral, femoral, profunda femoral, popliteal and calf veins including the posterior tibial, peroneal and gastrocnemius veins when visible. The superficial great saphenous vein was also interrogated. Spectral Doppler was utilized to evaluate flow at rest and with distal augmentation maneuvers in the common femoral, femoral and popliteal veins. COMPARISON:  None. FINDINGS: RIGHT LOWER EXTREMITY Common Femoral Vein: No evidence of thrombus. Normal compressibility, respiratory phasicity and response to augmentation. Saphenofemoral Junction: No evidence of thrombus. Normal compressibility and flow on color Doppler imaging. Profunda Femoral Vein: No evidence of thrombus. Normal compressibility and flow on color Doppler imaging. Femoral Vein: No evidence of thrombus. Normal compressibility, respiratory phasicity and response to augmentation. Popliteal Vein: No evidence of thrombus. Normal compressibility, respiratory phasicity and response to augmentation. Calf Veins: No evidence of thrombus. Normal compressibility and flow on color Doppler imaging. Superficial Great Saphenous Vein: No evidence of thrombus. Normal compressibility and flow on color Doppler imaging. Other Findings:  None. LEFT LOWER EXTREMITY Common Femoral Vein: No evidence of thrombus. Normal  compressibility, respiratory phasicity and response to augmentation. Saphenofemoral Junction: No evidence of thrombus. Normal compressibility and flow on color Doppler imaging. Profunda Femoral Vein: No evidence of thrombus. Normal compressibility and flow on color Doppler imaging. Femoral Vein: No evidence of thrombus. Normal compressibility, respiratory phasicity and response to augmentation. Popliteal Vein: No evidence of thrombus. Normal compressibility, respiratory phasicity and response to augmentation. Calf Veins: No evidence of thrombus. Normal compressibility and flow on color Doppler imaging. Superficial Great Saphenous Vein: No evidence of thrombus. Normal compressibility and flow on color Doppler imaging. Other Findings:  None. IMPRESSION: Sonographic survey of the bilateral lower extremities negative for DVT Signed, Dulcy Fanny. Earleen Newport, DO [facility] Electronically Signed   By: Corrie Mckusick D.O.   On: 09/24/2017 07:44   Dg Abd 2 Views  Result Date: 09/24/2017 CLINICAL DATA:  52 year old male with right adrenal hemorrhage. Recurrent severe right upper back pain. Prior left renal embolization. EXAM: ABDOMEN - 2 VIEW COMPARISON:  CT Abdomen and Pelvis 09/23/2017 and earlier. FINDINGS: Upright and supine views. No pneumoperitoneum identified. Peritoneal dialysis catheter remains in place. Sequelae of left renal region embolization. Non obstructed bowel gas pattern. Stable abdominal and pelvic visceral contours. Mild lung base atelectasis. No acute osseous abnormality identified. Chronic dystrophic ossification about the right ischium. IMPRESSION: 1.  Normal bowel gas pattern, no free air. 2.  Stable radiographic appearance of the abdomen and pelvis. Mild lung base atelectasis. Electronically Signed   By: Genevie Ann M.D.   On: 09/24/2017 08:07   Dg Abd 2 Views  Result Date: 09/23/2017 CLINICAL DATA:  Back pain and pressure radiating to the nipple.  End-stage renal disease with peritoneal dialysis. Patient's most recent dialysis was last evening. EXAM: ABDOMEN - 2 VIEW COMPARISON:  CT 08/08/2017 FINDINGS: The tip of a peritoneal dialysis catheter projects over the right hemipelvis. A moderate amount of stool is seen within the colon, more so on the right. No bowel obstruction is visualized. Embolization coils are seen projecting over the left upper quadrant. No free air. No radiopaque calculi. Stable phleboliths are seen bilaterally within the pelvis. No acute osseous appearing abnormality. IMPRESSION: 1. Increased colonic stool burden on the right. No free air nor bowel obstruction. 2. Peritoneal dialysis catheter in the right in the pelvis. Electronically Signed   By: Ashley Royalty M.D.   On: 09/23/2017 19:39    EKG:   Orders placed or performed during the hospital encounter of 09/23/17  . ED EKG  . ED EKG  . EKG 12-Lead  . EKG 12-Lead  . EKG 12-Lead  . EKG 12-Lead      Management plans discussed with the patient, family and they are in agreement.  CODE STATUS:     Code Status Orders  (From admission, onward)        Start     Ordered   09/23/17 2325  Full code  Continuous     09/23/17 2324    Code Status History    Date Active Date Inactive Code Status Order ID Comments User Context   08/06/2017 1113 08/09/2017 1726 Full Code [id number]  Dustin Flock, MD ED   01/29/2017 1419 01/31/2017 1439 Full Code [id number]  Theodoro Grist, MD Inpatient   05/31/2015 0959 06/01/2015 0331 Full Code [id number]  Sabino Dick, MD HOV   04/06/2015 1239 04/21/2015 1345 Full Code [id number]  Anthonette Legato, MD Inpatient      TOTAL TIME TAKING CARE OF THIS PATIENT: 45 minutes.    Avel Peace Shannyn Jankowiak M.D on 09/25/2017 at 9:56 AM  Between 7am to 6pm - Pager - [phone]  After 6pm go to [email or web address] - password [email or web address] [facility]  Office  [phone]  CC: Primary care physician; Alycia Rossetti, MD   Note: This  dictation was prepared with Dragon dictation along with smaller phrase technology. Any transcriptional errors that result from this process are unintentional.

## 2017-09-25 NOTE — Progress Notes (Signed)
Discharge order received. Patient is alert and oriented. Vital signs stable . No signs of acute distress. Discharge instructions given. Patient verbalized understanding. No other issues noted at this time.   

## 2017-09-25 NOTE — Progress Notes (Signed)
PT Cancellation Note  Patient Details Name: Joseph [name] MRN: [id number] DOB: Dec 10, 1965   Cancelled Treatment:    Reason Eval/Treat Not Completed: PT screened, no needs identified, will sign off Pt with minimal back pain with no associated hesitancy or functional limitations.  No sensation issues, focal weakness or other issues.  Pt showed very good strength with testing, good confidence/balance with standing and was easily able to ambulate 250 ft and go up/down steps w/o AD, w/o safety issues and w/o pain/fatigue.  Not PT needs, orders will be completed.  Kreg Shropshire, DPT 09/25/2017, 9:19 AM

## 2017-09-26 ENCOUNTER — Other Ambulatory Visit: Payer: Self-pay | Admitting: *Deleted

## 2017-09-26 DIAGNOSIS — Z992 Dependence on renal dialysis: Secondary | ICD-10-CM | POA: Diagnosis not present

## 2017-09-26 DIAGNOSIS — N186 End stage renal disease: Secondary | ICD-10-CM | POA: Diagnosis not present

## 2017-09-26 DIAGNOSIS — E7800 Pure hypercholesterolemia, unspecified: Secondary | ICD-10-CM | POA: Diagnosis not present

## 2017-09-26 NOTE — Patient Outreach (Signed)
Joseph [name]) Care Management  09/26/2017  Joseph [name] [facility] [birth year] [id number]  Subjective: Telephone call to patient's home number, spoke with patient, and HIPAA verified.  Discussed [facility] Care Management UMR Transition of care follow up, patient voiced understanding, and is in agreement to follow up.  Patient states he is feeling much better, will call primary MD's office to set up a sooner follow up appointment, and has several MD appointments coming up in the near future with specialist.  States he is also planning to discuss a kidney stem cell research study at [facility] with his MD.  Patient states he is able to manage self care and has assistance as needed with activities of daily living / home management. Patient voices understanding of medical diagnosis and treatment plan.  States he is accessing the following [facility] benefits: outpatient pharmacy, hospital indemnity (not sure if chosen, will ask his wife to follow up to file claim if appropriate), and wife has exhausted family medical leave act (FMLA) benefits at this time.  States wife may be able to file for additional FMLA when he receives kidney transplant.  Patient states he does not have any education material, transition of care, care coordination, disease management, disease monitoring, transportation, community resource, or pharmacy needs at this time.  States he is very appreciative of the follow up and is in agreement to receive [facility] Management information.     Objective: Per KPN (Knowledge Performance Now, point of care tool) and chart review,patient hospitalized for Adrenal hemorrhage and chest pain.  Patient hospitalized 08/06/17 -08/09/17 forChest pain likely referred pain from his right adrenal gland hemorrhage.Patient hospitalized 01/29/17 - 01/31/17 for sepsis due to right lower lobe pneumonia.Patient also has a history ofEnd-stage renal disease-secondary to amyloidosis, on peritoneal  dialysis,dry eyes, hypertension, hyperlipidemia, and anemia.[facility] Care Management closed case due to unable to reach patient on 08/24/17.   [facility] Care Management completed transition of care follow up call on 02/02/17.        Assessment: Received UMR Transition of care referral on 09/24/17.Transition of care follow up completed, no care management needs, and will proceed with case closure.        Plan: RNCM will send patient successful outreach letter, [facility] pamphlet, and magnet. RNCM will complete case closure due to follow up completed / no care management needs.        Dezarai Prew H. Annia Friendly, BSN, [place] Management [facility] Telephonic CM Phone: [phone] Fax: [phone]

## 2017-09-27 DIAGNOSIS — N186 End stage renal disease: Secondary | ICD-10-CM | POA: Diagnosis not present

## 2017-09-27 DIAGNOSIS — Z992 Dependence on renal dialysis: Secondary | ICD-10-CM | POA: Diagnosis not present

## 2017-09-28 DIAGNOSIS — Z760 Encounter for issue of repeat prescription: Secondary | ICD-10-CM | POA: Diagnosis not present

## 2017-09-28 DIAGNOSIS — Z992 Dependence on renal dialysis: Secondary | ICD-10-CM | POA: Diagnosis not present

## 2017-09-28 DIAGNOSIS — N186 End stage renal disease: Secondary | ICD-10-CM | POA: Diagnosis not present

## 2017-09-29 DIAGNOSIS — N186 End stage renal disease: Secondary | ICD-10-CM | POA: Diagnosis not present

## 2017-09-29 DIAGNOSIS — Z992 Dependence on renal dialysis: Secondary | ICD-10-CM | POA: Diagnosis not present

## 2017-09-30 DIAGNOSIS — N186 End stage renal disease: Secondary | ICD-10-CM | POA: Diagnosis not present

## 2017-09-30 DIAGNOSIS — Z992 Dependence on renal dialysis: Secondary | ICD-10-CM | POA: Diagnosis not present

## 2017-10-01 DIAGNOSIS — N186 End stage renal disease: Secondary | ICD-10-CM | POA: Diagnosis not present

## 2017-10-01 DIAGNOSIS — Z992 Dependence on renal dialysis: Secondary | ICD-10-CM | POA: Diagnosis not present

## 2017-10-02 DIAGNOSIS — N186 End stage renal disease: Secondary | ICD-10-CM | POA: Diagnosis not present

## 2017-10-02 DIAGNOSIS — Z992 Dependence on renal dialysis: Secondary | ICD-10-CM | POA: Diagnosis not present

## 2017-10-03 DIAGNOSIS — N186 End stage renal disease: Secondary | ICD-10-CM | POA: Diagnosis not present

## 2017-10-03 DIAGNOSIS — Z992 Dependence on renal dialysis: Secondary | ICD-10-CM | POA: Diagnosis not present

## 2017-10-04 DIAGNOSIS — Z992 Dependence on renal dialysis: Secondary | ICD-10-CM | POA: Diagnosis not present

## 2017-10-04 DIAGNOSIS — N186 End stage renal disease: Secondary | ICD-10-CM | POA: Diagnosis not present

## 2017-10-05 DIAGNOSIS — Z992 Dependence on renal dialysis: Secondary | ICD-10-CM | POA: Diagnosis not present

## 2017-10-05 DIAGNOSIS — N186 End stage renal disease: Secondary | ICD-10-CM | POA: Diagnosis not present

## 2017-10-06 DIAGNOSIS — N186 End stage renal disease: Secondary | ICD-10-CM | POA: Diagnosis not present

## 2017-10-06 DIAGNOSIS — Z992 Dependence on renal dialysis: Secondary | ICD-10-CM | POA: Diagnosis not present

## 2017-10-07 DIAGNOSIS — N186 End stage renal disease: Secondary | ICD-10-CM | POA: Diagnosis not present

## 2017-10-07 DIAGNOSIS — Z992 Dependence on renal dialysis: Secondary | ICD-10-CM | POA: Diagnosis not present

## 2017-10-08 DIAGNOSIS — N186 End stage renal disease: Secondary | ICD-10-CM | POA: Diagnosis not present

## 2017-10-08 DIAGNOSIS — Z992 Dependence on renal dialysis: Secondary | ICD-10-CM | POA: Diagnosis not present

## 2017-10-09 DIAGNOSIS — Z992 Dependence on renal dialysis: Secondary | ICD-10-CM | POA: Diagnosis not present

## 2017-10-09 DIAGNOSIS — N186 End stage renal disease: Secondary | ICD-10-CM | POA: Diagnosis not present

## 2017-10-10 DIAGNOSIS — N186 End stage renal disease: Secondary | ICD-10-CM | POA: Diagnosis not present

## 2017-10-10 DIAGNOSIS — Z992 Dependence on renal dialysis: Secondary | ICD-10-CM | POA: Diagnosis not present

## 2017-10-11 DIAGNOSIS — N186 End stage renal disease: Secondary | ICD-10-CM | POA: Diagnosis not present

## 2017-10-11 DIAGNOSIS — Z992 Dependence on renal dialysis: Secondary | ICD-10-CM | POA: Diagnosis not present

## 2017-10-12 DIAGNOSIS — N186 End stage renal disease: Secondary | ICD-10-CM | POA: Diagnosis not present

## 2017-10-12 DIAGNOSIS — Z992 Dependence on renal dialysis: Secondary | ICD-10-CM | POA: Diagnosis not present

## 2017-10-13 DIAGNOSIS — Z992 Dependence on renal dialysis: Secondary | ICD-10-CM | POA: Diagnosis not present

## 2017-10-13 DIAGNOSIS — N186 End stage renal disease: Secondary | ICD-10-CM | POA: Diagnosis not present

## 2017-10-14 DIAGNOSIS — N186 End stage renal disease: Secondary | ICD-10-CM | POA: Diagnosis not present

## 2017-10-14 DIAGNOSIS — Z992 Dependence on renal dialysis: Secondary | ICD-10-CM | POA: Diagnosis not present

## 2017-10-15 DIAGNOSIS — Z992 Dependence on renal dialysis: Secondary | ICD-10-CM | POA: Diagnosis not present

## 2017-10-15 DIAGNOSIS — N186 End stage renal disease: Secondary | ICD-10-CM | POA: Diagnosis not present

## 2017-10-16 DIAGNOSIS — N186 End stage renal disease: Secondary | ICD-10-CM | POA: Diagnosis not present

## 2017-10-16 DIAGNOSIS — Z992 Dependence on renal dialysis: Secondary | ICD-10-CM | POA: Diagnosis not present

## 2017-10-17 DIAGNOSIS — N186 End stage renal disease: Secondary | ICD-10-CM | POA: Diagnosis not present

## 2017-10-17 DIAGNOSIS — Z992 Dependence on renal dialysis: Secondary | ICD-10-CM | POA: Diagnosis not present

## 2017-10-18 DIAGNOSIS — Z992 Dependence on renal dialysis: Secondary | ICD-10-CM | POA: Diagnosis not present

## 2017-10-18 DIAGNOSIS — N186 End stage renal disease: Secondary | ICD-10-CM | POA: Diagnosis not present

## 2017-10-19 DIAGNOSIS — Z992 Dependence on renal dialysis: Secondary | ICD-10-CM | POA: Diagnosis not present

## 2017-10-19 DIAGNOSIS — N186 End stage renal disease: Secondary | ICD-10-CM | POA: Diagnosis not present

## 2017-10-20 DIAGNOSIS — Z992 Dependence on renal dialysis: Secondary | ICD-10-CM | POA: Diagnosis not present

## 2017-10-20 DIAGNOSIS — N186 End stage renal disease: Secondary | ICD-10-CM | POA: Diagnosis not present

## 2017-10-21 DIAGNOSIS — N186 End stage renal disease: Secondary | ICD-10-CM | POA: Diagnosis not present

## 2017-10-21 DIAGNOSIS — Z992 Dependence on renal dialysis: Secondary | ICD-10-CM | POA: Diagnosis not present

## 2017-10-22 DIAGNOSIS — N186 End stage renal disease: Secondary | ICD-10-CM | POA: Diagnosis not present

## 2017-10-22 DIAGNOSIS — Z992 Dependence on renal dialysis: Secondary | ICD-10-CM | POA: Diagnosis not present

## 2017-10-23 DIAGNOSIS — Z992 Dependence on renal dialysis: Secondary | ICD-10-CM | POA: Diagnosis not present

## 2017-10-23 DIAGNOSIS — N186 End stage renal disease: Secondary | ICD-10-CM | POA: Diagnosis not present

## 2017-10-24 DIAGNOSIS — N186 End stage renal disease: Secondary | ICD-10-CM | POA: Diagnosis not present

## 2017-10-24 DIAGNOSIS — Z992 Dependence on renal dialysis: Secondary | ICD-10-CM | POA: Diagnosis not present

## 2017-10-25 DIAGNOSIS — Z992 Dependence on renal dialysis: Secondary | ICD-10-CM | POA: Diagnosis not present

## 2017-10-25 DIAGNOSIS — N186 End stage renal disease: Secondary | ICD-10-CM | POA: Diagnosis not present

## 2017-10-26 DIAGNOSIS — N186 End stage renal disease: Secondary | ICD-10-CM | POA: Diagnosis not present

## 2017-10-26 DIAGNOSIS — Z992 Dependence on renal dialysis: Secondary | ICD-10-CM | POA: Diagnosis not present

## 2017-10-27 DIAGNOSIS — Z992 Dependence on renal dialysis: Secondary | ICD-10-CM | POA: Diagnosis not present

## 2017-10-27 DIAGNOSIS — N186 End stage renal disease: Secondary | ICD-10-CM | POA: Diagnosis not present

## 2017-10-28 DIAGNOSIS — Z992 Dependence on renal dialysis: Secondary | ICD-10-CM | POA: Diagnosis not present

## 2017-10-28 DIAGNOSIS — N186 End stage renal disease: Secondary | ICD-10-CM | POA: Diagnosis not present

## 2017-10-29 DIAGNOSIS — Z992 Dependence on renal dialysis: Secondary | ICD-10-CM | POA: Diagnosis not present

## 2017-10-29 DIAGNOSIS — N186 End stage renal disease: Secondary | ICD-10-CM | POA: Diagnosis not present

## 2017-10-30 DIAGNOSIS — Z992 Dependence on renal dialysis: Secondary | ICD-10-CM | POA: Diagnosis not present

## 2017-10-30 DIAGNOSIS — N186 End stage renal disease: Secondary | ICD-10-CM | POA: Diagnosis not present

## 2017-10-31 ENCOUNTER — Encounter: Payer: Self-pay | Admitting: Cardiovascular Disease

## 2017-10-31 DIAGNOSIS — N186 End stage renal disease: Secondary | ICD-10-CM | POA: Diagnosis not present

## 2017-10-31 DIAGNOSIS — Z992 Dependence on renal dialysis: Secondary | ICD-10-CM | POA: Diagnosis not present

## 2017-11-01 DIAGNOSIS — Z992 Dependence on renal dialysis: Secondary | ICD-10-CM | POA: Diagnosis not present

## 2017-11-01 DIAGNOSIS — N186 End stage renal disease: Secondary | ICD-10-CM | POA: Diagnosis not present

## 2017-11-02 DIAGNOSIS — Z992 Dependence on renal dialysis: Secondary | ICD-10-CM | POA: Diagnosis not present

## 2017-11-02 DIAGNOSIS — N186 End stage renal disease: Secondary | ICD-10-CM | POA: Diagnosis not present

## 2017-11-03 DIAGNOSIS — Z992 Dependence on renal dialysis: Secondary | ICD-10-CM | POA: Diagnosis not present

## 2017-11-03 DIAGNOSIS — N186 End stage renal disease: Secondary | ICD-10-CM | POA: Diagnosis not present

## 2017-11-04 DIAGNOSIS — N186 End stage renal disease: Secondary | ICD-10-CM | POA: Diagnosis not present

## 2017-11-04 DIAGNOSIS — Z992 Dependence on renal dialysis: Secondary | ICD-10-CM | POA: Diagnosis not present

## 2017-11-05 DIAGNOSIS — N186 End stage renal disease: Secondary | ICD-10-CM | POA: Diagnosis not present

## 2017-11-05 DIAGNOSIS — Z992 Dependence on renal dialysis: Secondary | ICD-10-CM | POA: Diagnosis not present

## 2017-11-06 DIAGNOSIS — Z6826 Body mass index (BMI) 26.0-26.9, adult: Secondary | ICD-10-CM | POA: Diagnosis not present

## 2017-11-06 DIAGNOSIS — E854 Organ-limited amyloidosis: Secondary | ICD-10-CM | POA: Diagnosis not present

## 2017-11-06 DIAGNOSIS — Z992 Dependence on renal dialysis: Secondary | ICD-10-CM | POA: Diagnosis not present

## 2017-11-06 DIAGNOSIS — E852 Heredofamilial amyloidosis, unspecified: Secondary | ICD-10-CM | POA: Diagnosis not present

## 2017-11-06 DIAGNOSIS — H35713 Central serous chorioretinopathy, bilateral: Secondary | ICD-10-CM | POA: Diagnosis not present

## 2017-11-06 DIAGNOSIS — N186 End stage renal disease: Secondary | ICD-10-CM | POA: Diagnosis not present

## 2017-11-06 DIAGNOSIS — N08 Glomerular disorders in diseases classified elsewhere: Secondary | ICD-10-CM | POA: Diagnosis not present

## 2017-11-07 ENCOUNTER — Ambulatory Visit
Admission: RE | Admit: 2017-11-07 | Discharge: 2017-11-07 | Disposition: A | Discharge status: Home / Self Care | Payer: 59 | Source: Ambulatory Visit | Attending: Cardiovascular Disease | Admitting: Cardiovascular Disease

## 2017-11-07 DIAGNOSIS — J449 Chronic obstructive pulmonary disease, unspecified: Secondary | ICD-10-CM | POA: Diagnosis not present

## 2017-11-07 DIAGNOSIS — E854 Organ-limited amyloidosis: Secondary | ICD-10-CM

## 2017-11-07 DIAGNOSIS — N08 Glomerular disorders in diseases classified elsewhere: Secondary | ICD-10-CM

## 2017-11-07 DIAGNOSIS — N186 End stage renal disease: Secondary | ICD-10-CM

## 2017-11-07 DIAGNOSIS — Z992 Dependence on renal dialysis: Secondary | ICD-10-CM | POA: Diagnosis not present

## 2017-11-07 MED ORDER — TECHNETIUM TC 99M TETROFOSMIN IV KIT
13.0000 | PACK | Freq: Once | INTRAVENOUS | Status: AC | PRN
  Administered 2017-11-07: 13.76 via INTRAVENOUS

## 2017-11-07 MED ORDER — TECHNETIUM TC 99M TETROFOSMIN IV KIT
31.6490 | PACK | Freq: Once | INTRAVENOUS | Status: AC | PRN
  Administered 2017-11-07: 31.649 via INTRAVENOUS

## 2017-11-08 DIAGNOSIS — N186 End stage renal disease: Secondary | ICD-10-CM | POA: Diagnosis not present

## 2017-11-08 DIAGNOSIS — Z992 Dependence on renal dialysis: Secondary | ICD-10-CM | POA: Diagnosis not present

## 2017-11-08 LAB — NM MYOCAR MULTI W/SPECT W/WALL MOTION / EF
CHL CUP NUCLEAR SSS: 1
CSEPEDS: 6 s
CSEPHR: 87 %
Estimated workload: 10.1 METS
Exercise duration (min): 9 min
LVDIAVOL: 59 mL (ref 62–150)
LVSYSVOL: 21 mL
MPHR: 166 {beats}/min
NUC STRESS TID: 0.76
Peak HR: 146 {beats}/min
Rest HR: 74 {beats}/min
SDS: 0
SRS: 0

## 2017-11-09 DIAGNOSIS — Z992 Dependence on renal dialysis: Secondary | ICD-10-CM | POA: Diagnosis not present

## 2017-11-09 DIAGNOSIS — N186 End stage renal disease: Secondary | ICD-10-CM | POA: Diagnosis not present

## 2017-11-10 DIAGNOSIS — N186 End stage renal disease: Secondary | ICD-10-CM | POA: Diagnosis not present

## 2017-11-10 DIAGNOSIS — Z992 Dependence on renal dialysis: Secondary | ICD-10-CM | POA: Diagnosis not present

## 2017-11-11 DIAGNOSIS — N186 End stage renal disease: Secondary | ICD-10-CM | POA: Diagnosis not present

## 2017-11-11 DIAGNOSIS — Z992 Dependence on renal dialysis: Secondary | ICD-10-CM | POA: Diagnosis not present

## 2017-11-12 DIAGNOSIS — Z992 Dependence on renal dialysis: Secondary | ICD-10-CM | POA: Diagnosis not present

## 2017-11-12 DIAGNOSIS — N186 End stage renal disease: Secondary | ICD-10-CM | POA: Diagnosis not present

## 2017-11-13 DIAGNOSIS — H353221 Exudative age-related macular degeneration, left eye, with active choroidal neovascularization: Secondary | ICD-10-CM | POA: Diagnosis not present

## 2017-11-13 DIAGNOSIS — H35713 Central serous chorioretinopathy, bilateral: Secondary | ICD-10-CM | POA: Diagnosis not present

## 2017-11-13 DIAGNOSIS — Z992 Dependence on renal dialysis: Secondary | ICD-10-CM | POA: Diagnosis not present

## 2017-11-13 DIAGNOSIS — H353231 Exudative age-related macular degeneration, bilateral, with active choroidal neovascularization: Secondary | ICD-10-CM | POA: Diagnosis not present

## 2017-11-13 DIAGNOSIS — Z79899 Other long term (current) drug therapy: Secondary | ICD-10-CM | POA: Diagnosis not present

## 2017-11-13 DIAGNOSIS — N186 End stage renal disease: Secondary | ICD-10-CM | POA: Diagnosis not present

## 2017-11-13 DIAGNOSIS — H3581 Retinal edema: Secondary | ICD-10-CM | POA: Diagnosis not present

## 2017-11-13 DIAGNOSIS — H353211 Exudative age-related macular degeneration, right eye, with active choroidal neovascularization: Secondary | ICD-10-CM | POA: Diagnosis not present

## 2017-11-13 DIAGNOSIS — Z961 Presence of intraocular lens: Secondary | ICD-10-CM | POA: Insufficient documentation

## 2017-11-14 DIAGNOSIS — Z992 Dependence on renal dialysis: Secondary | ICD-10-CM | POA: Diagnosis not present

## 2017-11-14 DIAGNOSIS — N186 End stage renal disease: Secondary | ICD-10-CM | POA: Diagnosis not present

## 2017-11-15 DIAGNOSIS — Z01818 Encounter for other preprocedural examination: Secondary | ICD-10-CM | POA: Diagnosis not present

## 2017-11-15 DIAGNOSIS — Z114 Encounter for screening for human immunodeficiency virus [HIV]: Secondary | ICD-10-CM | POA: Diagnosis not present

## 2017-11-15 DIAGNOSIS — Z992 Dependence on renal dialysis: Secondary | ICD-10-CM | POA: Diagnosis not present

## 2017-11-15 DIAGNOSIS — N186 End stage renal disease: Secondary | ICD-10-CM | POA: Diagnosis not present

## 2017-11-15 DIAGNOSIS — Z7682 Awaiting organ transplant status: Secondary | ICD-10-CM | POA: Diagnosis not present

## 2017-11-15 DIAGNOSIS — E859 Amyloidosis, unspecified: Secondary | ICD-10-CM | POA: Diagnosis not present

## 2017-11-15 DIAGNOSIS — Z1159 Encounter for screening for other viral diseases: Secondary | ICD-10-CM | POA: Diagnosis not present

## 2017-11-16 DIAGNOSIS — N186 End stage renal disease: Secondary | ICD-10-CM | POA: Diagnosis not present

## 2017-11-16 DIAGNOSIS — Z992 Dependence on renal dialysis: Secondary | ICD-10-CM | POA: Diagnosis not present

## 2017-11-17 DIAGNOSIS — Z992 Dependence on renal dialysis: Secondary | ICD-10-CM | POA: Diagnosis not present

## 2017-11-17 DIAGNOSIS — N186 End stage renal disease: Secondary | ICD-10-CM | POA: Diagnosis not present

## 2017-11-18 DIAGNOSIS — N186 End stage renal disease: Secondary | ICD-10-CM | POA: Diagnosis not present

## 2017-11-18 DIAGNOSIS — Z992 Dependence on renal dialysis: Secondary | ICD-10-CM | POA: Diagnosis not present

## 2017-11-19 DIAGNOSIS — Z992 Dependence on renal dialysis: Secondary | ICD-10-CM | POA: Diagnosis not present

## 2017-11-19 DIAGNOSIS — N186 End stage renal disease: Secondary | ICD-10-CM | POA: Diagnosis not present

## 2017-11-20 DIAGNOSIS — H353221 Exudative age-related macular degeneration, left eye, with active choroidal neovascularization: Secondary | ICD-10-CM | POA: Diagnosis not present

## 2017-11-20 DIAGNOSIS — H35723 Serous detachment of retinal pigment epithelium, bilateral: Secondary | ICD-10-CM | POA: Diagnosis not present

## 2017-11-20 DIAGNOSIS — Z992 Dependence on renal dialysis: Secondary | ICD-10-CM | POA: Diagnosis not present

## 2017-11-20 DIAGNOSIS — H35713 Central serous chorioretinopathy, bilateral: Secondary | ICD-10-CM | POA: Diagnosis not present

## 2017-11-20 DIAGNOSIS — N186 End stage renal disease: Secondary | ICD-10-CM | POA: Diagnosis not present

## 2017-11-20 DIAGNOSIS — H35352 Cystoid macular degeneration, left eye: Secondary | ICD-10-CM | POA: Diagnosis not present

## 2017-11-20 DIAGNOSIS — H353211 Exudative age-related macular degeneration, right eye, with active choroidal neovascularization: Secondary | ICD-10-CM | POA: Diagnosis not present

## 2017-11-21 DIAGNOSIS — Z992 Dependence on renal dialysis: Secondary | ICD-10-CM | POA: Diagnosis not present

## 2017-11-21 DIAGNOSIS — N186 End stage renal disease: Secondary | ICD-10-CM | POA: Diagnosis not present

## 2017-11-22 DIAGNOSIS — Z992 Dependence on renal dialysis: Secondary | ICD-10-CM | POA: Diagnosis not present

## 2017-11-22 DIAGNOSIS — N186 End stage renal disease: Secondary | ICD-10-CM | POA: Diagnosis not present

## 2017-11-23 DIAGNOSIS — N186 End stage renal disease: Secondary | ICD-10-CM | POA: Diagnosis not present

## 2017-11-23 DIAGNOSIS — Z992 Dependence on renal dialysis: Secondary | ICD-10-CM | POA: Diagnosis not present

## 2017-11-24 DIAGNOSIS — Z992 Dependence on renal dialysis: Secondary | ICD-10-CM | POA: Diagnosis not present

## 2017-11-24 DIAGNOSIS — N186 End stage renal disease: Secondary | ICD-10-CM | POA: Diagnosis not present

## 2017-11-25 DIAGNOSIS — Z992 Dependence on renal dialysis: Secondary | ICD-10-CM | POA: Diagnosis not present

## 2017-11-25 DIAGNOSIS — N186 End stage renal disease: Secondary | ICD-10-CM | POA: Diagnosis not present

## 2017-11-26 DIAGNOSIS — N186 End stage renal disease: Secondary | ICD-10-CM | POA: Diagnosis not present

## 2017-11-26 DIAGNOSIS — Z992 Dependence on renal dialysis: Secondary | ICD-10-CM | POA: Diagnosis not present

## 2017-11-27 DIAGNOSIS — N186 End stage renal disease: Secondary | ICD-10-CM | POA: Diagnosis not present

## 2017-11-27 DIAGNOSIS — Z992 Dependence on renal dialysis: Secondary | ICD-10-CM | POA: Diagnosis not present

## 2017-11-27 DIAGNOSIS — Z79899 Other long term (current) drug therapy: Secondary | ICD-10-CM | POA: Diagnosis not present

## 2017-11-28 DIAGNOSIS — Z992 Dependence on renal dialysis: Secondary | ICD-10-CM | POA: Diagnosis not present

## 2017-11-28 DIAGNOSIS — N186 End stage renal disease: Secondary | ICD-10-CM | POA: Diagnosis not present

## 2017-11-29 DIAGNOSIS — Z992 Dependence on renal dialysis: Secondary | ICD-10-CM | POA: Diagnosis not present

## 2017-11-29 DIAGNOSIS — N186 End stage renal disease: Secondary | ICD-10-CM | POA: Diagnosis not present

## 2017-11-30 DIAGNOSIS — N186 End stage renal disease: Secondary | ICD-10-CM | POA: Diagnosis not present

## 2017-11-30 DIAGNOSIS — Z992 Dependence on renal dialysis: Secondary | ICD-10-CM | POA: Diagnosis not present

## 2017-12-01 DIAGNOSIS — N186 End stage renal disease: Secondary | ICD-10-CM | POA: Diagnosis not present

## 2017-12-01 DIAGNOSIS — Z992 Dependence on renal dialysis: Secondary | ICD-10-CM | POA: Diagnosis not present

## 2017-12-02 DIAGNOSIS — N186 End stage renal disease: Secondary | ICD-10-CM | POA: Diagnosis not present

## 2017-12-02 DIAGNOSIS — Z992 Dependence on renal dialysis: Secondary | ICD-10-CM | POA: Diagnosis not present

## 2017-12-03 DIAGNOSIS — N186 End stage renal disease: Secondary | ICD-10-CM | POA: Diagnosis not present

## 2017-12-03 DIAGNOSIS — Z992 Dependence on renal dialysis: Secondary | ICD-10-CM | POA: Diagnosis not present

## 2017-12-04 DIAGNOSIS — Z992 Dependence on renal dialysis: Secondary | ICD-10-CM | POA: Diagnosis not present

## 2017-12-04 DIAGNOSIS — N186 End stage renal disease: Secondary | ICD-10-CM | POA: Diagnosis not present

## 2017-12-05 DIAGNOSIS — Z992 Dependence on renal dialysis: Secondary | ICD-10-CM | POA: Diagnosis not present

## 2017-12-05 DIAGNOSIS — N186 End stage renal disease: Secondary | ICD-10-CM | POA: Diagnosis not present

## 2017-12-06 ENCOUNTER — Encounter: Payer: Self-pay | Admitting: Cardiovascular Disease

## 2017-12-06 DIAGNOSIS — N186 End stage renal disease: Secondary | ICD-10-CM | POA: Diagnosis not present

## 2017-12-06 DIAGNOSIS — Z992 Dependence on renal dialysis: Secondary | ICD-10-CM | POA: Diagnosis not present

## 2017-12-07 DIAGNOSIS — Z992 Dependence on renal dialysis: Secondary | ICD-10-CM | POA: Diagnosis not present

## 2017-12-07 DIAGNOSIS — N186 End stage renal disease: Secondary | ICD-10-CM | POA: Diagnosis not present

## 2017-12-08 DIAGNOSIS — Z992 Dependence on renal dialysis: Secondary | ICD-10-CM | POA: Diagnosis not present

## 2017-12-08 DIAGNOSIS — N186 End stage renal disease: Secondary | ICD-10-CM | POA: Diagnosis not present

## 2017-12-09 DIAGNOSIS — N186 End stage renal disease: Secondary | ICD-10-CM | POA: Diagnosis not present

## 2017-12-09 DIAGNOSIS — Z992 Dependence on renal dialysis: Secondary | ICD-10-CM | POA: Diagnosis not present

## 2017-12-10 DIAGNOSIS — Z992 Dependence on renal dialysis: Secondary | ICD-10-CM | POA: Diagnosis not present

## 2017-12-10 DIAGNOSIS — N186 End stage renal disease: Secondary | ICD-10-CM | POA: Diagnosis not present

## 2017-12-11 DIAGNOSIS — N186 End stage renal disease: Secondary | ICD-10-CM | POA: Diagnosis not present

## 2017-12-11 DIAGNOSIS — Z992 Dependence on renal dialysis: Secondary | ICD-10-CM | POA: Diagnosis not present

## 2017-12-12 DIAGNOSIS — Z992 Dependence on renal dialysis: Secondary | ICD-10-CM | POA: Diagnosis not present

## 2017-12-12 DIAGNOSIS — N186 End stage renal disease: Secondary | ICD-10-CM | POA: Diagnosis not present

## 2017-12-13 DIAGNOSIS — Z992 Dependence on renal dialysis: Secondary | ICD-10-CM | POA: Diagnosis not present

## 2017-12-13 DIAGNOSIS — N186 End stage renal disease: Secondary | ICD-10-CM | POA: Diagnosis not present

## 2017-12-14 DIAGNOSIS — N186 End stage renal disease: Secondary | ICD-10-CM | POA: Diagnosis not present

## 2017-12-14 DIAGNOSIS — Z992 Dependence on renal dialysis: Secondary | ICD-10-CM | POA: Diagnosis not present

## 2017-12-15 DIAGNOSIS — Z992 Dependence on renal dialysis: Secondary | ICD-10-CM | POA: Diagnosis not present

## 2017-12-15 DIAGNOSIS — N186 End stage renal disease: Secondary | ICD-10-CM | POA: Diagnosis not present

## 2017-12-16 DIAGNOSIS — Z992 Dependence on renal dialysis: Secondary | ICD-10-CM | POA: Diagnosis not present

## 2017-12-16 DIAGNOSIS — N186 End stage renal disease: Secondary | ICD-10-CM | POA: Diagnosis not present

## 2017-12-17 DIAGNOSIS — Z992 Dependence on renal dialysis: Secondary | ICD-10-CM | POA: Diagnosis not present

## 2017-12-17 DIAGNOSIS — N186 End stage renal disease: Secondary | ICD-10-CM | POA: Diagnosis not present

## 2017-12-18 DIAGNOSIS — N186 End stage renal disease: Secondary | ICD-10-CM | POA: Diagnosis not present

## 2017-12-18 DIAGNOSIS — Z992 Dependence on renal dialysis: Secondary | ICD-10-CM | POA: Diagnosis not present

## 2017-12-19 DIAGNOSIS — N186 End stage renal disease: Secondary | ICD-10-CM | POA: Diagnosis not present

## 2017-12-19 DIAGNOSIS — Z992 Dependence on renal dialysis: Secondary | ICD-10-CM | POA: Diagnosis not present

## 2017-12-20 DIAGNOSIS — Z992 Dependence on renal dialysis: Secondary | ICD-10-CM | POA: Diagnosis not present

## 2017-12-20 DIAGNOSIS — N186 End stage renal disease: Secondary | ICD-10-CM | POA: Diagnosis not present

## 2017-12-21 DIAGNOSIS — Z992 Dependence on renal dialysis: Secondary | ICD-10-CM | POA: Diagnosis not present

## 2017-12-21 DIAGNOSIS — N186 End stage renal disease: Secondary | ICD-10-CM | POA: Diagnosis not present

## 2017-12-22 DIAGNOSIS — Z992 Dependence on renal dialysis: Secondary | ICD-10-CM | POA: Diagnosis not present

## 2017-12-22 DIAGNOSIS — N186 End stage renal disease: Secondary | ICD-10-CM | POA: Diagnosis not present

## 2017-12-23 DIAGNOSIS — N186 End stage renal disease: Secondary | ICD-10-CM | POA: Diagnosis not present

## 2017-12-23 DIAGNOSIS — Z992 Dependence on renal dialysis: Secondary | ICD-10-CM | POA: Diagnosis not present

## 2017-12-24 DIAGNOSIS — N186 End stage renal disease: Secondary | ICD-10-CM | POA: Diagnosis not present

## 2017-12-24 DIAGNOSIS — Z992 Dependence on renal dialysis: Secondary | ICD-10-CM | POA: Diagnosis not present

## 2017-12-25 DIAGNOSIS — N186 End stage renal disease: Secondary | ICD-10-CM | POA: Diagnosis not present

## 2017-12-25 DIAGNOSIS — Z992 Dependence on renal dialysis: Secondary | ICD-10-CM | POA: Diagnosis not present

## 2017-12-26 DIAGNOSIS — Z992 Dependence on renal dialysis: Secondary | ICD-10-CM | POA: Diagnosis not present

## 2017-12-26 DIAGNOSIS — N186 End stage renal disease: Secondary | ICD-10-CM | POA: Diagnosis not present

## 2017-12-27 DIAGNOSIS — N186 End stage renal disease: Secondary | ICD-10-CM | POA: Diagnosis not present

## 2017-12-27 DIAGNOSIS — Z992 Dependence on renal dialysis: Secondary | ICD-10-CM | POA: Diagnosis not present

## 2017-12-28 DIAGNOSIS — Z992 Dependence on renal dialysis: Secondary | ICD-10-CM | POA: Diagnosis not present

## 2017-12-28 DIAGNOSIS — N186 End stage renal disease: Secondary | ICD-10-CM | POA: Diagnosis not present

## 2017-12-29 DIAGNOSIS — Z992 Dependence on renal dialysis: Secondary | ICD-10-CM | POA: Diagnosis not present

## 2017-12-29 DIAGNOSIS — N186 End stage renal disease: Secondary | ICD-10-CM | POA: Diagnosis not present

## 2017-12-30 DIAGNOSIS — Z992 Dependence on renal dialysis: Secondary | ICD-10-CM | POA: Diagnosis not present

## 2017-12-30 DIAGNOSIS — N186 End stage renal disease: Secondary | ICD-10-CM | POA: Diagnosis not present

## 2017-12-31 DIAGNOSIS — Z992 Dependence on renal dialysis: Secondary | ICD-10-CM | POA: Diagnosis not present

## 2017-12-31 DIAGNOSIS — N186 End stage renal disease: Secondary | ICD-10-CM | POA: Diagnosis not present

## 2018-01-01 DIAGNOSIS — N186 End stage renal disease: Secondary | ICD-10-CM | POA: Diagnosis not present

## 2018-01-01 DIAGNOSIS — Z992 Dependence on renal dialysis: Secondary | ICD-10-CM | POA: Diagnosis not present

## 2018-01-02 DIAGNOSIS — N186 End stage renal disease: Secondary | ICD-10-CM | POA: Diagnosis not present

## 2018-01-02 DIAGNOSIS — Z992 Dependence on renal dialysis: Secondary | ICD-10-CM | POA: Diagnosis not present

## 2018-01-03 DIAGNOSIS — Z992 Dependence on renal dialysis: Secondary | ICD-10-CM | POA: Diagnosis not present

## 2018-01-03 DIAGNOSIS — N186 End stage renal disease: Secondary | ICD-10-CM | POA: Diagnosis not present

## 2018-01-04 DIAGNOSIS — Z992 Dependence on renal dialysis: Secondary | ICD-10-CM | POA: Diagnosis not present

## 2018-01-04 DIAGNOSIS — N186 End stage renal disease: Secondary | ICD-10-CM | POA: Diagnosis not present

## 2018-01-05 DIAGNOSIS — Z992 Dependence on renal dialysis: Secondary | ICD-10-CM | POA: Diagnosis not present

## 2018-01-05 DIAGNOSIS — N186 End stage renal disease: Secondary | ICD-10-CM | POA: Diagnosis not present

## 2018-01-06 DIAGNOSIS — N186 End stage renal disease: Secondary | ICD-10-CM | POA: Diagnosis not present

## 2018-01-06 DIAGNOSIS — Z992 Dependence on renal dialysis: Secondary | ICD-10-CM | POA: Diagnosis not present

## 2018-01-07 ENCOUNTER — Ambulatory Visit: Payer: Medicare Other | Admitting: Family Medicine

## 2018-01-07 DIAGNOSIS — N186 End stage renal disease: Secondary | ICD-10-CM | POA: Diagnosis not present

## 2018-01-07 DIAGNOSIS — Z992 Dependence on renal dialysis: Secondary | ICD-10-CM | POA: Diagnosis not present

## 2018-01-08 DIAGNOSIS — Z992 Dependence on renal dialysis: Secondary | ICD-10-CM | POA: Diagnosis not present

## 2018-01-08 DIAGNOSIS — N186 End stage renal disease: Secondary | ICD-10-CM | POA: Diagnosis not present

## 2018-01-09 DIAGNOSIS — N186 End stage renal disease: Secondary | ICD-10-CM | POA: Diagnosis not present

## 2018-01-09 DIAGNOSIS — Z992 Dependence on renal dialysis: Secondary | ICD-10-CM | POA: Diagnosis not present

## 2018-01-10 ENCOUNTER — Ambulatory Visit (INDEPENDENT_AMBULATORY_CARE_PROVIDER_SITE_OTHER): Payer: 59 | Admitting: Internal Medicine

## 2018-01-10 ENCOUNTER — Encounter: Payer: Self-pay | Admitting: Internal Medicine

## 2018-01-10 ENCOUNTER — Ambulatory Visit: Payer: 59 | Admitting: Internal Medicine

## 2018-01-10 VITALS — BP 122/80 | HR 69 | Resp 16 | Ht 71.0 in | Wt 188.0 lb

## 2018-01-10 DIAGNOSIS — Z992 Dependence on renal dialysis: Secondary | ICD-10-CM | POA: Diagnosis not present

## 2018-01-10 DIAGNOSIS — E7800 Pure hypercholesterolemia, unspecified: Secondary | ICD-10-CM | POA: Diagnosis not present

## 2018-01-10 DIAGNOSIS — I2510 Atherosclerotic heart disease of native coronary artery without angina pectoris: Secondary | ICD-10-CM

## 2018-01-10 DIAGNOSIS — N186 End stage renal disease: Secondary | ICD-10-CM | POA: Diagnosis not present

## 2018-01-10 DIAGNOSIS — J4520 Mild intermittent asthma, uncomplicated: Secondary | ICD-10-CM | POA: Diagnosis not present

## 2018-01-10 NOTE — Progress Notes (Signed)
[place] [facility] Consultation     Date: 01/10/2018,   MRN# [id number] Joseph [name] [facility] [birth year]    AdmissionWeight: 188 lb (85.3 kg)                 CurrentWeight: 188 lb (85.3 kg)  Synopsis:  Dx of Amyloidosis ESRD on HD with chronic cough -patient with previous h/o hemorrhagic left sided effusion with h/o heavy smoking PFT's: 07/07/15 ratio 86% FEV1 85%, 6MWT WNL DLCO 44%   CC: follow up SOB, cough, COPD  HPI:  doing well overall, uses albuterol inhaler infrequently No sob, chest pain No signs of infection at this time Had RT LOWER lobe pneumonia 01/2017 on CT chest Repeat CT chest 04/2017 showed complete resolution of pneumonia  Patient is on list for kidney transplant at 3 centers     Current Medication:   Current Outpatient Medications:  .  Aflibercept (EYLEA) 2 MG/0.05ML SOLN, 1 Dose by Intravitreal route every 30 (thirty) days., Disp: , Rfl:  .  albuterol (PROVENTIL HFA;VENTOLIN HFA) 108 (90 Base) MCG/ACT inhaler, Inhale 2 puffs into the lungs every 6 (six) hours as needed for wheezing or shortness of breath., Disp: 1 Inhaler, Rfl: 2 .  atorvastatin (LIPITOR) 20 MG tablet, TAKE 1 TABLET BY MOUTH ONCE DAILY, Disp: 90 tablet, Rfl: 3 .  cycloSPORINE (RESTASIS) 0.05 % ophthalmic emulsion, Place 1 drop into both eyes daily. , Disp: , Rfl:  .  Epoetin Alfa (EPOGEN IJ), Inject as directed every 30 (thirty) days. Administered at Dialysis center., Disp: , Rfl:  .  folic acid (FOLVITE) 1 MG tablet, Take 5 mg by mouth daily., Disp: , Rfl:  .  gentamicin cream (GARAMYCIN) 0.1 %, Apply 1 application topically at bedtime. , Disp: , Rfl: 0 .  HYDROmorphone (DILAUDID) 2 MG tablet, Take 1 tablet (2 mg total) by mouth every 6 (six) hours as needed for severe pain., Disp: 30 tablet, Rfl: 0 .  Hypromellose (ARTIFICIAL TEARS OP), Apply 1 drop to eye as needed., Disp: , Rfl:  .  losartan (COZAAR) 100 MG tablet, Take 1 tablet (100 mg total) by mouth daily. (Patient taking  differently: Take 50 mg by mouth daily. ), Disp: 90 tablet, Rfl: 3 .  ondansetron (ZOFRAN-ODT) 4 MG disintegrating tablet, Take 1 tablet (4 mg total) by mouth every 8 (eight) hours as needed for nausea or vomiting., Disp: 30 tablet, Rfl: 0 .  oxyCODONE (OXYCONTIN) 10 mg 12 hr tablet, Take 5-10 mg by mouth every 12 (twelve) hours., Disp: , Rfl:  .  pantoprazole (PROTONIX) 40 MG tablet, Take 1 tablet (40 mg total) by mouth daily., Disp: 90 tablet, Rfl: 2 .  polyethylene glycol (MIRALAX / GLYCOLAX) packet, Take 17 g by mouth daily. (Patient taking differently: Take 17 g by mouth daily as needed. ), Disp: 14 each, Rfl: 0 .  VELTASSA 8.4 g packet, Take 1 packet by mouth daily., Disp: , Rfl: 3 .  ezetimibe (ZETIA) 10 MG tablet, Take 1 tablet (10 mg total) by mouth daily., Disp: 90 tablet, Rfl: 3     ALLERGIES   Ciprofloxacin hcl; Doxycycline hyclate; and Phenol-glycerin     REVIEW OF SYSTEMS   Review of Systems  Constitutional: Negative for chills, fever, malaise/fatigue and weight loss.  Respiratory: Negative for cough, shortness of breath and wheezing.   Cardiovascular: Negative for chest pain, orthopnea and leg swelling.  Gastrointestinal: Negative for abdominal pain and nausea.     VS: BP 122/80 (BP Location: Left Arm, Cuff Size: Normal)  Pulse 69   Resp 16   Ht 5\' 11"  (1.803 m)   Wt 188 lb (85.3 kg)   SpO2 100%   BMI 26.22 kg/m      PHYSICAL EXAM   Physical Exam  Constitutional: No distress.  Cardiovascular: Normal rate, regular rhythm and normal heart sounds.  No murmur heard. Pulmonary/Chest: Effort normal and breath sounds normal. No respiratory distress. He has no wheezes.  Abdominal: Soft. Bowel sounds are normal.  Surgical scars clean intact, PD catheter in place  Skin: He is not diaphoretic.          ASSESSMENT/PLAN   52 yo white male with new Dx of Amyloidosis ESRD on HD with chronic cough-resolved with h/o heavy smoking findings c/w very mild COPD  based on flow volume loops with slight concavity of end exp flow limitation-no acute complaints at this time  1.albuterol as needed 2.avoid second hand smokle 3.cough suppressants as needed   Follow up in 1 year.    Patient/Family are satisfied with Plan of action and management. All questions answered   Corrin Parker, M.D.  [facility]  Medical Director [place] Director [facility] Cardio-Pulmonary Department

## 2018-01-10 NOTE — Patient Instructions (Signed)
Albuterol as needed 

## 2018-01-11 DIAGNOSIS — Z992 Dependence on renal dialysis: Secondary | ICD-10-CM | POA: Diagnosis not present

## 2018-01-11 DIAGNOSIS — N186 End stage renal disease: Secondary | ICD-10-CM | POA: Diagnosis not present

## 2018-01-12 DIAGNOSIS — N186 End stage renal disease: Secondary | ICD-10-CM | POA: Diagnosis not present

## 2018-01-12 DIAGNOSIS — Z992 Dependence on renal dialysis: Secondary | ICD-10-CM | POA: Diagnosis not present

## 2018-01-13 DIAGNOSIS — N186 End stage renal disease: Secondary | ICD-10-CM | POA: Diagnosis not present

## 2018-01-13 DIAGNOSIS — Z992 Dependence on renal dialysis: Secondary | ICD-10-CM | POA: Diagnosis not present

## 2018-01-14 DIAGNOSIS — Z992 Dependence on renal dialysis: Secondary | ICD-10-CM | POA: Diagnosis not present

## 2018-01-14 DIAGNOSIS — N186 End stage renal disease: Secondary | ICD-10-CM | POA: Diagnosis not present

## 2018-01-15 DIAGNOSIS — Z992 Dependence on renal dialysis: Secondary | ICD-10-CM | POA: Diagnosis not present

## 2018-01-15 DIAGNOSIS — N186 End stage renal disease: Secondary | ICD-10-CM | POA: Diagnosis not present

## 2018-01-16 DIAGNOSIS — Z992 Dependence on renal dialysis: Secondary | ICD-10-CM | POA: Diagnosis not present

## 2018-01-16 DIAGNOSIS — N186 End stage renal disease: Secondary | ICD-10-CM | POA: Diagnosis not present

## 2018-01-17 DIAGNOSIS — N186 End stage renal disease: Secondary | ICD-10-CM | POA: Diagnosis not present

## 2018-01-17 DIAGNOSIS — Z992 Dependence on renal dialysis: Secondary | ICD-10-CM | POA: Diagnosis not present

## 2018-01-18 DIAGNOSIS — N186 End stage renal disease: Secondary | ICD-10-CM | POA: Diagnosis not present

## 2018-01-18 DIAGNOSIS — Z992 Dependence on renal dialysis: Secondary | ICD-10-CM | POA: Diagnosis not present

## 2018-01-19 DIAGNOSIS — N186 End stage renal disease: Secondary | ICD-10-CM | POA: Diagnosis not present

## 2018-01-19 DIAGNOSIS — Z992 Dependence on renal dialysis: Secondary | ICD-10-CM | POA: Diagnosis not present

## 2018-01-20 DIAGNOSIS — N186 End stage renal disease: Secondary | ICD-10-CM | POA: Diagnosis not present

## 2018-01-20 DIAGNOSIS — Z992 Dependence on renal dialysis: Secondary | ICD-10-CM | POA: Diagnosis not present

## 2018-01-21 DIAGNOSIS — Z992 Dependence on renal dialysis: Secondary | ICD-10-CM | POA: Diagnosis not present

## 2018-01-21 DIAGNOSIS — N186 End stage renal disease: Secondary | ICD-10-CM | POA: Diagnosis not present

## 2018-01-22 DIAGNOSIS — N186 End stage renal disease: Secondary | ICD-10-CM | POA: Diagnosis not present

## 2018-01-22 DIAGNOSIS — Z992 Dependence on renal dialysis: Secondary | ICD-10-CM | POA: Diagnosis not present

## 2018-01-23 DIAGNOSIS — Z992 Dependence on renal dialysis: Secondary | ICD-10-CM | POA: Diagnosis not present

## 2018-01-23 DIAGNOSIS — N186 End stage renal disease: Secondary | ICD-10-CM | POA: Diagnosis not present

## 2018-01-24 DIAGNOSIS — N186 End stage renal disease: Secondary | ICD-10-CM | POA: Diagnosis not present

## 2018-01-24 DIAGNOSIS — Z992 Dependence on renal dialysis: Secondary | ICD-10-CM | POA: Diagnosis not present

## 2018-01-24 DIAGNOSIS — D631 Anemia in chronic kidney disease: Secondary | ICD-10-CM | POA: Diagnosis not present

## 2018-01-24 DIAGNOSIS — D509 Iron deficiency anemia, unspecified: Secondary | ICD-10-CM | POA: Diagnosis not present

## 2018-01-25 DIAGNOSIS — N186 End stage renal disease: Secondary | ICD-10-CM | POA: Diagnosis not present

## 2018-01-25 DIAGNOSIS — D509 Iron deficiency anemia, unspecified: Secondary | ICD-10-CM | POA: Diagnosis not present

## 2018-01-25 DIAGNOSIS — D631 Anemia in chronic kidney disease: Secondary | ICD-10-CM | POA: Diagnosis not present

## 2018-01-25 DIAGNOSIS — Z992 Dependence on renal dialysis: Secondary | ICD-10-CM | POA: Diagnosis not present

## 2018-01-26 DIAGNOSIS — D631 Anemia in chronic kidney disease: Secondary | ICD-10-CM | POA: Diagnosis not present

## 2018-01-26 DIAGNOSIS — D509 Iron deficiency anemia, unspecified: Secondary | ICD-10-CM | POA: Diagnosis not present

## 2018-01-26 DIAGNOSIS — N186 End stage renal disease: Secondary | ICD-10-CM | POA: Diagnosis not present

## 2018-01-26 DIAGNOSIS — Z992 Dependence on renal dialysis: Secondary | ICD-10-CM | POA: Diagnosis not present

## 2018-01-27 DIAGNOSIS — D509 Iron deficiency anemia, unspecified: Secondary | ICD-10-CM | POA: Diagnosis not present

## 2018-01-27 DIAGNOSIS — D631 Anemia in chronic kidney disease: Secondary | ICD-10-CM | POA: Diagnosis not present

## 2018-01-27 DIAGNOSIS — N186 End stage renal disease: Secondary | ICD-10-CM | POA: Diagnosis not present

## 2018-01-27 DIAGNOSIS — Z992 Dependence on renal dialysis: Secondary | ICD-10-CM | POA: Diagnosis not present

## 2018-01-28 DIAGNOSIS — N186 End stage renal disease: Secondary | ICD-10-CM | POA: Diagnosis not present

## 2018-01-28 DIAGNOSIS — D509 Iron deficiency anemia, unspecified: Secondary | ICD-10-CM | POA: Diagnosis not present

## 2018-01-28 DIAGNOSIS — Z992 Dependence on renal dialysis: Secondary | ICD-10-CM | POA: Diagnosis not present

## 2018-01-28 DIAGNOSIS — D631 Anemia in chronic kidney disease: Secondary | ICD-10-CM | POA: Diagnosis not present

## 2018-01-29 DIAGNOSIS — Z992 Dependence on renal dialysis: Secondary | ICD-10-CM | POA: Diagnosis not present

## 2018-01-29 DIAGNOSIS — D509 Iron deficiency anemia, unspecified: Secondary | ICD-10-CM | POA: Diagnosis not present

## 2018-01-29 DIAGNOSIS — N186 End stage renal disease: Secondary | ICD-10-CM | POA: Diagnosis not present

## 2018-01-29 DIAGNOSIS — D631 Anemia in chronic kidney disease: Secondary | ICD-10-CM | POA: Diagnosis not present

## 2018-01-30 DIAGNOSIS — N186 End stage renal disease: Secondary | ICD-10-CM | POA: Diagnosis not present

## 2018-01-30 DIAGNOSIS — D631 Anemia in chronic kidney disease: Secondary | ICD-10-CM | POA: Diagnosis not present

## 2018-01-30 DIAGNOSIS — Z992 Dependence on renal dialysis: Secondary | ICD-10-CM | POA: Diagnosis not present

## 2018-01-30 DIAGNOSIS — D509 Iron deficiency anemia, unspecified: Secondary | ICD-10-CM | POA: Diagnosis not present

## 2018-01-31 DIAGNOSIS — N186 End stage renal disease: Secondary | ICD-10-CM | POA: Diagnosis not present

## 2018-01-31 DIAGNOSIS — D631 Anemia in chronic kidney disease: Secondary | ICD-10-CM | POA: Diagnosis not present

## 2018-01-31 DIAGNOSIS — Z992 Dependence on renal dialysis: Secondary | ICD-10-CM | POA: Diagnosis not present

## 2018-01-31 DIAGNOSIS — D509 Iron deficiency anemia, unspecified: Secondary | ICD-10-CM | POA: Diagnosis not present

## 2018-02-01 DIAGNOSIS — D631 Anemia in chronic kidney disease: Secondary | ICD-10-CM | POA: Diagnosis not present

## 2018-02-01 DIAGNOSIS — D509 Iron deficiency anemia, unspecified: Secondary | ICD-10-CM | POA: Diagnosis not present

## 2018-02-01 DIAGNOSIS — N186 End stage renal disease: Secondary | ICD-10-CM | POA: Diagnosis not present

## 2018-02-01 DIAGNOSIS — Z992 Dependence on renal dialysis: Secondary | ICD-10-CM | POA: Diagnosis not present

## 2018-02-02 DIAGNOSIS — D509 Iron deficiency anemia, unspecified: Secondary | ICD-10-CM | POA: Diagnosis not present

## 2018-02-02 DIAGNOSIS — N186 End stage renal disease: Secondary | ICD-10-CM | POA: Diagnosis not present

## 2018-02-02 DIAGNOSIS — Z992 Dependence on renal dialysis: Secondary | ICD-10-CM | POA: Diagnosis not present

## 2018-02-02 DIAGNOSIS — D631 Anemia in chronic kidney disease: Secondary | ICD-10-CM | POA: Diagnosis not present

## 2018-02-03 DIAGNOSIS — D509 Iron deficiency anemia, unspecified: Secondary | ICD-10-CM | POA: Diagnosis not present

## 2018-02-03 DIAGNOSIS — Z992 Dependence on renal dialysis: Secondary | ICD-10-CM | POA: Diagnosis not present

## 2018-02-03 DIAGNOSIS — N186 End stage renal disease: Secondary | ICD-10-CM | POA: Diagnosis not present

## 2018-02-03 DIAGNOSIS — D631 Anemia in chronic kidney disease: Secondary | ICD-10-CM | POA: Diagnosis not present

## 2018-02-04 DIAGNOSIS — D631 Anemia in chronic kidney disease: Secondary | ICD-10-CM | POA: Diagnosis not present

## 2018-02-04 DIAGNOSIS — Z992 Dependence on renal dialysis: Secondary | ICD-10-CM | POA: Diagnosis not present

## 2018-02-04 DIAGNOSIS — D509 Iron deficiency anemia, unspecified: Secondary | ICD-10-CM | POA: Diagnosis not present

## 2018-02-04 DIAGNOSIS — N186 End stage renal disease: Secondary | ICD-10-CM | POA: Diagnosis not present

## 2018-02-05 DIAGNOSIS — D631 Anemia in chronic kidney disease: Secondary | ICD-10-CM | POA: Diagnosis not present

## 2018-02-05 DIAGNOSIS — D509 Iron deficiency anemia, unspecified: Secondary | ICD-10-CM | POA: Diagnosis not present

## 2018-02-05 DIAGNOSIS — N186 End stage renal disease: Secondary | ICD-10-CM | POA: Diagnosis not present

## 2018-02-05 DIAGNOSIS — Z992 Dependence on renal dialysis: Secondary | ICD-10-CM | POA: Diagnosis not present

## 2018-02-06 DIAGNOSIS — D509 Iron deficiency anemia, unspecified: Secondary | ICD-10-CM | POA: Diagnosis not present

## 2018-02-06 DIAGNOSIS — Z992 Dependence on renal dialysis: Secondary | ICD-10-CM | POA: Diagnosis not present

## 2018-02-06 DIAGNOSIS — D631 Anemia in chronic kidney disease: Secondary | ICD-10-CM | POA: Diagnosis not present

## 2018-02-06 DIAGNOSIS — N186 End stage renal disease: Secondary | ICD-10-CM | POA: Diagnosis not present

## 2018-02-07 DIAGNOSIS — D631 Anemia in chronic kidney disease: Secondary | ICD-10-CM | POA: Diagnosis not present

## 2018-02-07 DIAGNOSIS — Z992 Dependence on renal dialysis: Secondary | ICD-10-CM | POA: Diagnosis not present

## 2018-02-07 DIAGNOSIS — D509 Iron deficiency anemia, unspecified: Secondary | ICD-10-CM | POA: Diagnosis not present

## 2018-02-07 DIAGNOSIS — N186 End stage renal disease: Secondary | ICD-10-CM | POA: Diagnosis not present

## 2018-02-08 DIAGNOSIS — D631 Anemia in chronic kidney disease: Secondary | ICD-10-CM | POA: Diagnosis not present

## 2018-02-08 DIAGNOSIS — Z992 Dependence on renal dialysis: Secondary | ICD-10-CM | POA: Diagnosis not present

## 2018-02-08 DIAGNOSIS — N186 End stage renal disease: Secondary | ICD-10-CM | POA: Diagnosis not present

## 2018-02-08 DIAGNOSIS — D509 Iron deficiency anemia, unspecified: Secondary | ICD-10-CM | POA: Diagnosis not present

## 2018-02-09 DIAGNOSIS — Z992 Dependence on renal dialysis: Secondary | ICD-10-CM | POA: Diagnosis not present

## 2018-02-09 DIAGNOSIS — N186 End stage renal disease: Secondary | ICD-10-CM | POA: Diagnosis not present

## 2018-02-09 DIAGNOSIS — D509 Iron deficiency anemia, unspecified: Secondary | ICD-10-CM | POA: Diagnosis not present

## 2018-02-09 DIAGNOSIS — D631 Anemia in chronic kidney disease: Secondary | ICD-10-CM | POA: Diagnosis not present

## 2018-02-10 DIAGNOSIS — D631 Anemia in chronic kidney disease: Secondary | ICD-10-CM | POA: Diagnosis not present

## 2018-02-10 DIAGNOSIS — N186 End stage renal disease: Secondary | ICD-10-CM | POA: Diagnosis not present

## 2018-02-10 DIAGNOSIS — D509 Iron deficiency anemia, unspecified: Secondary | ICD-10-CM | POA: Diagnosis not present

## 2018-02-10 DIAGNOSIS — Z992 Dependence on renal dialysis: Secondary | ICD-10-CM | POA: Diagnosis not present

## 2018-02-11 DIAGNOSIS — Z992 Dependence on renal dialysis: Secondary | ICD-10-CM | POA: Diagnosis not present

## 2018-02-11 DIAGNOSIS — D631 Anemia in chronic kidney disease: Secondary | ICD-10-CM | POA: Diagnosis not present

## 2018-02-11 DIAGNOSIS — N186 End stage renal disease: Secondary | ICD-10-CM | POA: Diagnosis not present

## 2018-02-11 DIAGNOSIS — D509 Iron deficiency anemia, unspecified: Secondary | ICD-10-CM | POA: Diagnosis not present

## 2018-02-12 DIAGNOSIS — D631 Anemia in chronic kidney disease: Secondary | ICD-10-CM | POA: Diagnosis not present

## 2018-02-12 DIAGNOSIS — Z992 Dependence on renal dialysis: Secondary | ICD-10-CM | POA: Diagnosis not present

## 2018-02-12 DIAGNOSIS — D509 Iron deficiency anemia, unspecified: Secondary | ICD-10-CM | POA: Diagnosis not present

## 2018-02-12 DIAGNOSIS — N186 End stage renal disease: Secondary | ICD-10-CM | POA: Diagnosis not present

## 2018-02-13 DIAGNOSIS — D631 Anemia in chronic kidney disease: Secondary | ICD-10-CM | POA: Diagnosis not present

## 2018-02-13 DIAGNOSIS — D509 Iron deficiency anemia, unspecified: Secondary | ICD-10-CM | POA: Diagnosis not present

## 2018-02-13 DIAGNOSIS — Z992 Dependence on renal dialysis: Secondary | ICD-10-CM | POA: Diagnosis not present

## 2018-02-13 DIAGNOSIS — N186 End stage renal disease: Secondary | ICD-10-CM | POA: Diagnosis not present

## 2018-02-14 DIAGNOSIS — N186 End stage renal disease: Secondary | ICD-10-CM | POA: Diagnosis not present

## 2018-02-14 DIAGNOSIS — D631 Anemia in chronic kidney disease: Secondary | ICD-10-CM | POA: Diagnosis not present

## 2018-02-14 DIAGNOSIS — D509 Iron deficiency anemia, unspecified: Secondary | ICD-10-CM | POA: Diagnosis not present

## 2018-02-14 DIAGNOSIS — Z992 Dependence on renal dialysis: Secondary | ICD-10-CM | POA: Diagnosis not present

## 2018-02-15 DIAGNOSIS — D509 Iron deficiency anemia, unspecified: Secondary | ICD-10-CM | POA: Diagnosis not present

## 2018-02-15 DIAGNOSIS — Z992 Dependence on renal dialysis: Secondary | ICD-10-CM | POA: Diagnosis not present

## 2018-02-15 DIAGNOSIS — D631 Anemia in chronic kidney disease: Secondary | ICD-10-CM | POA: Diagnosis not present

## 2018-02-15 DIAGNOSIS — N186 End stage renal disease: Secondary | ICD-10-CM | POA: Diagnosis not present

## 2018-02-15 DIAGNOSIS — H26491 Other secondary cataract, right eye: Secondary | ICD-10-CM | POA: Diagnosis not present

## 2018-02-16 DIAGNOSIS — Z992 Dependence on renal dialysis: Secondary | ICD-10-CM | POA: Diagnosis not present

## 2018-02-16 DIAGNOSIS — N186 End stage renal disease: Secondary | ICD-10-CM | POA: Diagnosis not present

## 2018-02-16 DIAGNOSIS — D631 Anemia in chronic kidney disease: Secondary | ICD-10-CM | POA: Diagnosis not present

## 2018-02-16 DIAGNOSIS — D509 Iron deficiency anemia, unspecified: Secondary | ICD-10-CM | POA: Diagnosis not present

## 2018-02-17 DIAGNOSIS — Z992 Dependence on renal dialysis: Secondary | ICD-10-CM | POA: Diagnosis not present

## 2018-02-17 DIAGNOSIS — D509 Iron deficiency anemia, unspecified: Secondary | ICD-10-CM | POA: Diagnosis not present

## 2018-02-17 DIAGNOSIS — D631 Anemia in chronic kidney disease: Secondary | ICD-10-CM | POA: Diagnosis not present

## 2018-02-17 DIAGNOSIS — N186 End stage renal disease: Secondary | ICD-10-CM | POA: Diagnosis not present

## 2018-02-18 DIAGNOSIS — D509 Iron deficiency anemia, unspecified: Secondary | ICD-10-CM | POA: Diagnosis not present

## 2018-02-18 DIAGNOSIS — Z992 Dependence on renal dialysis: Secondary | ICD-10-CM | POA: Diagnosis not present

## 2018-02-18 DIAGNOSIS — D631 Anemia in chronic kidney disease: Secondary | ICD-10-CM | POA: Diagnosis not present

## 2018-02-18 DIAGNOSIS — N186 End stage renal disease: Secondary | ICD-10-CM | POA: Diagnosis not present

## 2018-02-19 DIAGNOSIS — D631 Anemia in chronic kidney disease: Secondary | ICD-10-CM | POA: Diagnosis not present

## 2018-02-19 DIAGNOSIS — N186 End stage renal disease: Secondary | ICD-10-CM | POA: Diagnosis not present

## 2018-02-19 DIAGNOSIS — Z992 Dependence on renal dialysis: Secondary | ICD-10-CM | POA: Diagnosis not present

## 2018-02-19 DIAGNOSIS — D509 Iron deficiency anemia, unspecified: Secondary | ICD-10-CM | POA: Diagnosis not present

## 2018-02-20 DIAGNOSIS — D509 Iron deficiency anemia, unspecified: Secondary | ICD-10-CM | POA: Diagnosis not present

## 2018-02-20 DIAGNOSIS — D631 Anemia in chronic kidney disease: Secondary | ICD-10-CM | POA: Diagnosis not present

## 2018-02-20 DIAGNOSIS — Z992 Dependence on renal dialysis: Secondary | ICD-10-CM | POA: Diagnosis not present

## 2018-02-20 DIAGNOSIS — N186 End stage renal disease: Secondary | ICD-10-CM | POA: Diagnosis not present

## 2018-02-21 DIAGNOSIS — Z992 Dependence on renal dialysis: Secondary | ICD-10-CM | POA: Diagnosis not present

## 2018-02-21 DIAGNOSIS — D631 Anemia in chronic kidney disease: Secondary | ICD-10-CM | POA: Diagnosis not present

## 2018-02-21 DIAGNOSIS — N186 End stage renal disease: Secondary | ICD-10-CM | POA: Diagnosis not present

## 2018-02-21 DIAGNOSIS — D509 Iron deficiency anemia, unspecified: Secondary | ICD-10-CM | POA: Diagnosis not present

## 2018-02-22 DIAGNOSIS — N186 End stage renal disease: Secondary | ICD-10-CM | POA: Diagnosis not present

## 2018-02-22 DIAGNOSIS — D509 Iron deficiency anemia, unspecified: Secondary | ICD-10-CM | POA: Diagnosis not present

## 2018-02-22 DIAGNOSIS — Z992 Dependence on renal dialysis: Secondary | ICD-10-CM | POA: Diagnosis not present

## 2018-02-22 DIAGNOSIS — D631 Anemia in chronic kidney disease: Secondary | ICD-10-CM | POA: Diagnosis not present

## 2018-02-23 DIAGNOSIS — Z992 Dependence on renal dialysis: Secondary | ICD-10-CM | POA: Diagnosis not present

## 2018-02-23 DIAGNOSIS — D631 Anemia in chronic kidney disease: Secondary | ICD-10-CM | POA: Diagnosis not present

## 2018-02-23 DIAGNOSIS — D509 Iron deficiency anemia, unspecified: Secondary | ICD-10-CM | POA: Diagnosis not present

## 2018-02-23 DIAGNOSIS — N186 End stage renal disease: Secondary | ICD-10-CM | POA: Diagnosis not present

## 2018-02-24 DIAGNOSIS — Z992 Dependence on renal dialysis: Secondary | ICD-10-CM | POA: Diagnosis not present

## 2018-02-24 DIAGNOSIS — N186 End stage renal disease: Secondary | ICD-10-CM | POA: Diagnosis not present

## 2018-02-25 DIAGNOSIS — N186 End stage renal disease: Secondary | ICD-10-CM | POA: Diagnosis not present

## 2018-02-25 DIAGNOSIS — Z992 Dependence on renal dialysis: Secondary | ICD-10-CM | POA: Diagnosis not present

## 2018-02-26 DIAGNOSIS — N186 End stage renal disease: Secondary | ICD-10-CM | POA: Diagnosis not present

## 2018-02-26 DIAGNOSIS — Z992 Dependence on renal dialysis: Secondary | ICD-10-CM | POA: Diagnosis not present

## 2018-02-27 DIAGNOSIS — Z992 Dependence on renal dialysis: Secondary | ICD-10-CM | POA: Diagnosis not present

## 2018-02-27 DIAGNOSIS — N186 End stage renal disease: Secondary | ICD-10-CM | POA: Diagnosis not present

## 2018-02-28 DIAGNOSIS — N186 End stage renal disease: Secondary | ICD-10-CM | POA: Diagnosis not present

## 2018-02-28 DIAGNOSIS — Z992 Dependence on renal dialysis: Secondary | ICD-10-CM | POA: Diagnosis not present

## 2018-03-01 DIAGNOSIS — Z992 Dependence on renal dialysis: Secondary | ICD-10-CM | POA: Diagnosis not present

## 2018-03-01 DIAGNOSIS — N186 End stage renal disease: Secondary | ICD-10-CM | POA: Diagnosis not present

## 2018-03-02 DIAGNOSIS — Z992 Dependence on renal dialysis: Secondary | ICD-10-CM | POA: Diagnosis not present

## 2018-03-02 DIAGNOSIS — N186 End stage renal disease: Secondary | ICD-10-CM | POA: Diagnosis not present

## 2018-03-03 DIAGNOSIS — Z992 Dependence on renal dialysis: Secondary | ICD-10-CM | POA: Diagnosis not present

## 2018-03-03 DIAGNOSIS — N186 End stage renal disease: Secondary | ICD-10-CM | POA: Diagnosis not present

## 2018-03-04 DIAGNOSIS — N186 End stage renal disease: Secondary | ICD-10-CM | POA: Diagnosis not present

## 2018-03-04 DIAGNOSIS — Z992 Dependence on renal dialysis: Secondary | ICD-10-CM | POA: Diagnosis not present

## 2018-03-05 DIAGNOSIS — Z992 Dependence on renal dialysis: Secondary | ICD-10-CM | POA: Diagnosis not present

## 2018-03-05 DIAGNOSIS — N186 End stage renal disease: Secondary | ICD-10-CM | POA: Diagnosis not present

## 2018-03-06 DIAGNOSIS — N186 End stage renal disease: Secondary | ICD-10-CM | POA: Diagnosis not present

## 2018-03-06 DIAGNOSIS — Z992 Dependence on renal dialysis: Secondary | ICD-10-CM | POA: Diagnosis not present

## 2018-03-07 ENCOUNTER — Ambulatory Visit: Payer: Medicare Other | Admitting: Physician Assistant

## 2018-03-07 ENCOUNTER — Emergency Department: Payer: Medicare Other

## 2018-03-07 ENCOUNTER — Other Ambulatory Visit: Payer: Self-pay

## 2018-03-07 ENCOUNTER — Inpatient Hospital Stay
Admission: EM | Admit: 2018-03-07 | Discharge: 2018-03-12 | DRG: 417 | Disposition: A | Discharge status: Home / Self Care | Payer: Medicare Other | Attending: Surgery | Admitting: Surgery

## 2018-03-07 DIAGNOSIS — E875 Hyperkalemia: Secondary | ICD-10-CM | POA: Diagnosis present

## 2018-03-07 DIAGNOSIS — D631 Anemia in chronic kidney disease: Secondary | ICD-10-CM | POA: Diagnosis present

## 2018-03-07 DIAGNOSIS — K219 Gastro-esophageal reflux disease without esophagitis: Secondary | ICD-10-CM | POA: Diagnosis present

## 2018-03-07 DIAGNOSIS — Z888 Allergy status to other drugs, medicaments and biological substances status: Secondary | ICD-10-CM

## 2018-03-07 DIAGNOSIS — Z01818 Encounter for other preprocedural examination: Secondary | ICD-10-CM | POA: Diagnosis not present

## 2018-03-07 DIAGNOSIS — E859 Amyloidosis, unspecified: Secondary | ICD-10-CM | POA: Diagnosis present

## 2018-03-07 DIAGNOSIS — N186 End stage renal disease: Secondary | ICD-10-CM | POA: Diagnosis present

## 2018-03-07 DIAGNOSIS — K8012 Calculus of gallbladder with acute and chronic cholecystitis without obstruction: Secondary | ICD-10-CM | POA: Diagnosis not present

## 2018-03-07 DIAGNOSIS — K432 Incisional hernia without obstruction or gangrene: Secondary | ICD-10-CM | POA: Diagnosis present

## 2018-03-07 DIAGNOSIS — K439 Ventral hernia without obstruction or gangrene: Secondary | ICD-10-CM | POA: Diagnosis not present

## 2018-03-07 DIAGNOSIS — Z87891 Personal history of nicotine dependence: Secondary | ICD-10-CM

## 2018-03-07 DIAGNOSIS — I120 Hypertensive chronic kidney disease with stage 5 chronic kidney disease or end stage renal disease: Secondary | ICD-10-CM | POA: Diagnosis present

## 2018-03-07 DIAGNOSIS — E785 Hyperlipidemia, unspecified: Secondary | ICD-10-CM | POA: Diagnosis present

## 2018-03-07 DIAGNOSIS — Z79899 Other long term (current) drug therapy: Secondary | ICD-10-CM | POA: Diagnosis not present

## 2018-03-07 DIAGNOSIS — Z8249 Family history of ischemic heart disease and other diseases of the circulatory system: Secondary | ICD-10-CM | POA: Diagnosis not present

## 2018-03-07 DIAGNOSIS — Z95828 Presence of other vascular implants and grafts: Secondary | ICD-10-CM

## 2018-03-07 DIAGNOSIS — R1011 Right upper quadrant pain: Secondary | ICD-10-CM | POA: Diagnosis not present

## 2018-03-07 DIAGNOSIS — J9811 Atelectasis: Secondary | ICD-10-CM | POA: Diagnosis not present

## 2018-03-07 DIAGNOSIS — K429 Umbilical hernia without obstruction or gangrene: Secondary | ICD-10-CM | POA: Diagnosis not present

## 2018-03-07 DIAGNOSIS — I10 Essential (primary) hypertension: Secondary | ICD-10-CM | POA: Diagnosis not present

## 2018-03-07 DIAGNOSIS — N2581 Secondary hyperparathyroidism of renal origin: Secondary | ICD-10-CM | POA: Diagnosis present

## 2018-03-07 DIAGNOSIS — Z992 Dependence on renal dialysis: Secondary | ICD-10-CM

## 2018-03-07 DIAGNOSIS — N19 Unspecified kidney failure: Secondary | ICD-10-CM | POA: Diagnosis not present

## 2018-03-07 DIAGNOSIS — K819 Cholecystitis, unspecified: Secondary | ICD-10-CM | POA: Diagnosis not present

## 2018-03-07 DIAGNOSIS — T82868A Thrombosis of vascular prosthetic devices, implants and grafts, initial encounter: Secondary | ICD-10-CM | POA: Diagnosis not present

## 2018-03-07 DIAGNOSIS — K810 Acute cholecystitis: Principal | ICD-10-CM

## 2018-03-07 DIAGNOSIS — J449 Chronic obstructive pulmonary disease, unspecified: Secondary | ICD-10-CM | POA: Diagnosis not present

## 2018-03-07 DIAGNOSIS — K8020 Calculus of gallbladder without cholecystitis without obstruction: Secondary | ICD-10-CM | POA: Diagnosis not present

## 2018-03-07 LAB — COMPREHENSIVE METABOLIC PANEL
ALBUMIN: 3.2 g/dL — AB (ref 3.5–5.0)
ALK PHOS: 190 U/L — AB (ref 38–126)
ALT: 31 U/L (ref 0–44)
AST: 26 U/L (ref 15–41)
Anion gap: 11 (ref 5–15)
BILIRUBIN TOTAL: 0.6 mg/dL (ref 0.3–1.2)
BUN: 61 mg/dL — ABNORMAL HIGH (ref 6–20)
CALCIUM: 8.7 mg/dL — AB (ref 8.9–10.3)
CO2: 25 mmol/L (ref 22–32)
Chloride: 102 mmol/L (ref 98–111)
Creatinine, Ser: 6.18 mg/dL — ABNORMAL HIGH (ref 0.61–1.24)
GFR calc Af Amer: 11 mL/min — ABNORMAL LOW (ref >60)
GFR calc non Af Amer: 9 mL/min — ABNORMAL LOW (ref >60)
GLUCOSE: 104 mg/dL — AB (ref 70–99)
Potassium: 4.6 mmol/L (ref 3.5–5.1)
Sodium: 138 mmol/L (ref 135–145)
TOTAL PROTEIN: 7.3 g/dL (ref 6.5–8.1)

## 2018-03-07 LAB — CBC WITH DIFFERENTIAL/PLATELET
BASOS ABS: 0.1 10*3/uL (ref 0–0.1)
BASOS PCT: 1 %
Eosinophils Absolute: 0.6 10*3/uL (ref 0–0.7)
Eosinophils Relative: 5 %
HCT: 36.1 % — ABNORMAL LOW (ref 40.0–52.0)
Hemoglobin: 12 g/dL — ABNORMAL LOW (ref 13.0–18.0)
Lymphocytes Relative: 22 %
Lymphs Abs: 2.7 10*3/uL (ref 1.0–3.6)
MCH: 30.4 pg (ref 26.0–34.0)
MCHC: 33.4 g/dL (ref 32.0–36.0)
MCV: 91.2 fL (ref 80.0–100.0)
MONO ABS: 0.9 10*3/uL (ref 0.2–1.0)
MONOS PCT: 7 %
Neutro Abs: 8.3 10*3/uL — ABNORMAL HIGH (ref 1.4–6.5)
Neutrophils Relative %: 65 %
PLATELETS: 286 10*3/uL (ref 150–440)
RBC: 3.96 MIL/uL — ABNORMAL LOW (ref 4.40–5.90)
RDW: 16.3 % — AB (ref 11.5–14.5)
WBC: 12.6 10*3/uL — ABNORMAL HIGH (ref 3.8–10.6)

## 2018-03-07 LAB — BODY FLUID CELL COUNT WITH DIFFERENTIAL
Eos, Fluid: 0 %
LYMPHS FL: 3 %
MONOCYTE-MACROPHAGE-SEROUS FLUID: 75 %
Neutrophil Count, Fluid: 22 %
Total Nucleated Cell Count, Fluid: 407 cu mm

## 2018-03-07 MED ORDER — OXYCODONE HCL 5 MG PO TABS
5.0000 mg | ORAL_TABLET | ORAL | Status: DC | PRN
  Administered 2018-03-08 (×2): 5 mg via ORAL
  Administered 2018-03-08 – 2018-03-09 (×3): 10 mg via ORAL
  Filled 2018-03-07: qty 2
  Filled 2018-03-07: qty 1
  Filled 2018-03-07 (×2): qty 2
  Filled 2018-03-07: qty 1

## 2018-03-07 MED ORDER — HEPARIN SODIUM (PORCINE) 5000 UNIT/ML IJ SOLN
5000.0000 [IU] | Freq: Three times a day (TID) | INTRAMUSCULAR | Status: DC
  Filled 2018-03-07 (×4): qty 1

## 2018-03-07 MED ORDER — PATIROMER SORBITEX CALCIUM 8.4 G PO PACK
1.0000 | PACK | Freq: Every day | ORAL | Status: DC
  Administered 2018-03-09 – 2018-03-10 (×2): 1 via ORAL
  Filled 2018-03-07 (×5): qty 1

## 2018-03-07 MED ORDER — HYDRALAZINE HCL 20 MG/ML IJ SOLN: 10.0000 mg | INTRAMUSCULAR | Status: DC | PRN

## 2018-03-07 MED ORDER — IOPAMIDOL (ISOVUE-300) INJECTION 61%: 100.0000 mL | Freq: Once | INTRAVENOUS | Status: DC | PRN

## 2018-03-07 MED ORDER — ONDANSETRON 4 MG PO TBDP
4.0000 mg | ORAL_TABLET | Freq: Four times a day (QID) | ORAL | Status: DC | PRN
  Filled 2018-03-07: qty 1

## 2018-03-07 MED ORDER — POLYVINYL ALCOHOL 1.4 % OP SOLN
1.0000 [drp] | OPHTHALMIC | Status: DC | PRN
  Filled 2018-03-07: qty 15

## 2018-03-07 MED ORDER — PIPERACILLIN-TAZOBACTAM 3.375 G IVPB
3.3750 g | Freq: Two times a day (BID) | INTRAVENOUS | Status: DC
  Administered 2018-03-07 – 2018-03-11 (×9): 3.375 g via INTRAVENOUS
  Filled 2018-03-07 (×9): qty 50

## 2018-03-07 MED ORDER — ALBUTEROL SULFATE (2.5 MG/3ML) 0.083% IN NEBU
2.5000 mg | INHALATION_SOLUTION | Freq: Four times a day (QID) | RESPIRATORY_TRACT | Status: DC | PRN
Start: 2018-03-07 — End: 2018-03-12

## 2018-03-07 MED ORDER — CYCLOSPORINE 0.05 % OP EMUL
1.0000 [drp] | Freq: Every day | OPHTHALMIC | Status: DC
  Administered 2018-03-08 – 2018-03-11 (×4): 1 [drp] via OPHTHALMIC
  Filled 2018-03-07 (×5): qty 1

## 2018-03-07 MED ORDER — ONDANSETRON HCL 4 MG/2ML IJ SOLN: 4.0000 mg | Freq: Four times a day (QID) | INTRAMUSCULAR | Status: DC | PRN

## 2018-03-07 MED ORDER — FOLIC ACID 1 MG PO TABS
5.0000 mg | ORAL_TABLET | Freq: Every day | ORAL | Status: DC
  Administered 2018-03-09 – 2018-03-12 (×3): 5 mg via ORAL
  Filled 2018-03-07 (×3): qty 5

## 2018-03-07 MED ORDER — EZETIMIBE 10 MG PO TABS
10.0000 mg | ORAL_TABLET | Freq: Every day | ORAL | Status: DC
  Administered 2018-03-09 – 2018-03-12 (×3): 10 mg via ORAL
  Filled 2018-03-07 (×5): qty 1

## 2018-03-07 MED ORDER — HYDROMORPHONE HCL 2 MG PO TABS
2.0000 mg | ORAL_TABLET | Freq: Four times a day (QID) | ORAL | Status: DC | PRN
  Administered 2018-03-07 – 2018-03-12 (×6): 2 mg via ORAL
  Filled 2018-03-07 (×6): qty 1

## 2018-03-07 MED ORDER — DEXTROSE-NACL 5-0.9 % IV SOLN
INTRAVENOUS | Status: DC
  Administered 2018-03-07 – 2018-03-10 (×4): via INTRAVENOUS

## 2018-03-07 MED ORDER — FAMOTIDINE IN NACL 20-0.9 MG/50ML-% IV SOLN
20.0000 mg | Freq: Two times a day (BID) | INTRAVENOUS | Status: DC
  Administered 2018-03-07: 20 mg via INTRAVENOUS
  Filled 2018-03-07: qty 50

## 2018-03-07 MED ORDER — PIPERACILLIN-TAZOBACTAM IN DEX 2-0.25 GM/50ML IV SOLN: 2.2500 g | Freq: Once | INTRAVENOUS | Status: DC

## 2018-03-07 NOTE — ED Provider Notes (Signed)
[facility] Emergency Department Provider Note  ____________________________________________   I have reviewed the triage vital signs and the nursing notes. Where available I have reviewed prior notes and, if possible and indicated, outside hospital notes.    HISTORY  Chief Complaint Hernia    HPI SAMIE REASONS is a 52 y.o. male who presents today complaining of vague abdominal pain low-grade fevers.  Patient does have peritoneal dialysis.  He does have a incisional hernia around his umbilicus which he states he noticed about a week ago which has been out.  He thought that it might be causing the abdominal discomfort.  He denies any vomiting or fever.  Did feel little nausea but has not vomited.  Normal bowel movements.  No rash or other source of infection no cough or shortness of breath.  T-max was 37.9 Celsius.  Patient states the pain is mostly around the umbilical region and some in the left lower abdomen.  Patient is on peritoneal dialysis and has been having no cloudy diastyle or other complications of that.    Past Medical History:  Diagnosis Date  . Amyloidosis ([place])   . Blood clot in abdominal vein    happened around age 52 due to traumatic injury  . Blood dyscrasia    amyloidosis  . Central serous chorioretinopathy   . Dry eye   . GERD (gastroesophageal reflux disease)   . Heart murmur    never followed up with this, no problems  . Hyperlipidemia   . Hypertension    improved since starting dialysis  . Pneumonia    as child  . Renal insufficiency     Patient Active Problem List   Diagnosis Date Noted  . Pseudophakia, both eyes 11/13/2017  . Intractable back pain 09/23/2017  . Coronary artery calcification seen on CT scan 09/11/2017  . Chest pain 08/06/2017  . Splenomegaly 06/05/2017  . Sepsis ([place]) 01/29/2017  . Right lower lobe pneumonia ([place]) 01/29/2017  . ESRD on peritoneal dialysis ([place]) 09/12/2015  . Central serous  chorioretinopathy of both eyes 09/12/2015  . Hyperlipidemia 09/12/2015  . GERD (gastroesophageal reflux disease) 09/12/2015  . Hepatitis B vaccination not up to date 09/12/2015  . Renal amyloidosis ([place]) 08/10/2015  . Retinal edema 07/13/2015  . COPD (chronic obstructive pulmonary disease) ([place]) 06/02/2015  . Perinephric hematoma 04/07/2015  . Essential hypertension 04/06/2015    Past Surgical History:  Procedure Laterality Date  . AORTOGRAM Bilateral 04/11/2015   Procedure: Aortogram;  Surgeon: Katha Cabal, MD;  Location: [place] [facility];  Service: Cardiovascular;  Laterality: Bilateral;  . CAPD INSERTION N/A 07/09/2015   Procedure: LAPAROSCOPIC INSERTION CONTINUOUS AMBULATORY PERITONEAL DIALYSIS  (CAPD) CATHETER;  Surgeon: Katha Cabal, MD;  Location: [facility] ORS;  Service: Vascular;  Laterality: N/A;  . COLONOSCOPY WITH PROPOFOL N/A 01/11/2016   Procedure: COLONOSCOPY WITH PROPOFOL;  Surgeon: [name], MD;  Location: [facility] ENDOSCOPY;  Service: Endoscopy;  Laterality: N/A;  . EXPLORATORY LAPAROTOMY     done to find and remove abdominal blood clot as a teenager  . IMPLANTATION / PLACEMENT PERMANENT EPIDURAL CATHETER Right 2016  . PERIPHERAL VASCULAR CATHETERIZATION Left 04/11/2015   Procedure: Embolization;  Surgeon: Katha Cabal, MD;  Location: [place] [facility];  Service: Cardiovascular;  Laterality: Left;  . PERIPHERAL VASCULAR CATHETERIZATION N/A 04/16/2015   Procedure: Dialysis/Perma Catheter Insertion;  Surgeon: Katha Cabal, MD;  Location: [place] [facility];  Service: Cardiovascular;  Laterality: N/A;  . PERIPHERAL VASCULAR CATHETERIZATION  N/A 08/24/2015   Procedure: Dialysis/Perma Catheter Removal;  Surgeon: Katha Cabal, MD;  Location: [place] [facility];  Service: Cardiovascular;  Laterality: N/A;  . RENAL BIOPSY, PERCUTANEOUS  04/06/2015      . TONSILLECTOMY      Prior to Admission medications   Medication Sig Start Date End Date  Taking? Authorizing Provider  Aflibercept (EYLEA) 2 MG/0.05ML SOLN 1 Dose by Intravitreal route every 30 (thirty) days.    [provider]  albuterol (PROVENTIL HFA;VENTOLIN HFA) 108 (90 Base) MCG/ACT inhaler Inhale 2 puffs into the lungs every 6 (six) hours as needed for wheezing or shortness of breath. 05/24/17   Flora Lipps, MD  atorvastatin (LIPITOR) 20 MG tablet TAKE 1 TABLET BY MOUTH ONCE DAILY 08/20/17   Alycia Rossetti, MD  cycloSPORINE (RESTASIS) 0.05 % ophthalmic emulsion Place 1 drop into both eyes daily.     [provider]  Epoetin Alfa (EPOGEN IJ) Inject as directed every 30 (thirty) days. Administered at Dialysis center.    [provider]  ezetimibe (ZETIA) 10 MG tablet Take 1 tablet (10 mg total) by mouth daily. 09/11/17 12/10/17  Minna Merritts, MD  folic acid (FOLVITE) 1 MG tablet Take 5 mg by mouth daily. 03/14/16   [provider]  gentamicin cream (GARAMYCIN) 0.1 % Apply 1 application topically at bedtime.  12/31/15   [provider]  HYDROmorphone (DILAUDID) 2 MG tablet Take 1 tablet (2 mg total) by mouth every 6 (six) hours as needed for severe pain. 08/10/17   Alycia Rossetti, MD  Hypromellose (ARTIFICIAL TEARS OP) Apply 1 drop to eye as needed.    [provider]  losartan (COZAAR) 100 MG tablet Take 1 tablet (100 mg total) by mouth daily. Patient taking differently: Take 50 mg by mouth daily.  11/16/15   [place], Modena Nunnery, MD  ondansetron (ZOFRAN-ODT) 4 MG disintegrating tablet Take 1 tablet (4 mg total) by mouth every 8 (eight) hours as needed for nausea or vomiting. 04/21/15   Vaughan Basta, MD  oxyCODONE (OXYCONTIN) 10 mg 12 hr tablet Take 5-10 mg by mouth every 12 (twelve) hours.    [provider]  pantoprazole (PROTONIX) 40 MG tablet Take 1 tablet (40 mg total) by mouth daily. 09/04/17   [place], Modena Nunnery, MD  polyethylene glycol [facility] / Floria Raveling) packet Take 17 g by mouth daily. Patient taking  differently: Take 17 g by mouth daily as needed.  08/10/17   Gladstone Lighter, MD  VELTASSA 8.4 g packet Take 1 packet by mouth daily. 12/20/15   [provider]    Allergies Ciprofloxacin hcl; Doxycycline hyclate; and Phenol-glycerin  Family History  Problem Relation Age of Onset  . Hypertension Mother   . Birth defects Sister   . Multiple sclerosis Sister   . Heart disease Maternal Uncle   . Early death Maternal Uncle   . Heart attack Maternal Uncle   . Hypertension Other   . Heart attack Maternal Uncle   . Heart disease Maternal Uncle     Social History Social History   Tobacco Use  . Smoking status: Former Smoker    Packs/day: 0.50    Years: 20.00    Pack years: 10.00    Types: Cigarettes    Last attempt to quit: 11/04/2006    Years since quitting: 11.3  . Smokeless tobacco: Never Used  Substance Use Topics  . Alcohol use: No  . Drug use: No    Review of Systems Constitutional:  No fever/chills Eyes: No visual changes. ENT: No sore throat. No stiff neck no neck pain Cardiovascular: Denies chest pain. Respiratory: Denies shortness of breath. Gastrointestinal:   no vomiting.  No diarrhea.  No constipation. Genitourinary: Negative for dysuria. Musculoskeletal: Negative lower extremity swelling Skin: Negative for rash. Neurological: Negative for severe headaches, focal weakness or numbness.   ____________________________________________   PHYSICAL EXAM:  VITAL SIGNS: ED Triage Vitals  Enc Vitals Group     BP 03/07/18 1216 126/85     Pulse Rate 03/07/18 1215 85     Resp 03/07/18 1215 16     Temp 03/07/18 1215 98.1 F (36.7 C)     Temp Source 03/07/18 1215 Oral     SpO2 03/07/18 1215 99 %     Weight 03/07/18 1216 190 lb (86.2 kg)     Height 03/07/18 1216 5\' 11"  (1.803 m)     Head Circumference --      Peak Flow --      Pain Score 03/07/18 1216 4     Pain Loc --      Pain Edu? --      Excl. in [place]? --     Constitutional: Alert and  oriented. Well appearing and in no acute distress. Eyes: Conjunctivae are normal Head: Atraumatic HEENT: No congestion/rhinnorhea. Mucous membranes are moist.  Oropharynx non-erythematous Neck:   Nontender with no meningismus, no masses, no stridor Cardiovascular: Normal rate, regular rhythm. Grossly normal heart sounds.  Good peripheral circulation. Respiratory: Normal respiratory effort.  No retractions. Lungs CTAB. Abdominal: Soft and helical hernia, very small noted.  Not indurated not erythematous.  Gentle pressure did cause it to retract.. No distention. No guarding no rebound there is tenderness to palpation in the left lower quadrant, mildly, otherwise no acute pathology noted Back:  There is no focal tenderness or step off.  there is no midline tenderness there are no lesions noted. there is no CVA tenderness Musculoskeletal: No lower extremity tenderness, no upper extremity tenderness. No joint effusions, no DVT signs strong distal pulses no edema Neurologic:  Normal speech and language. No gross focal neurologic deficits are appreciated.  Skin:  Skin is warm, dry and intact. No rash noted. Psychiatric: Mood and affect are normal. Speech and behavior are normal.  ____________________________________________   LABS (all labs ordered are listed, but only abnormal results are displayed)  Labs Reviewed  COMPREHENSIVE METABOLIC PANEL - Abnormal; Notable for the following components:      Result Value   Glucose, Bld 104 (*)    BUN 61 (*)    Creatinine, Ser 6.18 (*)    Calcium 8.7 (*)    Albumin 3.2 (*)    Alkaline Phosphatase 190 (*)    GFR calc non Af Amer 9 (*)    GFR calc Af Amer 11 (*)    All other components within normal limits  CBC WITH DIFFERENTIAL/PLATELET - Abnormal; Notable for the following components:   WBC 12.6 (*)    RBC 3.96 (*)    Hemoglobin 12.0 (*)    HCT 36.1 (*)    RDW 16.3 (*)    Neutro Abs 8.3 (*)    All other components within normal limits  CULTURE,  BLOOD (ROUTINE X 2)  CULTURE, BLOOD (ROUTINE X 2)  URINE CULTURE  BODY FLUID CULTURE  URINALYSIS, COMPLETE (UACMP) WITH MICROSCOPIC    Pertinent labs  results that were available during my care of the patient were reviewed by me and considered in my medical  decision making (see chart for details). ____________________________________________  EKG  I personally interpreted any EKGs ordered by me or triage  ____________________________________________  RADIOLOGY  Pertinent labs & imaging results that were available during my care of the patient were reviewed by me and considered in my medical decision making (see chart for details). If possible, patient and/or family made aware of any abnormal findings.  No results found. ____________________________________________    PROCEDURES  Procedure(s) performed: None  Procedures  Critical Care performed: None  ____________________________________________   INITIAL IMPRESSION / ASSESSMENT AND PLAN / ED COURSE  Pertinent labs & imaging results that were available during my care of the patient were reviewed by me and considered in my medical decision making (see chart for details).  Patient with peritoneal dialysis here with abdominal pain and nausea and feeling generally unwell with low-grade fevers.  Will obtain CT scan as diverticulitis is certainly a possibility, it could be something absolutely unrelated to his dialysis.  We will send cultures we will also send culture off his diastolate.  Blood work is reassuring.  It is probable that we will give the patient IV antibiotics and discharge if CT scan is okay but we will discuss with nephrology.  Signed out at the end of my shift to Dr. Quentin Cornwall    ____________________________________________   FINAL CLINICAL IMPRESSION(S) / ED DIAGNOSES  Final diagnoses:  None      This chart was dictated using voice recognition software.  Despite best efforts to proofread,  errors can  occur which can change meaning.      Schuyler Amor, MD 03/07/18 [phone]

## 2018-03-07 NOTE — ED Notes (Signed)
Pt to dialysis at this time. [place] will take pt once returned from dialysis.

## 2018-03-07 NOTE — Consult Note (Signed)
Patient ID: Joseph [name], male   DOB: Sep 09, 1965, 52 y.o.   MRN: [id number]  HPI Joseph [name] is a 52 y.o. male the history of amyloidosis and end-stage renal disease on peritoneal dialysis.  He comes in today for symptomatic ventral hernia that was reduced but the patient also complains of 4-day history of abdominal pain nausea and vomiting.  Some decreased appetite.  No evidence of biliary obstruction no evidence of cholangitis.  No fevers no chills.  He reports that he is got intermittent pain in the right upper quadrant worsening with certain meals.  There is no specific alleviating factors.  He was on a transplant list of multiple centers in both her [place] and [place] and have been advised for cholecystectomy due to gallstones.  Work-up in the ER including a CT scan and ultrasound that I have personally reviewed show evidence of cholecystitis with gallstones.  Normal common bile duct. White count is mildly elevated and LFTs are normal other than mild elevation of the alkaline phosphatase. He Is able to perform more than 4 METS of activity without any shortness of breath or chest pain  HPI  Past Medical History:  Diagnosis Date  . Amyloidosis ([place])   . Blood clot in abdominal vein    happened around age 58 due to traumatic injury  . Blood dyscrasia    amyloidosis  . Central serous chorioretinopathy   . Dry eye   . GERD (gastroesophageal reflux disease)   . Heart murmur    never followed up with this, no problems  . Hyperlipidemia   . Hypertension    improved since starting dialysis  . Pneumonia    as child  . Renal insufficiency     Past Surgical History:  Procedure Laterality Date  . AORTOGRAM Bilateral 04/11/2015   Procedure: Aortogram;  Surgeon: Katha Cabal, MD;  Location: [place] [facility];  Service: Cardiovascular;  Laterality: Bilateral;  . CAPD INSERTION N/A 07/09/2015   Procedure: LAPAROSCOPIC INSERTION CONTINUOUS AMBULATORY PERITONEAL DIALYSIS  (CAPD)  CATHETER;  Surgeon: Katha Cabal, MD;  Location: [facility];  Service: Vascular;  Laterality: N/A;  . COLONOSCOPY WITH PROPOFOL N/A 01/11/2016   Procedure: COLONOSCOPY WITH PROPOFOL;  Surgeon: [name], MD;  Location: [facility];  Service: Endoscopy;  Laterality: N/A;  . EXPLORATORY LAPAROTOMY     done to find and remove abdominal blood clot as a teenager  . IMPLANTATION / PLACEMENT PERMANENT EPIDURAL CATHETER Right 2016  . PERIPHERAL VASCULAR CATHETERIZATION Left 04/11/2015   Procedure: Embolization;  Surgeon: Katha Cabal, MD;  Location: [place] [facility];  Service: Cardiovascular;  Laterality: Left;  . PERIPHERAL VASCULAR CATHETERIZATION N/A 04/16/2015   Procedure: Dialysis/Perma Catheter Insertion;  Surgeon: Katha Cabal, MD;  Location: [place] [facility];  Service: Cardiovascular;  Laterality: N/A;  . PERIPHERAL VASCULAR CATHETERIZATION N/A 08/24/2015   Procedure: Dialysis/Perma Catheter Removal;  Surgeon: Katha Cabal, MD;  Location: [place] [facility];  Service: Cardiovascular;  Laterality: N/A;  . RENAL BIOPSY, PERCUTANEOUS  04/06/2015      . TONSILLECTOMY      Family History  Problem Relation Age of Onset  . Hypertension Mother   . Birth defects Sister   . Multiple sclerosis Sister   . Heart disease Maternal Uncle   . Early death Maternal Uncle   . Heart attack Maternal Uncle   . Hypertension Other   . Heart attack Maternal Uncle   . Heart disease Maternal Uncle  Social History Social History   Tobacco Use  . Smoking status: Former Smoker    Packs/day: 0.50    Years: 20.00    Pack years: 10.00    Types: Cigarettes    Last attempt to quit: 11/04/2006    Years since quitting: 11.3  . Smokeless tobacco: Never Used  Substance Use Topics  . Alcohol use: No  . Drug use: No    Allergies  Allergen Reactions  . Ciprofloxacin Hcl Swelling    High fever  . Doxycycline Hyclate Swelling  . Phenol-Glycerin Swelling    Current  Facility-Administered Medications  Medication Dose Route Frequency Provider Last Rate Last Dose  . iopamidol (ISOVUE-300) 61 % injection 100 mL  100 mL Intravenous Once PRN Schuyler Amor, MD      . piperacillin-tazobactam (ZOSYN) IVPB 2.25 g  2.25 g Intravenous Once Merlyn Lot, MD       Current Outpatient Medications  Medication Sig Dispense Refill  . Aflibercept (EYLEA) 2 MG/0.05ML SOLN 1 Dose by Intravitreal route every 30 (thirty) days.    Marland Kitchen albuterol (PROVENTIL HFA;VENTOLIN HFA) 108 (90 Base) MCG/ACT inhaler Inhale 2 puffs into the lungs every 6 (six) hours as needed for wheezing or shortness of breath. 1 Inhaler 2  . atorvastatin (LIPITOR) 20 MG tablet TAKE 1 TABLET BY MOUTH ONCE DAILY 90 tablet 3  . cycloSPORINE (RESTASIS) 0.05 % ophthalmic emulsion Place 1 drop into both eyes daily.     Marland Kitchen Epoetin Alfa (EPOGEN IJ) Inject as directed every 30 (thirty) days. Administered at Dialysis center.    . ezetimibe (ZETIA) 10 MG tablet Take 1 tablet (10 mg total) by mouth daily. 90 tablet 3  . folic acid (FOLVITE) 1 MG tablet Take 5 mg by mouth daily.    Marland Kitchen gentamicin cream (GARAMYCIN) 0.1 % Apply 1 application topically at bedtime.   0  . HYDROmorphone (DILAUDID) 2 MG tablet Take 1 tablet (2 mg total) by mouth every 6 (six) hours as needed for severe pain. 30 tablet 0  . Hypromellose (ARTIFICIAL TEARS OP) Apply 1 drop to eye as needed.    Marland Kitchen losartan (COZAAR) 100 MG tablet Take 1 tablet (100 mg total) by mouth daily. (Patient taking differently: Take 50 mg by mouth daily. ) 90 tablet 3  . ondansetron (ZOFRAN-ODT) 4 MG disintegrating tablet Take 1 tablet (4 mg total) by mouth every 8 (eight) hours as needed for nausea or vomiting. 30 tablet 0  . oxyCODONE (OXYCONTIN) 10 mg 12 hr tablet Take 5-10 mg by mouth every 12 (twelve) hours.    . pantoprazole (PROTONIX) 40 MG tablet Take 1 tablet (40 mg total) by mouth daily. 90 tablet 2  . polyethylene glycol (MIRALAX / GLYCOLAX) packet Take 17 g by  mouth daily. (Patient taking differently: Take 17 g by mouth daily as needed. ) 14 each 0  . VELTASSA 8.4 g packet Take 1 packet by mouth daily.  3     Review of Systems Full ROS  was asked and was negative except for the information on the HPI  Physical Exam Blood pressure 122/78, pulse 76, temperature 98.1 F (36.7 C), temperature source Oral, resp. rate 16, height 5\' 11"  (1.803 m), weight 86.2 kg, SpO2 96 %. CONSTITUTIONAL: NAD EYES: Pupils are equal, round, and reactive to light, Sclera are non-icteric. EARS, NOSE, MOUTH AND THROAT: The oropharynx is clear. The oral mucosa is pink and moist. Hearing is intact to voice. LYMPH NODES:  Lymph nodes in the neck are normal.  RESPIRATORY:  Lungs are clear. There is normal respiratory effort, with equal breath sounds bilaterally, and without pathologic use of accessory muscles. CARDIOVASCULAR: Heart is regular without murmurs, gallops, or rubs. GI: The abdomen is  soft, TTP RUQ, + MUrphy. No peritonitis, PD cath in place w./o infection. Reducible ventral hernia. Previous lower paramedian laparotomy scar GU: Rectal deferred.   MUSCULOSKELETAL: Normal muscle strength and tone. No cyanosis or edema.   SKIN: Turgor is good and there are no pathologic skin lesions or ulcers. NEUROLOGIC: Motor and sensation is grossly normal. Cranial nerves are grossly intact. PSYCH:  Oriented to person, place and time. Affect is normal.  Data Reviewed  I have personally reviewed the patient's imaging, laboratory findings and medical records.    Assessment/Plan  52 year old male with symptoms consistent with acute cholecystitis on a patient with end-stage renal disease on PD catheter.  Has no contraindication for cholecystectomy.  Discussed with the patient in detail extensively about options of medical management versus surgical management given the fact that he does have a symptomatic ventral hernia and the fact that he is got acute cholecystitis I do think that  the best course of treatment will be surgical.  As well as has evaluated him and determined that he is a low risk for any perioperative morbidity and mortality.  Now the question will be more than likely he will need a short course of hemodialysis given that we are repairing the hernia and were doing an abdominal operation.  We will touch base with nephrology and vascular surgery regarding this issue. The risks, benefits, complications, treatment options, and expected outcomes were discussed with the patient. The possibilities of bleeding, recurrent infection, finding a normal gallbladder, perforation of viscus organs, damage to surrounding structures, bile leak, abscess formation, needing a drain placed, the need for additional procedures, reaction to medication, pulmonary aspiration,  failure to diagnose a condition, the possible need to convert to an open procedure, and creating a complication requiring transfusion or operation were discussed with the patient. The patient and/or family concurred with the proposed plan, giving informed consent.  Plan for lap chole in am. Statrt IV A/Bs and nephrology consult. Caroleen Hamman, MD FACS General Surgeon 03/07/2018, 8:49 PM

## 2018-03-07 NOTE — ED Notes (Signed)
Per lab, send 2nd set of cultures even though blue is not filled to fill line.

## 2018-03-07 NOTE — ED Notes (Signed)
Pt aware of delay in dialysis as another pt is down there now. Will take pt down for body fluid culture as soon as dialysis is available.

## 2018-03-07 NOTE — Progress Notes (Signed)
Pharmacy Antibiotic Note  RUTLEDGE SELSOR is a 52 y.o. male admitted on 03/07/2018 with intra abdominal infection .  Pharmacy has been consulted for Zosyn  dosing.  Plan: Zosyn 3.375 gm IV Q12H EI ordered.   Height: 5\' 11"  (180.3 cm) Weight: 190 lb (86.2 kg) IBW/kg (Calculated) : 75.3  Temp (24hrs), Avg:98.1 F (36.7 C), Min:98.1 F (36.7 C), Max:98.1 F (36.7 C)  Recent Labs  Lab 03/07/18 1218  WBC 12.6*  CREATININE 6.18*    Estimated Creatinine Clearance: 14.6 mL/min (A) (by C-G formula based on SCr of 6.18 mg/dL (H)).    Allergies  Allergen Reactions  . Ciprofloxacin Hcl Swelling    High fever  . Doxycycline Hyclate Swelling  . Phenol-Glycerin Swelling    Antimicrobials this admission:   >>    >>   Dose adjustments this admission:   Microbiology results:  BCx:   UCx:    Sputum:    MRSA PCR:   Thank you for allowing pharmacy to be a part of this patient's care.  Diasia Henken D 03/07/2018 9:01 PM

## 2018-03-07 NOTE — Consult Note (Signed)
Medical Consultation  [facility] QIW:[id number] DOB: 08-11-1965 DOA: 03/07/2018 PCP: Alycia Rossetti, MD   Requesting physician: dr Peyton Najjar Date of consultation: March 07, 2018 Reason for consultation: Preoperative clearance  Impression/Recommendations  52 year old male who is on peritoneal dialysis who presents with abdominal pain and found to have acute cholecystitis.  1.  Preoperative clearance: Patient is low risk for moderate risk procedure may proceed to surgery without further cardiac intervention.  2.  End-stage renal disease on peritoneal dialysis: Due to acute infection Dr. Zollie Scale says he will hold peritoneal dialysis today  3.  Acute cholecystitis: Management as per surgery and    Chief Complaint: Abdominal pain  HPI:   52 year old male who presents with right upper quadrant abdominal pain.  CT shows acute cholecystitis.  ER physician spoke with surgeon who will admit patient and hospitalist service was asked to consult for preoperative clearance.  Patient denies chest pain, shortness of breath, nausea or vomiting.  Patient has had low-grade fever since yesterday.   Review of Systems  Constitutional: Grade fever no weight loss or chills HENT: Negative for ear pain, nosebleeds, congestion, facial swelling, rhinorrhea, neck pain, neck stiffness and ear discharge.   Respiratory: Negative for cough, shortness of breath, wheezing  Cardiovascular: Negative for chest pain, palpitations and leg swelling.  Gastrointestinal: As it of right upper quadrant abdominal pain without nausea or vomiting no diarrhea genitourinary: Negative for dysuria, urgency, frequency, hematuria Musculoskeletal: Negative for back pain or joint pain Neurological: Negative for dizziness, seizures, syncope, focal weakness,  numbness and headaches.  Hematological: Does not bruise/bleed easily.  Psychiatric/Behavioral: Negative for hallucinations, confusion, dysphoric mood   Past Medical History:   Diagnosis Date  . Amyloidosis ([place])   . Blood clot in abdominal vein    happened around age 32 due to traumatic injury  . Blood dyscrasia    amyloidosis  . Central serous chorioretinopathy   . Dry eye   . GERD (gastroesophageal reflux disease)   . Heart murmur    never followed up with this, no problems  . Hyperlipidemia   . Hypertension    improved since starting dialysis  . Pneumonia    as child  . Renal insufficiency    Past Surgical History:  Procedure Laterality Date  . AORTOGRAM Bilateral 04/11/2015   Procedure: Aortogram;  Surgeon: Katha Cabal, MD;  Location: [place] [facility];  Service: Cardiovascular;  Laterality: Bilateral;  . CAPD INSERTION N/A 07/09/2015   Procedure: LAPAROSCOPIC INSERTION CONTINUOUS AMBULATORY PERITONEAL DIALYSIS  (CAPD) CATHETER;  Surgeon: Katha Cabal, MD;  Location: [facility];  Service: Vascular;  Laterality: N/A;  . COLONOSCOPY WITH PROPOFOL N/A 01/11/2016   Procedure: COLONOSCOPY WITH PROPOFOL;  Surgeon: [name], MD;  Location: [facility];  Service: Endoscopy;  Laterality: N/A;  . EXPLORATORY LAPAROTOMY     done to find and remove abdominal blood clot as a teenager  . IMPLANTATION / PLACEMENT PERMANENT EPIDURAL CATHETER Right 2016  . PERIPHERAL VASCULAR CATHETERIZATION Left 04/11/2015   Procedure: Embolization;  Surgeon: Katha Cabal, MD;  Location: [place] [facility];  Service: Cardiovascular;  Laterality: Left;  . PERIPHERAL VASCULAR CATHETERIZATION N/A 04/16/2015   Procedure: Dialysis/Perma Catheter Insertion;  Surgeon: Katha Cabal, MD;  Location: [place] [facility];  Service: Cardiovascular;  Laterality: N/A;  . PERIPHERAL VASCULAR CATHETERIZATION N/A 08/24/2015   Procedure: Dialysis/Perma Catheter Removal;  Surgeon: Katha Cabal, MD;  Location: [place] [facility];  Service: Cardiovascular;  Laterality: N/A;  . RENAL  BIOPSY, PERCUTANEOUS  04/06/2015      . TONSILLECTOMY     Social History:  reports  that he quit smoking about 11 years ago. His smoking use included cigarettes. He has a 10.00 pack-year smoking history. He has never used smokeless tobacco. He reports that he does not drink alcohol or use drugs.  Allergies  Allergen Reactions  . Ciprofloxacin Hcl Swelling    High fever  . Doxycycline Hyclate Swelling  . Phenol-Glycerin Swelling   Family History  Problem Relation Age of Onset  . Hypertension Mother   . Birth defects Sister   . Multiple sclerosis Sister   . Heart disease Maternal Uncle   . Early death Maternal Uncle   . Heart attack Maternal Uncle   . Hypertension Other   . Heart attack Maternal Uncle   . Heart disease Maternal Uncle     Prior to Admission medications   Medication Sig Start Date End Date Taking? Authorizing Provider  Aflibercept (EYLEA) 2 MG/0.05ML SOLN 1 Dose by Intravitreal route every 30 (thirty) days.   Yes [provider]  albuterol (PROVENTIL HFA;VENTOLIN HFA) 108 (90 Base) MCG/ACT inhaler Inhale 2 puffs into the lungs every 6 (six) hours as needed for wheezing or shortness of breath. 05/24/17  Yes Kasa, Maretta Bees, MD  atorvastatin (LIPITOR) 20 MG tablet TAKE 1 TABLET BY MOUTH ONCE DAILY 08/20/17  Yes Troutman, Modena Nunnery, MD  cycloSPORINE (RESTASIS) 0.05 % ophthalmic emulsion Place 1 drop into both eyes daily.    Yes [provider]  Epoetin Alfa (EPOGEN IJ) Inject as directed every 30 (thirty) days. Administered at Dialysis center.   Yes [provider]  ezetimibe (ZETIA) 10 MG tablet Take 1 tablet (10 mg total) by mouth daily. 09/11/17 03/07/18 Yes Gollan, Kathlene November, MD  folic acid (FOLVITE) 1 MG tablet Take 5 mg by mouth daily. 03/14/16  Yes [provider]  gentamicin cream (GARAMYCIN) 0.1 % Apply 1 application topically at bedtime.  12/31/15  Yes [provider]  HYDROmorphone (DILAUDID) 2 MG tablet Take 1 tablet (2 mg total) by mouth every 6 (six) hours as needed for severe pain. 08/10/17  Yes Antoine,  Modena Nunnery, MD  Hypromellose (ARTIFICIAL TEARS OP) Apply 1 drop to eye as needed.   Yes [provider]  losartan (COZAAR) 100 MG tablet Take 1 tablet (100 mg total) by mouth daily. Patient taking differently: Take 50 mg by mouth daily.  11/16/15  Yes Harrison, Modena Nunnery, MD  ondansetron (ZOFRAN-ODT) 4 MG disintegrating tablet Take 1 tablet (4 mg total) by mouth every 8 (eight) hours as needed for nausea or vomiting. 04/21/15  Yes Vaughan Basta, MD  oxyCODONE (OXYCONTIN) 10 mg 12 hr tablet Take 5-10 mg by mouth every 12 (twelve) hours.   Yes [provider]  pantoprazole (PROTONIX) 40 MG tablet Take 1 tablet (40 mg total) by mouth daily. 09/04/17  Yes [place], Modena Nunnery, MD  polyethylene glycol [facility] / GLYCOLAX) packet Take 17 g by mouth daily. Patient taking differently: Take 17 g by mouth daily as needed.  08/10/17  Yes Gladstone Lighter, MD  VELTASSA 8.4 g packet Take 1 packet by mouth daily. 12/20/15  Yes [provider]    Physical Exam: Blood pressure 122/78, pulse 76, temperature 98.1 F (36.7 C), temperature source Oral, resp. rate 16, height 5\' 11"  (1.803 m), weight 86.2 kg, SpO2 96 %. @VITALS2 @ [employer]   03/07/18 1216  Weight: 86.2 kg   No intake or output data in  the 24 hours ending 03/07/18 2011   Constitutional: Appears well-developed and well-nourished. No distress. HENT: Normocephalic. Marland Kitchen Oropharynx is clear and moist.  Eyes: Conjunctivae and EOM are normal. PERRLA, no scleral icterus.  Neck: Normal ROM. Neck supple. No JVD. No tracheal deviation. CVS: RRR, S1/S2 +, no murmurs, no gallops, no carotid bruit.  Pulmonary: Effort and breath sounds normal, no stridor, rhonchi, wheezes, rales.  Abdominal: Soft. BS +,  no distension, tenderness, rebound or guarding.  Musculoskeletal: Normal range of motion. No edema and no tenderness.  Neuro: Alert. CN 2-12 grossly intact. No focal deficits. Skin: Skin is warm and dry. No rash  noted. Psychiatric: Normal mood and affect.    Labs  Basic Metabolic Panel: Recent Labs  Lab 03/07/18 1218  NA 138  K 4.6  CL 102  CO2 25  GLUCOSE 104*  BUN 61*  CREATININE 6.18*  CALCIUM 8.7*   Liver Function Tests: Recent Labs  Lab 03/07/18 1218  AST 26  ALT 31  ALKPHOS 190*  BILITOT 0.6  PROT 7.3  ALBUMIN 3.2*   No results for input(s): LIPASE, AMYLASE in the last 168 hours.  CBC: Recent Labs  Lab 03/07/18 1218  WBC 12.6*  NEUTROABS 8.3*  HGB 12.0*  HCT 36.1*  MCV 91.2  PLT 286   Cardiac Enzymes: No results for input(s): CKTOTAL, CKMB, CKMBINDEX, TROPONINI in the last 168 hours. BNP: Invalid input(s): POCBNP CBG: No results for input(s): GLUCAP in the last 168 hours.  Radiological Exams: Ct Abdomen Pelvis Wo Contrast  Result Date: 03/07/2018 CLINICAL DATA:  Hernia, low-grade temperature, nausea and pain for 4 days. Renal failure. EXAM: CT ABDOMEN AND PELVIS WITHOUT CONTRAST TECHNIQUE: Multidetector CT imaging of the abdomen and pelvis was performed following the standard protocol without IV contrast. COMPARISON:  CT abdomen dated 09/23/2017. FINDINGS: Lower chest: No acute abnormality. Hepatobiliary: Gallbladder is moderately distended and there is probable gallbladder wall thickening and/or pericholecystic edema. Small stones are now seen at the expected level of the cystic duct (series 2, images 23 and 24), a new finding compared to the earlier CT of 09/23/2017. Several additional gallstones are again seen in the region of the gallbladder neck. No common bile duct or intrahepatic bile duct dilatation seen. No focal liver abnormality. Pancreas: Partially infiltrated with fat but otherwise unremarkable. Spleen: Normal in size without focal abnormality. Adrenals/Urinary Tract: RIGHT adrenal gland is decreased in size, consistent with expected evolution of the previous adrenal hematoma. LEFT adrenal gland appears stable. Embolization coils again noted at the LEFT  renal hilum/pelvis. RIGHT renal cyst is stable. No new renal abnormality. No renal stone or hydronephrosis. No ureteral or bladder calculi identified. Bladder appears normal. Stomach/Bowel: No dilated large or small bowel loops. No evidence of acute bowel wall inflammation. Mild diverticulosis of the sigmoid and descending colon but no focal inflammatory change to suggest acute diverticulitis. Stomach is unremarkable, partially decompressed. Appendix is not seen. Vascular/Lymphatic: Again noted are mildly prominent lymph nodes scattered throughout the retroperitoneum, not significantly changed for over 2 years indicating benignity. Scattered aortic atherosclerosis. Reproductive: Prostate is unremarkable. Other: Small amount of free fluid again noted adjacent to the liver. No abscess collection. No free intraperitoneal air. Peritoneal dialysis catheter appears stable in position with the tip positioned in the RIGHT lower quadrant. Stable small periumbilical abdominal wall hernia containing fat only. Musculoskeletal: No acute or suspicious osseous finding. IMPRESSION: 1. Findings highly suggestive of acute cholecystitis. Gallbladder is moderately distended with probable gallbladder wall thickening and/or pericholecystic edema. Associated  small stones are now appreciated at the level of the cystic duct, presumably obstructive. Additional small gallstones again noted in the gallbladder neck region. Consider RIGHT upper quadrant ultrasound and/or nuclear medicine HIDA scan for confirmation. 2. Small amount of free fluid in the RIGHT upper quadrant. No abscess collection seen. 3. Colonic diverticulosis without evidence of acute diverticulitis. 4. Stable small periumbilical abdominal wall hernia, containing mesenteric fat only. No herniated bowel. 5.  Aortic Atherosclerosis (ICD10-I70.0). 6. Additional chronic/incidental findings detailed above. Electronically Signed   By: Franki Cabot M.D.   On: 03/07/2018 15:32   US  Abdomen Limited Ruq  Result Date: 03/07/2018 CLINICAL DATA:  Right upper quadrant pain EXAM: ULTRASOUND ABDOMEN LIMITED RIGHT UPPER QUADRANT COMPARISON:  CT scan from earlier today. FINDINGS: Gallbladder: Multiple gallstones identified, measuring up to 9 mm. Gallbladder wall is thickened at 4 mm. Striated gallbladder wall appearance is consistent with edema. Sonographer reports no sonographic Murphy sign. Common bile duct: Diameter: 4 mm Liver: Increased echogenicity of the liver parenchyma suggests fatty deposition. Portal vein is patent on color Doppler imaging with normal direction of blood flow towards the liver. IMPRESSION: Gallstones with gallbladder wall thickening and apparent gallbladder wall edema. Sonographic imaging features are highly suggestive of acute cholecystitis. Electronically Signed   By: Misty Stanley M.D.   On: 03/07/2018 19:10    EKG: Sinus tachycardia without ST elevation or depression  Thank you for allowing me to participate in the care of your patient. We will continue to follow.   Note: This dictation was prepared with Dragon dictation along with smaller phrase technology. Any transcriptional errors that result from this process are unintentional.  Time spent: 40 minutes  Rica Heather, MD    FULL CODE

## 2018-03-07 NOTE — Progress Notes (Signed)
Pt in dialysis unit, PD fluid sample collected and sent to lab. ED RN called to arrange transportation back to ED.

## 2018-03-07 NOTE — ED Triage Notes (Addendum)
Pt arrives to ED for hernia. Alert, oriented. Ambulatory. Family states low grade temp, nausea, and pain x 4 days. Hx renal failure.

## 2018-03-07 NOTE — Progress Notes (Signed)
Pt on his way to ED

## 2018-03-07 NOTE — ED Provider Notes (Signed)
Patient received in sign-out from Dr. Burlene Arnt.  Workup and evaluation pending CT imaging.  CT imaging does show evidence concerning for possible cholelithiasis or cholecystitis.  Will order ultrasound to further characterize.  I did discuss with general surgery who agrees with plan.   Right upper quadrant ultrasound does show evidence of acute cholecystitis.  I discussed case with Dr. Adora Fridge of general surgery agrees to admit patient.  I have also consulted hospitalist.  We will continue with IV antibiotics.  Patient remains hemodynamically stable at this time.     Merlyn Lot, MD 03/07/18 1944

## 2018-03-08 ENCOUNTER — Inpatient Hospital Stay: Payer: Medicare Other | Admitting: Anesthesiology

## 2018-03-08 ENCOUNTER — Encounter
Admission: EM | Disposition: A | Discharge status: Home / Self Care | Payer: Self-pay | Source: Home / Self Care | Attending: Surgery

## 2018-03-08 DIAGNOSIS — T82868A Thrombosis of vascular prosthetic devices, implants and grafts, initial encounter: Secondary | ICD-10-CM

## 2018-03-08 DIAGNOSIS — K439 Ventral hernia without obstruction or gangrene: Secondary | ICD-10-CM

## 2018-03-08 DIAGNOSIS — I10 Essential (primary) hypertension: Secondary | ICD-10-CM

## 2018-03-08 DIAGNOSIS — N186 End stage renal disease: Secondary | ICD-10-CM

## 2018-03-08 HISTORY — PX: CHOLECYSTECTOMY: SHX55

## 2018-03-08 HISTORY — PX: VENTRAL HERNIA REPAIR: SHX424

## 2018-03-08 HISTORY — PX: DIALYSIS/PERMA CATHETER INSERTION: CATH118288

## 2018-03-08 LAB — COMPREHENSIVE METABOLIC PANEL
ALBUMIN: 2.6 g/dL — AB (ref 3.5–5.0)
ALT: 29 U/L (ref 0–44)
AST: 27 U/L (ref 15–41)
Alkaline Phosphatase: 176 U/L — ABNORMAL HIGH (ref 38–126)
Anion gap: 8 (ref 5–15)
BUN: 70 mg/dL — AB (ref 6–20)
CHLORIDE: 108 mmol/L (ref 98–111)
CO2: 25 mmol/L (ref 22–32)
Calcium: 8.2 mg/dL — ABNORMAL LOW (ref 8.9–10.3)
Creatinine, Ser: 6.41 mg/dL — ABNORMAL HIGH (ref 0.61–1.24)
GFR calc Af Amer: 10 mL/min — ABNORMAL LOW (ref >60)
GFR calc non Af Amer: 9 mL/min — ABNORMAL LOW (ref >60)
GLUCOSE: 97 mg/dL (ref 70–99)
POTASSIUM: 4.7 mmol/L (ref 3.5–5.1)
Sodium: 141 mmol/L (ref 135–145)
Total Bilirubin: 0.7 mg/dL (ref 0.3–1.2)
Total Protein: 6 g/dL — ABNORMAL LOW (ref 6.5–8.1)

## 2018-03-08 LAB — MAGNESIUM: Magnesium: 1.5 mg/dL — ABNORMAL LOW (ref 1.7–2.4)

## 2018-03-08 LAB — PHOSPHORUS: Phosphorus: 5.1 mg/dL — ABNORMAL HIGH (ref 2.5–4.6)

## 2018-03-08 LAB — CBC
HCT: 32.1 % — ABNORMAL LOW (ref 40.0–52.0)
HEMOGLOBIN: 10.6 g/dL — AB (ref 13.0–18.0)
MCH: 30.4 pg (ref 26.0–34.0)
MCHC: 33.1 g/dL (ref 32.0–36.0)
MCV: 91.8 fL (ref 80.0–100.0)
Platelets: 249 10*3/uL (ref 150–440)
RBC: 3.5 MIL/uL — AB (ref 4.40–5.90)
RDW: 16.2 % — ABNORMAL HIGH (ref 11.5–14.5)
WBC: 10 10*3/uL (ref 3.8–10.6)

## 2018-03-08 LAB — PATHOLOGIST SMEAR REVIEW

## 2018-03-08 LAB — MRSA PCR SCREENING: MRSA BY PCR: NEGATIVE

## 2018-03-08 SURGERY — LAPAROSCOPIC CHOLECYSTECTOMY
Anesthesia: General

## 2018-03-08 MED ORDER — FENTANYL CITRATE (PF) 100 MCG/2ML IJ SOLN
INTRAMUSCULAR | Status: AC
  Administered 2018-03-08: 25 ug via INTRAVENOUS
  Filled 2018-03-08: qty 2

## 2018-03-08 MED ORDER — DEXAMETHASONE SODIUM PHOSPHATE 10 MG/ML IJ SOLN
INTRAMUSCULAR | Status: AC
  Filled 2018-03-08: qty 1

## 2018-03-08 MED ORDER — PHENYLEPHRINE HCL 10 MG/ML IJ SOLN
INTRAMUSCULAR | Status: DC | PRN
  Administered 2018-03-08 (×4): 100 ug via INTRAVENOUS

## 2018-03-08 MED ORDER — LACTATED RINGERS IV SOLN
INTRAVENOUS | Status: DC | PRN
  Administered 2018-03-08: 17:00:00 via INTRAVENOUS

## 2018-03-08 MED ORDER — PROPOFOL 10 MG/ML IV BOLUS
INTRAVENOUS | Status: DC | PRN
  Administered 2018-03-08: 160 mg via INTRAVENOUS

## 2018-03-08 MED ORDER — LIDOCAINE HCL (CARDIAC) PF 100 MG/5ML IV SOSY
PREFILLED_SYRINGE | INTRAVENOUS | Status: DC | PRN
  Administered 2018-03-08: 100 mg via INTRAVENOUS

## 2018-03-08 MED ORDER — ACETAMINOPHEN 10 MG/ML IV SOLN
INTRAVENOUS | Status: AC
  Administered 2018-03-08: 1000 mg via INTRAVENOUS
  Filled 2018-03-08: qty 100

## 2018-03-08 MED ORDER — ONDANSETRON HCL 4 MG/2ML IJ SOLN
INTRAMUSCULAR | Status: AC
  Filled 2018-03-08: qty 2

## 2018-03-08 MED ORDER — HYDROMORPHONE HCL 1 MG/ML IJ SOLN
0.5000 mg | Freq: Once | INTRAMUSCULAR | Status: AC
  Administered 2018-03-08: 0.5 mg via INTRAVENOUS
  Filled 2018-03-08: qty 1

## 2018-03-08 MED ORDER — HYDROMORPHONE HCL 1 MG/ML IJ SOLN
INTRAMUSCULAR | Status: AC
  Administered 2018-03-08: 0.25 mg via INTRAVENOUS
  Filled 2018-03-08: qty 1

## 2018-03-08 MED ORDER — MIDAZOLAM HCL 2 MG/2ML IJ SOLN
INTRAMUSCULAR | Status: DC | PRN
  Administered 2018-03-08: 2 mg via INTRAVENOUS

## 2018-03-08 MED ORDER — BUPIVACAINE-EPINEPHRINE (PF) 0.25% -1:200000 IJ SOLN
INTRAMUSCULAR | Status: AC
  Filled 2018-03-08: qty 30

## 2018-03-08 MED ORDER — DEXAMETHASONE SODIUM PHOSPHATE 10 MG/ML IJ SOLN
INTRAMUSCULAR | Status: DC | PRN
  Administered 2018-03-08: 5 mg via INTRAVENOUS

## 2018-03-08 MED ORDER — ONDANSETRON HCL 4 MG/2ML IJ SOLN
INTRAMUSCULAR | Status: DC | PRN
  Administered 2018-03-08: 4 mg via INTRAVENOUS

## 2018-03-08 MED ORDER — HYDROMORPHONE HCL 1 MG/ML IJ SOLN
0.2500 mg | INTRAMUSCULAR | Status: AC | PRN
  Administered 2018-03-08 (×8): 0.25 mg via INTRAVENOUS

## 2018-03-08 MED ORDER — MAGNESIUM SULFATE 2 GM/50ML IV SOLN: 2.0000 g | Freq: Once | INTRAVENOUS | Status: DC

## 2018-03-08 MED ORDER — BUPIVACAINE-EPINEPHRINE 0.25% -1:200000 IJ SOLN
INTRAMUSCULAR | Status: DC | PRN
  Administered 2018-03-08: 30 mL

## 2018-03-08 MED ORDER — LIDOCAINE HCL (PF) 2 % IJ SOLN
INTRAMUSCULAR | Status: AC
  Filled 2018-03-08: qty 10

## 2018-03-08 MED ORDER — SUGAMMADEX SODIUM 200 MG/2ML IV SOLN
INTRAVENOUS | Status: AC
  Filled 2018-03-08: qty 2

## 2018-03-08 MED ORDER — GLYCOPYRROLATE 0.2 MG/ML IJ SOLN
INTRAMUSCULAR | Status: DC | PRN
  Administered 2018-03-08: 0.6 mg via INTRAVENOUS

## 2018-03-08 MED ORDER — FENTANYL CITRATE (PF) 100 MCG/2ML IJ SOLN
25.0000 ug | INTRAMUSCULAR | Status: AC | PRN
  Administered 2018-03-08 (×6): 25 ug via INTRAVENOUS

## 2018-03-08 MED ORDER — ROCURONIUM BROMIDE 100 MG/10ML IV SOLN
INTRAVENOUS | Status: DC | PRN
  Administered 2018-03-08: 10 mg via INTRAVENOUS
  Administered 2018-03-08: 40 mg via INTRAVENOUS

## 2018-03-08 MED ORDER — SODIUM CHLORIDE 0.9 % IV SOLN
INTRAVENOUS | Status: DC | PRN
  Administered 2018-03-08: 18:00:00 via INTRAVENOUS

## 2018-03-08 MED ORDER — ROCURONIUM BROMIDE 50 MG/5ML IV SOLN
INTRAVENOUS | Status: AC
  Filled 2018-03-08: qty 1

## 2018-03-08 MED ORDER — FAMOTIDINE IN NACL 20-0.9 MG/50ML-% IV SOLN
20.0000 mg | INTRAVENOUS | Status: DC
  Administered 2018-03-10 – 2018-03-11 (×2): 20 mg via INTRAVENOUS
  Filled 2018-03-08 (×2): qty 50

## 2018-03-08 MED ORDER — FENTANYL CITRATE (PF) 100 MCG/2ML IJ SOLN
INTRAMUSCULAR | Status: DC | PRN
  Administered 2018-03-08 (×2): 50 ug via INTRAVENOUS

## 2018-03-08 MED ORDER — HYDROCODONE-ACETAMINOPHEN 5-325 MG PO TABS
1.0000 | ORAL_TABLET | ORAL | Status: DC | PRN
  Administered 2018-03-08: 1 via ORAL
  Filled 2018-03-08: qty 2

## 2018-03-08 MED ORDER — PROPOFOL 10 MG/ML IV BOLUS
INTRAVENOUS | Status: AC
  Filled 2018-03-08: qty 20

## 2018-03-08 MED ORDER — ACETAMINOPHEN 10 MG/ML IV SOLN
1000.0000 mg | Freq: Once | INTRAVENOUS | Status: AC
  Administered 2018-03-08: 1000 mg via INTRAVENOUS

## 2018-03-08 MED ORDER — FENTANYL CITRATE (PF) 100 MCG/2ML IJ SOLN
INTRAMUSCULAR | Status: AC
  Filled 2018-03-08: qty 2

## 2018-03-08 MED ORDER — MIDAZOLAM HCL 2 MG/2ML IJ SOLN
INTRAMUSCULAR | Status: AC
  Filled 2018-03-08: qty 2

## 2018-03-08 MED ORDER — NEOSTIGMINE METHYLSULFATE 10 MG/10ML IV SOLN
INTRAVENOUS | Status: DC | PRN
  Administered 2018-03-08: 3 mg via INTRAVENOUS

## 2018-03-08 SURGICAL SUPPLY — 46 items
APPLICATOR COTTON TIP 6 STRL (MISCELLANEOUS) IMPLANT
APPLICATOR COTTON TIP 6IN STRL (MISCELLANEOUS)
APPLIER CLIP 5 13 M/L LIGAMAX5 (MISCELLANEOUS) ×4
BLADE SURG 15 STRL LF DISP TIS (BLADE) ×2 IMPLANT
BLADE SURG 15 STRL SS (BLADE) ×2
CANISTER SUCT 1200ML W/VALVE (MISCELLANEOUS) ×4 IMPLANT
CHLORAPREP W/TINT 26ML (MISCELLANEOUS) ×4 IMPLANT
CHOLANGIOGRAM CATH TAUT (CATHETERS) IMPLANT
CLEANER CAUTERY TIP 5X5 PAD (MISCELLANEOUS) IMPLANT
CLIP APPLIE 5 13 M/L LIGAMAX5 (MISCELLANEOUS) ×2 IMPLANT
DECANTER SPIKE VIAL GLASS SM (MISCELLANEOUS) ×4 IMPLANT
DERMABOND ADVANCED (GAUZE/BANDAGES/DRESSINGS) ×2
DERMABOND ADVANCED .7 DNX12 (GAUZE/BANDAGES/DRESSINGS) ×2 IMPLANT
DRAPE C-ARM XRAY 36X54 (DRAPES) IMPLANT
ELECT CAUTERY BLADE 6.4 (BLADE) ×4 IMPLANT
ELECT REM PT RETURN 9FT ADLT (ELECTROSURGICAL) ×4
ELECTRODE REM PT RTRN 9FT ADLT (ELECTROSURGICAL) ×2 IMPLANT
GLOVE BIO SURGEON STRL SZ7 (GLOVE) ×12 IMPLANT
GOWN STRL REUS W/ TWL LRG LVL3 (GOWN DISPOSABLE) ×6 IMPLANT
GOWN STRL REUS W/TWL LRG LVL3 (GOWN DISPOSABLE) ×6
IRRIGATION STRYKERFLOW (MISCELLANEOUS) ×2 IMPLANT
IRRIGATOR STRYKERFLOW (MISCELLANEOUS) ×4
IV CATH ANGIO 12GX3 LT BLUE (NEEDLE) IMPLANT
IV NS 1000ML (IV SOLUTION) ×2
IV NS 1000ML BAXH (IV SOLUTION) ×2 IMPLANT
L-HOOK LAP DISP 36CM (ELECTROSURGICAL) ×4
LHOOK LAP DISP 36CM (ELECTROSURGICAL) ×2 IMPLANT
NEEDLE HYPO 22GX1.5 SAFETY (NEEDLE) ×8 IMPLANT
NS IRRIG 1000ML POUR BTL (IV SOLUTION) ×4 IMPLANT
PACK LAP CHOLECYSTECTOMY (MISCELLANEOUS) ×4 IMPLANT
PAD CLEANER CAUTERY TIP 5X5 (MISCELLANEOUS)
PENCIL ELECTRO HAND CTR (MISCELLANEOUS) ×4 IMPLANT
POUCH SPECIMEN RETRIEVAL 10MM (ENDOMECHANICALS) ×4 IMPLANT
SCISSORS METZENBAUM CVD 33 (INSTRUMENTS) ×4 IMPLANT
SLEEVE ENDOPATH XCEL 5M (ENDOMECHANICALS) ×8 IMPLANT
SOL ANTI-FOG 6CC FOG-OUT (MISCELLANEOUS) ×2 IMPLANT
SOL FOG-OUT ANTI-FOG 6CC (MISCELLANEOUS) ×2
SPONGE LAP 18X18 RF (DISPOSABLE) ×4 IMPLANT
STOPCOCK 4 WAY LG BORE MALE ST (IV SETS) IMPLANT
SUT ETHIBOND 0 MO6 C/R (SUTURE) ×4 IMPLANT
SUT MNCRL AB 4-0 PS2 18 (SUTURE) ×4 IMPLANT
SUT VICRYL 0 AB UR-6 (SUTURE) ×8 IMPLANT
SYR 20CC LL (SYRINGE) ×4 IMPLANT
TROCAR XCEL BLUNT TIP 100MML (ENDOMECHANICALS) ×4 IMPLANT
TROCAR XCEL NON-BLD 5MMX100MML (ENDOMECHANICALS) ×4 IMPLANT
TUBING INSUFFLATION (TUBING) ×4 IMPLANT

## 2018-03-08 NOTE — Anesthesia Procedure Notes (Signed)
Procedure Name: Intubation Date/Time: 03/08/2018 5:07 PM Performed by: Aline Brochure, CRNA Pre-anesthesia Checklist: Patient identified, Emergency Drugs available, Suction available and Patient being monitored Patient Re-evaluated:Patient Re-evaluated prior to induction Oxygen Delivery Method: Circle system utilized Preoxygenation: Pre-oxygenation with 100% oxygen Induction Type: IV induction Ventilation: Mask ventilation without difficulty Laryngoscope Size: Mac and 4 Grade View: Grade I Tube type: Oral Tube size: 7.5 mm Number of attempts: 1 Airway Equipment and Method: Stylet Placement Confirmation: ETT inserted through vocal cords under direct vision,  positive ETCO2 and breath sounds checked- equal and bilateral Secured at: 22 cm Tube secured with: Tape Dental Injury: Teeth and Oropharynx as per pre-operative assessment

## 2018-03-08 NOTE — Progress Notes (Addendum)
[facility] NAME: Joseph [name]    MR#:  [id number]  DATE OF BIRTH:  [birth year]  SUBJECTIVE:  CHIEF COMPLAINT:   Chief Complaint  Patient presents with  . Hernia   RUQ pain. REVIEW OF SYSTEMS:  Review of Systems  Constitutional: Negative for chills, fever and malaise/fatigue.  HENT: Positive for hearing loss. Negative for sore throat.   Eyes: Negative for blurred vision and double vision.  Respiratory: Negative for cough, hemoptysis, shortness of breath, wheezing and stridor.   Cardiovascular: Negative for chest pain, palpitations, orthopnea and leg swelling.  Gastrointestinal: Positive for abdominal pain. Negative for blood in stool, diarrhea, melena, nausea and vomiting.  Genitourinary: Negative for dysuria, flank pain and hematuria.  Musculoskeletal: Negative for back pain and joint pain.  Skin: Negative for rash.  Neurological: Negative for dizziness, sensory change, focal weakness, seizures, loss of consciousness, weakness and headaches.  Endo/Heme/Allergies: Negative for polydipsia.  Psychiatric/Behavioral: Negative for depression. The patient is not nervous/anxious.     DRUG ALLERGIES:   Allergies  Allergen Reactions  . Ciprofloxacin Hcl Swelling    High fever  . Doxycycline Hyclate Swelling  . Phenol-Glycerin Swelling   VITALS:  Blood pressure 117/80, pulse 80, temperature 98.7 F (37.1 C), temperature source Oral, resp. rate 18, height 5\' 11"  (1.803 m), weight 85.7 kg, SpO2 98 %. PHYSICAL EXAMINATION:  Physical Exam  Constitutional: He is oriented to person, place, and time. He appears well-developed.  HENT:  Head: Normocephalic.  Mouth/Throat: Oropharynx is clear and moist.  Eyes: Pupils are equal, round, and reactive to light. Conjunctivae and EOM are normal. No scleral icterus.  Neck: Normal range of motion. Neck supple. No JVD present. No tracheal deviation present.  Cardiovascular: Normal rate,  regular rhythm and normal heart sounds. Exam reveals no gallop.  No murmur heard. Pulmonary/Chest: Effort normal and breath sounds normal. No respiratory distress. He has no wheezes. He has no rales.  Abdominal: Soft. Bowel sounds are normal. He exhibits no distension. There is tenderness. There is no rebound.  Musculoskeletal: Normal range of motion. He exhibits no edema or tenderness.  Neurological: He is alert and oriented to person, place, and time. No cranial nerve deficit.  Skin: No rash noted. No erythema.  Psychiatric: He has a normal mood and affect.   LABORATORY PANEL:  Male CBC Recent Labs  Lab 03/08/18 0523  WBC 10.0  HGB 10.6*  HCT 32.1*  PLT 249   ------------------------------------------------------------------------------------------------------------------ Chemistries  Recent Labs  Lab 03/08/18 0523  NA 141  K 4.7  CL 108  CO2 25  GLUCOSE 97  BUN 70*  CREATININE 6.41*  CALCIUM 8.2*  MG 1.5*  AST 27  ALT 29  ALKPHOS 176*  BILITOT 0.7   RADIOLOGY:  Ct Abdomen Pelvis Wo Contrast  Result Date: 03/07/2018 CLINICAL DATA:  Hernia, low-grade temperature, nausea and pain for 4 days. Renal failure. EXAM: CT ABDOMEN AND PELVIS WITHOUT CONTRAST TECHNIQUE: Multidetector CT imaging of the abdomen and pelvis was performed following the standard protocol without IV contrast. COMPARISON:  CT abdomen dated 09/23/2017. FINDINGS: Lower chest: No acute abnormality. Hepatobiliary: Gallbladder is moderately distended and there is probable gallbladder wall thickening and/or pericholecystic edema. Small stones are now seen at the expected level of the cystic duct (series 2, images 23 and 24), a new finding compared to the earlier CT of 09/23/2017. Several additional gallstones are again seen in the region of the gallbladder neck. No common bile duct  or intrahepatic bile duct dilatation seen. No focal liver abnormality. Pancreas: Partially infiltrated with fat but otherwise  unremarkable. Spleen: Normal in size without focal abnormality. Adrenals/Urinary Tract: RIGHT adrenal gland is decreased in size, consistent with expected evolution of the previous adrenal hematoma. LEFT adrenal gland appears stable. Embolization coils again noted at the LEFT renal hilum/pelvis. RIGHT renal cyst is stable. No new renal abnormality. No renal stone or hydronephrosis. No ureteral or bladder calculi identified. Bladder appears normal. Stomach/Bowel: No dilated large or small bowel loops. No evidence of acute bowel wall inflammation. Mild diverticulosis of the sigmoid and descending colon but no focal inflammatory change to suggest acute diverticulitis. Stomach is unremarkable, partially decompressed. Appendix is not seen. Vascular/Lymphatic: Again noted are mildly prominent lymph nodes scattered throughout the retroperitoneum, not significantly changed for over 2 years indicating benignity. Scattered aortic atherosclerosis. Reproductive: Prostate is unremarkable. Other: Small amount of free fluid again noted adjacent to the liver. No abscess collection. No free intraperitoneal air. Peritoneal dialysis catheter appears stable in position with the tip positioned in the RIGHT lower quadrant. Stable small periumbilical abdominal wall hernia containing fat only. Musculoskeletal: No acute or suspicious osseous finding. IMPRESSION: 1. Findings highly suggestive of acute cholecystitis. Gallbladder is moderately distended with probable gallbladder wall thickening and/or pericholecystic edema. Associated small stones are now appreciated at the level of the cystic duct, presumably obstructive. Additional small gallstones again noted in the gallbladder neck region. Consider RIGHT upper quadrant ultrasound and/or nuclear medicine HIDA scan for confirmation. 2. Small amount of free fluid in the RIGHT upper quadrant. No abscess collection seen. 3. Colonic diverticulosis without evidence of acute diverticulitis. 4.  Stable small periumbilical abdominal wall hernia, containing mesenteric fat only. No herniated bowel. 5.  Aortic Atherosclerosis (ICD10-I70.0). 6. Additional chronic/incidental findings detailed above. Electronically Signed   By: Franki Cabot M.D.   On: 03/07/2018 15:32   US Abdomen Limited Ruq  Result Date: 03/07/2018 CLINICAL DATA:  Right upper quadrant pain EXAM: ULTRASOUND ABDOMEN LIMITED RIGHT UPPER QUADRANT COMPARISON:  CT scan from earlier today. FINDINGS: Gallbladder: Multiple gallstones identified, measuring up to 9 mm. Gallbladder wall is thickened at 4 mm. Striated gallbladder wall appearance is consistent with edema. Sonographer reports no sonographic Murphy sign. Common bile duct: Diameter: 4 mm Liver: Increased echogenicity of the liver parenchyma suggests fatty deposition. Portal vein is patent on color Doppler imaging with normal direction of blood flow towards the liver. IMPRESSION: Gallstones with gallbladder wall thickening and apparent gallbladder wall edema. Sonographic imaging features are highly suggestive of acute cholecystitis. Electronically Signed   By: Misty Stanley M.D.   On: 03/07/2018 19:10   ASSESSMENT AND PLAN:   52 year old male who is on peritoneal dialysis who presents with abdominal pain and found to have acute cholecystitis.  1.  Preoperative clearance: Patient is low risk for moderate risk procedure may proceed to surgery without further cardiac intervention.  2.  End-stage renal disease on peritoneal dialysis: Change to hemodialysis temporarily due to abdominal surgery per Dr. Holley Raring.  3.  Acute cholecystitis: Continue Zosyn, cholecystectomy today.  Hypomagnesemia.  IV magnesium and follow-up level.  Medically stable.  Sign off.  All the records are reviewed and case discussed with Care Management/Social Worker. Management plans discussed with the patient, his wife and they are in agreement.  CODE STATUS: Full Code  TOTAL TIME TAKING CARE OF THIS  PATIENT: 35 minutes.   More than 50% of the time was spent in counseling/coordination of care: YES  POSSIBLE D/C IN  2 DAYS, DEPENDING ON CLINICAL CONDITION.   Demetrios Loll M.D on 03/08/2018 at 2:44 PM  Between 7am to 6pm - Pager - [phone]  After 6pm go to [email or web address] - [facility]

## 2018-03-08 NOTE — Progress Notes (Signed)
Pt complaining of pain. 8/10. Ocycodone and Norco given with no relieve. Pt requesting for dilaudid but not due at this time. Dr Bradley Ferris notified. Order for dilaudid 0.5mg  x1 dose obtained.

## 2018-03-08 NOTE — Anesthesia Preprocedure Evaluation (Addendum)
Anesthesia Evaluation  Patient identified by MRN, date of birth, ID band Patient awake    Reviewed: Allergy & Precautions, H&P , NPO status , Patient's Chart, lab work & pertinent test results  Airway Mallampati: III       Dental   Pulmonary COPD, former smoker,           Cardiovascular hypertension, + CAD  + Valvular Problems/Murmurs (murmur)      Neuro/Psych negative neurological ROS  negative psych ROS   GI/Hepatic Neg liver ROS, GERD  ,  Endo/Other  negative endocrine ROS  Renal/GU ESRF and DialysisRenal disease     Musculoskeletal   Abdominal   Peds  Hematology   Anesthesia Other Findings Past Medical History: No date: Amyloidosis ([place]) No date: Blood clot in abdominal vein     Comment:  happened around age 63 due to traumatic injury No date: Blood dyscrasia     Comment:  amyloidosis 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 No date: Dry eye No date: GERD (gastroesophageal reflux disease) No date: Heart murmur     Comment:  never followed up with this, no problems No date: Hyperlipidemia No date: Hypertension     Comment:  improved since starting dialysis No date: Pneumonia     Comment:  as child No date: Renal insufficiency  Past Surgical History: 04/11/2015: AORTOGRAM; Bilateral     Comment:  Procedure: Aortogram;  Surgeon: Katha Cabal, MD;                Location: [facility];  Service: Cardiovascular;              Laterality: Bilateral; 07/09/2015: CAPD INSERTION; N/A     Comment:  Procedure: LAPAROSCOPIC INSERTION CONTINUOUS AMBULATORY               PERITONEAL DIALYSIS  (CAPD) CATHETER;  Surgeon: Katha Cabal, MD;  Location: [facility];  Service: Vascular;                Laterality: N/A; 01/11/2016: COLONOSCOPY WITH PROPOFOL; N/A     Comment:  Procedure: COLONOSCOPY WITH PROPOFOL;  Surgeon: [name], MD;  Location: [facility];  Service:  Endoscopy;              Laterality: N/A; No date: EXPLORATORY LAPAROTOMY     Comment:  done to find and remove abdominal blood clot as a               teenager 2016: IMPLANTATION / PLACEMENT PERMANENT EPIDURAL CATHETER; Right 04/11/2015: PERIPHERAL VASCULAR CATHETERIZATION; Left     Comment:  Procedure: Embolization;  Surgeon: Katha Cabal,               MD;  Location: [facility];  Service:               Cardiovascular;  Laterality: Left; 04/16/2015: PERIPHERAL VASCULAR CATHETERIZATION; N/A     Comment:  Procedure: Dialysis/Perma Catheter Insertion;  Surgeon:               Katha Cabal, MD;  Location: [facility];                Service: Cardiovascular;  Laterality: N/A; 08/24/2015: PERIPHERAL VASCULAR CATHETERIZATION; N/A     Comment:  Procedure: Dialysis/Perma Catheter Removal;  Surgeon:  Katha Cabal, MD;  Location: [facility];                Service: Cardiovascular;  Laterality: N/A; 04/06/2015: RENAL BIOPSY, PERCUTANEOUS     Comment:    No date: TONSILLECTOMY  BMI    Body Mass Index:  26.35 kg/m      Reproductive/Obstetrics negative OB ROS                            Anesthesia Physical Anesthesia Plan  ASA: III  Anesthesia Plan: General ETT   Post-op Pain Management:    Induction:   PONV Risk Score and Plan: Ondansetron and Dexamethasone  Airway Management Planned:   Additional Equipment:   Intra-op Plan:   Post-operative Plan:   Informed Consent: I have reviewed the patients History and Physical, chart, labs and discussed the procedure including the risks, benefits and alternatives for the proposed anesthesia with the patient or authorized representative who has indicated his/her understanding and acceptance.   Dental Advisory Given  Plan Discussed with: Anesthesiologist, CRNA and Surgeon  Anesthesia Plan Comments:         Anesthesia Quick Evaluation

## 2018-03-08 NOTE — Progress Notes (Signed)
[facility]  ROUNDING NOTE   Subjective:  The patient is very well-known to [place]. We follow him for outpatient peritoneal dialysis. He presented with a small ventral hernia. However in addition to this he was having right upper quadrant abdominal pain with eating. CT scan of the abdomen pelvis was performed and was consistent with acute cholecystitis. Patient is active on the transplant list at 2 centers. Laparoscopic cholecystectomy has been elected and will be performed later today. Patient will need to be switched to hemodialysis temporarily.   Objective:  Vital signs in last 24 hours:  Temp:  [98.1 F (36.7 C)-98.5 F (36.9 C)] 98.2 F (36.8 C) (09/13 0558) Pulse Rate:  [70-91] 84 (09/13 0558) Resp:  [16-20] 20 (09/13 0558) BP: (115-142)/(78-101) 115/83 (09/13 0558) SpO2:  [96 %-100 %] 99 % (09/13 0558) Weight:  [85.7 kg-86.2 kg] 85.7 kg (09/12 2229)  Weight change:  Filed Weights   03/07/18 1216 03/07/18 2229  Weight: 86.2 kg 85.7 kg    Intake/Output: I/O last 3 completed shifts: In: 243.6 [I.V.:143.6; IV Piggyback:100] Out: 300 [Urine:300]   Intake/Output this shift:  No intake/output data recorded.  Physical Exam: General: No acute distress  Head: Normocephalic, atraumatic. Moist oral mucosal membranes  Eyes: Anicteric  Neck: Supple, trachea midline  Lungs:  Clear to auscultation, normal effort  Heart: S1S2 no rubs  Abdomen:  Soft, mild RUQ tenderness, small ventral hernia  Extremities: Trace peripheral edema.  Neurologic: Awake, alert, following commands  Skin: No lesions  Access: PD catheter in place    Basic Metabolic Panel: Recent Labs  Lab 03/07/18 1218 03/08/18 0523  NA 138 141  K 4.6 4.7  CL 102 108  CO2 25 25  GLUCOSE 104* 97  BUN 61* 70*  CREATININE 6.18* 6.41*  CALCIUM 8.7* 8.2*  MG  --  1.5*  PHOS  --  5.1*    Liver Function Tests: Recent Labs  Lab 03/07/18 1218 03/08/18 0523  AST 26 27  ALT 31 29  ALKPHOS  190* 176*  BILITOT 0.6 0.7  PROT 7.3 6.0*  ALBUMIN 3.2* 2.6*   No results for input(s): LIPASE, AMYLASE in the last 168 hours. No results for input(s): AMMONIA in the last 168 hours.  CBC: Recent Labs  Lab 03/07/18 1218 03/08/18 0523  WBC 12.6* 10.0  NEUTROABS 8.3*  --   HGB 12.0* 10.6*  HCT 36.1* 32.1*  MCV 91.2 91.8  PLT 286 249    Cardiac Enzymes: No results for input(s): CKTOTAL, CKMB, CKMBINDEX, TROPONINI in the last 168 hours.  BNP: Invalid input(s): POCBNP  CBG: No results for input(s): GLUCAP in the last 168 hours.  Microbiology: Results for orders placed or performed during the hospital encounter of 03/07/18  Culture, blood (routine x 2)     Status: None (Preliminary result)   Collection Time: 03/07/18  2:51 PM  Result Value Ref Range Status   Specimen Description BLOOD RIGHT [facility]  Final   Special Requests   Final    BOTTLES DRAWN AEROBIC AND ANAEROBIC Blood Culture adequate volume   Culture   Final    NO GROWTH < 24 HOURS Performed at [facility], [address] [place], [place] [address]    Report Status PENDING  Incomplete  Culture, blood (routine x 2)     Status: None (Preliminary result)   Collection Time: 03/07/18  2:51 PM  Result Value Ref Range Status   Specimen Description BLOOD LEFT [facility]  Final   Special Requests  Final    BOTTLES DRAWN AEROBIC AND ANAEROBIC Blood Culture results may not be optimal due to an inadequate volume of blood received in culture bottles   Culture   Final    NO GROWTH < 12 HOURS Performed at [facility], [address] [place], [place] [address]    Report Status PENDING  Incomplete  Body fluid culture     Status: None (Preliminary result)   Collection Time: 03/07/18  5:49 PM  Result Value Ref Range Status   Specimen Description   Final    PERITONEAL DIALYSATE Performed at [facility], [address] [place], [place] [address]    Special Requests   Final    NONE Performed at  [facility], [address] [place], [place] [address]    Gram Stain   Final    FEW WBC PRESENT,BOTH PMN AND MONONUCLEAR NO ORGANISMS SEEN Performed at [facility], [place] [address], [place], [place] [address]    Culture PENDING  Incomplete   Report Status PENDING  Incomplete  MRSA PCR Screening     Status: None   Collection Time: 03/08/18  6:22 AM  Result Value Ref Range Status   MRSA by PCR NEGATIVE NEGATIVE Final    Comment:        The GeneXpert MRSA Assay (FDA approved for NASAL specimens only), is one component of a comprehensive MRSA colonization surveillance program. It is not intended to diagnose MRSA infection nor to guide or monitor treatment for MRSA infections. Performed at [facility], [address] [place], [place] [address]     Coagulation Studies: No results for input(s): LABPROT, INR in the last 72 hours.  Urinalysis: No results for input(s): COLORURINE, LABSPEC, PHURINE, GLUCOSEU, HGBUR, BILIRUBINUR, KETONESUR, PROTEINUR, UROBILINOGEN, NITRITE, LEUKOCYTESUR in the last 72 hours.  Invalid input(s): APPERANCEUR    Imaging: Ct Abdomen Pelvis Wo Contrast  Result Date: 03/07/2018 CLINICAL DATA:  Hernia, low-grade temperature, nausea and pain for 4 days. Renal failure. EXAM: CT ABDOMEN AND PELVIS WITHOUT CONTRAST TECHNIQUE: Multidetector CT imaging of the abdomen and pelvis was performed following the standard protocol without IV contrast. COMPARISON:  CT abdomen dated 09/23/2017. FINDINGS: Lower chest: No acute abnormality. Hepatobiliary: Gallbladder is moderately distended and there is probable gallbladder wall thickening and/or pericholecystic edema. Small stones are now seen at the expected level of the cystic duct (series 2, images 23 and 24), a new finding compared to the earlier CT of 09/23/2017. Several additional gallstones are again seen in the region of the gallbladder neck. No common bile duct or intrahepatic bile duct  dilatation seen. No focal liver abnormality. Pancreas: Partially infiltrated with fat but otherwise unremarkable. Spleen: Normal in size without focal abnormality. Adrenals/Urinary Tract: RIGHT adrenal gland is decreased in size, consistent with expected evolution of the previous adrenal hematoma. LEFT adrenal gland appears stable. Embolization coils again noted at the LEFT renal hilum/pelvis. RIGHT renal cyst is stable. No new renal abnormality. No renal stone or hydronephrosis. No ureteral or bladder calculi identified. Bladder appears normal. Stomach/Bowel: No dilated large or small bowel loops. No evidence of acute bowel wall inflammation. Mild diverticulosis of the sigmoid and descending colon but no focal inflammatory change to suggest acute diverticulitis. Stomach is unremarkable, partially decompressed. Appendix is not seen. Vascular/Lymphatic: Again noted are mildly prominent lymph nodes scattered throughout the retroperitoneum, not significantly changed for over 2 years indicating benignity. Scattered aortic atherosclerosis. Reproductive: Prostate is unremarkable. Other: Small amount of free fluid again  noted adjacent to the liver. No abscess collection. No free intraperitoneal air. Peritoneal dialysis catheter appears stable in position with the tip positioned in the RIGHT lower quadrant. Stable small periumbilical abdominal wall hernia containing fat only. Musculoskeletal: No acute or suspicious osseous finding. IMPRESSION: 1. Findings highly suggestive of acute cholecystitis. Gallbladder is moderately distended with probable gallbladder wall thickening and/or pericholecystic edema. Associated small stones are now appreciated at the level of the cystic duct, presumably obstructive. Additional small gallstones again noted in the gallbladder neck region. Consider RIGHT upper quadrant ultrasound and/or nuclear medicine HIDA scan for confirmation. 2. Small amount of free fluid in the RIGHT upper quadrant. No  abscess collection seen. 3. Colonic diverticulosis without evidence of acute diverticulitis. 4. Stable small periumbilical abdominal wall hernia, containing mesenteric fat only. No herniated bowel. 5.  Aortic Atherosclerosis (ICD10-I70.0). 6. Additional chronic/incidental findings detailed above. Electronically Signed   By: Franki Cabot M.D.   On: 03/07/2018 15:32   US Abdomen Limited Ruq  Result Date: 03/07/2018 CLINICAL DATA:  Right upper quadrant pain EXAM: ULTRASOUND ABDOMEN LIMITED RIGHT UPPER QUADRANT COMPARISON:  CT scan from earlier today. FINDINGS: Gallbladder: Multiple gallstones identified, measuring up to 9 mm. Gallbladder wall is thickened at 4 mm. Striated gallbladder wall appearance is consistent with edema. Sonographer reports no sonographic Murphy sign. Common bile duct: Diameter: 4 mm Liver: Increased echogenicity of the liver parenchyma suggests fatty deposition. Portal vein is patent on color Doppler imaging with normal direction of blood flow towards the liver. IMPRESSION: Gallstones with gallbladder wall thickening and apparent gallbladder wall edema. Sonographic imaging features are highly suggestive of acute cholecystitis. Electronically Signed   By: Misty Stanley M.D.   On: 03/07/2018 19:10     Medications:   . dextrose 5 % and 0.9% NaCl 50 mL/hr at 03/07/18 2226  . [START ON 03/10/2018] famotidine (PEPCID) IV    . piperacillin-tazobactam (ZOSYN)  IV 3.375 g (03/07/18 2321)   . cycloSPORINE  1 drop Both Eyes Daily  . ezetimibe  10 mg Oral Daily  . folic acid  5 mg Oral Daily  . heparin  5,000 Units Subcutaneous Q8H  . patiromer  1 packet Oral Daily   albuterol, hydrALAZINE, HYDROmorphone, ondansetron **OR** ondansetron (ZOFRAN) IV, oxyCODONE, polyvinyl alcohol  Assessment/ Plan:  52 y.o. male with end stage renal disease on peritoneal dialysis, amyloidosis, hypertension, hyperlipidemia, peptic ulcer disease, right adrenal hemorrhage, acute cholecystitis 9/19  1. End  stage renal disease: on peritoneal dialysis.  Patient has acute cholecystitis and will be undergoing lap scopic cholecystectomy today.  As such we will transition the patient temporarily to hemodialysis.  We will request vascular surgery consultation for this purpose.  2.  Acute cholecystitis.  As above patient to have lap scopic cystectomy.  He is maintained on Zosyn for now.  3.  Anemia of chronic kidney disease.  Hemoglobin currently 10.6.  Patient receives Epogen as an outpatient.  4.  Secondary hyperparathyroidism.  Continue to periodically monitor phosphorus over the course of the hospitalization.  5.  Hyperkalemia.  Patient to be maintained on Veltassa 1 packet p.o. daily.  Potassium at target at 4.7 at the moment.   LOS: 1 Glendell Schlottman 9/13/20199:25 AM

## 2018-03-08 NOTE — Transfer of Care (Signed)
Immediate Anesthesia Transfer of Care Note  Patient: Diron Haddon [place]  Procedure(s) Performed: LAPAROSCOPIC CHOLECYSTECTOMY (N/A ) HERNIA REPAIR VENTRAL ADULT DIALYSIS/PERMA CATHETER INSERTION  Patient Location: PACU  Anesthesia Type:General  Level of Consciousness: awake  Airway & Oxygen Therapy: Patient connected to face mask oxygen  Post-op Assessment: Post -op Vital signs reviewed and stable  Post vital signs: stable  Last Vitals:  Vitals Value Taken Time  BP    Temp    Pulse    Resp    SpO2      Last Pain:  Vitals:   03/08/18 0730  TempSrc: Oral  PainSc: 2          Complications: No apparent anesthesia complications

## 2018-03-08 NOTE — Op Note (Signed)
Pre-operative Diagnosis: Acute cholecystitis  Post-operative Diagnosis: Same  Procedure:  Laparoscopic  cholecystectomy with repair of ventral hernia  Surgeon: Caroleen Hamman, MD FACS  Anesthesia: Gen. with endotracheal tube   Findings: Acute  Cholecystitis w hydrops 3 cm ventral hernia  Estimated Blood Loss: 20cc         Drains: none         Specimens: Gallbladder           Complications: none   Procedure Details  The patient was seen again in the Holding Room. The benefits, complications, treatment options, and expected outcomes were discussed with the patient. The risks of bleeding, infection, recurrence of symptoms, failure to resolve symptoms, bile duct damage, bile duct leak, retained common bile duct stone, bowel injury, any of which could require further surgery and/or ERCP, stent, or papillotomy were reviewed with the patient. The likelihood of improving the patient's symptoms with return to their baseline status is good.  The patient and/or family concurred with the proposed plan, giving informed consent.  The patient was taken to Operating Room, identified as Joseph [name] and the procedure verified as Laparoscopic Cholecystectomy.  A Time Out was held and the above information confirmed.  Prior to the induction of general anesthesia, antibiotic prophylaxis was administered. VTE prophylaxis was in place. General endotracheal anesthesia was then administered and tolerated well. After the induction, the abdomen was prepped with Chloraprep and draped in the sterile fashion. The patient was positioned in the supine position. She was created over the hernia defect.  Using electrocautery we dissected the sac from the adjacent tissues and excised the sac.  The edges of the fascia were clean. Cut down technique was used to enter the abdominal cavity and a Hasson trochar was placed after two vicryl stitches were anchored to the fascia. Pneumoperitoneum was then created with CO2  and tolerated well without any adverse changes in the patient's vital signs.  Three 5-mm ports were placed in the right upper quadrant all under direct vision. All skin incisions  were infiltrated with a local anesthetic agent before making the incision and placing the trocars.   The patient was positioned  in reverse Trendelenburg, tilted slightly to the patient's left.  The gallbladder was identified, the fundus grasped and retracted cephalad. Adhesions were lysed bluntly. The infundibulum was grasped and retracted laterally, exposing the peritoneum overlying the triangle of Calot. This was then divided and exposed in a blunt fashion. An extended critical view of the cystic duct and cystic artery was obtained.  The cystic duct was clearly identified and bluntly dissected.   Artery and duct were double clipped and divided.  The gallbladder was taken from the gallbladder fossa in a retrograde fashion with the electrocautery. The gallbladder was removed and placed in an Endocatch bag. The liver bed was irrigated and inspected. Hemostasis was achieved with the electrocautery. Copious irrigation was utilized and was repeatedly aspirated until clear.  The gallbladder and Endocatch sac were then removed through a port site.    Inspection of the right upper quadrant was performed. No bleeding, bile duct injury or leak, or bowel injury was noted. Pneumoperitoneum was released.   Ventral Hernia defect was closed with multiple interrupted 0 Ethibond sutures in the standard fashion.Marland Kitchen 4-0 subcuticular Monocryl was used to close the skin. Dermabond was  applied.  The patient was then extubated and brought to the recovery room in stable condition. Sponge, lap, and needle counts were correct at closure and at the  conclusion of the case.               Caroleen Hamman, MD, FACS

## 2018-03-08 NOTE — H&P (Signed)
Patient ID: Joseph [name], male   DOB: 1965-09-14, 52 y.o.   MRN: [id number]  HPI Joseph [name] is a 52 y.o. male the history of amyloidosis and end-stage renal disease on peritoneal dialysis.  He comes in today for symptomatic ventral hernia that was reduced but the patient also complains of 4-day history of abdominal pain nausea and vomiting.  Some decreased appetite.  No evidence of biliary obstruction no evidence of cholangitis.  No fevers no chills.  He reports that he is got intermittent pain in the right upper quadrant worsening with certain meals.  There is no specific alleviating factors.  He was on a transplant list of multiple centers in both her [place] and [place] and have been advised for cholecystectomy due to gallstones.  Work-up in the ER including a CT scan and ultrasound that I have personally reviewed show evidence of cholecystitis with gallstones.  Normal common bile duct. White count is mildly elevated and LFTs are normal other than mild elevation of the alkaline phosphatase. He Is able to perform more than 4 METS of activity without any shortness of breath or chest pain  HPI      Past Medical History:  Diagnosis Date  . Amyloidosis ([place])   . Blood clot in abdominal vein    happened around age 43 due to traumatic injury  . Blood dyscrasia    amyloidosis  . Central serous chorioretinopathy   . Dry eye   . GERD (gastroesophageal reflux disease)   . Heart murmur    never followed up with this, no problems  . Hyperlipidemia   . Hypertension    improved since starting dialysis  . Pneumonia    as child  . Renal insufficiency          Past Surgical History:  Procedure Laterality Date  . AORTOGRAM Bilateral 04/11/2015   Procedure: Aortogram;  Surgeon: Katha Cabal, MD;  Location: [facility];  Service: Cardiovascular;  Laterality: Bilateral;  . CAPD INSERTION N/A 07/09/2015   Procedure: LAPAROSCOPIC INSERTION CONTINUOUS  AMBULATORY PERITONEAL DIALYSIS  (CAPD) CATHETER;  Surgeon: Katha Cabal, MD;  Location: [facility];  Service: Vascular;  Laterality: N/A;  . COLONOSCOPY WITH PROPOFOL N/A 01/11/2016   Procedure: COLONOSCOPY WITH PROPOFOL;  Surgeon: [name], MD;  Location: [facility];  Service: Endoscopy;  Laterality: N/A;  . EXPLORATORY LAPAROTOMY     done to find and remove abdominal blood clot as a teenager  . IMPLANTATION / PLACEMENT PERMANENT EPIDURAL CATHETER Right 2016  . PERIPHERAL VASCULAR CATHETERIZATION Left 04/11/2015   Procedure: Embolization;  Surgeon: Katha Cabal, MD;  Location: [facility];  Service: Cardiovascular;  Laterality: Left;  . PERIPHERAL VASCULAR CATHETERIZATION N/A 04/16/2015   Procedure: Dialysis/Perma Catheter Insertion;  Surgeon: Katha Cabal, MD;  Location: [facility];  Service: Cardiovascular;  Laterality: N/A;  . PERIPHERAL VASCULAR CATHETERIZATION N/A 08/24/2015   Procedure: Dialysis/Perma Catheter Removal;  Surgeon: Katha Cabal, MD;  Location: [facility];  Service: Cardiovascular;  Laterality: N/A;  . RENAL BIOPSY, PERCUTANEOUS  04/06/2015      . TONSILLECTOMY           Family History  Problem Relation Age of Onset  . Hypertension Mother   . Birth defects Sister   . Multiple sclerosis Sister   . Heart disease Maternal Uncle   . Early death Maternal Uncle   . Heart attack Maternal Uncle   . Hypertension Other   .  Heart attack Maternal Uncle   . Heart disease Maternal Uncle     Social History Social History        Tobacco Use  . Smoking status: Former Smoker    Packs/day: 0.50    Years: 20.00    Pack years: 10.00    Types: Cigarettes    Last attempt to quit: 11/04/2006    Years since quitting: 11.3  . Smokeless tobacco: Never Used  Substance Use Topics  . Alcohol use: No  . Drug use: No         Allergies  Allergen Reactions  . Ciprofloxacin Hcl Swelling     High fever  . Doxycycline Hyclate Swelling  . Phenol-Glycerin Swelling             Current Facility-Administered Medications  Medication Dose Route Frequency Provider Last Rate Last Dose  . iopamidol (ISOVUE-300) 61 % injection 100 mL  100 mL Intravenous Once PRN Schuyler Amor, MD      . piperacillin-tazobactam (ZOSYN) IVPB 2.25 g  2.25 g Intravenous Once Merlyn Lot, MD             Current Outpatient Medications  Medication Sig Dispense Refill  . Aflibercept (EYLEA) 2 MG/0.05ML SOLN 1 Dose by Intravitreal route every 30 (thirty) days.    Marland Kitchen albuterol (PROVENTIL HFA;VENTOLIN HFA) 108 (90 Base) MCG/ACT inhaler Inhale 2 puffs into the lungs every 6 (six) hours as needed for wheezing or shortness of breath. 1 Inhaler 2  . atorvastatin (LIPITOR) 20 MG tablet TAKE 1 TABLET BY MOUTH ONCE DAILY 90 tablet 3  . cycloSPORINE (RESTASIS) 0.05 % ophthalmic emulsion Place 1 drop into both eyes daily.     Marland Kitchen Epoetin Alfa (EPOGEN IJ) Inject as directed every 30 (thirty) days. Administered at Dialysis center.    . ezetimibe (ZETIA) 10 MG tablet Take 1 tablet (10 mg total) by mouth daily. 90 tablet 3  . folic acid (FOLVITE) 1 MG tablet Take 5 mg by mouth daily.    Marland Kitchen gentamicin cream (GARAMYCIN) 0.1 % Apply 1 application topically at bedtime.   0  . HYDROmorphone (DILAUDID) 2 MG tablet Take 1 tablet (2 mg total) by mouth every 6 (six) hours as needed for severe pain. 30 tablet 0  . Hypromellose (ARTIFICIAL TEARS OP) Apply 1 drop to eye as needed.    Marland Kitchen losartan (COZAAR) 100 MG tablet Take 1 tablet (100 mg total) by mouth daily. (Patient taking differently: Take 50 mg by mouth daily. ) 90 tablet 3  . ondansetron (ZOFRAN-ODT) 4 MG disintegrating tablet Take 1 tablet (4 mg total) by mouth every 8 (eight) hours as needed for nausea or vomiting. 30 tablet 0  . oxyCODONE (OXYCONTIN) 10 mg 12 hr tablet Take 5-10 mg by mouth every 12 (twelve) hours.    . pantoprazole (PROTONIX) 40 MG tablet  Take 1 tablet (40 mg total) by mouth daily. 90 tablet 2  . polyethylene glycol (MIRALAX / GLYCOLAX) packet Take 17 g by mouth daily. (Patient taking differently: Take 17 g by mouth daily as needed. ) 14 each 0  . VELTASSA 8.4 g packet Take 1 packet by mouth daily.  3     Review of Systems Full ROS  was asked and was negative except for the information on the HPI  Physical Exam Blood pressure 122/78, pulse 76, temperature 98.1 F (36.7 C), temperature source Oral, resp. rate 16, height 5\' 11"  (1.803 m), weight 86.2 kg, SpO2 96 %. CONSTITUTIONAL: NAD EYES: Pupils are  equal, round, and reactive to light, Sclera are non-icteric. EARS, NOSE, MOUTH AND THROAT: The oropharynx is clear. The oral mucosa is pink and moist. Hearing is intact to voice. LYMPH NODES:  Lymph nodes in the neck are normal. RESPIRATORY:  Lungs are clear. There is normal respiratory effort, with equal breath sounds bilaterally, and without pathologic use of accessory muscles. CARDIOVASCULAR: Heart is regular without murmurs, gallops, or rubs. GI: The abdomen is  soft, TTP RUQ, + MUrphy. No peritonitis, PD cath in place w./o infection. Reducible ventral hernia. Previous lower paramedian laparotomy scar GU: Rectal deferred.   MUSCULOSKELETAL: Normal muscle strength and tone. No cyanosis or edema.   SKIN: Turgor is good and there are no pathologic skin lesions or ulcers. NEUROLOGIC: Motor and sensation is grossly normal. Cranial nerves are grossly intact. PSYCH:  Oriented to person, place and time. Affect is normal.  Data Reviewed  I have personally reviewed the patient's imaging, laboratory findings and medical records.    Assessment/Plan  52 year old male with symptoms consistent with acute cholecystitis on a patient with end-stage renal disease on PD catheter.  Has no contraindication for cholecystectomy.  Discussed with the patient in detail extensively about options of medical management versus surgical  management given the fact that he does have a symptomatic ventral hernia and the fact that he is got acute cholecystitis I do think that the best course of treatment will be surgical.  As well as has evaluated him and determined that he is a low risk for any perioperative morbidity and mortality.  Now the question will be more than likely he will need a short course of hemodialysis given that we are repairing the hernia and were doing an abdominal operation.  We will touch base with nephrology and vascular surgery regarding this issue. The risks, benefits, complications, treatment options, and expected outcomes were discussed with the patient. The possibilities of bleeding, recurrent infection, finding a normal gallbladder, perforation of viscus organs, damage to surrounding structures, bile leak, abscess formation, needing a drain placed, the need for additional procedures, reaction to medication, pulmonary aspiration,  failure to diagnose a condition, the possible need to convert to an open procedure, and creating a complication requiring transfusion or operation were discussed with the patient. The patient and/or family concurred with the proposed plan, giving informed consent.  Plan for lap chole in am. Statrt IV A/Bs and nephrology consult. Caroleen Hamman, MD FACS General Surgeon 03/07/2018, 8:49 PM

## 2018-03-08 NOTE — Anesthesia Post-op Follow-up Note (Signed)
Anesthesia QCDR form completed.        

## 2018-03-08 NOTE — Progress Notes (Signed)
Preoperative Review   Patient is met in the preoperatively. The history is reviewed in the chart and with the patient. I personally reviewed the options and rationale as well as the risks of this procedure that have been previously discussed with the patient. All questions asked by the patient and/or family were answered to their satisfaction.  Patient agrees to proceed with this procedure at this time.  Leib Elahi M.D. FACS   

## 2018-03-08 NOTE — Progress Notes (Signed)
Chaplain responded to and OR for an AD. Chaplain secured notary and witnesses and executed AD   03/08/18 1300  Clinical Encounter Type  Visited With Patient and family together  Visit Type Initial  Referral From Nurse  Spiritual Encounters  Spiritual Needs Brochure

## 2018-03-08 NOTE — Progress Notes (Signed)
Patient went up to surgery. NSL. CHG baths completed. Signed consent sent in chart. Wife at bedside.

## 2018-03-08 NOTE — Consult Note (Signed)
[facility] Vascular Consult Note  MRN : [id number]  Joseph [name] is a 52 y.o. (1965/11/02) male who presents with chief complaint of  Chief Complaint  Patient presents with  . Hernia  .  History of Present Illness:   I am asked to evaluate the patient by Dr. Holley Raring.  The patient is a 52 year old gentleman who presented to [facility] yesterday with right upper quadrant abdominal pain.  He has been diagnosed with cholecystitis.  He will require cholecystectomy.  As a complicating factor the patient has end-stage renal disease and is currently maintained via peritoneal dialysis.  Obviously, he will not be able to continue peritoneal dialysis in the short-term secondary to his surgery.  I am therefore asked to evaluate for dialysis access so that he may continue his life-saving therapy.  Current Meds  Medication Sig  . Aflibercept (EYLEA) 2 MG/0.05ML SOLN 1 Dose by Intravitreal route every 30 (thirty) days.  Marland Kitchen albuterol (PROVENTIL HFA;VENTOLIN HFA) 108 (90 Base) MCG/ACT inhaler Inhale 2 puffs into the lungs every 6 (six) hours as needed for wheezing or shortness of breath.  Marland Kitchen atorvastatin (LIPITOR) 20 MG tablet TAKE 1 TABLET BY MOUTH ONCE DAILY  . cycloSPORINE (RESTASIS) 0.05 % ophthalmic emulsion Place 1 drop into both eyes daily.   Marland Kitchen Epoetin Alfa (EPOGEN IJ) Inject as directed every 30 (thirty) days. Administered at Dialysis center.  . ezetimibe (ZETIA) 10 MG tablet Take 1 tablet (10 mg total) by mouth daily.  . folic acid (FOLVITE) 1 MG tablet Take 5 mg by mouth daily.  Marland Kitchen gentamicin cream (GARAMYCIN) 0.1 % Apply 1 application topically at bedtime.   Marland Kitchen HYDROmorphone (DILAUDID) 2 MG tablet Take 1 tablet (2 mg total) by mouth every 6 (six) hours as needed for severe pain.  . Hypromellose (ARTIFICIAL TEARS OP) Apply 1 drop to eye as needed.  Marland Kitchen losartan (COZAAR) 100 MG tablet Take 1 tablet (100 mg total) by mouth daily. (Patient taking  differently: Take 50 mg by mouth daily. )  . ondansetron (ZOFRAN-ODT) 4 MG disintegrating tablet Take 1 tablet (4 mg total) by mouth every 8 (eight) hours as needed for nausea or vomiting.  Marland Kitchen oxyCODONE (OXYCONTIN) 10 mg 12 hr tablet Take 5-10 mg by mouth every 12 (twelve) hours.  . pantoprazole (PROTONIX) 40 MG tablet Take 1 tablet (40 mg total) by mouth daily.  . polyethylene glycol (MIRALAX / GLYCOLAX) packet Take 17 g by mouth daily. (Patient taking differently: Take 17 g by mouth daily as needed. )  . VELTASSA 8.4 g packet Take 1 packet by mouth daily.    Past Medical History:  Diagnosis Date  . Amyloidosis ([place])   . Blood clot in abdominal vein    happened around age 81 due to traumatic injury  . Blood dyscrasia    amyloidosis  . Central serous chorioretinopathy   . Dry eye   . GERD (gastroesophageal reflux disease)   . Heart murmur    never followed up with this, no problems  . Hyperlipidemia   . Hypertension    improved since starting dialysis  . Pneumonia    as child  . Renal insufficiency     Past Surgical History:  Procedure Laterality Date  . AORTOGRAM Bilateral 04/11/2015   Procedure: Aortogram;  Surgeon: Katha Cabal, MD;  Location: [place] [facility];  Service: Cardiovascular;  Laterality: Bilateral;  . CAPD INSERTION N/A 07/09/2015   Procedure: LAPAROSCOPIC INSERTION CONTINUOUS AMBULATORY PERITONEAL DIALYSIS  (  CAPD) CATHETER;  Surgeon: Katha Cabal, MD;  Location: [facility];  Service: Vascular;  Laterality: N/A;  . COLONOSCOPY WITH PROPOFOL N/A 01/11/2016   Procedure: COLONOSCOPY WITH PROPOFOL;  Surgeon: [name], MD;  Location: [facility];  Service: Endoscopy;  Laterality: N/A;  . EXPLORATORY LAPAROTOMY     done to find and remove abdominal blood clot as a teenager  . IMPLANTATION / PLACEMENT PERMANENT EPIDURAL CATHETER Right 2016  . PERIPHERAL VASCULAR CATHETERIZATION Left 04/11/2015   Procedure: Embolization;  Surgeon: Katha Cabal, MD;   Location: [place] [facility];  Service: Cardiovascular;  Laterality: Left;  . PERIPHERAL VASCULAR CATHETERIZATION N/A 04/16/2015   Procedure: Dialysis/Perma Catheter Insertion;  Surgeon: Katha Cabal, MD;  Location: [place] [facility];  Service: Cardiovascular;  Laterality: N/A;  . PERIPHERAL VASCULAR CATHETERIZATION N/A 08/24/2015   Procedure: Dialysis/Perma Catheter Removal;  Surgeon: Katha Cabal, MD;  Location: [place] [facility];  Service: Cardiovascular;  Laterality: N/A;  . RENAL BIOPSY, PERCUTANEOUS  04/06/2015      . TONSILLECTOMY      Social History Social History   Tobacco Use  . Smoking status: Former Smoker    Packs/day: 0.50    Years: 20.00    Pack years: 10.00    Types: Cigarettes    Last attempt to quit: 11/04/2006    Years since quitting: 11.3  . Smokeless tobacco: Never Used  Substance Use Topics  . Alcohol use: No  . Drug use: No    Family History Family History  Problem Relation Age of Onset  . Hypertension Mother   . Birth defects Sister   . Multiple sclerosis Sister   . Heart disease Maternal Uncle   . Early death Maternal Uncle   . Heart attack Maternal Uncle   . Hypertension Other   . Heart attack Maternal Uncle   . Heart disease Maternal Uncle   No family history of bleeding/clotting disorders, porphyria or autoimmune disease   Allergies  Allergen Reactions  . Ciprofloxacin Hcl Swelling    High fever  . Doxycycline Hyclate Swelling  . Phenol-Glycerin Swelling     REVIEW OF SYSTEMS (Negative unless checked)  Constitutional: [] Weight loss  [] Fever  [] Chills Cardiac: [] Chest pain   [] Chest pressure   [] Palpitations   [] Shortness of breath when laying flat   [] Shortness of breath at rest   [] Shortness of breath with exertion. Vascular:  [] Pain in legs with walking   [] Pain in legs at rest   [] Pain in legs when laying flat   [] Claudication   [] Pain in feet when walking  [] Pain in feet at rest  [] Pain in feet when laying flat    [] History of DVT   [] Phlebitis   [] Swelling in legs   [] Varicose veins   [] Non-healing ulcers Pulmonary:   [] Uses home oxygen   [] Productive cough   [] Hemoptysis   [] Wheeze  [] COPD   [] Asthma Neurologic:  [] Dizziness  [] Blackouts   [] Seizures   [] History of stroke   [] History of TIA  [] Aphasia   [] Temporary blindness   [] Dysphagia   [] Weakness or numbness in arms   [] Weakness or numbness in legs Musculoskeletal:  [] Arthritis   [] Joint swelling   [] Joint pain   [] Low back pain Hematologic:  [] Easy bruising  [] Easy bleeding   [] Hypercoagulable state   [] Anemic  [] Hepatitis Gastrointestinal:  [] Blood in stool   [] Vomiting blood  [x] Gastroesophageal reflux/heartburn   [] Difficulty swallowing. Genitourinary:  [x] Chronic kidney disease   [] Difficult urination  [] Frequent  urination  [] Burning with urination   [] Blood in urine Skin:  [] Rashes   [] Ulcers   [] Wounds Psychological:  [] History of anxiety   []  History of major depression.  Physical Examination  Vitals:   03/07/18 2148 03/07/18 2229 03/08/18 0558 03/08/18 0730  BP: (!) 142/95  115/83 117/80  Pulse: 82  84 80  Resp: 20  20 18   Temp:   98.2 F (36.8 C) 98.7 F (37.1 C)  TempSrc:   Oral Oral  SpO2:   99% 98%  Weight:  85.7 kg    Height:  5\' 11"  (1.803 m)     Body mass index is 26.35 kg/m. Gen:  WD/WN, NAD Head: [place]/AT, No temporalis wasting. Prominent temp pulse not noted. Ear/Nose/Throat: Hearing grossly intact, nares w/o erythema or drainage, oropharynx w/o Erythema/Exudate Eyes: PERRLA, EOMI.  Neck: Supple, no nuchal rigidity.  No bruit or JVD.  Pulmonary:  Good air movement, clear to auscultation bilaterally.  Cardiac: RRR, normal S1, S2, no Murmurs, rubs or gallops. Vascular: PD catheter clean dry and intact Vessel Right Left  Radial Palpable Palpable   Gastrointestinal: soft, non-tender/non-distended. No guarding/reflex. No masses, surgical incisions, or scars. Musculoskeletal: M/S 5/5 throughout.  Extremities without  ischemic changes.  No deformity or atrophy. No edema. Neurologic: CN 2-12 intact. Pain and light touch intact in extremities.  Symmetrical.  Speech is fluent. Motor exam as listed above. Psychiatric: Judgment intact, Mood & affect appropriate for pt's clinical situation. Dermatologic: No rashes or ulcers noted.  No cellulitis or open wounds. Lymph : No Cervical, Axillary, or Inguinal lymphadenopathy.    CBC Lab Results  Component Value Date   WBC 10.0 03/08/2018   HGB 10.6 (L) 03/08/2018   HCT 32.1 (L) 03/08/2018   MCV 91.8 03/08/2018   PLT 249 03/08/2018    BMET    Component Value Date/Time   NA 141 03/08/2018 0523   NA 141 04/06/2014 0754   K 4.7 03/08/2018 0523   K 5.2 (H) 04/06/2014 0754   CL 108 03/08/2018 0523   CL 111 (H) 04/06/2014 0754   CO2 25 03/08/2018 0523   CO2 24 04/06/2014 0754   GLUCOSE 97 03/08/2018 0523   GLUCOSE 104 (H) 04/06/2014 0754   BUN 70 (H) 03/08/2018 0523   BUN 39 (H) 04/06/2014 0754   CREATININE 6.41 (H) 03/08/2018 0523   CREATININE 4.70 (H) 12/22/2015 0832   CALCIUM 8.2 (L) 03/08/2018 0523   CALCIUM 8.4 (L) 04/06/2014 0754   GFRNONAA 9 (L) 03/08/2018 0523   GFRNONAA 13 (L) 12/22/2015 0832   GFRAA 10 (L) 03/08/2018 0523   GFRAA 15 (L) 12/22/2015 8466   Estimated Creatinine Clearance: 14 mL/min (A) (by C-G formula based on SCr of 6.41 mg/dL (H)).  COAG Lab Results  Component Value Date   INR 1.09 07/05/2015   INR 1.05 06/16/2015   INR 1.06 05/31/2015    Radiology Ct Abdomen Pelvis Wo Contrast  Result Date: 03/07/2018 CLINICAL DATA:  Hernia, low-grade temperature, nausea and pain for 4 days. Renal failure. EXAM: CT ABDOMEN AND PELVIS WITHOUT CONTRAST TECHNIQUE: Multidetector CT imaging of the abdomen and pelvis was performed following the standard protocol without IV contrast. COMPARISON:  CT abdomen dated 09/23/2017. FINDINGS: Lower chest: No acute abnormality. Hepatobiliary: Gallbladder is moderately distended and there is probable  gallbladder wall thickening and/or pericholecystic edema. Small stones are now seen at the expected level of the cystic duct (series 2, images 23 and 24), a new finding compared to the earlier CT of  09/23/2017. Several additional gallstones are again seen in the region of the gallbladder neck. No common bile duct or intrahepatic bile duct dilatation seen. No focal liver abnormality. Pancreas: Partially infiltrated with fat but otherwise unremarkable. Spleen: Normal in size without focal abnormality. Adrenals/Urinary Tract: RIGHT adrenal gland is decreased in size, consistent with expected evolution of the previous adrenal hematoma. LEFT adrenal gland appears stable. Embolization coils again noted at the LEFT renal hilum/pelvis. RIGHT renal cyst is stable. No new renal abnormality. No renal stone or hydronephrosis. No ureteral or bladder calculi identified. Bladder appears normal. Stomach/Bowel: No dilated large or small bowel loops. No evidence of acute bowel wall inflammation. Mild diverticulosis of the sigmoid and descending colon but no focal inflammatory change to suggest acute diverticulitis. Stomach is unremarkable, partially decompressed. Appendix is not seen. Vascular/Lymphatic: Again noted are mildly prominent lymph nodes scattered throughout the retroperitoneum, not significantly changed for over 2 years indicating benignity. Scattered aortic atherosclerosis. Reproductive: Prostate is unremarkable. Other: Small amount of free fluid again noted adjacent to the liver. No abscess collection. No free intraperitoneal air. Peritoneal dialysis catheter appears stable in position with the tip positioned in the RIGHT lower quadrant. Stable small periumbilical abdominal wall hernia containing fat only. Musculoskeletal: No acute or suspicious osseous finding. IMPRESSION: 1. Findings highly suggestive of acute cholecystitis. Gallbladder is moderately distended with probable gallbladder wall thickening and/or  pericholecystic edema. Associated small stones are now appreciated at the level of the cystic duct, presumably obstructive. Additional small gallstones again noted in the gallbladder neck region. Consider RIGHT upper quadrant ultrasound and/or nuclear medicine HIDA scan for confirmation. 2. Small amount of free fluid in the RIGHT upper quadrant. No abscess collection seen. 3. Colonic diverticulosis without evidence of acute diverticulitis. 4. Stable small periumbilical abdominal wall hernia, containing mesenteric fat only. No herniated bowel. 5.  Aortic Atherosclerosis (ICD10-I70.0). 6. Additional chronic/incidental findings detailed above. Electronically Signed   By: Franki Cabot M.D.   On: 03/07/2018 15:32   US Abdomen Limited Ruq  Result Date: 03/07/2018 CLINICAL DATA:  Right upper quadrant pain EXAM: ULTRASOUND ABDOMEN LIMITED RIGHT UPPER QUADRANT COMPARISON:  CT scan from earlier today. FINDINGS: Gallbladder: Multiple gallstones identified, measuring up to 9 mm. Gallbladder wall is thickened at 4 mm. Striated gallbladder wall appearance is consistent with edema. Sonographer reports no sonographic Murphy sign. Common bile duct: Diameter: 4 mm Liver: Increased echogenicity of the liver parenchyma suggests fatty deposition. Portal vein is patent on color Doppler imaging with normal direction of blood flow towards the liver. IMPRESSION: Gallstones with gallbladder wall thickening and apparent gallbladder wall edema. Sonographic imaging features are highly suggestive of acute cholecystitis. Electronically Signed   By: Misty Stanley M.D.   On: 03/07/2018 19:10     Assessment/Plan 1.  Complication dialysis device with thrombosis AV access:  Patient's PD dialysis access is not usable perioperatively.  Given the transient bacteremia associated with surgery I do not feel that placement of a tunneled catheter at the time of surgery is indicated.  I will therefore move forward with placement of a temporary  catheter at the time of surgery and we will plan for conversion to a tunnel catheter early next week.  Risks and benefits of been reviewed all questions been answered patient agrees with this plan and will proceed.   2.  End-stage renal disease requiring hemodialysis:  Patient will continue dialysis therapy without further interruption if a successful thrombectomy is not achieved then catheter will be placed. Dialysis has already been arranged  since the patient missed their previous session 3.  Hypertension:  Patient will continue medical management; nephrology is following no changes in oral medications. 4. Hyperlipidemia:  Continue statin as ordered and reviewed, no changes at this time  5.  GERD:  Continue PPI as already ordered, this medication has been reviewed and there are no changes at this time.  Avoidence of caffeine and alcohol  Moderate elevation of the head of the bed      Hortencia Pilar, MD  03/08/2018 5:06 PM

## 2018-03-08 NOTE — Progress Notes (Signed)
PHARMACY NOTE:  RENAL DOSAGE ADJUSTMENT  Current regimen includes a mismatch between dosage and estimated renal function.  As per policy approved by the Pharmacy & Therapeutics and Medical Executive Committees, the dosage will be adjusted accordingly.  Current famotidine dosage:  20mg  IV every 12 hours  Renal Function:  Estimated Creatinine Clearance: 14 mL/min (A) (by C-G formula based on SCr of 6.41 mg/dL (H)). []      On intermittent HD, scheduled: [x]      On CRRT    Famotidine dosage has been changed to:  20 mg IV every 48 hours   Thank you for allowing pharmacy to be a part of this patient's care.  Vallery Sa, PhamD 03/08/2018 8:43 AM

## 2018-03-09 LAB — CBC
HCT: 30.6 % — ABNORMAL LOW (ref 40.0–52.0)
Hemoglobin: 10.2 g/dL — ABNORMAL LOW (ref 13.0–18.0)
MCH: 30 pg (ref 26.0–34.0)
MCHC: 33.2 g/dL (ref 32.0–36.0)
MCV: 90.1 fL (ref 80.0–100.0)
Platelets: 255 10*3/uL (ref 150–440)
RBC: 3.39 MIL/uL — ABNORMAL LOW (ref 4.40–5.90)
RDW: 16.6 % — AB (ref 11.5–14.5)
WBC: 12.1 10*3/uL — ABNORMAL HIGH (ref 3.8–10.6)

## 2018-03-09 LAB — BASIC METABOLIC PANEL
Anion gap: 6 (ref 5–15)
BUN: 81 mg/dL — AB (ref 6–20)
CALCIUM: 8.1 mg/dL — AB (ref 8.9–10.3)
CO2: 21 mmol/L — AB (ref 22–32)
CREATININE: 7.32 mg/dL — AB (ref 0.61–1.24)
Chloride: 109 mmol/L (ref 98–111)
GFR calc non Af Amer: 8 mL/min — ABNORMAL LOW (ref >60)
GFR, EST AFRICAN AMERICAN: 9 mL/min — AB (ref >60)
Glucose, Bld: 212 mg/dL — ABNORMAL HIGH (ref 70–99)
Potassium: 5.7 mmol/L — ABNORMAL HIGH (ref 3.5–5.1)
SODIUM: 136 mmol/L (ref 135–145)

## 2018-03-09 LAB — MAGNESIUM: MAGNESIUM: 1.5 mg/dL — AB (ref 1.7–2.4)

## 2018-03-09 LAB — HIV ANTIBODY (ROUTINE TESTING W REFLEX): HIV Screen 4th Generation wRfx: NONREACTIVE

## 2018-03-09 MED ORDER — LOSARTAN POTASSIUM 50 MG PO TABS
50.0000 mg | ORAL_TABLET | Freq: Every day | ORAL | Status: DC
  Administered 2018-03-10 – 2018-03-12 (×2): 50 mg via ORAL
  Filled 2018-03-09 (×2): qty 1

## 2018-03-09 MED ORDER — ATORVASTATIN CALCIUM 20 MG PO TABS
20.0000 mg | ORAL_TABLET | Freq: Every day | ORAL | Status: DC
  Administered 2018-03-09 – 2018-03-10 (×2): 20 mg via ORAL
  Filled 2018-03-09 (×2): qty 1

## 2018-03-09 MED ORDER — GENTAMICIN SULFATE 0.1 % EX CREA
TOPICAL_CREAM | Freq: Every day | CUTANEOUS | Status: DC
  Administered 2018-03-09 – 2018-03-11 (×3): via TOPICAL
  Filled 2018-03-09: qty 15

## 2018-03-09 MED ORDER — PANTOPRAZOLE SODIUM 40 MG PO TBEC
40.0000 mg | DELAYED_RELEASE_TABLET | Freq: Every day | ORAL | Status: DC
  Administered 2018-03-09 – 2018-03-12 (×3): 40 mg via ORAL
  Filled 2018-03-09 (×3): qty 1

## 2018-03-09 NOTE — Progress Notes (Signed)
1 Day Post-Op  Subjective: Status post laparoscopic cholecystectomy and right femoral dialysis catheter placement. Patient feels better today with minimal pain.  No nausea or vomiting  Objective: Vital signs in last 24 hours: Temp:  [97.1 F (36.2 C)-97.8 F (36.6 C)] 97.8 F (36.6 C) (09/14 0430) Pulse Rate:  [87-118] 89 (09/14 0430) Resp:  [12-34] 17 (09/14 0430) BP: (102-152)/(69-101) 102/78 (09/14 0430) SpO2:  [93 %-100 %] 96 % (09/14 0430)    Intake/Output from previous day: 09/13 0701 - 09/14 0700 In: 623.2 [I.V.:573.2; IV Piggyback:50] Out: 210 [Urine:200; Blood:10] Intake/Output this shift: Total I/O In: 411.8 [I.V.:361.8; IV Piggyback:50] Out: -   Physical exam:  Vital signs are stable afebrile Abdomen is soft minimally tender wounds are clean no erythema no drainage PD catheter in the left upper quadrant.  Right groin catheter present.  Lab Results: CBC  Recent Labs    03/08/18 0523 03/09/18 0427  WBC 10.0 12.1*  HGB 10.6* 10.2*  HCT 32.1* 30.6*  PLT 249 255   BMET Recent Labs    03/08/18 0523 03/09/18 0427  NA 141 136  K 4.7 5.7*  CL 108 109  CO2 25 21*  GLUCOSE 97 212*  BUN 70* 81*  CREATININE 6.41* 7.32*  CALCIUM 8.2* 8.1*   PT/INR No results for input(s): LABPROT, INR in the last 72 hours. ABG No results for input(s): PHART, HCO3 in the last 72 hours.  Invalid input(s): PCO2, PO2  Studies/Results: Ct Abdomen Pelvis Wo Contrast  Result Date: 03/07/2018 CLINICAL DATA:  Hernia, low-grade temperature, nausea and pain for 4 days. Renal failure. EXAM: CT ABDOMEN AND PELVIS WITHOUT CONTRAST TECHNIQUE: Multidetector CT imaging of the abdomen and pelvis was performed following the standard protocol without IV contrast. COMPARISON:  CT abdomen dated 09/23/2017. FINDINGS: Lower chest: No acute abnormality. Hepatobiliary: Gallbladder is moderately distended and there is probable gallbladder wall thickening and/or pericholecystic edema. Small stones  are now seen at the expected level of the cystic duct (series 2, images 23 and 24), a new finding compared to the earlier CT of 09/23/2017. Several additional gallstones are again seen in the region of the gallbladder neck. No common bile duct or intrahepatic bile duct dilatation seen. No focal liver abnormality. Pancreas: Partially infiltrated with fat but otherwise unremarkable. Spleen: Normal in size without focal abnormality. Adrenals/Urinary Tract: RIGHT adrenal gland is decreased in size, consistent with expected evolution of the previous adrenal hematoma. LEFT adrenal gland appears stable. Embolization coils again noted at the LEFT renal hilum/pelvis. RIGHT renal cyst is stable. No new renal abnormality. No renal stone or hydronephrosis. No ureteral or bladder calculi identified. Bladder appears normal. Stomach/Bowel: No dilated large or small bowel loops. No evidence of acute bowel wall inflammation. Mild diverticulosis of the sigmoid and descending colon but no focal inflammatory change to suggest acute diverticulitis. Stomach is unremarkable, partially decompressed. Appendix is not seen. Vascular/Lymphatic: Again noted are mildly prominent lymph nodes scattered throughout the retroperitoneum, not significantly changed for over 2 years indicating benignity. Scattered aortic atherosclerosis. Reproductive: Prostate is unremarkable. Other: Small amount of free fluid again noted adjacent to the liver. No abscess collection. No free intraperitoneal air. Peritoneal dialysis catheter appears stable in position with the tip positioned in the RIGHT lower quadrant. Stable small periumbilical abdominal wall hernia containing fat only. Musculoskeletal: No acute or suspicious osseous finding. IMPRESSION: 1. Findings highly suggestive of acute cholecystitis. Gallbladder is moderately distended with probable gallbladder wall thickening and/or pericholecystic edema. Associated small stones are now appreciated at  the level  of the cystic duct, presumably obstructive. Additional small gallstones again noted in the gallbladder neck region. Consider RIGHT upper quadrant ultrasound and/or nuclear medicine HIDA scan for confirmation. 2. Small amount of free fluid in the RIGHT upper quadrant. No abscess collection seen. 3. Colonic diverticulosis without evidence of acute diverticulitis. 4. Stable small periumbilical abdominal wall hernia, containing mesenteric fat only. No herniated bowel. 5.  Aortic Atherosclerosis (ICD10-I70.0). 6. Additional chronic/incidental findings detailed above. Electronically Signed   By: Franki Cabot M.D.   On: 03/07/2018 15:32   US Abdomen Limited Ruq  Result Date: 03/07/2018 CLINICAL DATA:  Right upper quadrant pain EXAM: ULTRASOUND ABDOMEN LIMITED RIGHT UPPER QUADRANT COMPARISON:  CT scan from earlier today. FINDINGS: Gallbladder: Multiple gallstones identified, measuring up to 9 mm. Gallbladder wall is thickened at 4 mm. Striated gallbladder wall appearance is consistent with edema. Sonographer reports no sonographic Murphy sign. Common bile duct: Diameter: 4 mm Liver: Increased echogenicity of the liver parenchyma suggests fatty deposition. Portal vein is patent on color Doppler imaging with normal direction of blood flow towards the liver. IMPRESSION: Gallstones with gallbladder wall thickening and apparent gallbladder wall edema. Sonographic imaging features are highly suggestive of acute cholecystitis. Electronically Signed   By: Misty Stanley M.D.   On: 03/07/2018 19:10    Anti-infectives: Anti-infectives (From admission, onward)   Start     Dose/Rate Route Frequency Ordered Stop   03/07/18 2200  piperacillin-tazobactam (ZOSYN) IVPB 3.375 g     3.375 g 12.5 mL/hr over 240 Minutes Intravenous Every 12 hours 03/07/18 2100     03/07/18 1945  piperacillin-tazobactam (ZOSYN) IVPB 2.25 g  Status:  Discontinued     2.25 g 100 mL/hr over 30 Minutes Intravenous  Once 03/07/18 1932 03/07/18 2100       Assessment/Plan: s/p Procedure(s): [facility]/PERMA CATHETER INSERTION   Patient doing very well postop laparoscopic cholecystectomy and catheter placement for dialysis.  Labs are reviewed.  He will likely need dialysis today.  Will advance diet.  Discharge plans per prime doc.  Florene Glen, MD, FACS  03/09/2018

## 2018-03-09 NOTE — Plan of Care (Signed)
  Problem: Pain Managment: Goal: General experience of comfort will improve Outcome: Progressing   

## 2018-03-09 NOTE — Progress Notes (Signed)
This note also relates to the following rows which could not be included: Pulse Rate - Cannot attach notes to unvalidated device data Resp - Cannot attach notes to unvalidated device data SpO2 - Cannot attach notes to unvalidated device data  Hd started  

## 2018-03-09 NOTE — Progress Notes (Signed)
Patient arrived post dialysis. VSS. Wife at bedside. Continue to monitor.

## 2018-03-09 NOTE — Progress Notes (Signed)
[facility]  ROUNDING NOTE   Subjective:  Patient seen and evaluated during hemodialysis. Tolerating well. Underwent successful lap scopic cholecystectomy yesterday. Femoral dialysis catheter also placed. Pain control is good.  Objective:  Vital signs in last 24 hours:  Temp:  [97.1 F (36.2 C)-98.7 F (37.1 C)] 98.7 F (37.1 C) (09/14 1150) Pulse Rate:  [79-118] 79 (09/14 1440) Resp:  [11-34] 13 (09/14 1440) BP: (87-152)/(68-101) 93/71 (09/14 1430) SpO2:  [93 %-100 %] 100 % (09/14 0908)  Weight change:  Filed Weights   03/07/18 1216 03/07/18 2229  Weight: 86.2 kg 85.7 kg    Intake/Output: I/O last 3 completed shifts: In: 866.7 [I.V.:716.7; IV Piggyback:150] Out: 510 [Urine:500; Blood:10]   Intake/Output this shift:  Total I/O In: 891.8 [P.O.:480; I.V.:361.8; IV Piggyback:50] Out: -   Physical Exam: General: No acute distress  Head: Normocephalic, atraumatic. Moist oral mucosal membranes  Eyes: Anicteric  Neck: Supple, trachea midline  Lungs:  Clear to auscultation, normal effort  Heart: S1S2 no rubs  Abdomen:  Soft, mild RUQ tenderness, BS present  Extremities: Trace peripheral edema.  Neurologic: Awake, alert, following commands  Skin: No lesions  Access: PD catheter in place, femoral dialysis catheter in place    Basic Metabolic Panel: Recent Labs  Lab 03/07/18 1218 03/08/18 0523 03/09/18 0427  NA 138 141 136  K 4.6 4.7 5.7*  CL 102 108 109  CO2 25 25 21*  GLUCOSE 104* 97 212*  BUN 61* 70* 81*  CREATININE 6.18* 6.41* 7.32*  CALCIUM 8.7* 8.2* 8.1*  MG  --  1.5* 1.5*  PHOS  --  5.1*  --     Liver Function Tests: Recent Labs  Lab 03/07/18 1218 03/08/18 0523  AST 26 27  ALT 31 29  ALKPHOS 190* 176*  BILITOT 0.6 0.7  PROT 7.3 6.0*  ALBUMIN 3.2* 2.6*   No results for input(s): LIPASE, AMYLASE in the last 168 hours. No results for input(s): AMMONIA in the last 168 hours.  CBC: Recent Labs  Lab 03/07/18 1218  03/08/18 0523 03/09/18 0427  WBC 12.6* 10.0 12.1*  NEUTROABS 8.3*  --   --   HGB 12.0* 10.6* 10.2*  HCT 36.1* 32.1* 30.6*  MCV 91.2 91.8 90.1  PLT 286 249 255    Cardiac Enzymes: No results for input(s): CKTOTAL, CKMB, CKMBINDEX, TROPONINI in the last 168 hours.  BNP: Invalid input(s): POCBNP  CBG: No results for input(s): GLUCAP in the last 168 hours.  Microbiology: Results for orders placed or performed during the hospital encounter of 03/07/18  Culture, blood (routine x 2)     Status: None (Preliminary result)   Collection Time: 03/07/18  2:51 PM  Result Value Ref Range Status   Specimen Description BLOOD RIGHT [facility]  Final   Special Requests   Final    BOTTLES DRAWN AEROBIC AND ANAEROBIC Blood Culture adequate volume   Culture   Final    NO GROWTH 2 DAYS Performed at [facility], [address] [place], [place] [address]    Report Status PENDING  Incomplete  Culture, blood (routine x 2)     Status: None (Preliminary result)   Collection Time: 03/07/18  2:51 PM  Result Value Ref Range Status   Specimen Description BLOOD LEFT AC  Final   Special Requests   Final    BOTTLES DRAWN AEROBIC AND ANAEROBIC Blood Culture results may not be optimal due to an inadequate volume of blood received in culture bottles   Culture  Final    NO GROWTH 2 DAYS Performed at [facility], [address] [place], [place] [address]    Report Status PENDING  Incomplete  Body fluid culture     Status: None (Preliminary result)   Collection Time: 03/07/18  5:49 PM  Result Value Ref Range Status   Specimen Description   Final    PERITONEAL DIALYSATE Performed at [facility], [address], [place], [place] [address]    Special Requests   Final    NONE Performed at [facility], [address] [place], [place] [address]    Gram Stain   Final    FEW WBC PRESENT,BOTH PMN AND MONONUCLEAR NO ORGANISMS SEEN    Culture   Final    NO GROWTH 2  DAYS Performed at [facility], [place] [address], [place], [place] [address]    Report Status PENDING  Incomplete  MRSA PCR Screening     Status: None   Collection Time: 03/08/18  6:22 AM  Result Value Ref Range Status   MRSA by PCR NEGATIVE NEGATIVE Final    Comment:        The GeneXpert MRSA Assay (FDA approved for NASAL specimens only), is one component of a comprehensive MRSA colonization surveillance program. It is not intended to diagnose MRSA infection nor to guide or monitor treatment for MRSA infections. Performed at [facility], [address] [place], [place] [address]     Coagulation Studies: No results for input(s): LABPROT, INR in the last 72 hours.  Urinalysis: No results for input(s): COLORURINE, LABSPEC, PHURINE, GLUCOSEU, HGBUR, BILIRUBINUR, KETONESUR, PROTEINUR, UROBILINOGEN, NITRITE, LEUKOCYTESUR in the last 72 hours.  Invalid input(s): APPERANCEUR    Imaging: Ct Abdomen Pelvis Wo Contrast  Result Date: 03/07/2018 CLINICAL DATA:  Hernia, low-grade temperature, nausea and pain for 4 days. Renal failure. EXAM: CT ABDOMEN AND PELVIS WITHOUT CONTRAST TECHNIQUE: Multidetector CT imaging of the abdomen and pelvis was performed following the standard protocol without IV contrast. COMPARISON:  CT abdomen dated 09/23/2017. FINDINGS: Lower chest: No acute abnormality. Hepatobiliary: Gallbladder is moderately distended and there is probable gallbladder wall thickening and/or pericholecystic edema. Small stones are now seen at the expected level of the cystic duct (series 2, images 23 and 24), a new finding compared to the earlier CT of 09/23/2017. Several additional gallstones are again seen in the region of the gallbladder neck. No common bile duct or intrahepatic bile duct dilatation seen. No focal liver abnormality. Pancreas: Partially infiltrated with fat but otherwise unremarkable. Spleen: Normal in size without focal abnormality. Adrenals/Urinary  Tract: RIGHT adrenal gland is decreased in size, consistent with expected evolution of the previous adrenal hematoma. LEFT adrenal gland appears stable. Embolization coils again noted at the LEFT renal hilum/pelvis. RIGHT renal cyst is stable. No new renal abnormality. No renal stone or hydronephrosis. No ureteral or bladder calculi identified. Bladder appears normal. Stomach/Bowel: No dilated large or small bowel loops. No evidence of acute bowel wall inflammation. Mild diverticulosis of the sigmoid and descending colon but no focal inflammatory change to suggest acute diverticulitis. Stomach is unremarkable, partially decompressed. Appendix is not seen. Vascular/Lymphatic: Again noted are mildly prominent lymph nodes scattered throughout the retroperitoneum, not significantly changed for over 2 years indicating benignity. Scattered aortic atherosclerosis. Reproductive: Prostate is unremarkable. Other: Small amount of free fluid again noted adjacent to the liver. No abscess collection. No free intraperitoneal air. Peritoneal dialysis catheter appears stable in position with the tip positioned in the  RIGHT lower quadrant. Stable small periumbilical abdominal wall hernia containing fat only. Musculoskeletal: No acute or suspicious osseous finding. IMPRESSION: 1. Findings highly suggestive of acute cholecystitis. Gallbladder is moderately distended with probable gallbladder wall thickening and/or pericholecystic edema. Associated small stones are now appreciated at the level of the cystic duct, presumably obstructive. Additional small gallstones again noted in the gallbladder neck region. Consider RIGHT upper quadrant ultrasound and/or nuclear medicine HIDA scan for confirmation. 2. Small amount of free fluid in the RIGHT upper quadrant. No abscess collection seen. 3. Colonic diverticulosis without evidence of acute diverticulitis. 4. Stable small periumbilical abdominal wall hernia, containing mesenteric fat only. No  herniated bowel. 5.  Aortic Atherosclerosis (ICD10-I70.0). 6. Additional chronic/incidental findings detailed above. Electronically Signed   By: Franki Cabot M.D.   On: 03/07/2018 15:32   US Abdomen Limited Ruq  Result Date: 03/07/2018 CLINICAL DATA:  Right upper quadrant pain EXAM: ULTRASOUND ABDOMEN LIMITED RIGHT UPPER QUADRANT COMPARISON:  CT scan from earlier today. FINDINGS: Gallbladder: Multiple gallstones identified, measuring up to 9 mm. Gallbladder wall is thickened at 4 mm. Striated gallbladder wall appearance is consistent with edema. Sonographer reports no sonographic Murphy sign. Common bile duct: Diameter: 4 mm Liver: Increased echogenicity of the liver parenchyma suggests fatty deposition. Portal vein is patent on color Doppler imaging with normal direction of blood flow towards the liver. IMPRESSION: Gallstones with gallbladder wall thickening and apparent gallbladder wall edema. Sonographic imaging features are highly suggestive of acute cholecystitis. Electronically Signed   By: Misty Stanley M.D.   On: 03/07/2018 19:10     Medications:   . dextrose 5 % and 0.9% NaCl 50 mL/hr at 03/09/18 0748  . [START ON 03/10/2018] famotidine (PEPCID) IV    . magnesium sulfate 1 - 4 g bolus IVPB    . piperacillin-tazobactam (ZOSYN)  IV 3.375 g (03/09/18 0939)   . cycloSPORINE  1 drop Both Eyes Daily  . ezetimibe  10 mg Oral Daily  . folic acid  5 mg Oral Daily  . gentamicin cream   Topical QHS  . heparin  5,000 Units Subcutaneous Q8H  . patiromer  1 packet Oral Daily   albuterol, hydrALAZINE, HYDROcodone-acetaminophen, HYDROmorphone, ondansetron **OR** ondansetron (ZOFRAN) IV, oxyCODONE, polyvinyl alcohol  Assessment/ Plan:  51 y.o. male with end stage renal disease on peritoneal dialysis, amyloidosis, hypertension, hyperlipidemia, peptic ulcer disease, right adrenal hemorrhage, acute cholecystitis 9/19  1. End stage renal disease: on peritoneal dialysis.  Patient has acute  cholecystitis and will be underwent laparoscopic cholecystectomy on March 08, 2018.  -Given the fact that he underwent laparoscopic cholecystectomy we now need to hold peritoneal dialysis for several weeks.  Temporary dialysis catheter placed yesterday.  Patient undergoing dialysis at the moment.  We will plan for PermCath early next week.  2.  Acute cholecystitis.  Patient status post laparoscopic cholecystectomy 03/08/2018.  Surgery went well.  3.  Anemia of chronic kidney disease.  We will likely need to start the patient on Epogen during outpatient dialysis.  4.  Secondary hyperparathyroidism.  Phosphorus 5.1 and at target.  Continue to periodically monitor.  5.  Hyperkalemia.  Maintain the patient on Veltassa 1 packet p.o. daily.  Potassium a bit high at 5.7 today.   LOS: 2 Kymberli Wiegand 9/14/20192:58 PM

## 2018-03-10 LAB — RENAL FUNCTION PANEL
ALBUMIN: 2.5 g/dL — AB (ref 3.5–5.0)
Anion gap: 9 (ref 5–15)
BUN: 63 mg/dL — AB (ref 6–20)
CO2: 27 mmol/L (ref 22–32)
CREATININE: 7.04 mg/dL — AB (ref 0.61–1.24)
Calcium: 8 mg/dL — ABNORMAL LOW (ref 8.9–10.3)
Chloride: 101 mmol/L (ref 98–111)
GFR calc Af Amer: 9 mL/min — ABNORMAL LOW (ref >60)
GFR calc non Af Amer: 8 mL/min — ABNORMAL LOW (ref >60)
Glucose, Bld: 100 mg/dL — ABNORMAL HIGH (ref 70–99)
PHOSPHORUS: 4.7 mg/dL — AB (ref 2.5–4.6)
POTASSIUM: 3.8 mmol/L (ref 3.5–5.1)
Sodium: 137 mmol/L (ref 135–145)

## 2018-03-10 MED ORDER — BISACODYL 10 MG RE SUPP
10.0000 mg | Freq: Once | RECTAL | Status: AC
  Administered 2018-03-10: 10 mg via RECTAL
  Filled 2018-03-10: qty 1

## 2018-03-10 MED ORDER — ALUM & MAG HYDROXIDE-SIMETH 200-200-20 MG/5ML PO SUSP
30.0000 mL | Freq: Four times a day (QID) | ORAL | Status: DC | PRN
  Administered 2018-03-10: 30 mL via ORAL
  Filled 2018-03-10: qty 30

## 2018-03-10 NOTE — Progress Notes (Signed)
[facility]  ROUNDING NOTE   Subjective:  Patient doing well status post cholecystectomy. Had hemodialysis yesterday. We will plan for hemodialysis again tomorrow. Patient will also need PermCath placement tomorrow.  Objective:  Vital signs in last 24 hours:  Temp:  [97.9 F (36.6 C)-98.8 F (37.1 C)] 98.3 F (36.8 C) (09/15 0845) Pulse Rate:  [79-90] 83 (09/15 0845) Resp:  [11-20] 20 (09/15 0845) BP: (87-111)/(67-76) 111/76 (09/15 0845) SpO2:  [95 %-97 %] 96 % (09/15 0845)  Weight change:  Filed Weights   03/07/18 1216 03/07/18 2229  Weight: 86.2 kg 85.7 kg    Intake/Output: I/O last 3 completed shifts: In: 1245.2 [P.O.:720; I.V.:454.3; IV Piggyback:71] Out: 2130 [Urine:1130; Other:1000]   Intake/Output this shift:  Total I/O In: 656.5 [P.O.:220; I.V.:386.5; IV Piggyback:50] Out: -   Physical Exam: General: No acute distress  Head: Normocephalic, atraumatic. Moist oral mucosal membranes  Eyes: Anicteric  Neck: Supple, trachea midline  Lungs:  Clear to auscultation, normal effort  Heart: S1S2 no rubs  Abdomen:  Soft, mild RUQ tenderness, BS present  Extremities: Trace peripheral edema.  Neurologic: Awake, alert, following commands  Skin: No lesions  Access: PD catheter in place, femoral dialysis catheter in place    Basic Metabolic Panel: Recent Labs  Lab 03/07/18 1218 03/08/18 0523 03/09/18 0427  NA 138 141 136  K 4.6 4.7 5.7*  CL 102 108 109  CO2 25 25 21*  GLUCOSE 104* 97 212*  BUN 61* 70* 81*  CREATININE 6.18* 6.41* 7.32*  CALCIUM 8.7* 8.2* 8.1*  MG  --  1.5* 1.5*  PHOS  --  5.1*  --     Liver Function Tests: Recent Labs  Lab 03/07/18 1218 03/08/18 0523  AST 26 27  ALT 31 29  ALKPHOS 190* 176*  BILITOT 0.6 0.7  PROT 7.3 6.0*  ALBUMIN 3.2* 2.6*   No results for input(s): LIPASE, AMYLASE in the last 168 hours. No results for input(s): AMMONIA in the last 168 hours.  CBC: Recent Labs  Lab 03/07/18 1218 03/08/18 0523  03/09/18 0427  WBC 12.6* 10.0 12.1*  NEUTROABS 8.3*  --   --   HGB 12.0* 10.6* 10.2*  HCT 36.1* 32.1* 30.6*  MCV 91.2 91.8 90.1  PLT 286 249 255    Cardiac Enzymes: No results for input(s): CKTOTAL, CKMB, CKMBINDEX, TROPONINI in the last 168 hours.  BNP: Invalid input(s): POCBNP  CBG: No results for input(s): GLUCAP in the last 168 hours.  Microbiology: Results for orders placed or performed during the hospital encounter of 03/07/18  Culture, blood (routine x 2)     Status: None (Preliminary result)   Collection Time: 03/07/18  2:51 PM  Result Value Ref Range Status   Specimen Description BLOOD RIGHT [facility]  Final   Special Requests   Final    BOTTLES DRAWN AEROBIC AND ANAEROBIC Blood Culture adequate volume   Culture   Final    NO GROWTH 3 DAYS Performed at [facility], [address] [place], [place] [address]    Report Status PENDING  Incomplete  Culture, blood (routine x 2)     Status: None (Preliminary result)   Collection Time: 03/07/18  2:51 PM  Result Value Ref Range Status   Specimen Description BLOOD LEFT AC  Final   Special Requests   Final    BOTTLES DRAWN AEROBIC AND ANAEROBIC Blood Culture results may not be optimal due to an inadequate volume of blood received in culture bottles  Culture   Final    NO GROWTH 3 DAYS Performed at [facility], [address] [place], [place] [address]    Report Status PENDING  Incomplete  Body fluid culture     Status: None (Preliminary result)   Collection Time: 03/07/18  5:49 PM  Result Value Ref Range Status   Specimen Description   Final    PERITONEAL DIALYSATE Performed at [facility], [address] [place], [place] [address]    Special Requests   Final    NONE Performed at [facility], [address] [place], [place] [address]    Gram Stain   Final    FEW WBC PRESENT,BOTH PMN AND MONONUCLEAR NO ORGANISMS SEEN    Culture   Final    NO GROWTH 3 DAYS Performed at  [facility], [place] [address], [place], [place] [address]    Report Status PENDING  Incomplete  MRSA PCR Screening     Status: None   Collection Time: 03/08/18  6:22 AM  Result Value Ref Range Status   MRSA by PCR NEGATIVE NEGATIVE Final    Comment:        The GeneXpert MRSA Assay (FDA approved for NASAL specimens only), is one component of a comprehensive MRSA colonization surveillance program. It is not intended to diagnose MRSA infection nor to guide or monitor treatment for MRSA infections. Performed at [facility], [address] [place], [place] [address]     Coagulation Studies: No results for input(s): LABPROT, INR in the last 72 hours.  Urinalysis: No results for input(s): COLORURINE, LABSPEC, PHURINE, GLUCOSEU, HGBUR, BILIRUBINUR, KETONESUR, PROTEINUR, UROBILINOGEN, NITRITE, LEUKOCYTESUR in the last 72 hours.  Invalid input(s): APPERANCEUR    Imaging: No results found.   Medications:   . dextrose 5 % and 0.9% NaCl 50 mL/hr at 03/10/18 0725  . famotidine (PEPCID) IV    . magnesium sulfate 1 - 4 g bolus IVPB    . piperacillin-tazobactam (ZOSYN)  IV 3.375 g (03/10/18 0915)   . atorvastatin  20 mg Oral q1800  . cycloSPORINE  1 drop Both Eyes Daily  . ezetimibe  10 mg Oral Daily  . folic acid  5 mg Oral Daily  . gentamicin cream   Topical QHS  . heparin  5,000 Units Subcutaneous Q8H  . losartan  50 mg Oral Daily  . pantoprazole  40 mg Oral Daily  . patiromer  1 packet Oral Daily   albuterol, alum & mag hydroxide-simeth, hydrALAZINE, HYDROcodone-acetaminophen, HYDROmorphone, ondansetron **OR** ondansetron (ZOFRAN) IV, oxyCODONE, polyvinyl alcohol  Assessment/ Plan:  52 y.o. male with end stage renal disease on peritoneal dialysis, amyloidosis, hypertension, hyperlipidemia, peptic ulcer disease, right adrenal hemorrhage, acute cholecystitis 9/19  1. End stage renal disease: on peritoneal dialysis.  Patient has acute cholecystitis and will be  underwent laparoscopic cholecystectomy on March 08, 2018.  -We will plan for hemodialysis again tomorrow.  We will request vascular surgery to place PermCath tomorrow.  N.p.o. order after midnight placed.  2.  Acute cholecystitis.  Patient status post laparoscopic cholecystectomy 03/08/2018.  Surgery went well.  3.  Anemia of chronic kidney disease.  We will transition the patient to Epogen with dialysis as an outpatient.  4.  Secondary hyperparathyroidism.  Recheck serum phosphorus tomorrow.  5.  Hyperkalemia.  Maintain the patient on Veltassa 1 packet p.o. daily.    LOS: 3 Joseph [name] 9/15/201912:40 PM

## 2018-03-10 NOTE — Progress Notes (Signed)
Vascular Surgery  Patient seen and examined- prior consult per Dr. Payton Mccallum 03/08/2018.  Will plan for Permacath tomorrow per Dr. Lucky Cowboy.  NPO after MN.

## 2018-03-10 NOTE — Plan of Care (Signed)
  Problem: Pain Managment: Goal: General experience of comfort will improve Outcome: Progressing   

## 2018-03-10 NOTE — Progress Notes (Signed)
2 Days Post-Op  Subjective: Patient status post laparoscopic cholecystectomy and catheter placement.  He tolerated dialysis yesterday quite well.  His chief complaint today is that he is having trouble urinating and has required in and out catheterization.  He also feels bloated and is not had a bowel movement since before surgery.  Objective: Vital signs in last 24 hours: Temp:  [97.9 F (36.6 C)-98.8 F (37.1 C)] 98.3 F (36.8 C) (09/15 0845) Pulse Rate:  [79-97] 83 (09/15 0845) Resp:  [11-22] 20 (09/15 0845) BP: (87-111)/(67-76) 111/76 (09/15 0845) SpO2:  [95 %-97 %] 96 % (09/15 0845)    Intake/Output from previous day: 09/14 0701 - 09/15 0700 In: 1245.2 [P.O.:720; I.V.:454.3; IV Piggyback:71] Out: 1930 [Urine:930] Intake/Output this shift: Total I/O In: 656.5 [P.O.:220; I.V.:386.5; IV Piggyback:50] Out: -   Physical exam:  Distended abdomen nontender wounds are clean no erythema no drainage no ecchymosis PD catheter in place right groin catheter in place  Lab Results: CBC  Recent Labs    03/08/18 0523 03/09/18 0427  WBC 10.0 12.1*  HGB 10.6* 10.2*  HCT 32.1* 30.6*  PLT 249 255   BMET Recent Labs    03/08/18 0523 03/09/18 0427  NA 141 136  K 4.7 5.7*  CL 108 109  CO2 25 21*  GLUCOSE 97 212*  BUN 70* 81*  CREATININE 6.41* 7.32*  CALCIUM 8.2* 8.1*   PT/INR No results for input(s): LABPROT, INR in the last 72 hours. ABG No results for input(s): PHART, HCO3 in the last 72 hours.  Invalid input(s): PCO2, PO2  Studies/Results: No results found.  Anti-infectives: Anti-infectives (From admission, onward)   Start     Dose/Rate Route Frequency Ordered Stop   03/07/18 2200  piperacillin-tazobactam (ZOSYN) IVPB 3.375 g     3.375 g 12.5 mL/hr over 240 Minutes Intravenous Every 12 hours 03/07/18 2100     03/07/18 1945  piperacillin-tazobactam (ZOSYN) IVPB 2.25 g  Status:  Discontinued     2.25 g 100 mL/hr over 30 Minutes Intravenous  Once 03/07/18 1932  03/07/18 2100      Assessment/Plan: s/p Procedure(s): [facility]/PERMA CATHETER INSERTION   Patient requesting Maalox with simethicone for gas and I will give him a suppository to enhance or stimulate bowel movement.  Probably home tomorrow.  Florene Glen, MD, FACS  03/10/2018

## 2018-03-10 NOTE — Progress Notes (Signed)
Spoke with Dr. Burt Knack pt showing 317 on the bladder scan and not having any urge to void. Per Dr. Burt Knack continue to monitor if pt is not able to void may use in and out catheter.

## 2018-03-10 NOTE — Progress Notes (Signed)
Pt noticing some swelling to his abdomen and stating that he has not been able to void like he normaly does. Bladder scan showing greater than 914ml spoke with Dr. Duane Boston she would like in and out cath and repeat in 8 hours if needed.

## 2018-03-11 ENCOUNTER — Encounter
Admission: EM | Disposition: A | Discharge status: Home / Self Care | Payer: Self-pay | Source: Home / Self Care | Attending: Surgery

## 2018-03-11 ENCOUNTER — Encounter: Payer: Self-pay | Admitting: Surgery

## 2018-03-11 ENCOUNTER — Inpatient Hospital Stay: Payer: Medicare Other

## 2018-03-11 DIAGNOSIS — N186 End stage renal disease: Secondary | ICD-10-CM

## 2018-03-11 HISTORY — PX: DIALYSIS/PERMA CATHETER INSERTION: CATH118288

## 2018-03-11 LAB — BODY FLUID CULTURE: CULTURE: NO GROWTH

## 2018-03-11 LAB — HEPATITIS B SURFACE ANTIGEN: Hepatitis B Surface Ag: NEGATIVE

## 2018-03-11 SURGERY — DIALYSIS/PERMA CATHETER INSERTION
Anesthesia: Moderate Sedation

## 2018-03-11 MED ORDER — LIDOCAINE-EPINEPHRINE (PF) 1 %-1:200000 IJ SOLN
INTRAMUSCULAR | Status: AC
  Filled 2018-03-11: qty 30

## 2018-03-11 MED ORDER — MIDAZOLAM HCL 5 MG/5ML IJ SOLN
INTRAMUSCULAR | Status: AC
  Filled 2018-03-11: qty 5

## 2018-03-11 MED ORDER — FENTANYL CITRATE (PF) 100 MCG/2ML IJ SOLN
INTRAMUSCULAR | Status: AC
  Filled 2018-03-11: qty 2

## 2018-03-11 MED ORDER — SODIUM CHLORIDE 0.9 % IV SOLN
INTRAVENOUS | Status: DC
  Administered 2018-03-11: 10:00:00 via INTRAVENOUS

## 2018-03-11 MED ORDER — HEPARIN (PORCINE) IN NACL 1000-0.9 UT/500ML-% IV SOLN
INTRAVENOUS | Status: AC
  Filled 2018-03-11: qty 500

## 2018-03-11 MED ORDER — MIDAZOLAM HCL 2 MG/2ML IJ SOLN
INTRAMUSCULAR | Status: DC | PRN
  Administered 2018-03-11: 2 mg via INTRAVENOUS

## 2018-03-11 MED ORDER — FENTANYL CITRATE (PF) 100 MCG/2ML IJ SOLN
INTRAMUSCULAR | Status: DC | PRN
  Administered 2018-03-11: 50 ug via INTRAVENOUS

## 2018-03-11 MED ORDER — HEPARIN SODIUM (PORCINE) 10000 UNIT/ML IJ SOLN
INTRAMUSCULAR | Status: AC
  Filled 2018-03-11: qty 1

## 2018-03-11 SURGICAL SUPPLY — 5 items
BIOPATCH RED 1 DISK 7.0 (GAUZE/BANDAGES/DRESSINGS) ×2 IMPLANT
BIOPATCH RED 1IN DISK 7.0MM (GAUZE/BANDAGES/DRESSINGS) ×1
CATH PALINDROME RT-P 15FX19CM (CATHETERS) ×3 IMPLANT
PACK ANGIOGRAPHY (CUSTOM PROCEDURE TRAY) ×3 IMPLANT
SUT MNCRL AB 4-0 PS2 18 (SUTURE) ×3 IMPLANT

## 2018-03-11 NOTE — Anesthesia Postprocedure Evaluation (Signed)
Anesthesia Post Note  Patient: Joseph [name]  Procedure(s) Performed: LAPAROSCOPIC CHOLECYSTECTOMY (N/A ) HERNIA REPAIR VENTRAL ADULT DIALYSIS/PERMA CATHETER INSERTION  Patient location during evaluation: PACU Anesthesia Type: General Level of consciousness: awake and alert Pain management: pain level controlled Vital Signs Assessment: post-procedure vital signs reviewed and stable Respiratory status: spontaneous breathing, nonlabored ventilation and respiratory function stable Cardiovascular status: blood pressure returned to baseline and stable Postop Assessment: no apparent nausea or vomiting Anesthetic complications: no     Last Vitals:  Vitals:   03/11/18 1530 03/11/18 1545  BP: 119/88 126/87  Pulse: 80 80  Resp: 18 16  Temp:    SpO2: 95% 95%    Last Pain:  Vitals:   03/11/18 1507  TempSrc: Oral  PainSc: 0-No pain                 Durenda Hurt

## 2018-03-11 NOTE — Progress Notes (Signed)
HD tx end    03/11/18 1756  Vital Signs  Pulse Rate 81  Pulse Rate Source Monitor  Resp 15  BP (!) 139/92  BP Location Left Arm  BP Method Automatic  Patient Position (if appropriate) Lying  Oxygen Therapy  SpO2 100 %  O2 Device Room Air  During Hemodialysis Assessment  Dialysis Fluid Bolus Normal Saline  Bolus Amount (mL) 250 mL  Intra-Hemodialysis Comments Tx completed

## 2018-03-11 NOTE — Progress Notes (Signed)
HD tx start    03/11/18 1517  Vital Signs  Pulse Rate 78  Pulse Rate Source Monitor  Resp 16  BP 128/86  BP Location Left Arm  BP Method Automatic  Patient Position (if appropriate) Lying  Oxygen Therapy  SpO2 95 %  O2 Device Room Air  During Hemodialysis Assessment  Blood Flow Rate (mL/min) 400 mL/min  Arterial Pressure (mmHg) -130 mmHg  Venous Pressure (mmHg) 120 mmHg  Transmembrane Pressure (mmHg) 60 mmHg  Ultrafiltration Rate (mL/min) 570 mL/min  Dialysate Flow Rate (mL/min) 800 ml/min  Conductivity: Machine  13.5  HD Safety Checks Performed Yes  Dialysis Fluid Bolus Normal Saline  Bolus Amount (mL) 250 mL  Intra-Hemodialysis Comments Tx initiated  Hemodialysis Catheter Right Femoral vein Triple-lumen  Placement Date/Time: 03/08/18 1825   Time Out: Correct patient;Correct site;Correct procedure  Maximum sterile barrier precautions: Hand hygiene;Mask;Sterile gloves;Sterile gown;Cap;Large sterile sheet  Site Prep: Chlorhexidine;Skin Prep Completely Dr...  Blue Lumen Status Infusing  Red Lumen Status Infusing

## 2018-03-11 NOTE — Progress Notes (Signed)
Pre HD assessment   03/11/18 1507  Vital Signs  Temp 98.9 F (37.2 C)  Temp Source Oral  Pulse Rate 77  Pulse Rate Source Monitor  Resp 16  BP 129/90  BP Location Left Arm  BP Method Automatic  Patient Position (if appropriate) Lying  Oxygen Therapy  SpO2 95 %  O2 Device Room Air  Pain Assessment  Pain Scale 0-10  Pain Score 0  Dialysis Weight  Weight 90.3 kg  Type of Weight Pre-Dialysis  Time-Out for Hemodialysis  What Procedure? HD  Pt Identifiers(min of two) First/Last Name;MRN/Account#  Correct Site? Yes  Correct Side? Yes  Correct Procedure? Yes  Consents Verified? Yes  Rad Studies Available? N/A  Safety Precautions Reviewed? Yes  Engineer, civil (consulting) Number  (3A)  Station Number 1  UF/Alarm Test Passed  Conductivity: Meter 13.6  Conductivity: Machine  13.6  pH 7.6  Reverse Osmosis main  Normal Saline Lot Number [id number]  Dialyzer Lot Number [id number]  Disposable Set Lot Number [id number]  Machine Temperature 98.6 F (37 C)  Musician and Audible Yes  Blood Lines Intact and Secured Yes  Pre Treatment Patient Checks  Vascular access used during treatment Catheter  Hepatitis B Surface Antigen Results Negative  Date Hepatitis B Surface Antigen Drawn 03/09/18  Hepatitis B Surface Antibody  (<10)  Date Hepatitis B Surface Antibody Drawn 01/31/17  Hemodialysis Consent Verified Yes  Hemodialysis Standing Orders Initiated Yes  ECG (Telemetry) Monitor On Yes  Prime Ordered Normal Saline  Length of  DialysisTreatment -hour(s) 3.5 Hour(s)  Dialyzer Elisio 17H NR  Dialysate 2K, 2.5 Ca  Dialysis Anticoagulant None  Dialysate Flow Ordered 800  Blood Flow Rate Ordered 400 mL/min  Ultrafiltration Goal 1.5 Liters  Pre Treatment Labs Phosphorus;CBC  Dialysis Blood Pressure Support Ordered Normal Saline  Education / Care Plan  Dialysis Education Provided Yes  Documented Education in Care Plan Yes  Hemodialysis Catheter Right Femoral vein Triple-lumen   Placement Date/Time: 03/08/18 1825   Time Out: Correct patient;Correct site;Correct procedure  Maximum sterile barrier precautions: Hand hygiene;Mask;Sterile gloves;Sterile gown;Cap;Large sterile sheet  Site Prep: Chlorhexidine;Skin Prep Completely Dr...  Site Condition No complications  Blue Lumen Status Heparin locked  Red Lumen Status Heparin locked  Purple Lumen Status N/A  Dressing Type Gauze/Drain sponge  Dressing Status Clean;Dry;Intact  Drainage Description None

## 2018-03-11 NOTE — Progress Notes (Signed)
Pharmacy Antibiotic Note  Joseph [name] is a 52 y.o. male admitted on 03/07/2018 with intra abdominal infection .  Pharmacy has been consulted for Zosyn  dosing.  Plan: Zosyn 3.375 gm IV Q12H EI ordered.   Height: 5\' 11"  (180.3 cm) Weight: 188 lb 14.4 oz (85.7 kg) IBW/kg (Calculated) : 75.3  Temp (24hrs), Avg:98.8 F (37.1 C), Min:98.3 F (36.8 C), Max:99.3 F (37.4 C)  Recent Labs  Lab 03/07/18 1218 03/08/18 0523 03/09/18 0427 03/10/18 1306  WBC 12.6* 10.0 12.1*  --   CREATININE 6.18* 6.41* 7.32* 7.04*    Estimated Creatinine Clearance: 12.8 mL/min (A) (by C-G formula based on SCr of 7.04 mg/dL (H)).    Allergies  Allergen Reactions  . Ciprofloxacin Hcl Swelling    High fever  . Doxycycline Hyclate Swelling  . Phenol-Glycerin Swelling    Antimicrobials this admission:  Zosyn 9/12 >>    >>   Dose adjustments this admission:   Microbiology results:  BCx: NGTD  Body fluid culture 9/12: NGTD  MRSA PCR: neg  Thank you for allowing pharmacy to be a part of this patient's care.  Rocky Morel 03/11/2018 7:55 AM

## 2018-03-11 NOTE — Progress Notes (Signed)
Pre HD assessment    03/11/18 1506  Neurological  Level of Consciousness Alert  Orientation Level Oriented X4  Respiratory  Respiratory Pattern Regular;Unlabored  Chest Assessment Chest expansion symmetrical  Cardiac  Pulse Irregular  Jugular Venous Distention (JVD) Yes  Vascular  R Radial Pulse +2  L Radial Pulse +2  Edema Generalized  Integumentary  Integumentary (WDL) X  Skin Color Appropriate for ethnicity  Musculoskeletal  Musculoskeletal (WDL) X  Generalized Weakness Yes  Assistive Device None  GU Assessment  Genitourinary (WDL) X  Genitourinary Symptoms  (HD)  Psychosocial  Psychosocial (WDL) WDL

## 2018-03-11 NOTE — Progress Notes (Signed)
[facility]  ROUNDING NOTE   Subjective:   RIJ permcath placed this morning.   Objective:  Vital signs in last 24 hours:  Temp:  [98.4 F (36.9 C)-99.3 F (37.4 C)] 98.4 F (36.9 C) (09/16 1138) Pulse Rate:  [81-100] 81 (09/16 1138) Resp:  [16-22] 16 (09/16 1138) BP: (104-127)/(76-93) 127/85 (09/16 1138) SpO2:  [90 %-96 %] 94 % (09/16 1138)  Weight change:  Filed Weights   03/07/18 1216 03/07/18 2229  Weight: 86.2 kg 85.7 kg    Intake/Output: I/O last 3 completed shifts: In: 1618.7 [P.O.:460; I.V.:1108.7; IV Piggyback:50] Out: 4401 [Urine:1480]   Intake/Output this shift:  Total I/O In: 795.8 [P.O.:480; I.V.:160.4; IV Piggyback:155.4] Out: -   Physical Exam: General: No acute distress  Head: Normocephalic, atraumatic. Moist oral mucosal membranes  Eyes: Anicteric  Neck: Supple, trachea midline  Lungs:  Clear to auscultation, normal effort  Heart: S1S2 no rubs  Abdomen:  Soft, mild RUQ tenderness, BS present  Extremities: No peripheral edema.  Neurologic: Awake, alert, following commands  Skin: No lesions  Access: PD catheter in place, femoral dialysis catheter, RIJ permcath    Basic Metabolic Panel: Recent Labs  Lab 03/07/18 1218 03/08/18 0523 03/09/18 0427 03/10/18 1306  NA 138 141 136 137  K 4.6 4.7 5.7* 3.8  CL 102 108 109 101  CO2 25 25 21* 27  GLUCOSE 104* 97 212* 100*  BUN 61* 70* 81* 63*  CREATININE 6.18* 6.41* 7.32* 7.04*  CALCIUM 8.7* 8.2* 8.1* 8.0*  MG  --  1.5* 1.5*  --   PHOS  --  5.1*  --  4.7*    Liver Function Tests: Recent Labs  Lab 03/07/18 1218 03/08/18 0523 03/10/18 1306  AST 26 27  --   ALT 31 29  --   ALKPHOS 190* 176*  --   BILITOT 0.6 0.7  --   PROT 7.3 6.0*  --   ALBUMIN 3.2* 2.6* 2.5*   No results for input(s): LIPASE, AMYLASE in the last 168 hours. No results for input(s): AMMONIA in the last 168 hours.  CBC: Recent Labs  Lab 03/07/18 1218 03/08/18 0523 03/09/18 0427  WBC 12.6* 10.0 12.1*   NEUTROABS 8.3*  --   --   HGB 12.0* 10.6* 10.2*  HCT 36.1* 32.1* 30.6*  MCV 91.2 91.8 90.1  PLT 286 249 255    Cardiac Enzymes: No results for input(s): CKTOTAL, CKMB, CKMBINDEX, TROPONINI in the last 168 hours.  BNP: Invalid input(s): POCBNP  CBG: No results for input(s): GLUCAP in the last 168 hours.  Microbiology: Results for orders placed or performed during the hospital encounter of 03/07/18  Culture, blood (routine x 2)     Status: None (Preliminary result)   Collection Time: 03/07/18  2:51 PM  Result Value Ref Range Status   Specimen Description BLOOD RIGHT [facility]  Final   Special Requests   Final    BOTTLES DRAWN AEROBIC AND ANAEROBIC Blood Culture adequate volume   Culture   Final    NO GROWTH 4 DAYS Performed at [facility], [address], [place], [place] [address]    Report Status PENDING  Incomplete  Culture, blood (routine x 2)     Status: None (Preliminary result)   Collection Time: 03/07/18  2:51 PM  Result Value Ref Range Status   Specimen Description BLOOD LEFT AC  Final   Special Requests   Final    BOTTLES DRAWN AEROBIC AND ANAEROBIC Blood Culture results may not  be optimal due to an inadequate volume of blood received in culture bottles   Culture   Final    NO GROWTH 4 DAYS Performed at [facility], [address] [place], [place] [address]    Report Status PENDING  Incomplete  Body fluid culture     Status: None   Collection Time: 03/07/18  5:49 PM  Result Value Ref Range Status   Specimen Description   Final    PERITONEAL DIALYSATE Performed at [facility] [address], [place], [place] [address]    Special Requests   Final    NONE Performed at [facility], [address] [place], [place] [address]    Gram Stain   Final    FEW WBC PRESENT,BOTH PMN AND MONONUCLEAR NO ORGANISMS SEEN    Culture   Final    NO GROWTH 3 DAYS Performed at [facility], [place] [address] [place], [place]  [address]    Report Status 03/11/2018 FINAL  Final  MRSA PCR Screening     Status: None   Collection Time: 03/08/18  6:22 AM  Result Value Ref Range Status   MRSA by PCR NEGATIVE NEGATIVE Final    Comment:        The GeneXpert MRSA Assay (FDA approved for NASAL specimens only), is one component of a comprehensive MRSA colonization surveillance program. It is not intended to diagnose MRSA infection nor to guide or monitor treatment for MRSA infections. Performed at [facility], [address] [place], [place] [address]     Coagulation Studies: No results for input(s): LABPROT, INR in the last 72 hours.  Urinalysis: No results for input(s): COLORURINE, LABSPEC, PHURINE, GLUCOSEU, HGBUR, BILIRUBINUR, KETONESUR, PROTEINUR, UROBILINOGEN, NITRITE, LEUKOCYTESUR in the last 72 hours.  Invalid input(s): APPERANCEUR    Imaging: No results found.   Medications:   . dextrose 5 % and 0.9% NaCl Stopped (03/11/18 0943)  . famotidine (PEPCID) IV Stopped (03/10/18 2135)  . magnesium sulfate 1 - 4 g bolus IVPB    . piperacillin-tazobactam (ZOSYN)  IV 3.375 g (03/11/18 0943)   . atorvastatin  20 mg Oral q1800  . cycloSPORINE  1 drop Both Eyes Daily  . ezetimibe  10 mg Oral Daily  . folic acid  5 mg Oral Daily  . gentamicin cream   Topical QHS  . heparin  5,000 Units Subcutaneous Q8H  . losartan  50 mg Oral Daily  . pantoprazole  40 mg Oral Daily  . patiromer  1 packet Oral Daily   albuterol, alum & mag hydroxide-simeth, hydrALAZINE, HYDROcodone-acetaminophen, HYDROmorphone, ondansetron **OR** ondansetron (ZOFRAN) IV, oxyCODONE, polyvinyl alcohol  Assessment/ Plan:  52 y.o. male with end stage renal disease on peritoneal dialysis, amyloidosis, hypertension, hyperlipidemia, peptic ulcer disease, right adrenal hemorrhage, acute cholecystitis 9/19  1. End stage renal disease: on peritoneal dialysis.  Patient has acute cholecystitis and will be underwent laparoscopic  cholecystectomy on March 08, 2018.  - Transition to back up hemodialysis until abdominal surgery has healed and may return to peritoneal dialysis.  - RIJ permcath placed. Hemodialysis for later today. MWF schedule. Outpatient planning for [facility] - on patiromer (outpatient medication)  2.  Acute cholecystitis.  Patient status post laparoscopic cholecystectomy 03/08/2018.    3.  Anemia of chronic kidney disease.  Hemoglobin 10.2 - EPO as outpatient   4.  Secondary hyperparathyroidism.  Not currently on binders.   5. Hypertension: blood pressure at goal - losartan.  LOS: 4 Geanie Pacifico 9/16/20192:46 PM

## 2018-03-11 NOTE — H&P (Signed)
[facility] History & Physical Update  The patient was interviewed and re-examined.  The patient's previous History and Physical has been reviewed and is unchanged.  There is no change in the plan of care. We plan to proceed with the scheduled procedure.  Leotis Pain, MD  03/11/2018, 10:14 AM

## 2018-03-11 NOTE — Op Note (Signed)
OPERATIVE NOTE    PRE-OPERATIVE DIAGNOSIS: 1. ESRD 2. Recent cholecystitis with inability to do PD  POST-OPERATIVE DIAGNOSIS: same as above  PROCEDURE: 1. Ultrasound guidance for vascular access to the right internal jugular vein 2. Fluoroscopic guidance for placement of catheter 3. Placement of a 19 cm tip to cuff tunneled hemodialysis catheter via the right internal jugular vein  SURGEON: Leotis Pain, MD  ANESTHESIA:  Local with Moderate conscious sedation for approximately 15 minutes using 2 mg of Versed and 50 mcg of Fentanyl  ESTIMATED BLOOD LOSS: 3 cc  FLUORO TIME: less than one minute  CONTRAST: none  FINDING(S): 1.  Patent right internal jugular vein  SPECIMEN(S):  None  INDICATIONS:   Joseph [name] is a 52 y.o.male who presents with renal failure and a recent episode of cholecystitis requiring surgery and cessation of his peritoneal dialysis treatments for some time.  The patient needs long term dialysis access for their ESRD, and a Permcath is necessary.  Risks and benefits are discussed and informed consent is obtained.    DESCRIPTION: After obtaining full informed written consent, the patient was brought back to the vascular suited. The patient's right neck and chest were sterilely prepped and draped in a sterile surgical field was created. Moderate conscious sedation was administered during a face to face encounter with the patient throughout the procedure with my supervision of the RN administering medicines and monitoring the patient's vital signs, pulse oximetry, telemetry and mental status throughout from the start of the procedure until the patient was taken to the recovery room.  The right internal jugular vein was visualized with ultrasound and found to be patent. It was then accessed under direct ultrasound guidance and a permanent image was recorded. A wire was placed. After skin nick and dilatation, the peel-away sheath was placed over the wire. I then turned  my attention to an area under the clavicle. Approximately 1-2 fingerbreadths below the clavicle a small counterincision was created and tunneled from the subclavicular incision to the access site. Using fluoroscopic guidance, a 19 centimeter tip to cuff tunneled hemodialysis catheter was selected, and tunneled from the subclavicular incision to the access site. It was then placed through the peel-away sheath and the peel-away sheath was removed. Using fluoroscopic guidance the catheter tips were parked in the right atrium. The appropriate distal connectors were placed. It withdrew blood well and flushed easily with heparinized saline and a concentrated heparin solution was then placed. It was secured to the chest wall with 2 Prolene sutures. The access incision was closed single 4-0 Monocryl. A 4-0 Monocryl pursestring suture was placed around the exit site. Sterile dressings were placed. The patient tolerated the procedure well and was taken to the recovery room in stable condition.  COMPLICATIONS: None  CONDITION: Stable  Leotis Pain, MD 03/11/2018 10:48 AM   This note was created with Dragon Medical transcription system. Any errors in dictation are purely unintentional.

## 2018-03-11 NOTE — Progress Notes (Signed)
Pt complaining of lower abdominal pain, bladder scan showing greater than 600. Pt unable to void on his own in and out catheter performed per MD. 519ml of urine removed.

## 2018-03-11 NOTE — Progress Notes (Signed)
1st attempt to get report prior to HD tx, RN unavailable.    03/11/18 1335  Hand-Off documentation  Report given to (Full Name) Stark Bray  Report received from (Full Name) Ames Coupe

## 2018-03-11 NOTE — Progress Notes (Signed)
Post HD assessment. Pt tolerated tx well without c/o or complication. Net UF 947, pt off early per MD orders, goal not met.    03/11/18 1803  Vital Signs  Temp 98.4 F (36.9 C)  Temp Source Oral  Pulse Rate 82  Pulse Rate Source Monitor  Resp 16  BP (!) 134/93  BP Location Left Arm  BP Method Automatic  Patient Position (if appropriate) Lying  Oxygen Therapy  SpO2 100 %  O2 Device Room Air  Dialysis Weight  Weight 88.3 kg  Type of Weight Post-Dialysis  Post-Hemodialysis Assessment  Rinseback Volume (mL) 250 mL  KECN 44.8 V  Dialyzer Clearance Lightly streaked  Duration of HD Treatment -hour(s) 2.5 hour(s)  Hemodialysis Intake (mL) 500 mL  UF Total -Machine (mL) 1447 mL  Net UF (mL) 947 mL  Tolerated HD Treatment Yes  Education / Care Plan  Dialysis Education Provided Yes  Documented Education in Care Plan Yes  Hemodialysis Catheter Right Femoral vein Triple-lumen  Placement Date/Time: 03/08/18 1825   Time Out: Correct patient;Correct site;Correct procedure  Maximum sterile barrier precautions: Hand hygiene;Mask;Sterile gloves;Sterile gown;Cap;Large sterile sheet  Site Prep: Chlorhexidine;Skin Prep Completely Dr...  Site Condition No complications  Blue Lumen Status Heparin locked  Red Lumen Status Heparin locked  Purple Lumen Status N/A  Catheter fill solution Heparin 1000 units/ml  Catheter fill volume (Arterial) 1.5 cc  Catheter fill volume (Venous) 1.5  Dressing Type Gauze/Drain sponge  Dressing Status Clean;Dry;Intact  Drainage Description None  Post treatment catheter status Capped and Clamped

## 2018-03-11 NOTE — Progress Notes (Addendum)
[facility] Progress Note  3 Days Post-Op  Subjective: Patient resting comfortably in bed. Notes some abdominal soreness. No complaints of nausea or emesis. Had been tolerating a regular diet. He is NPO today given his dialysis catheter placement procedure today with Dr. Lucky Cowboy. +Flatus, small bowel movement yesterday.   Objective: Vital signs in last 24 hours: Temp:  [98.6 F (37 C)-99.3 F (37.4 C)] 98.6 F (37 C) (09/16 0729) Pulse Rate:  [88-100] 89 (09/16 0729) Resp:  [18-20] 18 (09/16 0729) BP: (104-126)/(76-82) 126/80 (09/16 0729) SpO2:  [93 %-96 %] 93 % (09/16 0729)    Intake/Output from previous day: 09/15 0701 - 09/16 0700 In: 1618.7 [P.O.:460; I.V.:1108.7; IV Piggyback:50] Out: 550 [Urine:550] Intake/Output this shift: No intake/output data recorded.  PE: Gen:  Alert, NAD, pleasant Card:  Regular rate and rhythm, pedal pulses 2+ BL Pulm:  Normal effort, clear to auscultation bilaterally Abd: Soft, minimal tenderness lower abdomen, non-distended, peritoneal dialysis catheter LUQ, laparoscopic incision sites are CDI without surrounding erythema Skin: warm and dry, no rashes  Psych: A&Ox3   Lab Results:  Recent Labs    03/09/18 0427  WBC 12.1*  HGB 10.2*  HCT 30.6*  PLT 255   BMET Recent Labs    03/09/18 0427 03/10/18 1306  NA 136 137  K 5.7* 3.8  CL 109 101  CO2 21* 27  GLUCOSE 212* 100*  BUN 81* 63*  CREATININE 7.32* 7.04*  CALCIUM 8.1* 8.0*   PT/INR No results for input(s): LABPROT, INR in the last 72 hours. CMP     Component Value Date/Time   NA 137 03/10/2018 1306   NA 141 04/06/2014 0754   K 3.8 03/10/2018 1306   K 5.2 (H) 04/06/2014 0754   CL 101 03/10/2018 1306   CL 111 (H) 04/06/2014 0754   CO2 27 03/10/2018 1306   CO2 24 04/06/2014 0754   GLUCOSE 100 (H) 03/10/2018 1306   GLUCOSE 104 (H) 04/06/2014 0754   BUN 63 (H) 03/10/2018 1306   BUN 39 (H) 04/06/2014 0754   CREATININE 7.04 (H) 03/10/2018 1306   CREATININE  4.70 (H) 12/22/2015 0832   CALCIUM 8.0 (L) 03/10/2018 1306   CALCIUM 8.4 (L) 04/06/2014 0754   PROT 6.0 (L) 03/08/2018 0523   ALBUMIN 2.5 (L) 03/10/2018 1306   AST 27 03/08/2018 0523   ALT 29 03/08/2018 0523   ALKPHOS 176 (H) 03/08/2018 0523   BILITOT 0.7 03/08/2018 0523   GFRNONAA 8 (L) 03/10/2018 1306   GFRNONAA 13 (L) 12/22/2015 0832   GFRAA 9 (L) 03/10/2018 1306   GFRAA 15 (L) 12/22/2015 0832   Lipase     Component Value Date/Time   LIPASE 44 09/23/2017 1842       Studies/Results: No results found.  Anti-infectives: Anti-infectives (From admission, onward)   Start     Dose/Rate Route Frequency Ordered Stop   03/07/18 2200  piperacillin-tazobactam (ZOSYN) IVPB 3.375 g     3.375 g 12.5 mL/hr over 240 Minutes Intravenous Every 12 hours 03/07/18 2100     03/07/18 1945  piperacillin-tazobactam (ZOSYN) IVPB 2.25 g  Status:  Discontinued     2.25 g 100 mL/hr over 30 Minutes Intravenous  Once 03/07/18 1932 03/07/18 2100       Assessment/Plan  Acute Cholecystitis complicated by ESRD - Managing well POD3 with minimal abdominal pain, tolerating diet, mobilizing, and passing flatus.  - Will make NPO with plans for Permacath placement by  Dr Lucky Cowboy at 1510 today, followed by HD.  -  Nephrology following for ESRD, we appreciate their input - Continue IVF, IV ABx, Pain Control, Anti-emetics PRN - Mobilize, DVT Prophylaxis - Likely home in next 24-48 hours    LOS: 4 days    Edison Simon , PA-C [facility] 03/11/2018, 8:52 AM [phone] M-F: 7am - 4pm

## 2018-03-11 NOTE — Progress Notes (Signed)
Post HD assessment    03/11/18 1802  Neurological  Level of Consciousness Alert  Orientation Level Oriented X4  Respiratory  Respiratory Pattern Regular;Unlabored  Chest Assessment Chest expansion symmetrical  Cardiac  Pulse Irregular  Jugular Venous Distention (JVD) Yes  Vascular  R Radial Pulse +2  L Radial Pulse +2  Edema Generalized  Integumentary  Integumentary (WDL) X  Skin Color Appropriate for ethnicity  Musculoskeletal  Musculoskeletal (WDL) X  Generalized Weakness Yes  Assistive Device None  GU Assessment  Genitourinary (WDL) X  Genitourinary Symptoms  (HD)  Psychosocial  Psychosocial (WDL) WDL

## 2018-03-11 NOTE — Care Management (Signed)
Patient has a chair time for hemodialysis at [facility] [place]. M-W-F 4:30 PM

## 2018-03-12 LAB — CBC
HEMATOCRIT: 28.3 % — AB (ref 40.0–52.0)
Hemoglobin: 9.5 g/dL — ABNORMAL LOW (ref 13.0–18.0)
MCH: 30.6 pg (ref 26.0–34.0)
MCHC: 33.5 g/dL (ref 32.0–36.0)
MCV: 91.4 fL (ref 80.0–100.0)
Platelets: 262 10*3/uL (ref 150–440)
RBC: 3.1 MIL/uL — AB (ref 4.40–5.90)
RDW: 16 % — ABNORMAL HIGH (ref 11.5–14.5)
WBC: 10.6 10*3/uL (ref 3.8–10.6)

## 2018-03-12 LAB — CULTURE, BLOOD (ROUTINE X 2)
Culture: NO GROWTH
Culture: NO GROWTH
SPECIAL REQUESTS: ADEQUATE

## 2018-03-12 MED ORDER — FAMOTIDINE 20 MG PO TABS: 20.0000 mg | ORAL_TABLET | ORAL | Status: DC

## 2018-03-12 MED ORDER — HYDROMORPHONE HCL 2 MG PO TABS: 2.0000 mg | ORAL_TABLET | Freq: Four times a day (QID) | ORAL | 0 refills | Status: DC | PRN

## 2018-03-12 NOTE — Care Management Important Message (Signed)
Copy of signed IM left with wife, Claudine.

## 2018-03-12 NOTE — Progress Notes (Signed)
PHARMACIST - PHYSICIAN COMMUNICATION  CONCERNING: IV to Oral Route Change Policy  RECOMMENDATION: This patient is receiving famotidine by the intravenous route.  Based on criteria approved by the Pharmacy and Therapeutics Committee, the intravenous medication(s) is/are being converted to the equivalent oral dose form(s).   DESCRIPTION: These criteria include:  The patient is eating (either orally or via tube) and/or has been taking other orally administered medications for a least 24 hours  The patient has no evidence of active gastrointestinal bleeding or impaired GI absorption (gastrectomy, short bowel, patient on TNA or NPO).  If you have questions about this conversion, please contact the Pharmacy Department  []   [phone] )  [facility] [x]   [phone] )  [facility] []   [phone] )  [facility] []   [phone] )  [facility] []   [phone] )  [place] 03/12/2018 7:48 AM

## 2018-03-12 NOTE — Discharge Summary (Addendum)
Discharge Summary  Patient ID: UEL DAVIDOW MRN: [id number] DOB/AGE: 54-Aug-1967 54 y.o.  Admit date: 03/07/2018 Discharge date: 03/12/2018  Discharge Diagnoses Cholecystitis Ventral hernia ESRD on HD  Consultants Nephrology Vascular   Procedures Laparoscopic cholecystectomy with ventral hernia repair on 03/08/18 with Dr. Dahlia Byes US Guided placement of HD catheter to R IJ with Dr. Leotis Pain, MD on 03/11/18  HPI: Marguerite Jarboe Plonskiis a 52 y.o.malethe history of amyloidosis and end-stage renal disease on peritoneal dialysis. He comes in on 03/08/18 for symptomatic ventral hernia that was reduced but the patient also complains of 4-day history of abdominal pain nausea and vomiting. Some decreased appetite. No evidence of biliary obstruction no evidence of cholangitis. No fevers no chills. He reports that he is got intermittent pain in the right upper quadrant worsening with certain meals. There is no specific alleviating factors. He was on a transplant list of multiple centers in both her [place] and [place] and have been advised for cholecystectomy due to gallstones. Work-up in the ER including a CT scan and ultrasound that I have personally reviewed show evidence of cholecystitis with gallstones. Normal common bile duct. White count is mildly elevated and LFTs are normal other than mild elevation of the alkaline phosphatase. HeIs able to perform more than 4 METS of activity without any shortness of breath or chest pain.   He was admitted to general surgery.    Hospital Course: On the day of admission he underwent laparoscopic cholecystectomy with ventral hernia repair with Dr. Caroleen Hamman, MD. He also had temporary HD access established in the right femoral as he had previously been doing peritoneal dialysis. On 09/14, nephrology began following and he started receiving HD. On 09/16, a Permacath was placed in the right IJ by Dr Lucky Cowboy, MD and he again underwent dialysis. He has established  outpatient dialysis follow up at [facility] MWF. On the day of discharge, he was tolerating a diet, mobilizing well, and his pain was well controlled. He understands he is to follow up with general surgery in 2 weeks. All of his questions and concerns were addressed and answered.    Physical Exam:  Const; well appearing male, NAD Neck: Permacath in right IJ, dressing in place Card: HRRR, No m/r/g Resp: CTAB, no wheezes, no rales, no rhonchi Abdomen: Soft, Non-tender, non-distend, laparoscopic incision sites are CDI without erythema, peritoneal dialysis catheter in place.  MSK: No peripheral edema Skin: warm, dry Nuero: A&O x3    Allergies as of 03/12/2018      Reactions   Ciprofloxacin Hcl Swelling   High fever   Doxycycline Hyclate Swelling   Phenol-glycerin Swelling      Medication List    TAKE these medications   albuterol 108 (90 Base) MCG/ACT inhaler Commonly known as:  PROVENTIL HFA;VENTOLIN HFA Inhale 2 puffs into the lungs every 6 (six) hours as needed for wheezing or shortness of breath.   ARTIFICIAL TEARS OP Apply 1 drop to eye as needed.   atorvastatin 20 MG tablet Commonly known as:  LIPITOR TAKE 1 TABLET BY MOUTH ONCE DAILY   cycloSPORINE 0.05 % ophthalmic emulsion Commonly known as:  RESTASIS Place 1 drop into both eyes daily.   EPOGEN IJ Inject as directed every 30 (thirty) days. Administered at Dialysis center.   EYLEA 2 MG/0.05ML Soln Generic drug:  Aflibercept 1 Dose by Intravitreal route every 30 (thirty) days.   ezetimibe 10 MG tablet Commonly known as:  ZETIA Take 1 tablet (10 mg total) by mouth daily.  folic acid 1 MG tablet Commonly known as:  FOLVITE Take 5 mg by mouth daily.   gentamicin cream 0.1 % Commonly known as:  GARAMYCIN Apply 1 application topically at bedtime.   HYDROmorphone 2 MG tablet Commonly known as:  DILAUDID Take 1 tablet (2 mg total) by mouth every 6 (six) hours as needed for severe pain.   losartan 100 MG  tablet Commonly known as:  COZAAR Take 1 tablet (100 mg total) by mouth daily. What changed:  how much to take   ondansetron 4 MG disintegrating tablet Commonly known as:  ZOFRAN-ODT Take 1 tablet (4 mg total) by mouth every 8 (eight) hours as needed for nausea or vomiting.   OXYCONTIN 10 mg 12 hr tablet Generic drug:  oxyCODONE Take 5-10 mg by mouth every 12 (twelve) hours.   pantoprazole 40 MG tablet Commonly known as:  PROTONIX Take 1 tablet (40 mg total) by mouth daily.   polyethylene glycol packet Commonly known as:  MIRALAX / GLYCOLAX Take 17 g by mouth daily. What changed:    when to take this  reasons to take this   VELTASSA 8.4 g packet Generic drug:  patiromer Take 1 packet by mouth daily.        Follow-up Information    Pabon, [place] F, MD. Schedule an appointment as soon as possible for a visit in 2 week(s).   Specialty:  General Surgery Why:  s/p lap chole on 03/08/18 with dr. Ulyses Amor information: [address] [place] [address] [phone]           Signed: Edison Simon , PA-C [facility]  03/12/2018, 9:53 AM [phone] M-F: 7am - 4pm

## 2018-03-12 NOTE — Progress Notes (Signed)
Discharged home with family. Discharge reviewed by Lyanne Co RN. IVs removed and Right grown site removed by Brandi.

## 2018-03-13 ENCOUNTER — Encounter: Payer: Self-pay | Admitting: *Deleted

## 2018-03-13 ENCOUNTER — Other Ambulatory Visit: Payer: Self-pay | Admitting: *Deleted

## 2018-03-13 DIAGNOSIS — N186 End stage renal disease: Secondary | ICD-10-CM | POA: Diagnosis not present

## 2018-03-13 DIAGNOSIS — N2581 Secondary hyperparathyroidism of renal origin: Secondary | ICD-10-CM | POA: Diagnosis not present

## 2018-03-13 DIAGNOSIS — D631 Anemia in chronic kidney disease: Secondary | ICD-10-CM | POA: Diagnosis not present

## 2018-03-13 DIAGNOSIS — Z992 Dependence on renal dialysis: Secondary | ICD-10-CM | POA: Diagnosis not present

## 2018-03-13 DIAGNOSIS — D509 Iron deficiency anemia, unspecified: Secondary | ICD-10-CM | POA: Diagnosis not present

## 2018-03-13 LAB — SURGICAL PATHOLOGY

## 2018-03-13 NOTE — Patient Outreach (Signed)
[place]) Care Management  03/13/2018  Joseph [name] [facility] 1966/04/18 [id number]   Subjective: Telephone call to patient's home number, spoke with patient, and HIPAA verified.  Discussed [facility] Care Management UMR Transition of care follow up, patient voiced understanding, and is in agreement to follow up.  Patient states Medicare is now primary and UMR is secondary.   Patient states he is doing well, recovering from recent surgery, has some soreness, pain being managed with medications, and has a follow up appointment with surgeon on 03/25/18.  Patient states he is able to manage self care and has assistance as needed with activities of daily living / home management.  Patient voices understanding of medical diagnosis, surgery,  and treatment plan.  States he is accessing the following [facility] benefits: outpatient pharmacy, hospital indemnity (not sure, will ask his wife to verify, will file claim if appropriate),  wife has used family medical leave act (FMLA) in the place, and is aware of how to access if needed. Patient states he does not have any education material, transition of care, care coordination, disease management, disease monitoring, transportation, community resource, or pharmacy needs at this time.  States he is very appreciative of the follow up and is in agreement to receive [place] Management information.      Objective: Per KPN (Knowledge Performance Now, point of care tool) and chart review,patient hospitalized 03/07/18 -03/12/18 for cholecystitis, status post Laparoscopic  cholecystectomy with repair of ventral hernia on 03/08/18.  Patient hospitalized 09/23/17 -09/25/17 for Adrenal hemorrhage and chest pain.  Patient hospitalized 08/06/17 -08/09/17 forChest pain likely referred pain from his right adrenal gland hemorrhage.Patient hospitalized 01/29/17 - 01/31/17 for sepsis due to right lower lobe pneumonia.Patient also has a history ofEnd-stage renal disease-secondary to  amyloidosis, on peritoneal dialysis,dry eyes, hypertension, hyperlipidemia, and anemia.[facility] Care Management completed transition of care follow up call on 09/26/17.        Assessment: Received UMR Transition of care referral on 03/08/18.Transition of care follow up completed, no care management needs, and will proceed with case closure.        Plan: RNCM will send patient successful outreach letter, [facility] pamphlet, and magnet. RNCM will complete case closure due to follow up completed / no care management needs.        Beula Joyner H. Annia Friendly, BSN, [place] Management [facility] Telephonic CM Phone: [phone] Fax: [phone]

## 2018-03-15 DIAGNOSIS — D631 Anemia in chronic kidney disease: Secondary | ICD-10-CM | POA: Diagnosis not present

## 2018-03-15 DIAGNOSIS — H35713 Central serous chorioretinopathy, bilateral: Secondary | ICD-10-CM | POA: Diagnosis not present

## 2018-03-15 DIAGNOSIS — N2581 Secondary hyperparathyroidism of renal origin: Secondary | ICD-10-CM | POA: Diagnosis not present

## 2018-03-15 DIAGNOSIS — H3581 Retinal edema: Secondary | ICD-10-CM | POA: Diagnosis not present

## 2018-03-15 DIAGNOSIS — Z79899 Other long term (current) drug therapy: Secondary | ICD-10-CM | POA: Diagnosis not present

## 2018-03-15 DIAGNOSIS — H353211 Exudative age-related macular degeneration, right eye, with active choroidal neovascularization: Secondary | ICD-10-CM | POA: Diagnosis not present

## 2018-03-15 DIAGNOSIS — H353231 Exudative age-related macular degeneration, bilateral, with active choroidal neovascularization: Secondary | ICD-10-CM | POA: Diagnosis not present

## 2018-03-15 DIAGNOSIS — N186 End stage renal disease: Secondary | ICD-10-CM | POA: Diagnosis not present

## 2018-03-15 DIAGNOSIS — D509 Iron deficiency anemia, unspecified: Secondary | ICD-10-CM | POA: Diagnosis not present

## 2018-03-15 DIAGNOSIS — Z992 Dependence on renal dialysis: Secondary | ICD-10-CM | POA: Diagnosis not present

## 2018-03-18 DIAGNOSIS — N2581 Secondary hyperparathyroidism of renal origin: Secondary | ICD-10-CM | POA: Diagnosis not present

## 2018-03-18 DIAGNOSIS — D509 Iron deficiency anemia, unspecified: Secondary | ICD-10-CM | POA: Diagnosis not present

## 2018-03-18 DIAGNOSIS — Z992 Dependence on renal dialysis: Secondary | ICD-10-CM | POA: Diagnosis not present

## 2018-03-18 DIAGNOSIS — N186 End stage renal disease: Secondary | ICD-10-CM | POA: Diagnosis not present

## 2018-03-18 DIAGNOSIS — D631 Anemia in chronic kidney disease: Secondary | ICD-10-CM | POA: Diagnosis not present

## 2018-03-20 DIAGNOSIS — D631 Anemia in chronic kidney disease: Secondary | ICD-10-CM | POA: Diagnosis not present

## 2018-03-20 DIAGNOSIS — N186 End stage renal disease: Secondary | ICD-10-CM | POA: Diagnosis not present

## 2018-03-20 DIAGNOSIS — D509 Iron deficiency anemia, unspecified: Secondary | ICD-10-CM | POA: Diagnosis not present

## 2018-03-20 DIAGNOSIS — N2581 Secondary hyperparathyroidism of renal origin: Secondary | ICD-10-CM | POA: Diagnosis not present

## 2018-03-20 DIAGNOSIS — Z992 Dependence on renal dialysis: Secondary | ICD-10-CM | POA: Diagnosis not present

## 2018-03-22 DIAGNOSIS — N2581 Secondary hyperparathyroidism of renal origin: Secondary | ICD-10-CM | POA: Diagnosis not present

## 2018-03-22 DIAGNOSIS — Z992 Dependence on renal dialysis: Secondary | ICD-10-CM | POA: Diagnosis not present

## 2018-03-22 DIAGNOSIS — N186 End stage renal disease: Secondary | ICD-10-CM | POA: Diagnosis not present

## 2018-03-22 DIAGNOSIS — D631 Anemia in chronic kidney disease: Secondary | ICD-10-CM | POA: Diagnosis not present

## 2018-03-22 DIAGNOSIS — D509 Iron deficiency anemia, unspecified: Secondary | ICD-10-CM | POA: Diagnosis not present

## 2018-03-25 ENCOUNTER — Ambulatory Visit (INDEPENDENT_AMBULATORY_CARE_PROVIDER_SITE_OTHER): Payer: Medicare Other | Admitting: Surgery

## 2018-03-25 ENCOUNTER — Encounter: Payer: Self-pay | Admitting: Surgery

## 2018-03-25 VITALS — BP 116/82 | HR 82 | Temp 97.3°F | Ht 71.0 in | Wt 181.0 lb

## 2018-03-25 DIAGNOSIS — N186 End stage renal disease: Secondary | ICD-10-CM | POA: Diagnosis not present

## 2018-03-25 DIAGNOSIS — Z09 Encounter for follow-up examination after completed treatment for conditions other than malignant neoplasm: Secondary | ICD-10-CM

## 2018-03-25 DIAGNOSIS — Z992 Dependence on renal dialysis: Secondary | ICD-10-CM | POA: Diagnosis not present

## 2018-03-25 NOTE — Patient Instructions (Signed)

## 2018-03-25 NOTE — Progress Notes (Signed)
S/p lap chole Path d/w pt Doping well No complaints. Taking Po  PE: NAD Abd: soft, incisions c/d/i, no infection PD catheter in place w a clot. No peritonitis  A/P Doing well, no surgical issues May resume PD from my standpoint RTC prn

## 2018-03-26 DIAGNOSIS — N2581 Secondary hyperparathyroidism of renal origin: Secondary | ICD-10-CM | POA: Diagnosis not present

## 2018-03-26 DIAGNOSIS — N186 End stage renal disease: Secondary | ICD-10-CM | POA: Diagnosis not present

## 2018-03-26 DIAGNOSIS — Z992 Dependence on renal dialysis: Secondary | ICD-10-CM | POA: Diagnosis not present

## 2018-03-26 DIAGNOSIS — H35713 Central serous chorioretinopathy, bilateral: Secondary | ICD-10-CM | POA: Diagnosis not present

## 2018-03-26 DIAGNOSIS — H353231 Exudative age-related macular degeneration, bilateral, with active choroidal neovascularization: Secondary | ICD-10-CM | POA: Diagnosis not present

## 2018-03-26 DIAGNOSIS — D509 Iron deficiency anemia, unspecified: Secondary | ICD-10-CM | POA: Diagnosis not present

## 2018-03-26 DIAGNOSIS — D631 Anemia in chronic kidney disease: Secondary | ICD-10-CM | POA: Diagnosis not present

## 2018-03-27 DIAGNOSIS — Z992 Dependence on renal dialysis: Secondary | ICD-10-CM | POA: Diagnosis not present

## 2018-03-27 DIAGNOSIS — N2581 Secondary hyperparathyroidism of renal origin: Secondary | ICD-10-CM | POA: Diagnosis not present

## 2018-03-27 DIAGNOSIS — D631 Anemia in chronic kidney disease: Secondary | ICD-10-CM | POA: Diagnosis not present

## 2018-03-27 DIAGNOSIS — D509 Iron deficiency anemia, unspecified: Secondary | ICD-10-CM | POA: Diagnosis not present

## 2018-03-27 DIAGNOSIS — N186 End stage renal disease: Secondary | ICD-10-CM | POA: Diagnosis not present

## 2018-03-28 DIAGNOSIS — D631 Anemia in chronic kidney disease: Secondary | ICD-10-CM | POA: Diagnosis not present

## 2018-03-28 DIAGNOSIS — N2581 Secondary hyperparathyroidism of renal origin: Secondary | ICD-10-CM | POA: Diagnosis not present

## 2018-03-28 DIAGNOSIS — D509 Iron deficiency anemia, unspecified: Secondary | ICD-10-CM | POA: Diagnosis not present

## 2018-03-28 DIAGNOSIS — Z992 Dependence on renal dialysis: Secondary | ICD-10-CM | POA: Diagnosis not present

## 2018-03-28 DIAGNOSIS — N186 End stage renal disease: Secondary | ICD-10-CM | POA: Diagnosis not present

## 2018-03-29 DIAGNOSIS — D631 Anemia in chronic kidney disease: Secondary | ICD-10-CM | POA: Diagnosis not present

## 2018-03-29 DIAGNOSIS — N186 End stage renal disease: Secondary | ICD-10-CM | POA: Diagnosis not present

## 2018-03-29 DIAGNOSIS — D509 Iron deficiency anemia, unspecified: Secondary | ICD-10-CM | POA: Diagnosis not present

## 2018-03-29 DIAGNOSIS — Z992 Dependence on renal dialysis: Secondary | ICD-10-CM | POA: Diagnosis not present

## 2018-03-29 DIAGNOSIS — N2581 Secondary hyperparathyroidism of renal origin: Secondary | ICD-10-CM | POA: Diagnosis not present

## 2018-03-30 DIAGNOSIS — D509 Iron deficiency anemia, unspecified: Secondary | ICD-10-CM | POA: Diagnosis not present

## 2018-03-30 DIAGNOSIS — N186 End stage renal disease: Secondary | ICD-10-CM | POA: Diagnosis not present

## 2018-03-30 DIAGNOSIS — Z992 Dependence on renal dialysis: Secondary | ICD-10-CM | POA: Diagnosis not present

## 2018-03-30 DIAGNOSIS — D631 Anemia in chronic kidney disease: Secondary | ICD-10-CM | POA: Diagnosis not present

## 2018-03-30 DIAGNOSIS — N2581 Secondary hyperparathyroidism of renal origin: Secondary | ICD-10-CM | POA: Diagnosis not present

## 2018-03-31 DIAGNOSIS — D631 Anemia in chronic kidney disease: Secondary | ICD-10-CM | POA: Diagnosis not present

## 2018-03-31 DIAGNOSIS — N186 End stage renal disease: Secondary | ICD-10-CM | POA: Diagnosis not present

## 2018-03-31 DIAGNOSIS — D509 Iron deficiency anemia, unspecified: Secondary | ICD-10-CM | POA: Diagnosis not present

## 2018-03-31 DIAGNOSIS — Z992 Dependence on renal dialysis: Secondary | ICD-10-CM | POA: Diagnosis not present

## 2018-03-31 DIAGNOSIS — N2581 Secondary hyperparathyroidism of renal origin: Secondary | ICD-10-CM | POA: Diagnosis not present

## 2018-04-01 DIAGNOSIS — Z992 Dependence on renal dialysis: Secondary | ICD-10-CM | POA: Diagnosis not present

## 2018-04-01 DIAGNOSIS — N2581 Secondary hyperparathyroidism of renal origin: Secondary | ICD-10-CM | POA: Diagnosis not present

## 2018-04-01 DIAGNOSIS — D509 Iron deficiency anemia, unspecified: Secondary | ICD-10-CM | POA: Diagnosis not present

## 2018-04-01 DIAGNOSIS — D631 Anemia in chronic kidney disease: Secondary | ICD-10-CM | POA: Diagnosis not present

## 2018-04-01 DIAGNOSIS — N186 End stage renal disease: Secondary | ICD-10-CM | POA: Diagnosis not present

## 2018-04-02 DIAGNOSIS — D509 Iron deficiency anemia, unspecified: Secondary | ICD-10-CM | POA: Diagnosis not present

## 2018-04-02 DIAGNOSIS — D631 Anemia in chronic kidney disease: Secondary | ICD-10-CM | POA: Diagnosis not present

## 2018-04-02 DIAGNOSIS — N2581 Secondary hyperparathyroidism of renal origin: Secondary | ICD-10-CM | POA: Diagnosis not present

## 2018-04-02 DIAGNOSIS — Z992 Dependence on renal dialysis: Secondary | ICD-10-CM | POA: Diagnosis not present

## 2018-04-02 DIAGNOSIS — N186 End stage renal disease: Secondary | ICD-10-CM | POA: Diagnosis not present

## 2018-04-02 DIAGNOSIS — E7800 Pure hypercholesterolemia, unspecified: Secondary | ICD-10-CM | POA: Diagnosis not present

## 2018-04-03 ENCOUNTER — Ambulatory Visit (INDEPENDENT_AMBULATORY_CARE_PROVIDER_SITE_OTHER): Payer: Medicare Other | Admitting: Family Medicine

## 2018-04-03 DIAGNOSIS — D509 Iron deficiency anemia, unspecified: Secondary | ICD-10-CM | POA: Diagnosis not present

## 2018-04-03 DIAGNOSIS — N186 End stage renal disease: Secondary | ICD-10-CM | POA: Diagnosis not present

## 2018-04-03 DIAGNOSIS — N2581 Secondary hyperparathyroidism of renal origin: Secondary | ICD-10-CM | POA: Diagnosis not present

## 2018-04-03 DIAGNOSIS — Z992 Dependence on renal dialysis: Secondary | ICD-10-CM | POA: Diagnosis not present

## 2018-04-03 DIAGNOSIS — Z23 Encounter for immunization: Secondary | ICD-10-CM | POA: Diagnosis not present

## 2018-04-03 DIAGNOSIS — D631 Anemia in chronic kidney disease: Secondary | ICD-10-CM | POA: Diagnosis not present

## 2018-04-04 ENCOUNTER — Encounter (INDEPENDENT_AMBULATORY_CARE_PROVIDER_SITE_OTHER): Payer: Self-pay

## 2018-04-04 DIAGNOSIS — D509 Iron deficiency anemia, unspecified: Secondary | ICD-10-CM | POA: Diagnosis not present

## 2018-04-04 DIAGNOSIS — N186 End stage renal disease: Secondary | ICD-10-CM | POA: Diagnosis not present

## 2018-04-04 DIAGNOSIS — D631 Anemia in chronic kidney disease: Secondary | ICD-10-CM | POA: Diagnosis not present

## 2018-04-04 DIAGNOSIS — Z992 Dependence on renal dialysis: Secondary | ICD-10-CM | POA: Diagnosis not present

## 2018-04-04 DIAGNOSIS — N2581 Secondary hyperparathyroidism of renal origin: Secondary | ICD-10-CM | POA: Diagnosis not present

## 2018-04-05 DIAGNOSIS — Z992 Dependence on renal dialysis: Secondary | ICD-10-CM | POA: Diagnosis not present

## 2018-04-05 DIAGNOSIS — D509 Iron deficiency anemia, unspecified: Secondary | ICD-10-CM | POA: Diagnosis not present

## 2018-04-05 DIAGNOSIS — N2581 Secondary hyperparathyroidism of renal origin: Secondary | ICD-10-CM | POA: Diagnosis not present

## 2018-04-05 DIAGNOSIS — D631 Anemia in chronic kidney disease: Secondary | ICD-10-CM | POA: Diagnosis not present

## 2018-04-05 DIAGNOSIS — N186 End stage renal disease: Secondary | ICD-10-CM | POA: Diagnosis not present

## 2018-04-06 DIAGNOSIS — D509 Iron deficiency anemia, unspecified: Secondary | ICD-10-CM | POA: Diagnosis not present

## 2018-04-06 DIAGNOSIS — Z992 Dependence on renal dialysis: Secondary | ICD-10-CM | POA: Diagnosis not present

## 2018-04-06 DIAGNOSIS — D631 Anemia in chronic kidney disease: Secondary | ICD-10-CM | POA: Diagnosis not present

## 2018-04-06 DIAGNOSIS — N186 End stage renal disease: Secondary | ICD-10-CM | POA: Diagnosis not present

## 2018-04-06 DIAGNOSIS — N2581 Secondary hyperparathyroidism of renal origin: Secondary | ICD-10-CM | POA: Diagnosis not present

## 2018-04-07 DIAGNOSIS — Z992 Dependence on renal dialysis: Secondary | ICD-10-CM | POA: Diagnosis not present

## 2018-04-07 DIAGNOSIS — N186 End stage renal disease: Secondary | ICD-10-CM | POA: Diagnosis not present

## 2018-04-07 DIAGNOSIS — D509 Iron deficiency anemia, unspecified: Secondary | ICD-10-CM | POA: Diagnosis not present

## 2018-04-07 DIAGNOSIS — N2581 Secondary hyperparathyroidism of renal origin: Secondary | ICD-10-CM | POA: Diagnosis not present

## 2018-04-07 DIAGNOSIS — D631 Anemia in chronic kidney disease: Secondary | ICD-10-CM | POA: Diagnosis not present

## 2018-04-08 DIAGNOSIS — D631 Anemia in chronic kidney disease: Secondary | ICD-10-CM | POA: Diagnosis not present

## 2018-04-08 DIAGNOSIS — N186 End stage renal disease: Secondary | ICD-10-CM | POA: Diagnosis not present

## 2018-04-08 DIAGNOSIS — D509 Iron deficiency anemia, unspecified: Secondary | ICD-10-CM | POA: Diagnosis not present

## 2018-04-08 DIAGNOSIS — N2581 Secondary hyperparathyroidism of renal origin: Secondary | ICD-10-CM | POA: Diagnosis not present

## 2018-04-08 DIAGNOSIS — Z992 Dependence on renal dialysis: Secondary | ICD-10-CM | POA: Diagnosis not present

## 2018-04-09 ENCOUNTER — Other Ambulatory Visit (INDEPENDENT_AMBULATORY_CARE_PROVIDER_SITE_OTHER): Payer: Self-pay | Admitting: Vascular Surgery

## 2018-04-09 DIAGNOSIS — Z992 Dependence on renal dialysis: Secondary | ICD-10-CM | POA: Diagnosis not present

## 2018-04-09 DIAGNOSIS — D509 Iron deficiency anemia, unspecified: Secondary | ICD-10-CM | POA: Diagnosis not present

## 2018-04-09 DIAGNOSIS — N2581 Secondary hyperparathyroidism of renal origin: Secondary | ICD-10-CM | POA: Diagnosis not present

## 2018-04-09 DIAGNOSIS — N186 End stage renal disease: Secondary | ICD-10-CM | POA: Diagnosis not present

## 2018-04-09 DIAGNOSIS — D631 Anemia in chronic kidney disease: Secondary | ICD-10-CM | POA: Diagnosis not present

## 2018-04-10 DIAGNOSIS — N186 End stage renal disease: Secondary | ICD-10-CM | POA: Diagnosis not present

## 2018-04-10 DIAGNOSIS — Z992 Dependence on renal dialysis: Secondary | ICD-10-CM | POA: Diagnosis not present

## 2018-04-10 DIAGNOSIS — D509 Iron deficiency anemia, unspecified: Secondary | ICD-10-CM | POA: Diagnosis not present

## 2018-04-10 DIAGNOSIS — D631 Anemia in chronic kidney disease: Secondary | ICD-10-CM | POA: Diagnosis not present

## 2018-04-10 DIAGNOSIS — N2581 Secondary hyperparathyroidism of renal origin: Secondary | ICD-10-CM | POA: Diagnosis not present

## 2018-04-11 DIAGNOSIS — Z992 Dependence on renal dialysis: Secondary | ICD-10-CM | POA: Diagnosis not present

## 2018-04-11 DIAGNOSIS — D631 Anemia in chronic kidney disease: Secondary | ICD-10-CM | POA: Diagnosis not present

## 2018-04-11 DIAGNOSIS — N186 End stage renal disease: Secondary | ICD-10-CM | POA: Diagnosis not present

## 2018-04-11 DIAGNOSIS — N2581 Secondary hyperparathyroidism of renal origin: Secondary | ICD-10-CM | POA: Diagnosis not present

## 2018-04-11 DIAGNOSIS — D509 Iron deficiency anemia, unspecified: Secondary | ICD-10-CM | POA: Diagnosis not present

## 2018-04-12 DIAGNOSIS — N2581 Secondary hyperparathyroidism of renal origin: Secondary | ICD-10-CM | POA: Diagnosis not present

## 2018-04-12 DIAGNOSIS — N186 End stage renal disease: Secondary | ICD-10-CM | POA: Diagnosis not present

## 2018-04-12 DIAGNOSIS — D631 Anemia in chronic kidney disease: Secondary | ICD-10-CM | POA: Diagnosis not present

## 2018-04-12 DIAGNOSIS — D509 Iron deficiency anemia, unspecified: Secondary | ICD-10-CM | POA: Diagnosis not present

## 2018-04-12 DIAGNOSIS — Z7682 Awaiting organ transplant status: Secondary | ICD-10-CM | POA: Diagnosis not present

## 2018-04-12 DIAGNOSIS — Z992 Dependence on renal dialysis: Secondary | ICD-10-CM | POA: Diagnosis not present

## 2018-04-13 DIAGNOSIS — N2581 Secondary hyperparathyroidism of renal origin: Secondary | ICD-10-CM | POA: Diagnosis not present

## 2018-04-13 DIAGNOSIS — D631 Anemia in chronic kidney disease: Secondary | ICD-10-CM | POA: Diagnosis not present

## 2018-04-13 DIAGNOSIS — N186 End stage renal disease: Secondary | ICD-10-CM | POA: Diagnosis not present

## 2018-04-13 DIAGNOSIS — D509 Iron deficiency anemia, unspecified: Secondary | ICD-10-CM | POA: Diagnosis not present

## 2018-04-13 DIAGNOSIS — Z992 Dependence on renal dialysis: Secondary | ICD-10-CM | POA: Diagnosis not present

## 2018-04-14 DIAGNOSIS — D509 Iron deficiency anemia, unspecified: Secondary | ICD-10-CM | POA: Diagnosis not present

## 2018-04-14 DIAGNOSIS — N2581 Secondary hyperparathyroidism of renal origin: Secondary | ICD-10-CM | POA: Diagnosis not present

## 2018-04-14 DIAGNOSIS — N186 End stage renal disease: Secondary | ICD-10-CM | POA: Diagnosis not present

## 2018-04-14 DIAGNOSIS — D631 Anemia in chronic kidney disease: Secondary | ICD-10-CM | POA: Diagnosis not present

## 2018-04-14 DIAGNOSIS — Z992 Dependence on renal dialysis: Secondary | ICD-10-CM | POA: Diagnosis not present

## 2018-04-15 DIAGNOSIS — D631 Anemia in chronic kidney disease: Secondary | ICD-10-CM | POA: Diagnosis not present

## 2018-04-15 DIAGNOSIS — N186 End stage renal disease: Secondary | ICD-10-CM | POA: Diagnosis not present

## 2018-04-15 DIAGNOSIS — Z992 Dependence on renal dialysis: Secondary | ICD-10-CM | POA: Diagnosis not present

## 2018-04-15 DIAGNOSIS — N2581 Secondary hyperparathyroidism of renal origin: Secondary | ICD-10-CM | POA: Diagnosis not present

## 2018-04-15 DIAGNOSIS — D509 Iron deficiency anemia, unspecified: Secondary | ICD-10-CM | POA: Diagnosis not present

## 2018-04-16 DIAGNOSIS — N186 End stage renal disease: Secondary | ICD-10-CM | POA: Diagnosis not present

## 2018-04-16 DIAGNOSIS — Z992 Dependence on renal dialysis: Secondary | ICD-10-CM | POA: Diagnosis not present

## 2018-04-16 DIAGNOSIS — D631 Anemia in chronic kidney disease: Secondary | ICD-10-CM | POA: Diagnosis not present

## 2018-04-16 DIAGNOSIS — D509 Iron deficiency anemia, unspecified: Secondary | ICD-10-CM | POA: Diagnosis not present

## 2018-04-16 DIAGNOSIS — N2581 Secondary hyperparathyroidism of renal origin: Secondary | ICD-10-CM | POA: Diagnosis not present

## 2018-04-17 DIAGNOSIS — Z992 Dependence on renal dialysis: Secondary | ICD-10-CM | POA: Diagnosis not present

## 2018-04-17 DIAGNOSIS — N2581 Secondary hyperparathyroidism of renal origin: Secondary | ICD-10-CM | POA: Diagnosis not present

## 2018-04-17 DIAGNOSIS — D509 Iron deficiency anemia, unspecified: Secondary | ICD-10-CM | POA: Diagnosis not present

## 2018-04-17 DIAGNOSIS — N186 End stage renal disease: Secondary | ICD-10-CM | POA: Diagnosis not present

## 2018-04-17 DIAGNOSIS — D631 Anemia in chronic kidney disease: Secondary | ICD-10-CM | POA: Diagnosis not present

## 2018-04-18 ENCOUNTER — Ambulatory Visit: Admission: RE | Admit: 2018-04-18 | Payer: 59 | Source: Ambulatory Visit | Admitting: Vascular Surgery

## 2018-04-18 ENCOUNTER — Encounter: Admission: RE | Payer: Self-pay | Source: Ambulatory Visit

## 2018-04-18 DIAGNOSIS — J449 Chronic obstructive pulmonary disease, unspecified: Secondary | ICD-10-CM | POA: Diagnosis present

## 2018-04-18 DIAGNOSIS — J9811 Atelectasis: Secondary | ICD-10-CM | POA: Diagnosis not present

## 2018-04-18 DIAGNOSIS — R918 Other nonspecific abnormal finding of lung field: Secondary | ICD-10-CM | POA: Diagnosis not present

## 2018-04-18 DIAGNOSIS — I120 Hypertensive chronic kidney disease with stage 5 chronic kidney disease or end stage renal disease: Secondary | ICD-10-CM | POA: Diagnosis present

## 2018-04-18 DIAGNOSIS — I959 Hypotension, unspecified: Secondary | ICD-10-CM | POA: Diagnosis not present

## 2018-04-18 DIAGNOSIS — Z4902 Encounter for fitting and adjustment of peritoneal dialysis catheter: Secondary | ICD-10-CM | POA: Diagnosis not present

## 2018-04-18 DIAGNOSIS — N186 End stage renal disease: Secondary | ICD-10-CM | POA: Diagnosis present

## 2018-04-18 DIAGNOSIS — B1920 Unspecified viral hepatitis C without hepatic coma: Secondary | ICD-10-CM | POA: Diagnosis not present

## 2018-04-18 DIAGNOSIS — Z940 Kidney transplant status: Secondary | ICD-10-CM | POA: Diagnosis not present

## 2018-04-18 DIAGNOSIS — Z820 Family history of epilepsy and other diseases of the nervous system: Secondary | ICD-10-CM | POA: Diagnosis not present

## 2018-04-18 DIAGNOSIS — Z9189 Other specified personal risk factors, not elsewhere classified: Secondary | ICD-10-CM | POA: Diagnosis not present

## 2018-04-18 DIAGNOSIS — E859 Amyloidosis, unspecified: Secondary | ICD-10-CM | POA: Diagnosis present

## 2018-04-18 DIAGNOSIS — D509 Iron deficiency anemia, unspecified: Secondary | ICD-10-CM | POA: Diagnosis not present

## 2018-04-18 DIAGNOSIS — Z992 Dependence on renal dialysis: Secondary | ICD-10-CM | POA: Diagnosis not present

## 2018-04-18 DIAGNOSIS — Z79899 Other long term (current) drug therapy: Secondary | ICD-10-CM | POA: Diagnosis not present

## 2018-04-18 DIAGNOSIS — N2581 Secondary hyperparathyroidism of renal origin: Secondary | ICD-10-CM | POA: Diagnosis not present

## 2018-04-18 DIAGNOSIS — D631 Anemia in chronic kidney disease: Secondary | ICD-10-CM | POA: Diagnosis not present

## 2018-04-18 DIAGNOSIS — Z8701 Personal history of pneumonia (recurrent): Secondary | ICD-10-CM | POA: Diagnosis not present

## 2018-04-18 DIAGNOSIS — D649 Anemia, unspecified: Secondary | ICD-10-CM | POA: Diagnosis not present

## 2018-04-18 DIAGNOSIS — Z9049 Acquired absence of other specified parts of digestive tract: Secondary | ICD-10-CM | POA: Diagnosis not present

## 2018-04-18 DIAGNOSIS — N170 Acute kidney failure with tubular necrosis: Secondary | ICD-10-CM | POA: Diagnosis not present

## 2018-04-18 DIAGNOSIS — I498 Other specified cardiac arrhythmias: Secondary | ICD-10-CM | POA: Diagnosis not present

## 2018-04-18 DIAGNOSIS — Z01818 Encounter for other preprocedural examination: Secondary | ICD-10-CM | POA: Diagnosis not present

## 2018-04-18 DIAGNOSIS — I10 Essential (primary) hypertension: Secondary | ICD-10-CM | POA: Diagnosis not present

## 2018-04-18 DIAGNOSIS — R0989 Other specified symptoms and signs involving the circulatory and respiratory systems: Secondary | ICD-10-CM | POA: Diagnosis not present

## 2018-04-18 DIAGNOSIS — Z87891 Personal history of nicotine dependence: Secondary | ICD-10-CM | POA: Diagnosis not present

## 2018-04-18 DIAGNOSIS — Z01810 Encounter for preprocedural cardiovascular examination: Secondary | ICD-10-CM | POA: Diagnosis not present

## 2018-04-18 SURGERY — DIALYSIS/PERMA CATHETER REMOVAL
Anesthesia: LOCAL

## 2018-04-19 DIAGNOSIS — Z79899 Other long term (current) drug therapy: Secondary | ICD-10-CM | POA: Diagnosis not present

## 2018-04-19 DIAGNOSIS — I120 Hypertensive chronic kidney disease with stage 5 chronic kidney disease or end stage renal disease: Secondary | ICD-10-CM | POA: Diagnosis not present

## 2018-04-19 DIAGNOSIS — I10 Essential (primary) hypertension: Secondary | ICD-10-CM | POA: Diagnosis not present

## 2018-04-19 DIAGNOSIS — Z940 Kidney transplant status: Secondary | ICD-10-CM | POA: Diagnosis not present

## 2018-04-19 DIAGNOSIS — N170 Acute kidney failure with tubular necrosis: Secondary | ICD-10-CM | POA: Diagnosis not present

## 2018-04-20 DIAGNOSIS — Z79899 Other long term (current) drug therapy: Secondary | ICD-10-CM | POA: Diagnosis not present

## 2018-04-20 DIAGNOSIS — I10 Essential (primary) hypertension: Secondary | ICD-10-CM | POA: Diagnosis not present

## 2018-04-20 DIAGNOSIS — Z940 Kidney transplant status: Secondary | ICD-10-CM | POA: Diagnosis not present

## 2018-04-20 DIAGNOSIS — N170 Acute kidney failure with tubular necrosis: Secondary | ICD-10-CM | POA: Diagnosis not present

## 2018-04-21 DIAGNOSIS — I10 Essential (primary) hypertension: Secondary | ICD-10-CM | POA: Diagnosis not present

## 2018-04-21 DIAGNOSIS — Z940 Kidney transplant status: Secondary | ICD-10-CM | POA: Diagnosis not present

## 2018-04-21 DIAGNOSIS — N170 Acute kidney failure with tubular necrosis: Secondary | ICD-10-CM | POA: Diagnosis not present

## 2018-04-21 DIAGNOSIS — Z79899 Other long term (current) drug therapy: Secondary | ICD-10-CM | POA: Diagnosis not present

## 2018-04-22 DIAGNOSIS — I10 Essential (primary) hypertension: Secondary | ICD-10-CM | POA: Diagnosis not present

## 2018-04-22 DIAGNOSIS — Z79899 Other long term (current) drug therapy: Secondary | ICD-10-CM | POA: Diagnosis not present

## 2018-04-22 DIAGNOSIS — Z940 Kidney transplant status: Secondary | ICD-10-CM | POA: Diagnosis not present

## 2018-04-22 DIAGNOSIS — N186 End stage renal disease: Secondary | ICD-10-CM | POA: Diagnosis not present

## 2018-04-22 DIAGNOSIS — Z9189 Other specified personal risk factors, not elsewhere classified: Secondary | ICD-10-CM | POA: Diagnosis not present

## 2018-04-22 DIAGNOSIS — I120 Hypertensive chronic kidney disease with stage 5 chronic kidney disease or end stage renal disease: Secondary | ICD-10-CM | POA: Diagnosis not present

## 2018-04-23 DIAGNOSIS — I120 Hypertensive chronic kidney disease with stage 5 chronic kidney disease or end stage renal disease: Secondary | ICD-10-CM | POA: Diagnosis not present

## 2018-04-23 DIAGNOSIS — Z940 Kidney transplant status: Secondary | ICD-10-CM | POA: Diagnosis not present

## 2018-04-24 DIAGNOSIS — I120 Hypertensive chronic kidney disease with stage 5 chronic kidney disease or end stage renal disease: Secondary | ICD-10-CM | POA: Diagnosis not present

## 2018-04-24 DIAGNOSIS — E859 Amyloidosis, unspecified: Secondary | ICD-10-CM | POA: Diagnosis not present

## 2018-04-24 DIAGNOSIS — Z4822 Encounter for aftercare following kidney transplant: Secondary | ICD-10-CM | POA: Diagnosis not present

## 2018-04-24 DIAGNOSIS — Z87891 Personal history of nicotine dependence: Secondary | ICD-10-CM | POA: Diagnosis not present

## 2018-04-24 DIAGNOSIS — Z940 Kidney transplant status: Secondary | ICD-10-CM | POA: Diagnosis not present

## 2018-04-24 DIAGNOSIS — N186 End stage renal disease: Secondary | ICD-10-CM | POA: Diagnosis not present

## 2018-04-25 DIAGNOSIS — I120 Hypertensive chronic kidney disease with stage 5 chronic kidney disease or end stage renal disease: Secondary | ICD-10-CM | POA: Diagnosis not present

## 2018-04-25 DIAGNOSIS — Z940 Kidney transplant status: Secondary | ICD-10-CM | POA: Diagnosis not present

## 2018-04-25 DIAGNOSIS — N186 End stage renal disease: Secondary | ICD-10-CM | POA: Diagnosis not present

## 2018-04-25 DIAGNOSIS — Z992 Dependence on renal dialysis: Secondary | ICD-10-CM | POA: Diagnosis not present

## 2018-04-26 DIAGNOSIS — I10 Essential (primary) hypertension: Secondary | ICD-10-CM | POA: Diagnosis not present

## 2018-04-26 DIAGNOSIS — Z79899 Other long term (current) drug therapy: Secondary | ICD-10-CM | POA: Diagnosis not present

## 2018-04-26 DIAGNOSIS — E859 Amyloidosis, unspecified: Secondary | ICD-10-CM | POA: Diagnosis not present

## 2018-04-26 DIAGNOSIS — B259 Cytomegaloviral disease, unspecified: Secondary | ICD-10-CM | POA: Diagnosis not present

## 2018-04-26 DIAGNOSIS — N189 Chronic kidney disease, unspecified: Secondary | ICD-10-CM | POA: Diagnosis not present

## 2018-04-26 DIAGNOSIS — I120 Hypertensive chronic kidney disease with stage 5 chronic kidney disease or end stage renal disease: Secondary | ICD-10-CM | POA: Diagnosis not present

## 2018-04-26 DIAGNOSIS — Z940 Kidney transplant status: Secondary | ICD-10-CM | POA: Diagnosis not present

## 2018-04-26 DIAGNOSIS — Z5181 Encounter for therapeutic drug level monitoring: Secondary | ICD-10-CM | POA: Diagnosis not present

## 2018-04-26 DIAGNOSIS — D649 Anemia, unspecified: Secondary | ICD-10-CM | POA: Diagnosis not present

## 2018-05-01 DIAGNOSIS — Z01818 Encounter for other preprocedural examination: Secondary | ICD-10-CM | POA: Diagnosis not present

## 2018-05-02 DIAGNOSIS — E43 Unspecified severe protein-calorie malnutrition: Secondary | ICD-10-CM | POA: Diagnosis present

## 2018-05-02 DIAGNOSIS — J449 Chronic obstructive pulmonary disease, unspecified: Secondary | ICD-10-CM | POA: Diagnosis present

## 2018-05-02 DIAGNOSIS — Z5181 Encounter for therapeutic drug level monitoring: Secondary | ICD-10-CM | POA: Diagnosis not present

## 2018-05-02 DIAGNOSIS — E875 Hyperkalemia: Secondary | ICD-10-CM | POA: Diagnosis not present

## 2018-05-02 DIAGNOSIS — Z940 Kidney transplant status: Secondary | ICD-10-CM | POA: Diagnosis not present

## 2018-05-02 DIAGNOSIS — Z87891 Personal history of nicotine dependence: Secondary | ICD-10-CM | POA: Diagnosis not present

## 2018-05-02 DIAGNOSIS — N390 Urinary tract infection, site not specified: Secondary | ICD-10-CM | POA: Diagnosis present

## 2018-05-02 DIAGNOSIS — R634 Abnormal weight loss: Secondary | ICD-10-CM | POA: Diagnosis not present

## 2018-05-02 DIAGNOSIS — I959 Hypotension, unspecified: Secondary | ICD-10-CM | POA: Diagnosis present

## 2018-05-02 DIAGNOSIS — Z9189 Other specified personal risk factors, not elsewhere classified: Secondary | ICD-10-CM | POA: Diagnosis not present

## 2018-05-02 DIAGNOSIS — Z6823 Body mass index (BMI) 23.0-23.9, adult: Secondary | ICD-10-CM | POA: Diagnosis not present

## 2018-05-02 DIAGNOSIS — E860 Dehydration: Secondary | ICD-10-CM | POA: Diagnosis present

## 2018-05-02 DIAGNOSIS — Z01818 Encounter for other preprocedural examination: Secondary | ICD-10-CM | POA: Diagnosis not present

## 2018-05-02 DIAGNOSIS — Z79899 Other long term (current) drug therapy: Secondary | ICD-10-CM | POA: Diagnosis not present

## 2018-05-02 DIAGNOSIS — Y830 Surgical operation with transplant of whole organ as the cause of abnormal reaction of the patient, or of later complication, without mention of misadventure at the time of the procedure: Secondary | ICD-10-CM | POA: Diagnosis present

## 2018-05-02 DIAGNOSIS — I498 Other specified cardiac arrhythmias: Secondary | ICD-10-CM | POA: Diagnosis not present

## 2018-05-02 DIAGNOSIS — E785 Hyperlipidemia, unspecified: Secondary | ICD-10-CM | POA: Diagnosis present

## 2018-05-02 DIAGNOSIS — N189 Chronic kidney disease, unspecified: Secondary | ICD-10-CM | POA: Diagnosis not present

## 2018-05-02 DIAGNOSIS — R627 Adult failure to thrive: Secondary | ICD-10-CM | POA: Diagnosis present

## 2018-05-02 DIAGNOSIS — B258 Other cytomegaloviral diseases: Secondary | ICD-10-CM | POA: Diagnosis present

## 2018-05-02 DIAGNOSIS — N400 Enlarged prostate without lower urinary tract symptoms: Secondary | ICD-10-CM | POA: Diagnosis present

## 2018-05-02 DIAGNOSIS — K5730 Diverticulosis of large intestine without perforation or abscess without bleeding: Secondary | ICD-10-CM | POA: Diagnosis not present

## 2018-05-02 DIAGNOSIS — Z466 Encounter for fitting and adjustment of urinary device: Secondary | ICD-10-CM | POA: Diagnosis not present

## 2018-05-02 DIAGNOSIS — I129 Hypertensive chronic kidney disease with stage 1 through stage 4 chronic kidney disease, or unspecified chronic kidney disease: Secondary | ICD-10-CM | POA: Diagnosis not present

## 2018-05-02 DIAGNOSIS — J9811 Atelectasis: Secondary | ICD-10-CM | POA: Diagnosis not present

## 2018-05-02 DIAGNOSIS — T8619 Other complication of kidney transplant: Secondary | ICD-10-CM | POA: Diagnosis present

## 2018-05-02 DIAGNOSIS — D72829 Elevated white blood cell count, unspecified: Secondary | ICD-10-CM | POA: Diagnosis not present

## 2018-05-02 DIAGNOSIS — N179 Acute kidney failure, unspecified: Secondary | ICD-10-CM | POA: Diagnosis present

## 2018-05-02 DIAGNOSIS — R197 Diarrhea, unspecified: Secondary | ICD-10-CM | POA: Diagnosis not present

## 2018-05-02 DIAGNOSIS — Z049 Encounter for examination and observation for unspecified reason: Secondary | ICD-10-CM | POA: Diagnosis not present

## 2018-05-02 DIAGNOSIS — R109 Unspecified abdominal pain: Secondary | ICD-10-CM | POA: Diagnosis not present

## 2018-05-02 DIAGNOSIS — Z7952 Long term (current) use of systemic steroids: Secondary | ICD-10-CM | POA: Diagnosis not present

## 2018-05-02 DIAGNOSIS — Z9049 Acquired absence of other specified parts of digestive tract: Secondary | ICD-10-CM | POA: Diagnosis not present

## 2018-05-02 DIAGNOSIS — B9689 Other specified bacterial agents as the cause of diseases classified elsewhere: Secondary | ICD-10-CM | POA: Diagnosis present

## 2018-05-03 DIAGNOSIS — Z01818 Encounter for other preprocedural examination: Secondary | ICD-10-CM | POA: Diagnosis not present

## 2018-05-03 DIAGNOSIS — R197 Diarrhea, unspecified: Secondary | ICD-10-CM | POA: Diagnosis not present

## 2018-05-03 DIAGNOSIS — Z940 Kidney transplant status: Secondary | ICD-10-CM | POA: Diagnosis not present

## 2018-05-03 DIAGNOSIS — E875 Hyperkalemia: Secondary | ICD-10-CM | POA: Diagnosis not present

## 2018-05-03 DIAGNOSIS — Z79899 Other long term (current) drug therapy: Secondary | ICD-10-CM | POA: Diagnosis not present

## 2018-05-03 DIAGNOSIS — Z9189 Other specified personal risk factors, not elsewhere classified: Secondary | ICD-10-CM | POA: Diagnosis not present

## 2018-05-04 DIAGNOSIS — Z940 Kidney transplant status: Secondary | ICD-10-CM | POA: Diagnosis not present

## 2018-05-04 DIAGNOSIS — Z049 Encounter for examination and observation for unspecified reason: Secondary | ICD-10-CM | POA: Diagnosis not present

## 2018-05-04 DIAGNOSIS — R197 Diarrhea, unspecified: Secondary | ICD-10-CM | POA: Diagnosis not present

## 2018-05-04 DIAGNOSIS — Z79899 Other long term (current) drug therapy: Secondary | ICD-10-CM | POA: Diagnosis not present

## 2018-05-04 DIAGNOSIS — Z9189 Other specified personal risk factors, not elsewhere classified: Secondary | ICD-10-CM | POA: Diagnosis not present

## 2018-05-04 DIAGNOSIS — Z01818 Encounter for other preprocedural examination: Secondary | ICD-10-CM | POA: Diagnosis not present

## 2018-05-04 DIAGNOSIS — I498 Other specified cardiac arrhythmias: Secondary | ICD-10-CM | POA: Diagnosis not present

## 2018-05-05 DIAGNOSIS — Z9189 Other specified personal risk factors, not elsewhere classified: Secondary | ICD-10-CM | POA: Diagnosis not present

## 2018-05-05 DIAGNOSIS — Z79899 Other long term (current) drug therapy: Secondary | ICD-10-CM | POA: Diagnosis not present

## 2018-05-05 DIAGNOSIS — R197 Diarrhea, unspecified: Secondary | ICD-10-CM | POA: Diagnosis not present

## 2018-05-05 DIAGNOSIS — Z940 Kidney transplant status: Secondary | ICD-10-CM | POA: Diagnosis not present

## 2018-05-05 DIAGNOSIS — Z01818 Encounter for other preprocedural examination: Secondary | ICD-10-CM | POA: Diagnosis not present

## 2018-05-06 DIAGNOSIS — Z940 Kidney transplant status: Secondary | ICD-10-CM | POA: Diagnosis not present

## 2018-05-06 DIAGNOSIS — E875 Hyperkalemia: Secondary | ICD-10-CM | POA: Diagnosis not present

## 2018-05-06 DIAGNOSIS — Z79899 Other long term (current) drug therapy: Secondary | ICD-10-CM | POA: Diagnosis not present

## 2018-05-06 DIAGNOSIS — R197 Diarrhea, unspecified: Secondary | ICD-10-CM | POA: Diagnosis not present

## 2018-05-06 DIAGNOSIS — Z01818 Encounter for other preprocedural examination: Secondary | ICD-10-CM | POA: Diagnosis not present

## 2018-05-06 DIAGNOSIS — Z9189 Other specified personal risk factors, not elsewhere classified: Secondary | ICD-10-CM | POA: Diagnosis not present

## 2018-05-07 DIAGNOSIS — R197 Diarrhea, unspecified: Secondary | ICD-10-CM | POA: Diagnosis not present

## 2018-05-07 DIAGNOSIS — E875 Hyperkalemia: Secondary | ICD-10-CM | POA: Diagnosis not present

## 2018-05-07 DIAGNOSIS — Z466 Encounter for fitting and adjustment of urinary device: Secondary | ICD-10-CM | POA: Diagnosis not present

## 2018-05-07 DIAGNOSIS — Z9189 Other specified personal risk factors, not elsewhere classified: Secondary | ICD-10-CM | POA: Diagnosis not present

## 2018-05-07 DIAGNOSIS — Z01818 Encounter for other preprocedural examination: Secondary | ICD-10-CM | POA: Diagnosis not present

## 2018-05-07 DIAGNOSIS — Z79899 Other long term (current) drug therapy: Secondary | ICD-10-CM | POA: Diagnosis not present

## 2018-05-07 DIAGNOSIS — Z940 Kidney transplant status: Secondary | ICD-10-CM | POA: Diagnosis not present

## 2018-05-08 DIAGNOSIS — Z9189 Other specified personal risk factors, not elsewhere classified: Secondary | ICD-10-CM | POA: Diagnosis not present

## 2018-05-08 DIAGNOSIS — E875 Hyperkalemia: Secondary | ICD-10-CM | POA: Diagnosis not present

## 2018-05-08 DIAGNOSIS — R197 Diarrhea, unspecified: Secondary | ICD-10-CM | POA: Diagnosis not present

## 2018-05-08 DIAGNOSIS — Z940 Kidney transplant status: Secondary | ICD-10-CM | POA: Diagnosis not present

## 2018-05-08 DIAGNOSIS — Z01818 Encounter for other preprocedural examination: Secondary | ICD-10-CM | POA: Diagnosis not present

## 2018-05-08 DIAGNOSIS — Z79899 Other long term (current) drug therapy: Secondary | ICD-10-CM | POA: Diagnosis not present

## 2018-05-09 DIAGNOSIS — E875 Hyperkalemia: Secondary | ICD-10-CM | POA: Diagnosis not present

## 2018-05-09 DIAGNOSIS — Z01818 Encounter for other preprocedural examination: Secondary | ICD-10-CM | POA: Diagnosis not present

## 2018-05-09 DIAGNOSIS — Z940 Kidney transplant status: Secondary | ICD-10-CM | POA: Diagnosis not present

## 2018-05-09 DIAGNOSIS — Z9189 Other specified personal risk factors, not elsewhere classified: Secondary | ICD-10-CM | POA: Diagnosis not present

## 2018-05-09 DIAGNOSIS — R197 Diarrhea, unspecified: Secondary | ICD-10-CM | POA: Diagnosis not present

## 2018-05-09 DIAGNOSIS — Z79899 Other long term (current) drug therapy: Secondary | ICD-10-CM | POA: Diagnosis not present

## 2018-05-10 DIAGNOSIS — Z01818 Encounter for other preprocedural examination: Secondary | ICD-10-CM | POA: Diagnosis not present

## 2018-05-13 DIAGNOSIS — Z01818 Encounter for other preprocedural examination: Secondary | ICD-10-CM | POA: Diagnosis not present

## 2018-05-14 DIAGNOSIS — Z01818 Encounter for other preprocedural examination: Secondary | ICD-10-CM | POA: Diagnosis not present

## 2018-05-14 DIAGNOSIS — N189 Chronic kidney disease, unspecified: Secondary | ICD-10-CM | POA: Diagnosis not present

## 2018-05-14 DIAGNOSIS — R197 Diarrhea, unspecified: Secondary | ICD-10-CM | POA: Diagnosis not present

## 2018-05-14 DIAGNOSIS — Z5181 Encounter for therapeutic drug level monitoring: Secondary | ICD-10-CM | POA: Diagnosis not present

## 2018-05-14 DIAGNOSIS — B259 Cytomegaloviral disease, unspecified: Secondary | ICD-10-CM | POA: Diagnosis not present

## 2018-05-14 DIAGNOSIS — Z79899 Other long term (current) drug therapy: Secondary | ICD-10-CM | POA: Diagnosis not present

## 2018-05-14 DIAGNOSIS — R109 Unspecified abdominal pain: Secondary | ICD-10-CM | POA: Diagnosis not present

## 2018-05-14 DIAGNOSIS — I129 Hypertensive chronic kidney disease with stage 1 through stage 4 chronic kidney disease, or unspecified chronic kidney disease: Secondary | ICD-10-CM | POA: Diagnosis not present

## 2018-05-14 DIAGNOSIS — Z940 Kidney transplant status: Secondary | ICD-10-CM | POA: Diagnosis not present

## 2018-05-14 DIAGNOSIS — Z452 Encounter for adjustment and management of vascular access device: Secondary | ICD-10-CM | POA: Diagnosis not present

## 2018-05-15 DIAGNOSIS — Z01818 Encounter for other preprocedural examination: Secondary | ICD-10-CM | POA: Diagnosis not present

## 2018-05-20 DIAGNOSIS — H02889 Meibomian gland dysfunction of unspecified eye, unspecified eyelid: Secondary | ICD-10-CM | POA: Diagnosis not present

## 2018-05-20 DIAGNOSIS — Z9841 Cataract extraction status, right eye: Secondary | ICD-10-CM | POA: Diagnosis not present

## 2018-05-20 DIAGNOSIS — H02834 Dermatochalasis of left upper eyelid: Secondary | ICD-10-CM | POA: Diagnosis not present

## 2018-05-20 DIAGNOSIS — H353231 Exudative age-related macular degeneration, bilateral, with active choroidal neovascularization: Secondary | ICD-10-CM | POA: Diagnosis not present

## 2018-05-20 DIAGNOSIS — H26491 Other secondary cataract, right eye: Secondary | ICD-10-CM | POA: Diagnosis not present

## 2018-05-20 DIAGNOSIS — H04123 Dry eye syndrome of bilateral lacrimal glands: Secondary | ICD-10-CM | POA: Diagnosis not present

## 2018-05-20 DIAGNOSIS — H02831 Dermatochalasis of right upper eyelid: Secondary | ICD-10-CM | POA: Diagnosis not present

## 2018-05-20 DIAGNOSIS — Z961 Presence of intraocular lens: Secondary | ICD-10-CM | POA: Diagnosis not present

## 2018-05-20 DIAGNOSIS — Z9842 Cataract extraction status, left eye: Secondary | ICD-10-CM | POA: Diagnosis not present

## 2018-05-20 DIAGNOSIS — H35713 Central serous chorioretinopathy, bilateral: Secondary | ICD-10-CM | POA: Diagnosis not present

## 2018-05-21 DIAGNOSIS — H35723 Serous detachment of retinal pigment epithelium, bilateral: Secondary | ICD-10-CM | POA: Diagnosis not present

## 2018-05-21 DIAGNOSIS — H353231 Exudative age-related macular degeneration, bilateral, with active choroidal neovascularization: Secondary | ICD-10-CM | POA: Diagnosis not present

## 2018-05-21 DIAGNOSIS — H35713 Central serous chorioretinopathy, bilateral: Secondary | ICD-10-CM | POA: Diagnosis not present

## 2018-05-27 DIAGNOSIS — Z01818 Encounter for other preprocedural examination: Secondary | ICD-10-CM | POA: Diagnosis not present

## 2018-05-28 DIAGNOSIS — I129 Hypertensive chronic kidney disease with stage 1 through stage 4 chronic kidney disease, or unspecified chronic kidney disease: Secondary | ICD-10-CM | POA: Diagnosis not present

## 2018-05-28 DIAGNOSIS — D649 Anemia, unspecified: Secondary | ICD-10-CM | POA: Diagnosis not present

## 2018-05-28 DIAGNOSIS — N189 Chronic kidney disease, unspecified: Secondary | ICD-10-CM | POA: Diagnosis not present

## 2018-05-28 DIAGNOSIS — Z940 Kidney transplant status: Secondary | ICD-10-CM | POA: Diagnosis not present

## 2018-05-28 DIAGNOSIS — Z79899 Other long term (current) drug therapy: Secondary | ICD-10-CM | POA: Diagnosis not present

## 2018-05-28 DIAGNOSIS — Z01818 Encounter for other preprocedural examination: Secondary | ICD-10-CM | POA: Diagnosis not present

## 2018-05-28 DIAGNOSIS — B259 Cytomegaloviral disease, unspecified: Secondary | ICD-10-CM | POA: Diagnosis not present

## 2018-05-28 DIAGNOSIS — R031 Nonspecific low blood-pressure reading: Secondary | ICD-10-CM | POA: Diagnosis not present

## 2018-05-28 DIAGNOSIS — R197 Diarrhea, unspecified: Secondary | ICD-10-CM | POA: Diagnosis not present

## 2018-05-28 DIAGNOSIS — Z5181 Encounter for therapeutic drug level monitoring: Secondary | ICD-10-CM | POA: Diagnosis not present

## 2018-05-30 ENCOUNTER — Telehealth: Payer: Self-pay | Admitting: Family Medicine

## 2018-05-30 NOTE — Telephone Encounter (Signed)
Received multiple forms from [employer] for hospital indemnity claims for hospitalizations in 07/2017 for adrenal gland bleed, 08/2017 for sever abd pain, and 02/2018 for cholecystectomy.  Routed to provider for review.

## 2018-05-30 NOTE — Telephone Encounter (Signed)
Claudine bandi dropped off 3 forms to be filled out placed into yellow folder.

## 2018-05-30 NOTE — Telephone Encounter (Signed)
Awaiting forms

## 2018-06-07 ENCOUNTER — Other Ambulatory Visit: Payer: Self-pay | Admitting: *Deleted

## 2018-06-07 NOTE — Patient Outreach (Signed)
[facility] [facility]) Care Management  06/07/2018  Joseph [name] [facility] 12-08-1965 [id number]   Subjective: Telephone call to patient's home  number, no answer, left HIPAA compliant voicemail message, and requested call back.    Objective: Per KPN (Knowledge Performance Now, point of care tool) and chart review,patient hospitalized 05/02/18, unable to verify dates of service, unknown discharge date, for diarrhea a [place].   Patient hospitalized 03/07/18 -03/12/18 for cholecystitis, status post Laparoscopic cholecystectomy with repair of ventral hernia on 03/08/18.  Patient hospitalized 09/23/17 -09/25/17 forAdrenal hemorrhageand chest pain. Patient hospitalized 08/06/17 -08/09/17 forChest painlikely referred pain from his right adrenal gland hemorrhage.Patient hospitalized 01/29/17 - 01/31/17 for sepsis due to right lower lobe pneumonia.Patient also has a history ofEnd-stage renal disease-secondary to amyloidosis, on peritoneal dialysis,dry eyes, hypertension, hyperlipidemia, and anemia.[facility] Care Management completed transition of care follow up call on 03/13/18.    Assessment: Received UMR Transition of care referral on11/13/19.Transition of care follow up pending patient contact.       Plan:RNCM will send unsuccessful outreach  letter, [facility] pamphlet, will call patient for 2nd telephone outreach attempt, transition of care follow up, and proceed with case closure, within 10 business days if no return call.  Case transitioned to Rhetta Mura @ [place] Management for Transition of care follow up.        Daelyn Mozer H. Annia Friendly, BSN, [place] Management [facility] Telephonic CM Phone: [phone] Fax: [phone]

## 2018-06-10 DIAGNOSIS — Z01818 Encounter for other preprocedural examination: Secondary | ICD-10-CM | POA: Diagnosis not present

## 2018-06-11 DIAGNOSIS — B259 Cytomegaloviral disease, unspecified: Secondary | ICD-10-CM | POA: Diagnosis not present

## 2018-06-11 DIAGNOSIS — Z79899 Other long term (current) drug therapy: Secondary | ICD-10-CM | POA: Diagnosis not present

## 2018-06-11 DIAGNOSIS — N189 Chronic kidney disease, unspecified: Secondary | ICD-10-CM | POA: Diagnosis not present

## 2018-06-11 DIAGNOSIS — I129 Hypertensive chronic kidney disease with stage 1 through stage 4 chronic kidney disease, or unspecified chronic kidney disease: Secondary | ICD-10-CM | POA: Diagnosis not present

## 2018-06-11 DIAGNOSIS — Z5181 Encounter for therapeutic drug level monitoring: Secondary | ICD-10-CM | POA: Diagnosis not present

## 2018-06-11 DIAGNOSIS — D649 Anemia, unspecified: Secondary | ICD-10-CM | POA: Diagnosis not present

## 2018-06-11 DIAGNOSIS — Z940 Kidney transplant status: Secondary | ICD-10-CM | POA: Diagnosis not present

## 2018-06-11 DIAGNOSIS — Z01818 Encounter for other preprocedural examination: Secondary | ICD-10-CM | POA: Diagnosis not present

## 2018-06-13 ENCOUNTER — Encounter: Payer: Self-pay | Admitting: *Deleted

## 2018-06-13 ENCOUNTER — Other Ambulatory Visit: Payer: Self-pay | Admitting: *Deleted

## 2018-06-14 NOTE — Patient Outreach (Addendum)
[name]) Care Management  06/14/2018  Joseph [name] [facility] Nov 14, 1965 [id number]   Late entry from 06/13/18 @ 1457  Subjective: Telephone call to patient's home number, spoke with patient, and HIPAA verified.  Discussed [facility] Care Management UMR Transition of care follow up, patient voiced understanding, and is in agreement to follow up.  Patient states he remembers speaking with this RNCM in the past.   States he is doing well, was hospitalized 05/02/18 - 05/09/18 for medication adjustment related to status post 04/17/18 kidney transplant at [name] [name].  States he has completed weekly, biweekly, and is now on monthly follow up appointment with his transplant team at [name].  States his last appointment was on 06/11/18, appointment went well, and next appointment is on 07/19/18.  Patient states he is able to manage self care and has assistance as needed.  Patient voices understanding of medical diagnosis and treatment plan.   States he is accessing the following [facility] benefits: outpatient pharmacy, hospital indemnity, and wife who is the [employer], has exhausted family medical leave act (FMLA), and is taking time off as needed to assist him. Patient states he does not have any education material, transition of care, care coordination, disease management, disease monitoring, transportation, community resource, or pharmacy needs at this time. States he is very appreciative of the follow up and is in agreement to receive [place] Management information.      Objective:Per KPN (Knowledge Performance Now, point of care tool) and chart review,patient hospitalized 05/02/18, unable to verify dates of service, unknown discharge date, for diarrhea at [place].   Patient hospitalized 03/07/18 -03/12/18 for cholecystitis, status postLaparoscopic cholecystectomy with repair of ventral herniaon  03/08/18. Patient hospitalized3/31/19 -4/2/19forAdrenal hemorrhageand chest pain. Patient hospitalized 08/06/17 -08/09/17 forChest painlikely referred pain from his right adrenal gland hemorrhage.Patient hospitalized 01/29/17 - 01/31/17 for sepsis due to right lower lobe pneumonia.Patient also has a history ofEnd-stage renal disease-secondary to amyloidosis, on peritoneal dialysis,dry eyes, hypertension, hyperlipidemia, and anemia.[facility] Care Management completed transition of care follow up call on 03/13/18.      Assessment: Received UMR Transition of care referral on11/13/19.Transition of care follow up completed, no care management needs, and will proceed with case closure.       Plan:RNCM will send patient successful outreach letter, [facility] pamphlet, and magnet. RNCM will complete case closure due to follow up completed / no care management needs.          Joseph [name] H. Annia Friendly, BSN, [place] Management [facility] Telephonic CM Phone: [phone] Fax: [phone]

## 2018-06-21 ENCOUNTER — Other Ambulatory Visit: Payer: Self-pay | Admitting: *Deleted

## 2018-06-21 NOTE — Telephone Encounter (Signed)
Received fax requesting refill on atovaquone (MEPRON) 750 MG/5ML suspension.  Ok to refill?

## 2018-06-25 DIAGNOSIS — H35713 Central serous chorioretinopathy, bilateral: Secondary | ICD-10-CM | POA: Diagnosis not present

## 2018-06-25 DIAGNOSIS — H353231 Exudative age-related macular degeneration, bilateral, with active choroidal neovascularization: Secondary | ICD-10-CM | POA: Diagnosis not present

## 2018-07-02 ENCOUNTER — Other Ambulatory Visit: Payer: Self-pay | Admitting: *Deleted

## 2018-07-02 ENCOUNTER — Encounter: Payer: Self-pay | Admitting: Family Medicine

## 2018-07-02 ENCOUNTER — Other Ambulatory Visit: Payer: Self-pay

## 2018-07-02 ENCOUNTER — Ambulatory Visit (INDEPENDENT_AMBULATORY_CARE_PROVIDER_SITE_OTHER): Payer: Medicare Other | Admitting: Family Medicine

## 2018-07-02 VITALS — BP 120/86 | HR 86 | Temp 98.3°F | Resp 16 | Ht 71.0 in | Wt 174.0 lb

## 2018-07-02 DIAGNOSIS — R197 Diarrhea, unspecified: Secondary | ICD-10-CM

## 2018-07-02 DIAGNOSIS — B349 Viral infection, unspecified: Secondary | ICD-10-CM | POA: Diagnosis not present

## 2018-07-02 DIAGNOSIS — E860 Dehydration: Secondary | ICD-10-CM | POA: Diagnosis not present

## 2018-07-02 DIAGNOSIS — R509 Fever, unspecified: Secondary | ICD-10-CM | POA: Diagnosis not present

## 2018-07-02 DIAGNOSIS — R7989 Other specified abnormal findings of blood chemistry: Secondary | ICD-10-CM

## 2018-07-02 LAB — COMPREHENSIVE METABOLIC PANEL
AG RATIO: 2.2 (calc) (ref 1.0–2.5)
ALBUMIN MSPROF: 4.1 g/dL (ref 3.6–5.1)
ALT: 23 U/L (ref 9–46)
AST: 21 U/L (ref 10–35)
Alkaline phosphatase (APISO): 130 U/L — ABNORMAL HIGH (ref 40–115)
BUN / CREAT RATIO: 14 (calc) (ref 6–22)
BUN: 29 mg/dL — ABNORMAL HIGH (ref 7–25)
CHLORIDE: 112 mmol/L — AB (ref 98–110)
CO2: 21 mmol/L (ref 20–32)
Calcium: 9.1 mg/dL (ref 8.6–10.3)
Creat: 2.1 mg/dL — ABNORMAL HIGH (ref 0.70–1.33)
GLOBULIN: 1.9 g/dL (ref 1.9–3.7)
GLUCOSE: 184 mg/dL — AB (ref 65–99)
POTASSIUM: 4.4 mmol/L (ref 3.5–5.3)
SODIUM: 140 mmol/L (ref 135–146)
TOTAL PROTEIN: 6 g/dL — AB (ref 6.1–8.1)
Total Bilirubin: 0.4 mg/dL (ref 0.2–1.2)

## 2018-07-02 LAB — INFLUENZA A AND B AG, IMMUNOASSAY
INFLUENZA A ANTIGEN: NOT DETECTED
INFLUENZA B ANTIGEN: NOT DETECTED

## 2018-07-02 LAB — CBC WITH DIFFERENTIAL/PLATELET
Absolute Monocytes: 300 cells/uL (ref 200–950)
BASOS PCT: 2.1 %
Basophils Absolute: 158 cells/uL (ref 0–200)
EOS PCT: 8.9 %
Eosinophils Absolute: 668 cells/uL — ABNORMAL HIGH (ref 15–500)
HCT: 33.9 % — ABNORMAL LOW (ref 38.5–50.0)
HEMOGLOBIN: 10.8 g/dL — AB (ref 13.2–17.1)
Lymphs Abs: 833 cells/uL — ABNORMAL LOW (ref 850–3900)
MCH: 30.8 pg (ref 27.0–33.0)
MCHC: 31.9 g/dL — ABNORMAL LOW (ref 32.0–36.0)
MCV: 96.6 fL (ref 80.0–100.0)
MONOS PCT: 4 %
MPV: 10.5 fL (ref 7.5–12.5)
NEUTROS ABS: 5543 {cells}/uL (ref 1500–7800)
Neutrophils Relative %: 73.9 %
Platelets: 293 10*3/uL (ref 140–400)
RBC: 3.51 10*6/uL — AB (ref 4.20–5.80)
RDW: 17.7 % — ABNORMAL HIGH (ref 11.0–15.0)
Total Lymphocyte: 11.1 %
WBC: 7.5 10*3/uL (ref 3.8–10.8)

## 2018-07-02 MED ORDER — ALBUTEROL SULFATE HFA 108 (90 BASE) MCG/ACT IN AERS: 2.0000 | INHALATION_SPRAY | Freq: Four times a day (QID) | RESPIRATORY_TRACT | 2 refills | Status: AC | PRN

## 2018-07-02 MED ORDER — OXYCODONE HCL 5 MG PO TABS: 5.0000 mg | ORAL_TABLET | Freq: Four times a day (QID) | ORAL | 0 refills | Status: DC | PRN

## 2018-07-02 MED ORDER — AZITHROMYCIN 500 MG PO TABS: 500.0000 mg | ORAL_TABLET | Freq: Every day | ORAL | 0 refills | Status: AC

## 2018-07-02 MED ORDER — ONDANSETRON 4 MG PO TBDP: 4.0000 mg | ORAL_TABLET | Freq: Three times a day (TID) | ORAL | 0 refills | Status: DC | PRN

## 2018-07-02 NOTE — Patient Instructions (Signed)
F/u PENDING results of bloodwork Pain meds sent to pharmacy

## 2018-07-02 NOTE — Progress Notes (Signed)
   Subjective:    Patient ID: Joseph [name], male    DOB: 1966-02-02, 53 y.o.   MRN: [id number]  Patient presents for Illness (x1 weeks- cough, URI, nasal congstion, diarrhea)  With his wife.  He is status post renal transplant he was rehospitalized after his transplant in December secondary to diarrhea and dehydration he had significant work-up this was thought to be a side effect of his transplant medication which they did adjust.  He was doing fairly well trying to recover into the past week and a half where he began having cough congestion sore throat and the diarrhea started again yesterday.  He had about 5 loose stools no blood in the stool.  He has been trying to keep hydrated but does feel little dehydrated.  His T-max was 99.9 F He has not had any vomiting but has been a little nauseous.  Wife is also been sick with similar symptoms. He did take Coricidin which helped his cough.  He also requests a refill on his oxycodone pain medicine his Zofran as well as his albuterol inhaler  Review Of Systems:  GEN- + fatigue, +fever, weight loss,weakness, recent illness HEENT- denies eye drainage, change in vision,+ nasal discharge, CVS- denies chest pain, palpitations RESP- denies SOB, +cough, wheeze ABD- denies N/V, +change in stools, abd pain GU- denies dysuria, hematuria, dribbling, incontinence MSK- denies joint pain, muscle aches, injury Neuro- denies headache, dizziness, syncope, seizure activity       Objective:    BP 120/86   Pulse 86   Temp 98.3 F (36.8 C) (Oral)   Resp 16   Ht 5\' 11"  (1.803 m)   Wt 174 lb (78.9 kg)   SpO2 98%   BMI 24.27 kg/m  GEN- NAD, alert and oriented x3 HEENT- PERRL, EOMI, non injected sclera, pink conjunctiva dry MM, oropharynx clear, TM clear bilat, nasal discharge, no maxillary sinus tenderness Neck- Supple, no LAD CVS- RRR, no murmur RESP-CTAB ABD-NABS,soft,NT,ND EXT- No edema Pulses- Radial 2+, cap refill 3-4 sec         Assessment  & Plan:      Problem List Items Addressed This Visit    None    Visit Diagnoses    Viral illness    -  Primary   Flu swab neg, suspect more viral illness as he is immucocompromised, STAT labs for CBC/ CMET sent, corcidan okay, zofran and oxycocone refilled We will review his labs if he does have a significant elevated white blood cell or other illness concern for bacterial superinfection will cover him with antibiotics azithromycin    Relevant Orders   Influenza A and B Ag, Immunoassay (Completed)   CBC with Differential/Platelet (Completed)   Comprehensive metabolic panel   Diarrhea, unspecified type       He has had workup, no c diff or other bacterial infection, suspect diarrhea due to viral illness at this time    Dehydration       Given 1 L bolus in office       Note: This dictation was prepared with Dragon dictation along with smaller phrase technology. Any transcriptional errors that result from this process are unintentional.

## 2018-07-03 ENCOUNTER — Other Ambulatory Visit: Payer: Medicare Other

## 2018-07-03 ENCOUNTER — Other Ambulatory Visit: Payer: Self-pay | Admitting: *Deleted

## 2018-07-03 DIAGNOSIS — E860 Dehydration: Secondary | ICD-10-CM

## 2018-07-03 DIAGNOSIS — R7989 Other specified abnormal findings of blood chemistry: Secondary | ICD-10-CM | POA: Diagnosis not present

## 2018-07-03 DIAGNOSIS — B349 Viral infection, unspecified: Secondary | ICD-10-CM | POA: Diagnosis not present

## 2018-07-03 DIAGNOSIS — N179 Acute kidney failure, unspecified: Secondary | ICD-10-CM

## 2018-07-03 LAB — BASIC METABOLIC PANEL
BUN/Creatinine Ratio: 13 (calc) (ref 6–22)
BUN: 26 mg/dL — ABNORMAL HIGH (ref 7–25)
CHLORIDE: 110 mmol/L (ref 98–110)
CO2: 21 mmol/L (ref 20–32)
Calcium: 8.9 mg/dL (ref 8.6–10.3)
Creat: 2 mg/dL — ABNORMAL HIGH (ref 0.70–1.33)
Glucose, Bld: 113 mg/dL — ABNORMAL HIGH (ref 65–99)
Potassium: 4.8 mmol/L (ref 3.5–5.3)
Sodium: 140 mmol/L (ref 135–146)

## 2018-07-05 ENCOUNTER — Other Ambulatory Visit: Payer: Medicare Other

## 2018-07-05 DIAGNOSIS — N179 Acute kidney failure, unspecified: Secondary | ICD-10-CM | POA: Diagnosis not present

## 2018-07-05 LAB — BASIC METABOLIC PANEL
BUN/Creatinine Ratio: 13 (calc) (ref 6–22)
BUN: 28 mg/dL — AB (ref 7–25)
CO2: 22 mmol/L (ref 20–32)
Calcium: 9.4 mg/dL (ref 8.6–10.3)
Chloride: 112 mmol/L — ABNORMAL HIGH (ref 98–110)
Creat: 2.11 mg/dL — ABNORMAL HIGH (ref 0.70–1.33)
Glucose, Bld: 122 mg/dL — ABNORMAL HIGH (ref 65–99)
Potassium: 4.7 mmol/L (ref 3.5–5.3)
Sodium: 141 mmol/L (ref 135–146)

## 2018-07-06 ENCOUNTER — Encounter: Payer: Self-pay | Admitting: Family Medicine

## 2018-07-08 DIAGNOSIS — Z01818 Encounter for other preprocedural examination: Secondary | ICD-10-CM | POA: Diagnosis not present

## 2018-07-09 DIAGNOSIS — Z940 Kidney transplant status: Secondary | ICD-10-CM | POA: Diagnosis not present

## 2018-07-09 DIAGNOSIS — B259 Cytomegaloviral disease, unspecified: Secondary | ICD-10-CM | POA: Diagnosis not present

## 2018-07-09 DIAGNOSIS — Z79899 Other long term (current) drug therapy: Secondary | ICD-10-CM | POA: Diagnosis not present

## 2018-07-09 DIAGNOSIS — R197 Diarrhea, unspecified: Secondary | ICD-10-CM | POA: Diagnosis not present

## 2018-07-09 DIAGNOSIS — D649 Anemia, unspecified: Secondary | ICD-10-CM | POA: Diagnosis not present

## 2018-07-09 DIAGNOSIS — N179 Acute kidney failure, unspecified: Secondary | ICD-10-CM | POA: Diagnosis not present

## 2018-07-09 DIAGNOSIS — N189 Chronic kidney disease, unspecified: Secondary | ICD-10-CM | POA: Diagnosis not present

## 2018-07-09 DIAGNOSIS — R031 Nonspecific low blood-pressure reading: Secondary | ICD-10-CM | POA: Diagnosis not present

## 2018-07-09 DIAGNOSIS — Z01818 Encounter for other preprocedural examination: Secondary | ICD-10-CM | POA: Diagnosis not present

## 2018-07-10 DIAGNOSIS — Z01818 Encounter for other preprocedural examination: Secondary | ICD-10-CM | POA: Diagnosis not present

## 2018-07-15 ENCOUNTER — Telehealth: Payer: Self-pay | Admitting: *Deleted

## 2018-07-15 NOTE — Telephone Encounter (Signed)
Please see MyChart message.

## 2018-07-17 ENCOUNTER — Other Ambulatory Visit: Payer: Self-pay | Admitting: *Deleted

## 2018-07-17 DIAGNOSIS — Z940 Kidney transplant status: Secondary | ICD-10-CM

## 2018-07-18 ENCOUNTER — Other Ambulatory Visit: Payer: Medicare Other

## 2018-07-18 DIAGNOSIS — Z940 Kidney transplant status: Secondary | ICD-10-CM

## 2018-07-19 LAB — COMPLETE METABOLIC PANEL WITH GFR
AG Ratio: 1.7 (calc) (ref 1.0–2.5)
ALT: 22 U/L (ref 9–46)
AST: 18 U/L (ref 10–35)
Albumin: 3.8 g/dL (ref 3.6–5.1)
Alkaline phosphatase (APISO): 99 U/L (ref 40–115)
BUN / CREAT RATIO: 12 (calc) (ref 6–22)
BUN: 23 mg/dL (ref 7–25)
CO2: 25 mmol/L (ref 20–32)
Calcium: 9.3 mg/dL (ref 8.6–10.3)
Chloride: 111 mmol/L — ABNORMAL HIGH (ref 98–110)
Creat: 1.85 mg/dL — ABNORMAL HIGH (ref 0.70–1.33)
GFR, EST AFRICAN AMERICAN: 46 mL/min/{1.73_m2} — AB (ref >=60)
GFR, Est Non African American: 40 mL/min/{1.73_m2} — ABNORMAL LOW (ref >=60)
GLUCOSE: 108 mg/dL — AB (ref 65–99)
Globulin: 2.2 g/dL (calc) (ref 1.9–3.7)
Potassium: 4.3 mmol/L (ref 3.5–5.3)
Sodium: 144 mmol/L (ref 135–146)
Total Bilirubin: 0.5 mg/dL (ref 0.2–1.2)
Total Protein: 6 g/dL — ABNORMAL LOW (ref 6.1–8.1)

## 2018-07-19 LAB — CBC WITH DIFFERENTIAL/PLATELET
Absolute Monocytes: 351 cells/uL (ref 200–950)
Basophils Absolute: 94 cells/uL (ref 0–200)
Basophils Relative: 1.2 %
Eosinophils Absolute: 281 cells/uL (ref 15–500)
Eosinophils Relative: 3.6 %
HEMATOCRIT: 34.1 % — AB (ref 38.5–50.0)
Hemoglobin: 11.1 g/dL — ABNORMAL LOW (ref 13.2–17.1)
Lymphs Abs: 702 cells/uL — ABNORMAL LOW (ref 850–3900)
MCH: 31.4 pg (ref 27.0–33.0)
MCHC: 32.6 g/dL (ref 32.0–36.0)
MCV: 96.6 fL (ref 80.0–100.0)
MPV: 10.7 fL (ref 7.5–12.5)
Monocytes Relative: 4.5 %
Neutro Abs: 6373 cells/uL (ref 1500–7800)
Neutrophils Relative %: 81.7 %
Platelets: 247 10*3/uL (ref 140–400)
RBC: 3.53 10*6/uL — ABNORMAL LOW (ref 4.20–5.80)
RDW: 17.9 % — ABNORMAL HIGH (ref 11.0–15.0)
Total Lymphocyte: 9 %
WBC: 7.8 10*3/uL (ref 3.8–10.8)

## 2018-07-19 LAB — MAGNESIUM: Magnesium: 1.3 mg/dL — ABNORMAL LOW (ref 1.5–2.5)

## 2018-07-19 LAB — TACROLIMUS LEVEL: Tacrolimus (FK506), Blood: 8.4 ng/mL

## 2018-07-19 LAB — PHOSPHORUS: Phosphorus: 3.5 mg/dL (ref 2.5–4.5)

## 2018-07-22 DIAGNOSIS — Z940 Kidney transplant status: Secondary | ICD-10-CM | POA: Diagnosis not present

## 2018-07-22 DIAGNOSIS — D631 Anemia in chronic kidney disease: Secondary | ICD-10-CM | POA: Diagnosis not present

## 2018-07-22 DIAGNOSIS — N186 End stage renal disease: Secondary | ICD-10-CM | POA: Diagnosis not present

## 2018-07-23 DIAGNOSIS — H353231 Exudative age-related macular degeneration, bilateral, with active choroidal neovascularization: Secondary | ICD-10-CM | POA: Diagnosis not present

## 2018-07-23 DIAGNOSIS — H35713 Central serous chorioretinopathy, bilateral: Secondary | ICD-10-CM | POA: Diagnosis not present

## 2018-08-19 DIAGNOSIS — Z01818 Encounter for other preprocedural examination: Secondary | ICD-10-CM | POA: Diagnosis not present

## 2018-08-20 DIAGNOSIS — Z940 Kidney transplant status: Secondary | ICD-10-CM | POA: Diagnosis not present

## 2018-08-20 DIAGNOSIS — Z01818 Encounter for other preprocedural examination: Secondary | ICD-10-CM | POA: Diagnosis not present

## 2018-08-20 DIAGNOSIS — Z87891 Personal history of nicotine dependence: Secondary | ICD-10-CM | POA: Diagnosis not present

## 2018-08-20 DIAGNOSIS — N179 Acute kidney failure, unspecified: Secondary | ICD-10-CM | POA: Diagnosis not present

## 2018-08-20 DIAGNOSIS — T8619 Other complication of kidney transplant: Secondary | ICD-10-CM | POA: Diagnosis not present

## 2018-08-20 DIAGNOSIS — I10 Essential (primary) hypertension: Secondary | ICD-10-CM | POA: Diagnosis not present

## 2018-08-21 DIAGNOSIS — Z01818 Encounter for other preprocedural examination: Secondary | ICD-10-CM | POA: Diagnosis not present

## 2018-08-27 DIAGNOSIS — H353231 Exudative age-related macular degeneration, bilateral, with active choroidal neovascularization: Secondary | ICD-10-CM | POA: Diagnosis not present

## 2018-08-27 DIAGNOSIS — H35713 Central serous chorioretinopathy, bilateral: Secondary | ICD-10-CM | POA: Diagnosis not present

## 2018-09-02 ENCOUNTER — Other Ambulatory Visit: Payer: Self-pay | Admitting: Family Medicine

## 2018-09-09 ENCOUNTER — Other Ambulatory Visit: Payer: Self-pay

## 2018-09-09 ENCOUNTER — Other Ambulatory Visit: Payer: Medicare Other

## 2018-09-09 DIAGNOSIS — Z940 Kidney transplant status: Secondary | ICD-10-CM | POA: Diagnosis not present

## 2018-09-10 LAB — COMPLETE METABOLIC PANEL WITH GFR
AG Ratio: 1.9 (calc) (ref 1.0–2.5)
ALKALINE PHOSPHATASE (APISO): 87 U/L (ref 35–144)
ALT: 16 U/L (ref 9–46)
AST: 18 U/L (ref 10–35)
Albumin: 4 g/dL (ref 3.6–5.1)
BUN/Creatinine Ratio: 13 (calc) (ref 6–22)
BUN: 24 mg/dL (ref 7–25)
CO2: 26 mmol/L (ref 20–32)
Calcium: 9.4 mg/dL (ref 8.6–10.3)
Chloride: 110 mmol/L (ref 98–110)
Creat: 1.92 mg/dL — ABNORMAL HIGH (ref 0.70–1.33)
GFR, Est African American: 44 mL/min/{1.73_m2} — ABNORMAL LOW (ref >=60)
GFR, Est Non African American: 38 mL/min/{1.73_m2} — ABNORMAL LOW (ref >=60)
GLUCOSE: 100 mg/dL — AB (ref 65–99)
Globulin: 2.1 g/dL (calc) (ref 1.9–3.7)
Potassium: 4.4 mmol/L (ref 3.5–5.3)
Sodium: 143 mmol/L (ref 135–146)
Total Bilirubin: 0.7 mg/dL (ref 0.2–1.2)
Total Protein: 6.1 g/dL (ref 6.1–8.1)

## 2018-09-10 LAB — CBC WITH DIFFERENTIAL/PLATELET
Absolute Monocytes: 488 cells/uL (ref 200–950)
Basophils Absolute: 83 cells/uL (ref 0–200)
Basophils Relative: 1.1 %
EOS ABS: 683 {cells}/uL — AB (ref 15–500)
Eosinophils Relative: 9.1 %
HCT: 37.3 % — ABNORMAL LOW (ref 38.5–50.0)
Hemoglobin: 12.3 g/dL — ABNORMAL LOW (ref 13.2–17.1)
Lymphs Abs: 923 cells/uL (ref 850–3900)
MCH: 31 pg (ref 27.0–33.0)
MCHC: 33 g/dL (ref 32.0–36.0)
MCV: 94 fL (ref 80.0–100.0)
MPV: 10.9 fL (ref 7.5–12.5)
Monocytes Relative: 6.5 %
NEUTROS ABS: 5325 {cells}/uL (ref 1500–7800)
Neutrophils Relative %: 71 %
Platelets: 186 10*3/uL (ref 140–400)
RBC: 3.97 10*6/uL — ABNORMAL LOW (ref 4.20–5.80)
RDW: 15.7 % — ABNORMAL HIGH (ref 11.0–15.0)
Total Lymphocyte: 12.3 %
WBC: 7.5 10*3/uL (ref 3.8–10.8)

## 2018-09-10 LAB — PHOSPHORUS: Phosphorus: 3.9 mg/dL (ref 2.5–4.5)

## 2018-09-10 LAB — MAGNESIUM: MAGNESIUM: 1.6 mg/dL (ref 1.5–2.5)

## 2018-09-10 LAB — TACROLIMUS LEVEL: TACROLIMUS LVL: 7 ng/mL

## 2018-09-16 DIAGNOSIS — I10 Essential (primary) hypertension: Secondary | ICD-10-CM | POA: Diagnosis not present

## 2018-09-16 DIAGNOSIS — Z940 Kidney transplant status: Secondary | ICD-10-CM | POA: Diagnosis not present

## 2018-09-16 DIAGNOSIS — N183 Chronic kidney disease, stage 3 (moderate): Secondary | ICD-10-CM | POA: Diagnosis not present

## 2018-10-07 MED FILL — azaTHIOprine 50 MG TABS: 50 | 30 days supply | Qty: 60 | Fill #0

## 2018-10-07 MED FILL — TACROLIMUS 1 MG CAPSULE: 1 | 30 days supply | Qty: 60 | Fill #0

## 2018-10-07 MED FILL — predniSONE 5 MG TABS: 5 | 30 days supply | Qty: 30 | Fill #0

## 2018-10-07 MED FILL — ATOVAQUONE 750 MG/5ML SUSP: 750 | 30 days supply | Qty: 300 | Fill #0

## 2018-10-07 MED FILL — TAMSULOSIN HCL 0.4 MG CAP: 0.4 | 30 days supply | Qty: 30 | Fill #0

## 2018-10-07 MED FILL — FLUDROCORTISONE 0.1 MG TAB: 0.1 | 30 days supply | Qty: 60 | Fill #0

## 2018-10-07 MED FILL — VENTOLIN HFA 90 MCG INHALER: 108 (90 BAS | 25 days supply | Qty: 18 | Fill #0

## 2018-10-14 ENCOUNTER — Encounter: Payer: Self-pay | Admitting: Family Medicine

## 2018-10-18 ENCOUNTER — Other Ambulatory Visit: Payer: Self-pay

## 2018-10-18 ENCOUNTER — Other Ambulatory Visit: Payer: Medicare Other

## 2018-10-18 DIAGNOSIS — Z940 Kidney transplant status: Secondary | ICD-10-CM | POA: Diagnosis not present

## 2018-10-19 LAB — COMPLETE METABOLIC PANEL WITH GFR
AG Ratio: 1.8 (calc) (ref 1.0–2.5)
ALT: 18 U/L (ref 9–46)
AST: 17 U/L (ref 10–35)
Albumin: 3.9 g/dL (ref 3.6–5.1)
Alkaline phosphatase (APISO): 85 U/L (ref 35–144)
BUN/Creatinine Ratio: 12 (calc) (ref 6–22)
BUN: 19 mg/dL (ref 7–25)
CO2: 26 mmol/L (ref 20–32)
Calcium: 9.2 mg/dL (ref 8.6–10.3)
Chloride: 108 mmol/L (ref 98–110)
Creat: 1.61 mg/dL — ABNORMAL HIGH (ref 0.70–1.33)
GFR, Est African American: 55 mL/min/{1.73_m2} — ABNORMAL LOW (ref >=60)
GFR, Est Non African American: 47 mL/min/{1.73_m2} — ABNORMAL LOW (ref >=60)
Globulin: 2.2 g/dL (calc) (ref 1.9–3.7)
Glucose, Bld: 111 mg/dL — ABNORMAL HIGH (ref 65–99)
Potassium: 3.9 mmol/L (ref 3.5–5.3)
Sodium: 142 mmol/L (ref 135–146)
Total Bilirubin: 0.7 mg/dL (ref 0.2–1.2)
Total Protein: 6.1 g/dL (ref 6.1–8.1)

## 2018-10-19 LAB — CBC WITH DIFFERENTIAL/PLATELET
Absolute Monocytes: 548 cells/uL (ref 200–950)
Basophils Absolute: 73 cells/uL (ref 0–200)
Basophils Relative: 1.1 %
Eosinophils Absolute: 429 cells/uL (ref 15–500)
Eosinophils Relative: 6.5 %
HCT: 37.2 % — ABNORMAL LOW (ref 38.5–50.0)
Hemoglobin: 12.4 g/dL — ABNORMAL LOW (ref 13.2–17.1)
Lymphs Abs: 891 cells/uL (ref 850–3900)
MCH: 30.9 pg (ref 27.0–33.0)
MCHC: 33.3 g/dL (ref 32.0–36.0)
MCV: 92.8 fL (ref 80.0–100.0)
MPV: 11.1 fL (ref 7.5–12.5)
Monocytes Relative: 8.3 %
Neutro Abs: 4660 cells/uL (ref 1500–7800)
Neutrophils Relative %: 70.6 %
Platelets: 175 10*3/uL (ref 140–400)
RBC: 4.01 10*6/uL — ABNORMAL LOW (ref 4.20–5.80)
RDW: 15.6 % — ABNORMAL HIGH (ref 11.0–15.0)
Total Lymphocyte: 13.5 %
WBC: 6.6 10*3/uL (ref 3.8–10.8)

## 2018-10-19 LAB — PHOSPHORUS: Phosphorus: 4.1 mg/dL (ref 2.5–4.5)

## 2018-10-19 LAB — TACROLIMUS LEVEL: Tacrolimus (FK506), Blood: 9 ng/mL

## 2018-10-19 LAB — MAGNESIUM: Magnesium: 1.4 mg/dL — ABNORMAL LOW (ref 1.5–2.5)

## 2018-10-21 ENCOUNTER — Other Ambulatory Visit: Payer: Medicare Other

## 2018-10-21 MED FILL — DORZOLAMIDE-TIMOLOL EYE DRP: 22.3-6.8 | 10 days supply | Qty: 10 | Fill #0

## 2018-10-21 MED FILL — OFLOXACIN 0.3% EYE DROPS: 0.3 | 7 days supply | Qty: 5 | Fill #0

## 2018-10-25 MED FILL — TACROLIMUS 1 MG CAPSULE: 1 | 30 days supply | Qty: 90 | Fill #0

## 2018-10-28 DIAGNOSIS — E785 Hyperlipidemia, unspecified: Secondary | ICD-10-CM | POA: Diagnosis not present

## 2018-10-28 DIAGNOSIS — D649 Anemia, unspecified: Secondary | ICD-10-CM | POA: Diagnosis not present

## 2018-10-28 DIAGNOSIS — R197 Diarrhea, unspecified: Secondary | ICD-10-CM | POA: Diagnosis not present

## 2018-10-28 DIAGNOSIS — E859 Amyloidosis, unspecified: Secondary | ICD-10-CM | POA: Diagnosis not present

## 2018-10-28 DIAGNOSIS — N189 Chronic kidney disease, unspecified: Secondary | ICD-10-CM | POA: Diagnosis not present

## 2018-10-28 DIAGNOSIS — I129 Hypertensive chronic kidney disease with stage 1 through stage 4 chronic kidney disease, or unspecified chronic kidney disease: Secondary | ICD-10-CM | POA: Diagnosis not present

## 2018-10-28 DIAGNOSIS — Z940 Kidney transplant status: Secondary | ICD-10-CM | POA: Diagnosis not present

## 2018-10-28 DIAGNOSIS — E875 Hyperkalemia: Secondary | ICD-10-CM | POA: Diagnosis not present

## 2018-10-29 DIAGNOSIS — H3581 Retinal edema: Secondary | ICD-10-CM | POA: Diagnosis not present

## 2018-10-29 DIAGNOSIS — Z79899 Other long term (current) drug therapy: Secondary | ICD-10-CM | POA: Diagnosis not present

## 2018-10-29 DIAGNOSIS — H35713 Central serous chorioretinopathy, bilateral: Secondary | ICD-10-CM | POA: Diagnosis not present

## 2018-10-29 DIAGNOSIS — H353231 Exudative age-related macular degeneration, bilateral, with active choroidal neovascularization: Secondary | ICD-10-CM | POA: Diagnosis not present

## 2018-11-06 DIAGNOSIS — H35713 Central serous chorioretinopathy, bilateral: Secondary | ICD-10-CM | POA: Diagnosis not present

## 2018-11-06 DIAGNOSIS — H353231 Exudative age-related macular degeneration, bilateral, with active choroidal neovascularization: Secondary | ICD-10-CM | POA: Diagnosis not present

## 2018-11-08 MED FILL — TAMSULOSIN HCL 0.4 MG CAP: 0.4 | 30 days supply | Qty: 30 | Fill #1

## 2018-11-08 MED FILL — FLUDROCORTISONE 0.1 MG TAB: 0.1 | 10 days supply | Qty: 20 | Fill #1

## 2018-11-08 MED FILL — azaTHIOprine 50 MG TABS: 50 | 30 days supply | Qty: 60 | Fill #1

## 2018-11-08 MED FILL — TACROLIMUS 1 MG CAPSULE: 1 | 30 days supply | Qty: 90 | Fill #1

## 2018-11-13 MED FILL — ATORVASTATIN 20 MG TABLET: 20 | 30 days supply | Qty: 30 | Fill #0

## 2018-11-19 DIAGNOSIS — E852 Heredofamilial amyloidosis, unspecified: Secondary | ICD-10-CM | POA: Diagnosis not present

## 2018-11-19 DIAGNOSIS — Z992 Dependence on renal dialysis: Secondary | ICD-10-CM | POA: Diagnosis not present

## 2018-11-19 DIAGNOSIS — N08 Glomerular disorders in diseases classified elsewhere: Secondary | ICD-10-CM | POA: Diagnosis not present

## 2018-11-19 DIAGNOSIS — Z79899 Other long term (current) drug therapy: Secondary | ICD-10-CM | POA: Diagnosis not present

## 2018-11-19 DIAGNOSIS — E854 Organ-limited amyloidosis: Secondary | ICD-10-CM | POA: Diagnosis not present

## 2018-11-19 DIAGNOSIS — N186 End stage renal disease: Secondary | ICD-10-CM | POA: Diagnosis not present

## 2018-11-22 MED FILL — PANTOPRAZOLE SOD DR 40 MG T: 40 | 30 days supply | Qty: 30 | Fill #0

## 2018-11-25 MED FILL — DORZOLAMIDE-TIMOLOL EYE DRP: 22.3-6.8 | 90 days supply | Qty: 10 | Fill #1

## 2018-11-26 DIAGNOSIS — H35713 Central serous chorioretinopathy, bilateral: Secondary | ICD-10-CM | POA: Diagnosis not present

## 2018-11-26 DIAGNOSIS — H353231 Exudative age-related macular degeneration, bilateral, with active choroidal neovascularization: Secondary | ICD-10-CM | POA: Diagnosis not present

## 2018-11-26 DIAGNOSIS — H3581 Retinal edema: Secondary | ICD-10-CM | POA: Diagnosis not present

## 2018-11-26 DIAGNOSIS — Z79899 Other long term (current) drug therapy: Secondary | ICD-10-CM | POA: Diagnosis not present

## 2018-11-27 ENCOUNTER — Other Ambulatory Visit: Payer: Medicare Other

## 2018-11-27 ENCOUNTER — Other Ambulatory Visit: Payer: Self-pay

## 2018-11-27 DIAGNOSIS — Z940 Kidney transplant status: Secondary | ICD-10-CM

## 2018-11-28 LAB — MAGNESIUM: Magnesium: 1.6 mg/dL (ref 1.5–2.5)

## 2018-11-28 LAB — TACROLIMUS LEVEL: Tacrolimus (FK506), Blood: 9.5 ng/mL

## 2018-11-28 LAB — CBC WITH DIFFERENTIAL/PLATELET
Absolute Monocytes: 510 cells/uL (ref 200–950)
Basophils Absolute: 69 cells/uL (ref 0–200)
Basophils Relative: 1.1 %
Eosinophils Absolute: 359 cells/uL (ref 15–500)
Eosinophils Relative: 5.7 %
HCT: 39.9 % (ref 38.5–50.0)
Hemoglobin: 13.1 g/dL — ABNORMAL LOW (ref 13.2–17.1)
Lymphs Abs: 989 cells/uL (ref 850–3900)
MCH: 30.8 pg (ref 27.0–33.0)
MCHC: 32.8 g/dL (ref 32.0–36.0)
MCV: 93.7 fL (ref 80.0–100.0)
MPV: 11.2 fL (ref 7.5–12.5)
Monocytes Relative: 8.1 %
Neutro Abs: 4372 cells/uL (ref 1500–7800)
Neutrophils Relative %: 69.4 %
Platelets: 150 10*3/uL (ref 140–400)
RBC: 4.26 10*6/uL (ref 4.20–5.80)
RDW: 15.8 % — ABNORMAL HIGH (ref 11.0–15.0)
Total Lymphocyte: 15.7 %
WBC: 6.3 10*3/uL (ref 3.8–10.8)

## 2018-11-28 LAB — COMPLETE METABOLIC PANEL WITH GFR
AG Ratio: 1.8 (calc) (ref 1.0–2.5)
ALT: 20 U/L (ref 9–46)
AST: 22 U/L (ref 10–35)
Albumin: 4.3 g/dL (ref 3.6–5.1)
Alkaline phosphatase (APISO): 98 U/L (ref 35–144)
BUN/Creatinine Ratio: 17 (calc) (ref 6–22)
BUN: 33 mg/dL — ABNORMAL HIGH (ref 7–25)
CO2: 26 mmol/L (ref 20–32)
Calcium: 9.4 mg/dL (ref 8.6–10.3)
Chloride: 107 mmol/L (ref 98–110)
Creat: 1.93 mg/dL — ABNORMAL HIGH (ref 0.70–1.33)
GFR, Est African American: 44 mL/min/{1.73_m2} — ABNORMAL LOW (ref >=60)
GFR, Est Non African American: 38 mL/min/{1.73_m2} — ABNORMAL LOW (ref >=60)
Globulin: 2.4 g/dL (calc) (ref 1.9–3.7)
Glucose, Bld: 119 mg/dL — ABNORMAL HIGH (ref 65–99)
Potassium: 5 mmol/L (ref 3.5–5.3)
Sodium: 142 mmol/L (ref 135–146)
Total Bilirubin: 0.9 mg/dL (ref 0.2–1.2)
Total Protein: 6.7 g/dL (ref 6.1–8.1)

## 2018-11-28 LAB — PHOSPHORUS: Phosphorus: 4.2 mg/dL (ref 2.5–4.5)

## 2018-12-03 DIAGNOSIS — Z79899 Other long term (current) drug therapy: Secondary | ICD-10-CM | POA: Diagnosis not present

## 2018-12-03 DIAGNOSIS — Z87891 Personal history of nicotine dependence: Secondary | ICD-10-CM | POA: Diagnosis not present

## 2018-12-03 DIAGNOSIS — Z4822 Encounter for aftercare following kidney transplant: Secondary | ICD-10-CM | POA: Diagnosis not present

## 2018-12-03 DIAGNOSIS — Z940 Kidney transplant status: Secondary | ICD-10-CM | POA: Diagnosis not present

## 2018-12-03 DIAGNOSIS — N186 End stage renal disease: Secondary | ICD-10-CM | POA: Diagnosis not present

## 2018-12-03 DIAGNOSIS — I120 Hypertensive chronic kidney disease with stage 5 chronic kidney disease or end stage renal disease: Secondary | ICD-10-CM | POA: Diagnosis not present

## 2018-12-03 DIAGNOSIS — E854 Organ-limited amyloidosis: Secondary | ICD-10-CM | POA: Diagnosis not present

## 2018-12-12 MED FILL — TACROLIMUS 1 MG CAPSULE: 1 | 30 days supply | Qty: 90 | Fill #2

## 2018-12-12 MED FILL — predniSONE 5 MG TABS: 5 | 30 days supply | Qty: 30 | Fill #1

## 2018-12-12 MED FILL — TAMSULOSIN HCL 0.4 MG CAP: 0.4 | 30 days supply | Qty: 30 | Fill #2

## 2018-12-12 MED FILL — ATORVASTATIN 20 MG TABLET: 20 | 30 days supply | Qty: 30 | Fill #1

## 2018-12-12 MED FILL — PANTOPRAZOLE SOD DR 40 MG T: 40 | 30 days supply | Qty: 30 | Fill #1

## 2018-12-12 MED FILL — azaTHIOprine 50 MG TABS: 50 | 30 days supply | Qty: 60 | Fill #2

## 2018-12-12 MED FILL — FLUDROCORTISONE 0.1 MG TAB: 0.1 | 30 days supply | Qty: 60 | Fill #0

## 2018-12-18 ENCOUNTER — Other Ambulatory Visit: Payer: Self-pay | Admitting: Family Medicine

## 2018-12-18 NOTE — Telephone Encounter (Signed)
Ok to refill??  Last office visit/ refill 07/02/2018.

## 2018-12-19 DIAGNOSIS — N2581 Secondary hyperparathyroidism of renal origin: Secondary | ICD-10-CM | POA: Diagnosis not present

## 2018-12-19 DIAGNOSIS — N186 End stage renal disease: Secondary | ICD-10-CM | POA: Diagnosis not present

## 2018-12-19 DIAGNOSIS — Z940 Kidney transplant status: Secondary | ICD-10-CM | POA: Diagnosis not present

## 2018-12-19 DIAGNOSIS — N183 Chronic kidney disease, stage 3 (moderate): Secondary | ICD-10-CM | POA: Diagnosis not present

## 2018-12-19 DIAGNOSIS — I10 Essential (primary) hypertension: Secondary | ICD-10-CM | POA: Diagnosis not present

## 2018-12-19 DIAGNOSIS — D631 Anemia in chronic kidney disease: Secondary | ICD-10-CM | POA: Diagnosis not present

## 2018-12-24 DIAGNOSIS — H3581 Retinal edema: Secondary | ICD-10-CM | POA: Diagnosis not present

## 2018-12-24 DIAGNOSIS — Z79899 Other long term (current) drug therapy: Secondary | ICD-10-CM | POA: Diagnosis not present

## 2018-12-24 DIAGNOSIS — H353231 Exudative age-related macular degeneration, bilateral, with active choroidal neovascularization: Secondary | ICD-10-CM | POA: Diagnosis not present

## 2018-12-24 DIAGNOSIS — H35713 Central serous chorioretinopathy, bilateral: Secondary | ICD-10-CM | POA: Diagnosis not present

## 2019-01-15 MED FILL — TAMSULOSIN HCL 0.4 MG CAP: 0.4 | 30 days supply | Qty: 30 | Fill #0

## 2019-01-15 MED FILL — predniSONE 5 MG TABS: 5 | 30 days supply | Qty: 30 | Fill #2

## 2019-01-15 MED FILL — TACROLIMUS 1 MG CAPSULE: 1 | 30 days supply | Qty: 90 | Fill #3

## 2019-01-15 MED FILL — FLUDROCORTISONE 0.1 MG TAB: 0.1 | 30 days supply | Qty: 60 | Fill #1

## 2019-01-15 MED FILL — ATORVASTATIN 20 MG TABLET: 20 | 30 days supply | Qty: 30 | Fill #2

## 2019-01-15 MED FILL — PANTOPRAZOLE SOD DR 40 MG T: 40 | 30 days supply | Qty: 30 | Fill #2

## 2019-01-15 MED FILL — azaTHIOprine 50 MG TABS: 50 | 30 days supply | Qty: 60 | Fill #0

## 2019-01-21 DIAGNOSIS — H353231 Exudative age-related macular degeneration, bilateral, with active choroidal neovascularization: Secondary | ICD-10-CM | POA: Diagnosis not present

## 2019-01-21 DIAGNOSIS — H35713 Central serous chorioretinopathy, bilateral: Secondary | ICD-10-CM | POA: Diagnosis not present

## 2019-01-22 DIAGNOSIS — Z79899 Other long term (current) drug therapy: Secondary | ICD-10-CM | POA: Diagnosis not present

## 2019-01-22 DIAGNOSIS — H353231 Exudative age-related macular degeneration, bilateral, with active choroidal neovascularization: Secondary | ICD-10-CM | POA: Diagnosis not present

## 2019-01-22 DIAGNOSIS — Z961 Presence of intraocular lens: Secondary | ICD-10-CM | POA: Diagnosis not present

## 2019-01-22 DIAGNOSIS — H35713 Central serous chorioretinopathy, bilateral: Secondary | ICD-10-CM | POA: Diagnosis not present

## 2019-02-03 DIAGNOSIS — Z01818 Encounter for other preprocedural examination: Secondary | ICD-10-CM | POA: Diagnosis not present

## 2019-02-04 DIAGNOSIS — R197 Diarrhea, unspecified: Secondary | ICD-10-CM | POA: Diagnosis not present

## 2019-02-04 DIAGNOSIS — Z01818 Encounter for other preprocedural examination: Secondary | ICD-10-CM | POA: Diagnosis not present

## 2019-02-04 DIAGNOSIS — N186 End stage renal disease: Secondary | ICD-10-CM | POA: Diagnosis not present

## 2019-02-04 DIAGNOSIS — Z4822 Encounter for aftercare following kidney transplant: Secondary | ICD-10-CM | POA: Diagnosis not present

## 2019-02-04 DIAGNOSIS — E854 Organ-limited amyloidosis: Secondary | ICD-10-CM | POA: Diagnosis not present

## 2019-02-04 DIAGNOSIS — Z940 Kidney transplant status: Secondary | ICD-10-CM | POA: Diagnosis not present

## 2019-02-04 DIAGNOSIS — Z79899 Other long term (current) drug therapy: Secondary | ICD-10-CM | POA: Diagnosis not present

## 2019-02-04 DIAGNOSIS — I120 Hypertensive chronic kidney disease with stage 5 chronic kidney disease or end stage renal disease: Secondary | ICD-10-CM | POA: Diagnosis not present

## 2019-02-04 DIAGNOSIS — Z87891 Personal history of nicotine dependence: Secondary | ICD-10-CM | POA: Diagnosis not present

## 2019-02-05 DIAGNOSIS — Z01818 Encounter for other preprocedural examination: Secondary | ICD-10-CM | POA: Diagnosis not present

## 2019-02-06 DIAGNOSIS — Z01818 Encounter for other preprocedural examination: Secondary | ICD-10-CM | POA: Diagnosis not present

## 2019-02-07 MED FILL — TACROLIMUS 1 MG CAPSULE: 1 | 30 days supply | Qty: 90 | Fill #0

## 2019-02-07 MED FILL — TACROLIMUS 1 MG CAPSULE: 1 | 30 days supply | Qty: 90 | Fill #1

## 2019-02-08 MED FILL — PANTOPRAZOLE SOD DR 40 MG T: 40 | 90 days supply | Qty: 90 | Fill #0

## 2019-02-08 MED FILL — predniSONE 5 MG TABS: 5 | 90 days supply | Qty: 90 | Fill #0

## 2019-02-08 MED FILL — AZATHIOPRINE 50 MG TABS: 50 | 90 days supply | Qty: 180 | Fill #0

## 2019-02-08 MED FILL — FLUDROCORTISONE 0.1 MG TAB: 0.1 | 90 days supply | Qty: 180 | Fill #0

## 2019-02-08 MED FILL — ATORVASTATIN 20 MG TABLET: 20 | 90 days supply | Qty: 90 | Fill #0

## 2019-02-08 MED FILL — TAMSULOSIN HCL 0.4 MG CAP: 0.4 | 90 days supply | Qty: 90 | Fill #0

## 2019-02-10 ENCOUNTER — Telehealth: Payer: Self-pay | Admitting: *Deleted

## 2019-02-10 NOTE — Telephone Encounter (Signed)
Received call from [place].   Reports that patient is in pharmacy and requesting Influenza HD immunization. States that since patient is <55yo, pharmacy protocol cannot be used.   Per CDC, Influenza HD Vaccine is only approved for persons >55yo. Advised that insurance will not cover HD immunization, but patient may pay out of pocket for shot.

## 2019-02-13 DIAGNOSIS — Z940 Kidney transplant status: Secondary | ICD-10-CM | POA: Diagnosis not present

## 2019-02-18 DIAGNOSIS — H353231 Exudative age-related macular degeneration, bilateral, with active choroidal neovascularization: Secondary | ICD-10-CM | POA: Diagnosis not present

## 2019-02-18 DIAGNOSIS — H35713 Central serous chorioretinopathy, bilateral: Secondary | ICD-10-CM | POA: Diagnosis not present

## 2019-02-20 MED FILL — DORZOLAMIDE HCL-TIMOLOL MAL: 22.3-6.8 | 90 days supply | Qty: 10 | Fill #2

## 2019-02-26 DIAGNOSIS — H3323 Serous retinal detachment, bilateral: Secondary | ICD-10-CM | POA: Diagnosis not present

## 2019-03-04 DIAGNOSIS — R197 Diarrhea, unspecified: Secondary | ICD-10-CM | POA: Diagnosis not present

## 2019-03-04 DIAGNOSIS — Z79899 Other long term (current) drug therapy: Secondary | ICD-10-CM | POA: Diagnosis not present

## 2019-03-04 DIAGNOSIS — Z940 Kidney transplant status: Secondary | ICD-10-CM | POA: Diagnosis not present

## 2019-03-04 DIAGNOSIS — N186 End stage renal disease: Secondary | ICD-10-CM | POA: Diagnosis not present

## 2019-03-04 DIAGNOSIS — Z4822 Encounter for aftercare following kidney transplant: Secondary | ICD-10-CM | POA: Diagnosis not present

## 2019-03-04 DIAGNOSIS — Z87891 Personal history of nicotine dependence: Secondary | ICD-10-CM | POA: Diagnosis not present

## 2019-03-05 ENCOUNTER — Other Ambulatory Visit: Payer: Medicare Other

## 2019-03-05 ENCOUNTER — Other Ambulatory Visit: Payer: Self-pay

## 2019-03-05 DIAGNOSIS — Z940 Kidney transplant status: Secondary | ICD-10-CM

## 2019-03-06 LAB — COMPLETE METABOLIC PANEL WITH GFR
AG Ratio: 1.7 (calc) (ref 1.0–2.5)
ALT: 21 U/L (ref 9–46)
AST: 27 U/L (ref 10–35)
Albumin: 4.3 g/dL (ref 3.6–5.1)
Alkaline phosphatase (APISO): 90 U/L (ref 35–144)
BUN/Creatinine Ratio: 16 (calc) (ref 6–22)
BUN: 33 mg/dL — ABNORMAL HIGH (ref 7–25)
CO2: 23 mmol/L (ref 20–32)
Calcium: 9.7 mg/dL (ref 8.6–10.3)
Chloride: 107 mmol/L (ref 98–110)
Creat: 2.08 mg/dL — ABNORMAL HIGH (ref 0.70–1.33)
GFR, Est African American: 40 mL/min/{1.73_m2} — ABNORMAL LOW (ref >=60)
GFR, Est Non African American: 35 mL/min/{1.73_m2} — ABNORMAL LOW (ref >=60)
Globulin: 2.6 g/dL (calc) (ref 1.9–3.7)
Glucose, Bld: 133 mg/dL — ABNORMAL HIGH (ref 65–99)
Potassium: 5.7 mmol/L — ABNORMAL HIGH (ref 3.5–5.3)
Sodium: 140 mmol/L (ref 135–146)
Total Bilirubin: 1.1 mg/dL (ref 0.2–1.2)
Total Protein: 6.9 g/dL (ref 6.1–8.1)

## 2019-03-06 LAB — CBC WITH DIFFERENTIAL/PLATELET
Absolute Monocytes: 338 cells/uL (ref 200–950)
Basophils Absolute: 52 cells/uL (ref 0–200)
Basophils Relative: 0.8 %
Eosinophils Absolute: 78 cells/uL (ref 15–500)
Eosinophils Relative: 1.2 %
HCT: 42.7 % (ref 38.5–50.0)
Hemoglobin: 14 g/dL (ref 13.2–17.1)
Lymphs Abs: 533 cells/uL — ABNORMAL LOW (ref 850–3900)
MCH: 30.1 pg (ref 27.0–33.0)
MCHC: 32.8 g/dL (ref 32.0–36.0)
MCV: 91.8 fL (ref 80.0–100.0)
MPV: 11.2 fL (ref 7.5–12.5)
Monocytes Relative: 5.2 %
Neutro Abs: 5499 cells/uL (ref 1500–7800)
Neutrophils Relative %: 84.6 %
Platelets: 192 10*3/uL (ref 140–400)
RBC: 4.65 10*6/uL (ref 4.20–5.80)
RDW: 15.6 % — ABNORMAL HIGH (ref 11.0–15.0)
Total Lymphocyte: 8.2 %
WBC: 6.5 10*3/uL (ref 3.8–10.8)

## 2019-03-06 LAB — MAGNESIUM: Magnesium: 1.5 mg/dL (ref 1.5–2.5)

## 2019-03-06 LAB — PHOSPHORUS: Phosphorus: 3.4 mg/dL (ref 2.5–4.5)

## 2019-03-06 LAB — TACROLIMUS LEVEL: Tacrolimus (FK506), Blood: 11.3 ng/mL

## 2019-04-19 ENCOUNTER — Encounter: Payer: Self-pay | Admitting: Family Medicine

## 2020-08-06 ENCOUNTER — Telehealth: Payer: Self-pay | Admitting: Cardiovascular Disease

## 2020-08-06 NOTE — Telephone Encounter (Signed)
3 attempts to schedule fu appt from recall list.   Deleting recall.   Moved to [place]
